# Patient Record
Sex: Female | Born: 1950 | Race: White | Hispanic: No | Marital: Married
Health system: Midwestern US, Community
[De-identification: ages and names within clinical notes are randomized; demographics above are authoritative.]

## PROBLEM LIST (undated history)

## (undated) DIAGNOSIS — I671 Cerebral aneurysm, nonruptured: Secondary | ICD-10-CM

## (undated) DIAGNOSIS — E119 Type 2 diabetes mellitus without complications: Secondary | ICD-10-CM

## (undated) DIAGNOSIS — M1990 Unspecified osteoarthritis, unspecified site: Secondary | ICD-10-CM

## (undated) DIAGNOSIS — G43909 Migraine, unspecified, not intractable, without status migrainosus: Secondary | ICD-10-CM

## (undated) DIAGNOSIS — J45909 Unspecified asthma, uncomplicated: Secondary | ICD-10-CM

## (undated) DIAGNOSIS — F419 Anxiety disorder, unspecified: Secondary | ICD-10-CM

## (undated) DIAGNOSIS — I10 Essential (primary) hypertension: Secondary | ICD-10-CM

## (undated) DIAGNOSIS — R011 Cardiac murmur, unspecified: Secondary | ICD-10-CM

## (undated) DIAGNOSIS — M810 Age-related osteoporosis without current pathological fracture: Secondary | ICD-10-CM

## (undated) DIAGNOSIS — R42 Dizziness and giddiness: Secondary | ICD-10-CM

## (undated) DIAGNOSIS — K449 Diaphragmatic hernia without obstruction or gangrene: Secondary | ICD-10-CM

## (undated) DIAGNOSIS — T7840XA Allergy, unspecified, initial encounter: Secondary | ICD-10-CM

## (undated) DIAGNOSIS — K219 Gastro-esophageal reflux disease without esophagitis: Secondary | ICD-10-CM

## (undated) DIAGNOSIS — H269 Unspecified cataract: Secondary | ICD-10-CM

## (undated) DIAGNOSIS — F32A Depression, unspecified: Secondary | ICD-10-CM

## (undated) DIAGNOSIS — I5189 Other ill-defined heart diseases: Secondary | ICD-10-CM

## (undated) DIAGNOSIS — I499 Cardiac arrhythmia, unspecified: Secondary | ICD-10-CM

## (undated) DIAGNOSIS — E785 Hyperlipidemia, unspecified: Secondary | ICD-10-CM

## (undated) DIAGNOSIS — G4733 Obstructive sleep apnea (adult) (pediatric): Secondary | ICD-10-CM

## (undated) DIAGNOSIS — G4730 Sleep apnea, unspecified: Secondary | ICD-10-CM

## (undated) DIAGNOSIS — E1140 Type 2 diabetes mellitus with diabetic neuropathy, unspecified: Secondary | ICD-10-CM

## (undated) DIAGNOSIS — K900 Celiac disease: Secondary | ICD-10-CM

## (undated) DIAGNOSIS — E1142 Type 2 diabetes mellitus with diabetic polyneuropathy: Secondary | ICD-10-CM

## (undated) DIAGNOSIS — Z1211 Encounter for screening for malignant neoplasm of colon: Secondary | ICD-10-CM

## (undated) DIAGNOSIS — G43719 Chronic migraine without aura, intractable, without status migrainosus: Secondary | ICD-10-CM

## (undated) DIAGNOSIS — Z1231 Encounter for screening mammogram for malignant neoplasm of breast: Secondary | ICD-10-CM

## (undated) DIAGNOSIS — E1165 Type 2 diabetes mellitus with hyperglycemia: Secondary | ICD-10-CM

## (undated) HISTORY — PX: CARPAL TUNNEL RELEASE: SHX101

## (undated) HISTORY — DX: Other ill-defined heart diseases: I51.89

## (undated) HISTORY — DX: Allergy, unspecified, initial encounter: T78.40XA

## (undated) HISTORY — PX: LAPAROSCOPY: SHX197

## (undated) HISTORY — DX: Unspecified asthma, uncomplicated: J45.909

## (undated) HISTORY — DX: Age-related osteoporosis without current pathological fracture: M81.0

## (undated) HISTORY — DX: Essential (primary) hypertension: I10

## (undated) HISTORY — PX: EYE SURGERY: SHX253

## (undated) HISTORY — DX: Unspecified cataract: H26.9

## (undated) HISTORY — PX: BRAIN SURGERY: SHX531

## (undated) HISTORY — DX: Diaphragmatic hernia without obstruction or gangrene: K44.9

## (undated) HISTORY — PX: BREAST BIOPSY: SHX20

## (undated) HISTORY — DX: Sleep apnea, unspecified: G47.30

## (undated) HISTORY — PX: WRIST SURGERY: SHX841

## (undated) HISTORY — PX: UTERINE FIBROID SURGERY: SHX826

## (undated) HISTORY — DX: Hyperlipidemia, unspecified: E78.5

## (undated) HISTORY — DX: Obstructive sleep apnea (adult) (pediatric): G47.33

## (undated) HISTORY — DX: Migraine, unspecified, not intractable, without status migrainosus: G43.909

## (undated) HISTORY — DX: Cardiac murmur, unspecified: R01.1

## (undated) HISTORY — PX: TUBAL LIGATION: SHX77

## (undated) HISTORY — PX: JOINT REPLACEMENT: SHX530

## (undated) HISTORY — PX: CATARACT EXTRACTION: SUR2

## (undated) HISTORY — DX: Type 2 diabetes mellitus with diabetic neuropathy, unspecified: E11.40

## (undated) HISTORY — DX: Celiac disease: K90.0

## (undated) HISTORY — PX: APPENDECTOMY: SHX54

## (undated) HISTORY — DX: Unspecified osteoarthritis, unspecified site: M19.90

## (undated) HISTORY — DX: Dizziness and giddiness: R42

## (undated) HISTORY — DX: Type 2 diabetes mellitus without complications: E11.9

---
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
## 2003-09-29 HISTORY — PX: ABDOMINAL HYSTERECTOMY: SHX81

## 2008-09-16 DIAGNOSIS — E1140 Type 2 diabetes mellitus with diabetic neuropathy, unspecified: Secondary | ICD-10-CM | POA: Insufficient documentation

## 2008-09-16 DIAGNOSIS — M810 Age-related osteoporosis without current pathological fracture: Secondary | ICD-10-CM | POA: Insufficient documentation

## 2012-11-06 DIAGNOSIS — S62109A Fracture of unspecified carpal bone, unspecified wrist, initial encounter for closed fracture: Secondary | ICD-10-CM | POA: Insufficient documentation

## 2013-12-31 DIAGNOSIS — M542 Cervicalgia: Secondary | ICD-10-CM | POA: Insufficient documentation

## 2014-01-26 DIAGNOSIS — R262 Difficulty in walking, not elsewhere classified: Secondary | ICD-10-CM | POA: Insufficient documentation

## 2014-01-26 DIAGNOSIS — M6281 Muscle weakness (generalized): Secondary | ICD-10-CM | POA: Insufficient documentation

## 2014-01-26 DIAGNOSIS — M5137 Other intervertebral disc degeneration, lumbosacral region: Secondary | ICD-10-CM | POA: Insufficient documentation

## 2015-09-27 DIAGNOSIS — J45909 Unspecified asthma, uncomplicated: Secondary | ICD-10-CM | POA: Insufficient documentation

## 2015-09-27 DIAGNOSIS — G43009 Migraine without aura, not intractable, without status migrainosus: Secondary | ICD-10-CM | POA: Insufficient documentation

## 2015-09-27 DIAGNOSIS — M1990 Unspecified osteoarthritis, unspecified site: Secondary | ICD-10-CM | POA: Insufficient documentation

## 2015-10-16 DIAGNOSIS — E785 Hyperlipidemia, unspecified: Secondary | ICD-10-CM | POA: Insufficient documentation

## 2015-10-24 DIAGNOSIS — I671 Cerebral aneurysm, nonruptured: Secondary | ICD-10-CM | POA: Insufficient documentation

## 2015-10-24 HISTORY — PX: CRANIOTOMY FOR ANEURYSM / VERTEBROBASILAR / CAROTID CIRCULATION: SUR331

## 2016-05-24 ENCOUNTER — Encounter

## 2016-05-28 ENCOUNTER — Inpatient Hospital Stay: Admit: 2016-05-28 | Payer: PRIVATE HEALTH INSURANCE | Attending: Family Medicine | Primary: Family Medicine

## 2016-05-28 DIAGNOSIS — Z1231 Encounter for screening mammogram for malignant neoplasm of breast: Secondary | ICD-10-CM

## 2016-10-07 ENCOUNTER — Encounter

## 2016-10-14 ENCOUNTER — Ambulatory Visit

## 2016-10-14 ENCOUNTER — Inpatient Hospital Stay: Admit: 2016-10-14 | Payer: PRIVATE HEALTH INSURANCE | Primary: Family Medicine

## 2016-10-14 ENCOUNTER — Encounter

## 2016-10-14 DIAGNOSIS — I671 Cerebral aneurysm, nonruptured: Secondary | ICD-10-CM

## 2016-10-14 MED ORDER — GADOBENATE DIMEGLUMINE 529 MG/ML (0.1 MMOL/0.2 ML) IV
529 mg/mL (0.1mmol/0.2mL) | Freq: Once | INTRAVENOUS | Status: AC
Start: 2016-10-14 — End: 2016-10-14
  Administered 2016-10-14: 13:00:00 via INTRAVENOUS

## 2017-09-02 NOTE — Other (Signed)
[facility]  Amb[facility] Surgery Unit  Pre-operative Instructions for Endo Procedures    Procedure Date  Thursday, September 18, 2017            Tentative Arrival Time 0700      1. On the day of your procedure, please report to the Ambulatory Surgery Unit Registration Desk and sign in at your designated time. The Ambulatory Surgery Unit is located in [facility] on the [place] side of the hospital across from the [facility] building. Please have all of your health insurance cards and a photo ID.    2. You must have someone with you to drive you home, as you should not drive a car for 24 hours following anesthesia. Please make arrangements for a responsible adult friend or family member to stay with you for at least the first 24 hours after your procedure.    3. Do not have anything to eat or drink (including water, gum, mints, coffee, juice) after 11:59 PM, Wednesday. This may not apply to medications prescribed by your physician.  (Please note below the special instructions with medications to take the morning of your procedure.)    4. If applicable, follow the clear liquid diet and bowel prep instructions provided by your physician's office. If you do not have this information, or have any questions, please contact your physician's office.     5. We recommend you do not drink any alcoholic beverages for 24 hours before and after your procedure.    6. Contact your surgeon???s office for instructions on the following medications: non-steroidal anti-inflammatory drugs (i.e. Advil, Aleve), vitamins, and supplements. (Some surgeon???s will want you to stop these medications prior to surgery and others may allow you to take them)   **If you are currently taking Plavix, Coumadin, Aspirin and/or other blood-thinning agents, contact your surgeon for instructions.** Your surgeon will partner with the physician prescribing these medications to  determine if it is safe to stop or if you need to continue taking. Please do not stop taking these medications without instructions from your surgeon.     7. In an effort to help prevent surgical site infection, we ask that you shower with an anti-bacterial soap (i.e. Dial or Safeguard) on the morning of your procedure. Do not apply any lotions, powders, or deodorants after showering.    8. Wear comfortable clothes. Wear glasses instead of contacts. Do not bring any jewelry or money (other than copays or fees as instructed). Do not wear make-up, particularly mascara, the morning of your procedure. Wear your hair loose or down, no ponytails, buns, bobby pins or clips. All body piercings must be removed.      9. You should understand that if you do not follow these instructions your procedure may be cancelled. If your physical condition changes (i.e. fever, cold or flu) please contact your surgeon as soon as possible.    10. It is important that you be on time. If a situation occurs where you may be late, or if you have any questions or problems, please call [phone].    11. Your procedure time may be subject to change. You will receive a phone call the day prior to confirm your arrival time.      Special Instructions:    Take all medications and inhalers, as prescribed, on the morning of surgery with a sip of water.    I understand a pre-operative phone call will be made to verify my procedure time. In  the event that I am not available, I give permission for a message to be left on my answering service and/or with another person?      yes    Preop instructions reviewed  Pt verbalized understanding.      ___________________      ___________________      ___________________  (Signature of Patient)          (Witness)                   (Date and Time)

## 2017-09-16 HISTORY — PX: ESOPHAGOGASTRODUODENOSCOPY: SHX1529

## 2017-09-16 HISTORY — PX: COLONOSCOPY: SHX174

## 2017-09-18 ENCOUNTER — Inpatient Hospital Stay: Payer: PRIVATE HEALTH INSURANCE

## 2017-09-18 LAB — HM COLONOSCOPY

## 2017-09-18 MED ORDER — LACTATED RINGERS IV
INTRAVENOUS | Status: DC
Start: 2017-09-18 — End: 2017-09-18

## 2017-09-18 MED ORDER — MEPERIDINE (PF) 50 MG/ML INJ SOLN
50 mg/ml | INTRAMUSCULAR | Status: DC | PRN
Start: 2017-09-18 — End: 2017-09-18

## 2017-09-18 MED ORDER — LIDOCAINE (PF) 20 MG/ML (2 %) IJ SOLN
20 mg/mL (2 %) | INTRAMUSCULAR | Status: DC | PRN
Start: 2017-09-18 — End: 2017-09-18
  Administered 2017-09-18: 13:00:00 via INTRAVENOUS

## 2017-09-18 MED ORDER — PROPOFOL 10 MG/ML IV EMUL
10 mg/mL | INTRAVENOUS | Status: AC
Start: 2017-09-18 — End: ?

## 2017-09-18 MED ORDER — PROPOFOL 10 MG/ML IV EMUL
10 mg/mL | INTRAVENOUS | Status: DC | PRN
Start: 2017-09-18 — End: 2017-09-18
  Administered 2017-09-18 (×8): via INTRAVENOUS

## 2017-09-18 MED ORDER — LIDOCAINE (PF) 10 MG/ML (1 %) IJ SOLN
10 mg/mL (1 %) | INTRAMUSCULAR | Status: DC | PRN
Start: 2017-09-18 — End: 2017-09-18

## 2017-09-18 MED ORDER — DIPHENHYDRAMINE HCL 50 MG/ML IJ SOLN
50 mg/mL | INTRAMUSCULAR | Status: DC | PRN
Start: 2017-09-18 — End: 2017-09-18

## 2017-09-18 MED ORDER — FENTANYL CITRATE (PF) 50 MCG/ML IJ SOLN
50 mcg/mL | INTRAMUSCULAR | Status: DC | PRN
Start: 2017-09-18 — End: 2017-09-18

## 2017-09-18 MED ORDER — LACTATED RINGERS IV
INTRAVENOUS | Status: DC
Start: 2017-09-18 — End: 2017-09-18
  Administered 2017-09-18: 12:00:00 via INTRAVENOUS

## 2017-09-18 MED ORDER — ONDANSETRON (PF) 4 MG/2 ML INJECTION
4 mg/2 mL | INTRAMUSCULAR | Status: DC | PRN
Start: 2017-09-18 — End: 2017-09-18

## 2017-09-18 MED ORDER — LIDOCAINE (PF) 20 MG/ML (2 %) IJ SOLN
20 mg/mL (2 %) | INTRAMUSCULAR | Status: AC
Start: 2017-09-18 — End: ?

## 2017-09-18 MED FILL — DIPRIVAN 10 MG/ML INTRAVENOUS EMULSION: 10 mg/mL | INTRAVENOUS | Qty: 20

## 2017-09-18 MED FILL — DIPRIVAN 10 MG/ML INTRAVENOUS EMULSION: 10 mg/mL | INTRAVENOUS | Qty: 100

## 2017-09-18 MED FILL — LACTATED RINGERS IV: INTRAVENOUS | Qty: 1000

## 2017-09-18 MED FILL — LIDOCAINE (PF) 20 MG/ML (2 %) IJ SOLN: 20 mg/mL (2 %) | INTRAMUSCULAR | Qty: 10

## 2017-09-18 NOTE — Procedures (Signed)
Procedures  by [name], MD at 09/18/17 [phone]                Author: [name], MD  Service: Gastroenterology  Author Type: Physician       Filed: 09/18/17 0847  Date of Service: 09/18/17 0846  Status: Signed          Editor: [name], MD (Physician)            Pre-procedure Diagnoses        1. Screening for colon cancer [Z12.11]                           Post-procedure Diagnoses        1. Polyp of descending colon, unspecified type [D12.4]        2. Diverticulosis of colon without hemorrhage [K57.30]        3. Internal hemorrhoids [K64.8]                           Procedures        1. Herbert Seta [[id number]]                                         Colonoscopy Procedure Note      Indications:   Screening colonoscopy      Referring Physician: Loraine Maple, DO   Anesthesia/Sedation: MAC anesthesia Propofol   Endoscopist:  Dr. Zane Herald      Procedure in Detail:   Informed consent was obtained for the procedure, including sedation.  Risks of perforation, hemorrhage, adverse drug reaction, and aspiration were discussed. The patient was placed in the left lateral decubitus position.  Based on the pre-procedure assessment,  including review of the patient's medical history, medications, allergies, and review of systems, she had been deemed to be an appropriate candidate  for moderate sedation; she was therefore sedated with the medications listed above.   The patient was monitored continuously with ECG tracing,  pulse oximetry, blood pressure monitoring, and direct observations.        A rectal examination was performed. The CFQ180AL was inserted into the rectum and advanced under direct vision to the cecum, which was identified by the ileocecal valve and appendiceal orifice.  The quality of the colonic preparation was adequate.  A  careful inspection was made as the colonoscope was withdrawn, including a retroflexed view of the rectum; findings and interventions  are described below.  Appropriate photodocumentation was obtained.      Findings:       1.  Scope advanced to the cecum.   2.  Preparation was adequate.   3.  Sessile 5 mm polyp in the descending colon s/p cold snare removal.   4.  Mild sigmoid diverticulosis.   5.  Grade 2 internal hemorrhoids.      Therapies:   As above      Specimen: Specimens were collected as described above and sent to pathology.       Complications: None were encountered during the procedure .       EBL: < 10 ml.      Recommendations:    -f/u path   -repeat Colonoscopy in 5 years   Signed By: [name], MD  September 18, 2017

## 2017-09-18 NOTE — Procedures (Signed)
Procedures  by [name], MD at 09/18/17 [phone]                Author: [name], MD  Service: Gastroenterology  Author Type: Physician       Filed: 09/18/17 0846  Date of Service: 09/18/17 0843  Status: Signed          Editor: [name], MD (Physician)            Pre-procedure Diagnoses        1. Barrett's esophagus without dysplasia [K22.70]                           Post-procedure Diagnoses        1. Barrett's esophagus determined by biopsy [K22.70]        2. Hiatal hernia [K44.9]        3. Gastritis without bleeding, unspecified chronicity, unspecified gastritis type [K29.70]                           Procedures        1. UPPER GI ENDOSCOPY,BIOPSY [[id number]]                                        Esophagogastroduodenoscopy Procedure Note         Kayla Shaffer   14-Aug-1951   [id number]      Indication:  Barrett's/esophageal ulcer       Endoscopist: [name], MD      Referring Provider:  Loraine Maple, DO      Sedation:  MAC anesthesia Propofol      Procedure Details:   After infomed consent was obtained for the procedure, with all risks and benefits of procedure explained the patient was taken to the endoscopy suite and placed in the left lateral decubitus position.  Following sequential administration of sedation as  per above, the endoscope was inserted into the mouth and advanced under direct vision to second portion of the duodenum.  A careful inspection was made as the gastroscope was withdrawn, including a retroflexed view of the proximal stomach; findings and  interventions are described below.        Findings:       Esophagus:    + Irregular Z line located at 35 cm from the incisors s/p Bx c/w previously diagnosed Barrett's Esophagus.  No nodules seen.   + Decreased LES tone with 4 cm hiatal hernia.   + S/P mid esophageal bx.      Stomach:    + There was erythema in the antrum s/p Bx.      Duodenum:    - The bulb and post bulbar mucosa is normal in appearance  to the second portion. The duodenal folds appeared normal.  Cold forceps biopsies to r/o celiac.       Therapies:  As above      Specimen: Specimens were collected as described and send to the laboratory.             Complications:   None were encountered during the procedure.      EBL: < 10 ml.            Recommendations:    -f/u path to confirm Barrett's   -repeat EGD in 3 years  [name], MD   09/18/2017  8:44 AM

## 2017-09-18 NOTE — Anesthesia Pre-Procedure Evaluation (Signed)
Anesthetic History   No history of anesthetic complications            Review of Systems / Medical History  Patient summary reviewed, nursing notes reviewed and pertinent labs reviewed    Pulmonary            Asthma : well controlled       Neuro/Psych         Headaches (migraines)    Comments: H/o intracranial aneurysm clipping 2017 Cardiovascular                  Exercise tolerance: >4 METS     GI/Hepatic/Renal     GERD      Hiatal hernia    Comments: Barrett's Endo/Other    Diabetes ( not on med)    Arthritis     Other Findings              Physical Exam    Airway  Mallampati: II  TM Distance: 4 - 6 cm  Neck ROM: normal range of motion   Mouth opening: Normal     Cardiovascular    Rhythm: regular  Rate: normal      Pertinent negatives: No murmur   Dental    Dentition: Bridges and Caps/crowns     Pulmonary  Breath sounds clear to auscultation               Abdominal  GI exam deferred       Other Findings            Anesthetic Plan    ASA: 2  Anesthesia type: total IV anesthesia and MAC          Induction: Intravenous  Anesthetic plan and risks discussed with: Patient

## 2017-09-18 NOTE — Other (Signed)
Kayla SheriffLucinda M Shaffer  06/14/1951  [id number]    Situation:  Verbal report given from: Z. Talmadge Coventryhornton, RN & K. Satterwhite,CRNA  Procedure: Procedure(s):  COLONOSCOPY/EGD  ESOPHAGOGASTRODUODENOSCOPY (EGD)  ESOPHAGOGASTRODUODENAL (EGD) BIOPSY  COLONOSCOPY WITH POLYPECTOMY    Background:    Preoperative diagnosis: BARRETTS/GERD/HIATAL HERNIA/LEFT UPPER QUADRANT PAIN    Postoperative diagnosis: Diverticulosis, internal hemorrhoids, descending colon polyp    Operator:  Dr. Teressa LowerManetas    Assistant(s): Circ-1: Aleatha Borerhornton, Zimeka L, RN  Circ-2: Evelena AsaMechuta, Laura J, RN  Scrub Tech-1: Birdena JubileeMuniz, Zulma L.    Specimens:   ID Type Source Tests Collected by Time Destination   1 : Stomach biopsy Preservative Stomach  Blake DivineManetas, Michael S, MD 09/18/2017 [phone] Pathology   2 : Duodenum biopsy Preservative Duodenum  Blake DivineManetas, Michael S, MD 09/18/2017 0820 Pathology   3 : GE junction biopsy Preservative GE Junction  Blake DivineManetas, Michael S, MD 09/18/2017 [phone] Pathology   4 : Mid esophagus biopsy Preservative Esophagus, Mid  Blake DivineManetas, Michael S, MD 09/18/2017 0825 Pathology   5 : Descending colon polyp Preservative Colon, Descending  Blake DivineManetas, Michael S, MD 09/18/2017 [phone] Pathology       Assessment:  Intra-procedure medications         Anesthesia gave intra-procedure sedation and medications, see anesthesia flow sheet     Intravenous fluids: LR@ KVO     Vital signs stable       Recommendation:    Permission to share finding with husband, Kayla SailsHarry.

## 2017-09-18 NOTE — Procedures (Signed)
Esophagogastroduodenoscopy Procedure Note      CASIDEE JANN  1951-07-14  [id number]    Indication:  Barrett's/esophageal ulcer     Endoscopist: [name], MD    Referring Provider:  Loraine Maple, DO    Sedation:  MAC anesthesia Propofol    Procedure Details:  After infomed consent was obtained for the procedure, with all risks and benefits of procedure explained the patient was taken to the endoscopy suite and placed in the left lateral decubitus position.  Following sequential administration of sedation as per above, the endoscope was inserted into the mouth and advanced under direct vision to second portion of the duodenum.  A careful inspection was made as the gastroscope was withdrawn, including a retroflexed view of the proximal stomach; findings and interventions are described below.      Findings:     Esophagus:   + Irregular Z line located at 35 cm from the incisors s/p Bx c/w previously diagnosed Barrett's Esophagus.  No nodules seen.  + Decreased LES tone with 4 cm hiatal hernia.  + S/P mid esophageal bx.    Stomach:   + There was erythema in the antrum s/p Bx.    Duodenum:   - The bulb and post bulbar mucosa is normal in appearance to the second portion. The duodenal folds appeared normal.  Cold forceps biopsies to r/o celiac.     Therapies:  As above    Specimen: Specimens were collected as described and send to the laboratory.           Complications:   None were encountered during the procedure.    EBL: < 10 ml.          Recommendations:   -f/u path to confirm Barrett's  -repeat EGD in 3 years      [name], MD  09/18/2017  8:44 AM

## 2017-09-18 NOTE — Other (Signed)
Permission received to review discharge instructions and discuss private health information with Kayla Shaffer, husband.

## 2017-09-18 NOTE — Other (Addendum)
0845: Patient is resting comfortably on left side. Respirations are even, nonlabored. Patients abdomen is soft/nontender. States that husband is here with her and it is okay to review all information with him. Patient states that she does not want anything to drink at this moment.     [id number]: Husband now at bedside. Patient remains sleeping. Is easy to arouse.     [id number]: Dc instructions are being reviewed at this time with patient and husband.     0905: Dc instructions have been reviewed with patient and husband. Both voice understanding. Neither have questions. Patient remains drowsy. Continues to state that she doesn't want anything to drink. Abdomen remains soft/nontender. Denies nausea.     0912: Dr. Teressa LowerManetas now at bedside. Patient states that she is ready to go home.     [id number]: Patient is stable for discharge. Respirations are even, nonlabored. Skin warm, pink, dry. Patient has all of their belongings they came with. Placed in wheelchair and brought to vehicle by staff.

## 2017-09-18 NOTE — H&P (Signed)
Gastroenterology Outpatient History and Physical    Patient: Kayla Shaffer    Physician: Blake DivineMichael S Dahlia Nifong, MD    Vital Signs: Blood pressure 165/82, pulse (!) 102, temperature 98.2 ??F (36.8 ??C), resp. rate 17, height 5\' 2"  (1.575 m), weight 91.2 kg (201 lb), SpO2 98 %.    Allergies:   Allergies   Allergen Reactions   ??? Ceftin [Cefuroxime Axetil] Anaphylaxis     Throat almost closed off   ??? Bactrim [Sulfamethoprim] Swelling     Throat swelling   ??? Codeine Swelling     throat   ??? Sulfa (Sulfonamide Antibiotics) Swelling     Swollen throat       Chief Complaint: Barrett's Esophagus, CRC screening    History of Present Illness: 67 yo F for EGD for chronic GERD and previously diagnosed Barrett's Esophagus.  Also due for CRC screening.    Justification for Procedure: above    History:  Past Medical History:   Diagnosis Date   ??? Asthma     last flare up over a year ago as stated 09/02/2017   ??? Barrett esophagus    ??? Diabetes (HCC)     no medications, just watching   ??? Hiatal hernia     sliding   ??? Migraine    ??? Osteoarthritis    ??? Osteoporosis       Past Surgical History:   Procedure Laterality Date   ??? HX APPENDECTOMY     ??? HX BREAST BIOPSY Left     Stereotactic biopsy. Benign. Long time ago. Clip.   ??? HX CARPAL TUNNEL RELEASE Bilateral    ??? HX CATARACT REMOVAL Bilateral 2016   ??? HX COLONOSCOPY     ??? HX ENDOSCOPY     ??? HX HYSTERECTOMY      a long time ago   ??? HX INTRACRANIAL ANEURYSM REPAIR Right 10/2015    behind right eye, [facility]   ??? HX OPEN REDUCTION INTERNAL FIXATION Right 1993    no hardware, wrist   ??? HX OPEN REDUCTION INTERNAL FIXATION Left 2014    wrist, no hardware      Social History     Socioeconomic History   ??? Marital status: MARRIED     Spouse name: Not on file   ??? Number of children: Not on file   ??? Years of education: Not on file   ??? Highest education level: Not on file   Tobacco Use   ??? Smoking status: Former Smoker   ??? Smokeless tobacco: Never Used    Substance and Sexual Activity   ??? Alcohol use: Yes     Comment: socially, mixed drink   ??? Drug use: No      Family History   Problem Relation Age of Onset   ??? Breast Cancer Paternal Grandmother         onset: 5870s       Medications:   Prior to Admission medications    Medication Sig Start Date End Date Taking? Authorizing Provider   atorvastatin (LIPITOR) 40 mg tablet Take 40 mg by mouth nightly.   Yes Provider, Historical   buPROPion XL (WELLBUTRIN XL) 300 mg XL tablet Take 300 mg by mouth every morning.   Yes Provider, Historical   DULoxetine (CYMBALTA) 20 mg capsule Take 20 mg by mouth nightly.   Yes Provider, Historical   nizatidine (AXID PO) Take 300 mg by mouth nightly.   Yes Provider, Historical  dexlansoprazole (DEXILANT) 60 mg CpDB capsule (delayed release) Take 1 Each by mouth daily.   Yes Provider, Historical   estradiol (VAGIFEM) 10 mcg tab vaginal tablet Insert 10 mcg into vagina every seven (7) days.   Yes Provider, Historical   topiramate (TOPAMAX) 25 mg tablet Take 25 mg by mouth two (2) times a day. One qam two qhs   Yes Provider, Historical   denosumab (PROLIA) 60 mg/mL injection 60 mg by SubCUTAneous route every 6 months.   Yes Provider, Historical   ZOLMitriptan (ZOMIG ZMT) 5 mg disintegrating tablet Take 5 mg by mouth as needed for Migraine.   Yes Provider, Historical   fluticasone (FLOVENT HFA) 110 mcg/actuation inhaler Take 2 Puffs by inhalation every twelve (12) hours.   Yes Provider, Historical   albuterol (PROVENTIL HFA) 90 mcg/actuation inhaler Take 2 Puffs by inhalation as needed for Wheezing.   Yes Provider, Historical       Physical Exam:   General: alert, no distress   HEENT: Head: Normocephalic, no lesions, without obvious abnormality.   Heart: regular rate and rhythm, S1, S2 normal, no murmur, click, rub or gallop   Lungs: chest clear, no wheezing, rales, normal symmetric air entry   Abdominal: soft, nontender, nondistended, + BS   Neurological: Grossly normal    Extremities: extremities normal, atraumatic, no cyanosis or edema     Findings/Diagnosis: Barrett's, Screening colonoscopy    Plan of Care/Planned Procedure: EGD & Colonoscopy    Signed By: Blake Divine, MD     September 18, 2017

## 2017-09-18 NOTE — Procedures (Signed)
Colonoscopy Procedure Note    Indications:   Screening colonoscopy    Referring Physician: Loraine Maple, DO  Anesthesia/Sedation: MAC anesthesia Propofol  Endoscopist:  Dr. Zane Herald    Procedure in Detail:  Informed consent was obtained for the procedure, including sedation.  Risks of perforation, hemorrhage, adverse drug reaction, and aspiration were discussed. The patient was placed in the left lateral decubitus position.  Based on the pre-procedure assessment, including review of the patient's medical history, medications, allergies, and review of systems, she had been deemed to be an appropriate candidate for moderate sedation; she was therefore sedated with the medications listed above.   The patient was monitored continuously with ECG tracing, pulse oximetry, blood pressure monitoring, and direct observations.      A rectal examination was performed. The CFQ180AL was inserted into the rectum and advanced under direct vision to the cecum, which was identified by the ileocecal valve and appendiceal orifice.  The quality of the colonic preparation was adequate.  A careful inspection was made as the colonoscope was withdrawn, including a retroflexed view of the rectum; findings and interventions are described below.  Appropriate photodocumentation was obtained.    Findings:     1.  Scope advanced to the cecum.  2.  Preparation was adequate.  3.  Sessile 5 mm polyp in the descending colon s/p cold snare removal.  4.  Mild sigmoid diverticulosis.  5.  Grade 2 internal hemorrhoids.    Therapies:  As above    Specimen: Specimens were collected as described above and sent to pathology.     Complications: None were encountered during the procedure.     EBL: < 10 ml.    Recommendations:   -f/u path  -repeat Colonoscopy in 5 years  Signed By: [name], MD                        September 18, 2017

## 2017-09-18 NOTE — Anesthesia Post-Procedure Evaluation (Signed)
Procedure(s):  COLONOSCOPY/EGD  ESOPHAGOGASTRODUODENOSCOPY (EGD)  ESOPHAGOGASTRODUODENAL (EGD) BIOPSY  COLONOSCOPY WITH POLYPECTOMY.    Anesthesia Post Evaluation      Multimodal analgesia: multimodal analgesia not used between 6 hours prior to anesthesia start to PACU discharge  Patient location during evaluation: bedside  Patient participation: complete - patient participated  Level of consciousness: awake and alert  Pain score: 0  Airway patency: patent  Anesthetic complications: no  Cardiovascular status: acceptable  Respiratory status: acceptable  Hydration status: acceptable  Post anesthesia nausea and vomiting:  none      Visit Vitals  BP 159/85 (BP 1 Location: Left arm, BP Patient Position: At rest)   Pulse 87   Temp 36.6 ??C (97.9 ??F)   Resp 18   Ht 5\' 2"  (1.575 m)   Wt 91.2 kg (201 lb)   SpO2 97%   BMI 36.76 kg/m??

## 2017-09-18 NOTE — Other (Signed)
Gave pt warm blanket

## 2017-09-18 NOTE — Other (Signed)
Endoscope was pre-cleaned at bedside immediately by Z.Muniz, ST.

## 2017-09-19 MED FILL — DIPRIVAN 10 MG/ML INTRAVENOUS EMULSION: 10 mg/mL | INTRAVENOUS | Qty: 20

## 2017-09-19 MED FILL — LIDOCAINE (PF) 20 MG/ML (2 %) IJ SOLN: 20 mg/mL (2 %) | INTRAMUSCULAR | Qty: 5

## 2017-12-08 ENCOUNTER — Encounter

## 2017-12-11 ENCOUNTER — Inpatient Hospital Stay: Admit: 2017-12-11 | Payer: PRIVATE HEALTH INSURANCE | Attending: Specialist | Primary: Family Medicine

## 2017-12-11 DIAGNOSIS — K219 Gastro-esophageal reflux disease without esophagitis: Secondary | ICD-10-CM

## 2017-12-11 MED ORDER — TECHNETIUM TC 99M SULFUR COLLOID
Freq: Once | Status: AC
Start: 2017-12-11 — End: 2017-12-11
  Administered 2017-12-11: 15:00:00 via ORAL

## 2018-04-26 ENCOUNTER — Emergency Department: Admit: 2018-04-26 | Payer: PRIVATE HEALTH INSURANCE | Primary: Family Medicine

## 2018-04-26 ENCOUNTER — Inpatient Hospital Stay
Admit: 2018-04-26 | Discharge: 2018-04-26 | Disposition: A | Discharge status: Home / Self Care | Payer: PRIVATE HEALTH INSURANCE | Attending: Emergency Medicine

## 2018-04-26 DIAGNOSIS — S20211A Contusion of right front wall of thorax, initial encounter: Secondary | ICD-10-CM

## 2018-04-26 LAB — CBC WITH AUTO DIFFERENTIAL
Basophils %: 1 % (ref 0–1)
Basophils Absolute: 0 10*3/uL (ref 0.0–0.1)
Eosinophils %: 1 % (ref 0–7)
Eosinophils Absolute: 0.1 10*3/uL (ref 0.0–0.4)
Granulocyte Absolute Count: 0 10*3/uL (ref 0.00–0.04)
Hematocrit: 47.2 % — ABNORMAL HIGH (ref 35.0–47.0)
Hemoglobin: 15.6 g/dL (ref 11.5–16.0)
Immature Granulocytes: 0 % (ref 0.0–0.5)
Lymphocytes %: 29 % (ref 12–49)
Lymphocytes Absolute: 1.9 10*3/uL (ref 0.8–3.5)
MCH: 31.6 PG (ref 26.0–34.0)
MCHC: 33.1 g/dL (ref 30.0–36.5)
MCV: 95.7 FL (ref 80.0–99.0)
MPV: 10.1 FL (ref 8.9–12.9)
Monocytes %: 7 % (ref 5–13)
Monocytes Absolute: 0.5 10*3/uL (ref 0.0–1.0)
NRBC Absolute: 0 10*3/uL (ref 0.00–0.01)
Neutrophils %: 62 % (ref 32–75)
Neutrophils Absolute: 4.1 10*3/uL (ref 1.8–8.0)
Nucleated RBCs: 0 PER 100 WBC
Platelets: 287 10*3/uL (ref 150–400)
RBC: 4.93 M/uL (ref 3.80–5.20)
RDW: 12.9 % (ref 11.5–14.5)
WBC: 6.5 10*3/uL (ref 3.6–11.0)

## 2018-04-26 LAB — COMPREHENSIVE METABOLIC PANEL
ALT: 54 U/L (ref 12–78)
AST: 21 U/L (ref 15–37)
Albumin/Globulin Ratio: 1.3 (ref 1.1–2.2)
Albumin: 3.9 g/dL (ref 3.5–5.0)
Alkaline Phosphatase: 98 U/L (ref 45–117)
Anion Gap: 7 mmol/L (ref 5–15)
BUN: 11 MG/DL (ref 6–20)
Bun/Cre Ratio: 12 (ref 12–20)
CO2: 25 mmol/L (ref 21–32)
Calcium: 8.8 MG/DL (ref 8.5–10.1)
Chloride: 108 mmol/L (ref 97–108)
Creatinine: 0.95 MG/DL (ref 0.55–1.02)
EGFR IF NonAfrican American: 59 mL/min/{1.73_m2} — ABNORMAL LOW (ref >60)
GFR African American: >60 mL/min/{1.73_m2} (ref >60)
Globulin: 3.1 g/dL (ref 2.0–4.0)
Glucose: 190 mg/dL — ABNORMAL HIGH (ref 65–100)
Potassium: 3.7 mmol/L (ref 3.5–5.1)
Sodium: 140 mmol/L (ref 136–145)
Total Bilirubin: 0.7 MG/DL (ref 0.2–1.0)
Total Protein: 7 g/dL (ref 6.4–8.2)

## 2018-04-26 LAB — URINALYSIS W/ REFLEX CULTURE
Bilirubin, Urine: NEGATIVE
Bilirubin: NEGATIVE
Blood, Urine: NEGATIVE
Blood: NEGATIVE
Glucose, Ur: NEGATIVE mg/dL
Glucose: NEGATIVE mg/dL
Ketone: NEGATIVE mg/dL
Ketones, Urine: NEGATIVE mg/dL
Nitrite, Urine: NEGATIVE
Nitrites: NEGATIVE
Protein, UA: NEGATIVE mg/dL
Protein: NEGATIVE mg/dL
Specific Gravity, UA: 1.021 (ref 1.003–1.030)
Specific gravity: 1.021 (ref 1.003–1.030)
Urobilinogen, UA, POCT: 1 EU/dL (ref 0.2–1.0)
Urobilinogen: 1 EU/dL (ref 0.2–1.0)
pH (UA): 6.5 (ref 5.0–8.0)
pH, UA: 6.5 (ref 5.0–8.0)

## 2018-04-26 LAB — TROPONIN: Troponin I: <0.05 ng/mL (ref <0.05)

## 2018-04-26 LAB — CBC WITH AUTOMATED DIFF
ABS. BASOPHILS: 0 10*3/uL (ref 0.0–0.1)
ABS. EOSINOPHILS: 0.1 10*3/uL (ref 0.0–0.4)
ABS. IMM. GRANS.: 0 10*3/uL (ref 0.00–0.04)
ABS. LYMPHOCYTES: 1.9 10*3/uL (ref 0.8–3.5)
ABS. MONOCYTES: 0.5 10*3/uL (ref 0.0–1.0)
ABS. NEUTROPHILS: 4.1 10*3/uL (ref 1.8–8.0)
ABSOLUTE NRBC: 0 10*3/uL (ref 0.00–0.01)
BASOPHILS: 1 % (ref 0–1)
EOSINOPHILS: 1 % (ref 0–7)
HCT: 47.2 % — ABNORMAL HIGH (ref 35.0–47.0)
HGB: 15.6 g/dL (ref 11.5–16.0)
IMMATURE GRANULOCYTES: 0 % (ref 0.0–0.5)
LYMPHOCYTES: 29 % (ref 12–49)
MCH: 31.6 PG (ref 26.0–34.0)
MCHC: 33.1 g/dL (ref 30.0–36.5)
MCV: 95.7 FL (ref 80.0–99.0)
MONOCYTES: 7 % (ref 5–13)
MPV: 10.1 FL (ref 8.9–12.9)
NEUTROPHILS: 62 % (ref 32–75)
NRBC: 0 PER 100 WBC
PLATELET: 287 10*3/uL (ref 150–400)
RBC: 4.93 M/uL (ref 3.80–5.20)
RDW: 12.9 % (ref 11.5–14.5)
WBC: 6.5 10*3/uL (ref 3.6–11.0)

## 2018-04-26 LAB — TROPONIN I: Troponin-I, Qt.: <0.05 ng/mL (ref <0.05)

## 2018-04-26 LAB — METABOLIC PANEL, COMPREHENSIVE
A-G Ratio: 1.3 (ref 1.1–2.2)
ALT (SGPT): 54 U/L (ref 12–78)
AST (SGOT): 21 U/L (ref 15–37)
Albumin: 3.9 g/dL (ref 3.5–5.0)
Alk. phosphatase: 98 U/L (ref 45–117)
Anion gap: 7 mmol/L (ref 5–15)
BUN/Creatinine ratio: 12 (ref 12–20)
BUN: 11 MG/DL (ref 6–20)
Bilirubin, total: 0.7 MG/DL (ref 0.2–1.0)
CO2: 25 mmol/L (ref 21–32)
Calcium: 8.8 MG/DL (ref 8.5–10.1)
Chloride: 108 mmol/L (ref 97–108)
Creatinine: 0.95 MG/DL (ref 0.55–1.02)
GFR est AA: >60 mL/min/{1.73_m2} (ref >60)
GFR est non-AA: 59 mL/min/{1.73_m2} — ABNORMAL LOW (ref >60)
Globulin: 3.1 g/dL (ref 2.0–4.0)
Glucose: 190 mg/dL — ABNORMAL HIGH (ref 65–100)
Potassium: 3.7 mmol/L (ref 3.5–5.1)
Protein, total: 7 g/dL (ref 6.4–8.2)
Sodium: 140 mmol/L (ref 136–145)

## 2018-04-26 MED ORDER — IOPAMIDOL 76 % IV SOLN
370 mg iodine /mL (76 %) | INTRAVENOUS | Status: AC
Start: 2018-04-26 — End: 2018-04-26
  Administered 2018-04-26: 18:00:00

## 2018-04-26 MED ORDER — SODIUM CHLORIDE 0.9% BOLUS IV
0.9 % | Freq: Once | INTRAVENOUS | Status: AC
Start: 2018-04-26 — End: 2018-04-26
  Administered 2018-04-26: 17:00:00 via INTRAVENOUS

## 2018-04-26 MED ORDER — NITROFURANTOIN (25% MACROCRYSTAL FORM) 100 MG CAP
100 mg | ORAL_CAPSULE | Freq: Two times a day (BID) | ORAL | 0 refills | Status: AC
Start: 2018-04-26 — End: 2018-05-03

## 2018-04-26 MED ORDER — KETOROLAC TROMETHAMINE 30 MG/ML INJECTION
30 mg/mL (1 mL) | INTRAMUSCULAR | Status: AC
Start: 2018-04-26 — End: 2018-04-26
  Administered 2018-04-26: 17:00:00 via INTRAVENOUS

## 2018-04-26 MED ORDER — NITROFURANTOIN (25% MACROCRYSTAL FORM) 100 MG CAP
100 mg | ORAL | Status: AC
Start: 2018-04-26 — End: 2018-04-26
  Administered 2018-04-26: 19:00:00 via ORAL

## 2018-04-26 MED FILL — NITROFURANTOIN (25% MACROCRYSTAL FORM) 100 MG CAP: 100 mg | ORAL | Qty: 1

## 2018-04-26 MED FILL — ISOVUE-370  76 % INTRAVENOUS SOLUTION: 370 mg iodine /mL (76 %) | INTRAVENOUS | Qty: 100

## 2018-04-26 MED FILL — KETOROLAC TROMETHAMINE 30 MG/ML INJECTION: 30 mg/mL (1 mL) | INTRAMUSCULAR | Qty: 1

## 2018-04-26 MED FILL — SODIUM CHLORIDE 0.9 % IV: INTRAVENOUS | Qty: 1000

## 2018-04-26 NOTE — ED Notes (Signed)
Dhruvi Dave PA reviewed discharge instructions with the patient.  The patient verbalized understanding.  All questions and concerns were addressed.  The patient declined a wheelchair and is discharged ambulatory in the care of family members with instructions and prescriptions in hand.  Pt is alert and oriented x 4.  Respirations are clear and unlabored.

## 2018-04-26 NOTE — ED Provider Notes (Signed)
ED Provider Notes by Cherre Robins, PA at 04/26/18 1435                Author: Cherre Robins, PA  Service: Emergency Medicine  Author Type: Physician Assistant       Filed: 04/26/18 1447  Date of Service: 04/26/18 1435  Status: Attested           Editor: Cherre Robins, PA (Physician Assistant)  Cosigner: Katha Shaffer I, MD at 04/27/18 2052          Attestation signed by Alex Gardener, MD at 04/27/18 2052          8:52 PM   I was personally available for consultation in the emergency department.  I have reviewed the chart and agree with the documentation recorded by the [facility], including the assessment, treatment plan, and disposition.   Alex Gardener, MD                                    EMERGENCY DEPARTMENT HISTORY AND PHYSICAL EXAM           Date: 04/26/2018   Patient Name: Kayla Shaffer        History of Presenting Illness        Please note that this dictation was completed with Dragon, the computer voice recognition software.  Quite often unanticipated grammatical, syntax, homophones, and other interpretive errors are inadvertently transcribed by the computer software.  Please  disregard these errors.  Please excuse any errors that have escaped final proofreading.          Chief Complaint       Patient presents with        ?  Rib Injury             pt reports she fell on Friday, has pain to right side of body, ribs and shoulder        ?  Shoulder Injury           History Provided By: Patient      HPI: Kayla Shaffer,  67 y.o. female with PMHx  For morbid obesity, diabetes, celiac disease,, presents to the  ED with cc of right rib pain, nausea, after patient had a fall 2 days ago.  Patient states that she was getting something out of her car when she fell face forward onto the pavement.  Patient states that she has history of wrist fracture so she did not  try to stop her fall with her hands but she is tucked her hands across her chest.  Patient states she also did not hit her head kept her  neck up while falling.      Patient denies any chest pain, dizziness, shortness of breath before during and after the fall.        She denies any abdominal pain but states that she has been able to eat and drink normally but does admit to some mild nausea  That started today but does not know if that is from the pain or not       Patient also admits to urinary frequency.      Patient denies any dysuria, urinary urgency, fevers, chills, , vomiting, chest pain, shortness of breath, headache, rash, diarrhea, sweating or weight loss, hematochezia, saddle anesthesia, loss of  bowel/bladder function, hematuria  There are no other complaints, changes, or physical findings at this time.        Social History          Tobacco Use         ?  Smoking status:  Former Smoker     ?  Smokeless tobacco:  Never Used       Substance Use Topics         ?  Alcohol use:  Yes             Comment: socially, mixed drink         ?  Drug use:  No             Allergies        Allergen  Reactions         ?  Ceftin [Cefuroxime Axetil]  Anaphylaxis             Throat almost closed off         ?  Bactrim [Sulfamethoprim]  Swelling             Throat swelling         ?  Codeine  Swelling             throat         ?  Sulfa (Sulfonamide Antibiotics)  Swelling             Swollen throat              PCP: Loraine Maple, DO        No current facility-administered medications on file prior to encounter.           Current Outpatient Medications on File Prior to Encounter          Medication  Sig  Dispense  Refill           ?  pantoprazole (PROTONIX) 40 mg tablet  Take 40 mg by mouth daily.         ?  atorvastatin (LIPITOR) 40 mg tablet  Take 40 mg by mouth nightly.         ?  buPROPion XL (WELLBUTRIN XL) 300 mg XL tablet  Take 300 mg by mouth every morning.         ?  DULoxetine (CYMBALTA) 20 mg capsule  Take 20 mg by mouth nightly.         ?  nizatidine (AXID PO)  Take 300 mg by mouth nightly.         ?  dexlansoprazole (DEXILANT) 60 mg  CpDB capsule (delayed release)  Take 1 Each by mouth daily.         ?  estradiol (VAGIFEM) 10 mcg tab vaginal tablet  Insert 10 mcg into vagina every seven (7) days.         ?  topiramate (TOPAMAX) 25 mg tablet  Take 25 mg by mouth two (2) times a day. One qam two qhs         ?  denosumab (PROLIA) 60 mg/mL injection  60 mg by SubCUTAneous route every 6 months.         ?  ZOLMitriptan (ZOMIG ZMT) 5 mg disintegrating tablet  Take 5 mg by mouth as needed for Migraine.         ?  fluticasone (FLOVENT HFA) 110 mcg/actuation inhaler  Take 2 Puffs by inhalation every twelve (12) hours.               ?  albuterol (PROVENTIL HFA) 90 mcg/actuation inhaler  Take 2 Puffs by inhalation as needed for Wheezing.                 Past History        Past Medical History:     Past Medical History:        Diagnosis  Date         ?  Asthma            last flare up over a year ago as stated 09/02/2017         ?  Barrett esophagus       ?  Diabetes ([place])            no medications, just watching         ?  Hiatal hernia            sliding         ?  Migraine       ?  Osteoarthritis           ?  Osteoporosis             Past Surgical History:     Past Surgical History:         Procedure  Laterality  Date          ?  COLONOSCOPY  N/A  09/18/2017          COLONOSCOPY/EGD performed by [name], MD at [facility]          ?  HX APPENDECTOMY         ?  HX BREAST BIOPSY  Left            Stereotactic biopsy. Benign. Long time ago. Clip.          ?  HX CARPAL TUNNEL RELEASE  Bilateral       ?  HX CATARACT REMOVAL  Bilateral  2016     ?  HX COLONOSCOPY         ?  HX ENDOSCOPY         ?  HX HYSTERECTOMY              a long time ago          ?  HX INTRACRANIAL ANEURYSM REPAIR  Right  10/2015          behind right eye, clip, done at [facility], safe for MRI          ?  HX OPEN REDUCTION INTERNAL FIXATION  Right  1993          no hardware, wrist          ?  HX OPEN REDUCTION INTERNAL FIXATION  Left  2014          wrist, no hardware            Family History:     Family History         Problem  Relation  Age of Onset          ?  Breast Cancer  Paternal Grandmother                onset: 82s           Social History:     Social History          Tobacco Use         ?  Smoking status:  Former Smoker     ?  Smokeless tobacco:  Never Used       Substance Use Topics         ?  Alcohol use:  Yes             Comment: socially, mixed drink         ?  Drug use:  No           Allergies:     Allergies        Allergen  Reactions         ?  Ceftin [Cefuroxime Axetil]  Anaphylaxis             Throat almost closed off         ?  Bactrim [Sulfamethoprim]  Swelling             Throat swelling         ?  Codeine  Swelling             throat         ?  Sulfa (Sulfonamide Antibiotics)  Swelling             Swollen throat                Review of Systems     Review of Systems    Constitutional: Negative.  Negative for chills and fever.    HENT: Negative.     Eyes: Negative.     Respiratory: Negative.  Negative for shortness of breath.     Cardiovascular: Negative.  Negative for chest pain.    Gastrointestinal: Negative.  Negative for abdominal pain, diarrhea, nausea and vomiting.    Endocrine: Negative.     Genitourinary: Negative.     Musculoskeletal: Positive for arthralgias.    Skin: Negative.     Allergic/Immunologic: Negative.     Neurological: Negative.  Negative for headaches.    Hematological: Negative.     Psychiatric/Behavioral: Negative.     All other systems reviewed and are negative.           Physical Exam     Physical Exam    Constitutional: She is oriented to person, place, and time. She appears well-developed and well-nourished. No distress.   Pleasant, obese     HENT:    Head: Normocephalic and atraumatic.   Right Ear: External ear normal. No hemotympanum.   Left Ear: External ear normal. No hemotympanum.    Nose: Nose normal. No epistaxis.    Mouth/Throat: Uvula is midline, oropharynx is clear and moist and mucous membranes are normal. No oropharyngeal  exudate.   No bilateral hemotympanum.     Eyes: Pupils are equal, round, and reactive to light. Conjunctivae and EOM are normal.    Neck: Trachea normal, normal range of motion and full passive range of motion without pain. Neck supple. No spinous process tenderness and no muscular tenderness present. No neck rigidity. No tracheal deviation present.    Cardiovascular: Normal rate, regular rhythm, normal heart sounds and intact distal pulses.    Pulmonary/Chest: Effort normal and breath sounds normal. No respiratory distress. She has no wheezes. She exhibits tenderness  (right lower ribs).    Abdominal: Soft. Bowel sounds are normal. She exhibits no distension. There is tenderness  (right upper quadrant/right upper ribs). There is no rebound, no CVA tenderness, no tenderness at McBurney's point and negative Murphy's sign.   Musculoskeletal: Normal range of motion. She exhibits no edema, tenderness  or deformity.  Right shoulder: Normal.        Left shoulder: Normal.        Right elbow: Normal.       Left elbow: Normal.        Right wrist: Normal.        Left wrist: Normal.        Right hip: Normal.        Left  hip: Normal.        Right knee: Normal.        Left knee: Normal.        Right ankle: Normal.        Left ankle: Normal.        Cervical back: She exhibits normal range of motion, no tenderness, no bony tenderness, no swelling, no  edema, no deformity, no laceration, no pain, no spasm and normal pulse.        Thoracic back: Normal. She exhibits normal range of motion, no tenderness, no bony tenderness, no swelling, no edema, no deformity, no laceration, no pain, no spasm and  normal pulse.        Lumbar back: Normal. She exhibits normal range of motion, no tenderness, no bony tenderness, no swelling, no edema, no deformity, no laceration, no pain, no spasm and normal pulse.        Right lower leg: Normal.         Left lower leg: Normal.        Right foot: Normal.        Left foot: Normal.    Lymphadenopathy:     She has no cervical adenopathy.    Neurological: She is alert and oriented to person, place, and time. She has normal strength and normal reflexes. She is not disoriented. She displays no atrophy and no tremor. No cranial nerve deficit or sensory deficit. She exhibits normal muscle tone.  She displays a negative Romberg sign. She displays no seizure activity. Coordination and gait normal.   Intact finger to nose, no pronator drift      Skin: Skin is warm and dry. She is not diaphoretic. No pallor.   Psychiatric: She has a normal mood and affect. Her behavior  is normal. Judgment and thought content normal.    Nursing note and vitals reviewed.           Diagnostic Study Results        Labs -         Recent Results (from the past 12 hour(s))     CBC WITH AUTOMATED DIFF          Collection Time: 04/26/18 12:13 PM         Result  Value  Ref Range            WBC  6.5  3.6 - 11.0 K/uL       RBC  4.93  3.80 - 5.20 M/uL       HGB  15.6  11.5 - 16.0 g/dL       HCT  47.2 (H)  35.0 - 47.0 %       MCV  95.7  80.0 - 99.0 FL       MCH  31.6  26.0 - 34.0 PG       MCHC  33.1  30.0 - 36.5 g/dL       RDW  12.9  11.5 - 14.5 %       PLATELET  287  150 - 400 K/uL  MPV  10.1  8.9 - 12.9 FL       NRBC  0.0  0 PER 100 WBC       ABSOLUTE NRBC  0.00  0.00 - 0.01 K/uL       NEUTROPHILS  62  32 - 75 %       LYMPHOCYTES  29  12 - 49 %       MONOCYTES  7  5 - 13 %       EOSINOPHILS  1  0 - 7 %       BASOPHILS  1  0 - 1 %       IMMATURE GRANULOCYTES  0  0.0 - 0.5 %       ABS. NEUTROPHILS  4.1  1.8 - 8.0 K/UL       ABS. LYMPHOCYTES  1.9  0.8 - 3.5 K/UL       ABS. MONOCYTES  0.5  0.0 - 1.0 K/UL       ABS. EOSINOPHILS  0.1  0.0 - 0.4 K/UL       ABS. BASOPHILS  0.0  0.0 - 0.1 K/UL       ABS. IMM. GRANS.  0.0  0.00 - 0.04 K/UL       DF  AUTOMATED          METABOLIC PANEL, COMPREHENSIVE          Collection Time: 04/26/18 12:13 PM         Result  Value  Ref Range            Sodium  140  136 - 145 mmol/L       Potassium  3.7   3.5 - 5.1 mmol/L       Chloride  108  97 - 108 mmol/L       CO2  25  21 - 32 mmol/L       Anion gap  7  5 - 15 mmol/L       Glucose  190 (H)  65 - 100 mg/dL       BUN  11  6 - 20 MG/DL       Creatinine  0.95  0.55 - 1.02 MG/DL       BUN/Creatinine ratio  12  12 - 20         GFR est AA  >60  >60 ml/min/1.31m       GFR est non-AA  59 (L)  >60 ml/min/1.741m      Calcium  8.8  8.5 - 10.1 MG/DL       Bilirubin, total  0.7  0.2 - 1.0 MG/DL       ALT (SGPT)  54  12 - 78 U/L       AST (SGOT)  21  15 - 37 U/L       Alk. phosphatase  98  45 - 117 U/L       Protein, total  7.0  6.4 - 8.2 g/dL       Albumin  3.9  3.5 - 5.0 g/dL       Globulin  3.1  2.0 - 4.0 g/dL       A-G Ratio  1.3  1.1 - 2.2         TROPONIN I          Collection Time: 04/26/18 12:13 PM         Result  Value  Ref Range  Troponin-I, Qt.  <0.05  <0.05 ng/mL       EKG, 12 LEAD, INITIAL          Collection Time: 04/26/18 12:13 PM         Result  Value  Ref Range            Ventricular Rate  82  BPM       Atrial Rate  82  BPM       P-R Interval  154  ms       QRS Duration  68  ms       Q-T Interval  384  ms       QTC Calculation (Bezet)  448  ms       Calculated P Axis  46  degrees       Calculated R Axis  -10  degrees       Calculated T Axis  89  degrees       Diagnosis                 Normal sinus rhythm   Minimal voltage criteria for LVH, may be normal variant   T wave abnormality, consider lateral ischemia   No previous ECGs available          URINALYSIS W/ REFLEX CULTURE          Collection Time: 04/26/18 12:21 PM         Result  Value  Ref Range            Color  YELLOW/STRAW          Appearance  CLOUDY (A)  CLEAR         Specific gravity  1.021  1.003 - 1.030         pH (UA)  6.5  5.0 - 8.0         Protein  NEGATIVE   NEG mg/dL       Glucose  NEGATIVE   NEG mg/dL       Ketone  NEGATIVE   NEG mg/dL       Bilirubin  NEGATIVE   NEG         Blood  NEGATIVE   NEG         Urobilinogen  1.0  0.2 - 1.0 EU/dL       Nitrites  NEGATIVE   NEG          Leukocyte Esterase  SMALL (A)  NEG         WBC  5-10  0 - 4 /hpf       RBC  0-5  0 - 5 /hpf       Epithelial cells  MANY (A)  FEW /lpf       Bacteria  4+ (A)  NEG /hpf       UA:UC IF INDICATED  URINE CULTURE ORDERED (A)  CNI              Mucus  TRACE (A)  NEG /lpf           Radiologic Studies -      CT HEAD WO CONT       Final Result     IMPRESSION:        No acute intracranial abnormality. 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                 CT SPINE  CERV WO CONT       Final Result     IMPRESSION:      No acute abnormality. Left greater than right facet arthrosis with left     foraminal stenosis at C4-5 and C5-6. Grade 1 anterolisthesis at C4-5.                 CT ABD PELV W CONT       Final Result     IMPRESSION:      No acute abnormality in the abdomen or pelvis. No lower rib fracture. Hepatic     steatosis. Small hiatal hernia.            XR RIBS RT W PA CXR MIN 3 V       Final Result     IMPRESSION:     1. Osteopenia with chronic appearing right-sided rib fractures. An acute     fracture is not demonstrated.                 CT Results   (Last 48 hours)                                    04/26/18 1355    CT HEAD WO CONT  Final result            Impression:    IMPRESSION:       No acute intracranial abnormality. 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                              Narrative:    EXAM:  CT head without contrast             INDICATION: Fall on Friday. Right-sided pain.             COMPARISON: MRI brain 10/14/2016.             TECHNIQUE: Axial noncontrast head CT from foramen magnum to vertex. Coronal and      sagittal reformatted images were obtained. CT dose reduction was achieved      through use of a standardized protocol tailored for this examination and      automatic exposure control for dose modulation.             FINDINGS:  The ventricles and sulci are age-appropriate without hydrocephalus.       There is no mass effect or midline shift. There is no intracranial hemorrhage or      extra-axial fluid collection. Right MCA aneurysm clips are noted with stable      right frontal encephalomalacia. The basal cisterns are patent.             A right frontal craniotomy is again noted. The visualized paranasal sinuses and      mastoid air cells are clear.                               04/26/18 1355    CT SPINE CERV WO CONT  Final result            Impression:    IMPRESSION:       No acute abnormality. Left greater than right facet arthrosis with left      foraminal  stenosis at C4-5 and C5-6. Grade 1 anterolisthesis at C4-5.                         Narrative:    EXAM:  CT C-spine without contrast             INDICATION: Fall on Friday. Right-sided pain.             COMPARISON: None.             TECHNIQUE: Thin section axial noncontrast CT of the cervical spine with coronal      and sagittal reformats. CT dose reduction was achieved through use of a      standardized protocol tailored for this examination and automatic exposure      control for dose modulation.             FINDINGS: There is no acute fracture or subluxation. Vertebral body heights and      intervertebral disc spaces are maintained. There are anterior osteophytes at      C5-6. There is 2 mm of anterolisthesis at C4-5. There is left greater than right      facet arthrosis with left foraminal stenosis at C4-5 and C5-6. There is no      spinal canal stenosis. The paraspinal soft tissues are unremarkable. The      visualized lung apices are clear.                          04/26/18 1355    CT ABD PELV W CONT  Final result            Impression:    IMPRESSION:       No acute abnormality in the abdomen or pelvis. No lower rib fracture. Hepatic      steatosis. Small hiatal hernia.                       Narrative:    EXAM:  CT ABD PELV W CONT             INDICATION: Abdominal pain. Fall on Friday. Right-sided pain.             COMPARISON: None.              TECHNIQUE: Helical CT of the abdomen  and pelvis  following the uneventful      intravenous administration of nonionic contrast.  Coronal and sagittal reformats      are performed. CT dose reduction was achieved through use of a standardized      protocol tailored for this examination and automatic exposure control for dose      modulation.             FINDINGS:       The visualized lung bases demonstrate no mass or consolidation. The heart size      is normal. There is no pericardial or pleural effusion. There is a small hiatal      hernia. There is no lower rib fracture.             The liver is diffusely low in attenuation. The spleen, pancreas, and adrenal      glands are normal. The gall bladder is present  without intra- or extra-hepatic      biliary dilatation.               The kidneys are symmetric  without hydronephrosis.              There are no dilated bowel loops.  The appendix is surgically absent. There is      mild distal colonic diverticulosis without focal adjacent inflammation.             There are no enlarged lymph nodes.  There is no free fluid or free air. The      aorta tapers without aneurysm.              The urinary bladder is normal.  There is no pelvic mass. The uterus is      surgically absent.             There is no aggressive bony lesion. There is no lower rib fracture.                                 CXR Results   (Last 48 hours)          None                       Medical Decision Making     I am the first provider for this patient.      I reviewed the vital signs, available nursing notes, past medical history, past surgical history, family history and social history.      Vital Signs-Reviewed the patient's vital signs.   Patient Vitals for the past 12 hrs:            Temp  Pulse  Resp  BP  SpO2            04/26/18 1142  98.6 ??F (37 ??C)  95  16  (!) 148/93  99 %             Records Reviewed: Nursing Notes, Old Medical Records, Previous Radiology Studies and Previous Laboratory  Studies      Provider Notes (Medical Decision Making):     Will get imaging to further evaluate for fracture vs. Dislocation vs. Contusion.  Will treat with analgesics at this time and continue to monitor for changes in mentation.          Worsening si/sxs discussed extensively    Follow up with PCP or RTC if symptoms/signs worsen   Side effects of medication discussed   Education materials provided at discharge    Pt verbalizes agreement with plan         ED Course:    Initial assessment performed. The patients presenting problems have been discussed, and they are in agreement with the care plan formulated and outlined with them.  I have encouraged them to ask questions as they arise throughout their visit.                Disposition:   Discharge       Care plan outlined and precautions discussed.  Patient has no new complaints, changes, or physical findings.  Results of visit were reviewed with the patient. All medications were reviewed with the patient; will d/c home. All of pt's questions and concerns  were addressed. Patient was instructed and agrees to follow up with pcp, as well as to return to the ED upon further deterioration. Patient is ready to go home.        Diagnosis        Clinical Impression:  1.  Fall, initial encounter      2.  Contusion of rib on right side, initial encounter         3.  Acute cystitis without hematuria

## 2018-04-26 NOTE — ED Notes (Signed)
Patient is at x ray.

## 2018-04-26 NOTE — ED Provider Notes (Signed)
EMERGENCY DEPARTMENT HISTORY AND PHYSICAL EXAM      Date: 04/26/2018  Patient Name: Kayla Shaffer    History of Presenting Illness     Please note that this dictation was completed with Viviann Spare, the computer voice recognition software.  Quite often unanticipated grammatical, syntax, homophones, and other interpretive errors are inadvertently transcribed by the computer software.  Please disregard these errors.  Please excuse any errors that have escaped final proofreading.      Chief Complaint   Patient presents with   ??? Rib Injury     pt reports she fell on Friday, has pain to right side of body, ribs and shoulder   ??? Shoulder Injury       History Provided By: Patient    HPI: Kayla Shaffer, 67 y.o. female with PMHx  For morbid obesity, diabetes, celiac disease,, presents to the ED with cc of right rib pain, nausea, after patient had a fall 2 days ago.  Patient states that she was getting something out of her car when she fell face forward onto the pavement.  Patient states that she has history of wrist fracture so she did not try to stop her fall with her hands but she is tucked her hands across her chest.  Patient states she also did not hit her head kept her neck up while falling.    Patient denies any chest pain, dizziness, shortness of breath before during and after the fall.      She denies any abdominal pain but states that she has been able to eat and drink normally but does admit to some mild nausea  That started today but does not know if that is from the pain or not     Patient also admits to urinary frequency.    Patient denies any dysuria, urinary urgency, fevers, chills, , vomiting, chest pain, shortness of breath, headache, rash, diarrhea, sweating or weight loss, hematochezia, saddle anesthesia, loss of bowel/bladder function, hematuria        There are no other complaints, changes, or physical findings at this time.    Social History     Tobacco Use   ??? Smoking status: Former Smoker    ??? Smokeless tobacco: Never Used   Substance Use Topics   ??? Alcohol use: Yes     Comment: socially, mixed drink   ??? Drug use: No       Allergies   Allergen Reactions   ??? Ceftin [Cefuroxime Axetil] Anaphylaxis     Throat almost closed off   ??? Bactrim [Sulfamethoprim] Swelling     Throat swelling   ??? Codeine Swelling     throat   ??? Sulfa (Sulfonamide Antibiotics) Swelling     Swollen throat         PCP: Loraine Maple, DO    No current facility-administered medications on file prior to encounter.      Current Outpatient Medications on File Prior to Encounter   Medication Sig Dispense Refill   ??? pantoprazole (PROTONIX) 40 mg tablet Take 40 mg by mouth daily.     ??? atorvastatin (LIPITOR) 40 mg tablet Take 40 mg by mouth nightly.     ??? buPROPion XL (WELLBUTRIN XL) 300 mg XL tablet Take 300 mg by mouth every morning.     ??? DULoxetine (CYMBALTA) 20 mg capsule Take 20 mg by mouth nightly.     ??? nizatidine (AXID PO) Take 300 mg by mouth nightly.     ???  dexlansoprazole (DEXILANT) 60 mg CpDB capsule (delayed release) Take 1 Each by mouth daily.     ??? estradiol (VAGIFEM) 10 mcg tab vaginal tablet Insert 10 mcg into vagina every seven (7) days.     ??? topiramate (TOPAMAX) 25 mg tablet Take 25 mg by mouth two (2) times a day. One qam two qhs     ??? denosumab (PROLIA) 60 mg/mL injection 60 mg by SubCUTAneous route every 6 months.     ??? ZOLMitriptan (ZOMIG ZMT) 5 mg disintegrating tablet Take 5 mg by mouth as needed for Migraine.     ??? fluticasone (FLOVENT HFA) 110 mcg/actuation inhaler Take 2 Puffs by inhalation every twelve (12) hours.     ??? albuterol (PROVENTIL HFA) 90 mcg/actuation inhaler Take 2 Puffs by inhalation as needed for Wheezing.         Past History     Past Medical History:  Past Medical History:   Diagnosis Date   ??? Asthma     last flare up over a year ago as stated 09/02/2017   ??? Barrett esophagus    ??? Diabetes ([place])     no medications, just watching   ??? Hiatal hernia     sliding   ??? Migraine    ??? Osteoarthritis     ??? Osteoporosis        Past Surgical History:  Past Surgical History:   Procedure Laterality Date   ??? COLONOSCOPY N/A 09/18/2017    COLONOSCOPY/EGD performed by [name], MD at [facility]   ??? HX APPENDECTOMY     ??? HX BREAST BIOPSY Left     Stereotactic biopsy. Benign. Long time ago. Clip.   ??? HX CARPAL TUNNEL RELEASE Bilateral    ??? HX CATARACT REMOVAL Bilateral 2016   ??? HX COLONOSCOPY     ??? HX ENDOSCOPY     ??? HX HYSTERECTOMY      a long time ago   ??? 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 HX OPEN REDUCTION INTERNAL FIXATION Right 1993    no hardware, wrist   ??? HX OPEN REDUCTION INTERNAL FIXATION Left 2014    wrist, no hardware       Family History:  Family History   Problem Relation Age of Onset   ??? Breast Cancer Paternal Grandmother         onset: 16s       Social History:  Social History     Tobacco Use   ??? Smoking status: Former Smoker   ??? Smokeless tobacco: Never Used   Substance Use Topics   ??? Alcohol use: Yes     Comment: socially, mixed drink   ??? Drug use: No       Allergies:  Allergies   Allergen Reactions   ??? Ceftin [Cefuroxime Axetil] Anaphylaxis     Throat almost closed off   ??? Bactrim [Sulfamethoprim] Swelling     Throat swelling   ??? Codeine Swelling     throat   ??? Sulfa (Sulfonamide Antibiotics) Swelling     Swollen throat         Review of Systems   Review of Systems   Constitutional: Negative.  Negative for chills and fever.   HENT: Negative.    Eyes: Negative.    Respiratory: Negative.  Negative for shortness of breath.    Cardiovascular: Negative.  Negative for chest pain.  Gastrointestinal: Negative.  Negative for abdominal pain, diarrhea, nausea and vomiting.   Endocrine: Negative.    Genitourinary: Negative.    Musculoskeletal: Positive for arthralgias.   Skin: Negative.    Allergic/Immunologic: Negative.    Neurological: Negative.  Negative for headaches.   Hematological: Negative.     Psychiatric/Behavioral: Negative.    All other systems reviewed and are negative.      Physical Exam   Physical Exam   Constitutional: She is oriented to person, place, and time. She appears well-developed and well-nourished. No distress.   Pleasant, obese   HENT:   Head: Normocephalic and atraumatic.   Right Ear: External ear normal. No hemotympanum.   Left Ear: External ear normal. No hemotympanum.   Nose: Nose normal. No epistaxis.   Mouth/Throat: Uvula is midline, oropharynx is clear and moist and mucous membranes are normal. No oropharyngeal exudate.   No bilateral hemotympanum.   Eyes: Pupils are equal, round, and reactive to light. Conjunctivae and EOM are normal.   Neck: Trachea normal, normal range of motion and full passive range of motion without pain. Neck supple. No spinous process tenderness and no muscular tenderness present. No neck rigidity. No tracheal deviation present.   Cardiovascular: Normal rate, regular rhythm, normal heart sounds and intact distal pulses.   Pulmonary/Chest: Effort normal and breath sounds normal. No respiratory distress. She has no wheezes. She exhibits tenderness (right lower ribs).   Abdominal: Soft. Bowel sounds are normal. She exhibits no distension. There is tenderness (right upper quadrant/right upper ribs). There is no rebound, no CVA tenderness, no tenderness at McBurney's point and negative Murphy's sign.   Musculoskeletal: Normal range of motion. She exhibits no edema, tenderness or deformity.        Right shoulder: Normal.        Left shoulder: Normal.        Right elbow: Normal.       Left elbow: Normal.        Right wrist: Normal.        Left wrist: Normal.        Right hip: Normal.        Left hip: Normal.        Right knee: Normal.        Left knee: Normal.        Right ankle: Normal.        Left ankle: Normal.        Cervical back: She exhibits normal range of motion, no tenderness, no bony tenderness, no swelling, no edema, no deformity, no laceration, no  pain, no spasm and normal pulse.        Thoracic back: Normal. She exhibits normal range of motion, no tenderness, no bony tenderness, no swelling, no edema, no deformity, no laceration, no pain, no spasm and normal pulse.        Lumbar back: Normal. She exhibits normal range of motion, no tenderness, no bony tenderness, no swelling, no edema, no deformity, no laceration, no pain, no spasm and normal pulse.        Right lower leg: Normal.        Left lower leg: Normal.        Right foot: Normal.        Left foot: Normal.   Lymphadenopathy:     She has no cervical adenopathy.   Neurological: She is alert and oriented to person, place, and time. She has normal strength and normal reflexes. She is not disoriented. She displays  no atrophy and no tremor. No cranial nerve deficit or sensory deficit. She exhibits normal muscle tone. She displays a negative Romberg sign. She displays no seizure activity. Coordination and gait normal.   Intact finger to nose, no pronator drift    Skin: Skin is warm and dry. She is not diaphoretic. No pallor.   Psychiatric: She has a normal mood and affect. Her behavior is normal. Judgment and thought content normal.   Nursing note and vitals reviewed.      Diagnostic Study Results     Labs -     Recent Results (from the past 12 hour(s))   CBC WITH AUTOMATED DIFF    Collection Time: 04/26/18 12:13 PM   Result Value Ref Range    WBC 6.5 3.6 - 11.0 K/uL    RBC 4.93 3.80 - 5.20 M/uL    HGB 15.6 11.5 - 16.0 g/dL    HCT 47.2 (H) 35.0 - 47.0 %    MCV 95.7 80.0 - 99.0 FL    MCH 31.6 26.0 - 34.0 PG    MCHC 33.1 30.0 - 36.5 g/dL    RDW 12.9 11.5 - 14.5 %    PLATELET 287 150 - 400 K/uL    MPV 10.1 8.9 - 12.9 FL    NRBC 0.0 0 PER 100 WBC    ABSOLUTE NRBC 0.00 0.00 - 0.01 K/uL    NEUTROPHILS 62 32 - 75 %    LYMPHOCYTES 29 12 - 49 %    MONOCYTES 7 5 - 13 %    EOSINOPHILS 1 0 - 7 %    BASOPHILS 1 0 - 1 %    IMMATURE GRANULOCYTES 0 0.0 - 0.5 %    ABS. NEUTROPHILS 4.1 1.8 - 8.0 K/UL     ABS. LYMPHOCYTES 1.9 0.8 - 3.5 K/UL    ABS. MONOCYTES 0.5 0.0 - 1.0 K/UL    ABS. EOSINOPHILS 0.1 0.0 - 0.4 K/UL    ABS. BASOPHILS 0.0 0.0 - 0.1 K/UL    ABS. IMM. GRANS. 0.0 0.00 - 0.04 K/UL    DF AUTOMATED     METABOLIC PANEL, COMPREHENSIVE    Collection Time: 04/26/18 12:13 PM   Result Value Ref Range    Sodium 140 136 - 145 mmol/L    Potassium 3.7 3.5 - 5.1 mmol/L    Chloride 108 97 - 108 mmol/L    CO2 25 21 - 32 mmol/L    Anion gap 7 5 - 15 mmol/L    Glucose 190 (H) 65 - 100 mg/dL    BUN 11 6 - 20 MG/DL    Creatinine 0.95 0.55 - 1.02 MG/DL    BUN/Creatinine ratio 12 12 - 20      GFR est AA >60 >60 ml/min/1.27m    GFR est non-AA 59 (L) >60 ml/min/1.734m   Calcium 8.8 8.5 - 10.1 MG/DL    Bilirubin, total 0.7 0.2 - 1.0 MG/DL    ALT (SGPT) 54 12 - 78 U/L    AST (SGOT) 21 15 - 37 U/L    Alk. phosphatase 98 45 - 117 U/L    Protein, total 7.0 6.4 - 8.2 g/dL    Albumin 3.9 3.5 - 5.0 g/dL    Globulin 3.1 2.0 - 4.0 g/dL    A-G Ratio 1.3 1.1 - 2.2     TROPONIN I    Collection Time: 04/26/18 12:13 PM   Result Value Ref Range    Troponin-I, Qt. <0.05 <0.05 ng/mL  EKG, 12 LEAD, INITIAL    Collection Time: 04/26/18 12:13 PM   Result Value Ref Range    Ventricular Rate 82 BPM    Atrial Rate 82 BPM    P-R Interval 154 ms    QRS Duration 68 ms    Q-T Interval 384 ms    QTC Calculation (Bezet) 448 ms    Calculated P Axis 46 degrees    Calculated R Axis -10 degrees    Calculated T Axis 89 degrees    Diagnosis       Normal sinus rhythm  Minimal voltage criteria for LVH, may be normal variant  T wave abnormality, consider lateral ischemia  No previous ECGs available     URINALYSIS W/ REFLEX CULTURE    Collection Time: 04/26/18 12:21 PM   Result Value Ref Range    Color YELLOW/STRAW      Appearance CLOUDY (A) CLEAR      Specific gravity 1.021 1.003 - 1.030      pH (UA) 6.5 5.0 - 8.0      Protein NEGATIVE  NEG mg/dL    Glucose NEGATIVE  NEG mg/dL    Ketone NEGATIVE  NEG mg/dL    Bilirubin NEGATIVE  NEG      Blood NEGATIVE  NEG       Urobilinogen 1.0 0.2 - 1.0 EU/dL    Nitrites NEGATIVE  NEG      Leukocyte Esterase SMALL (A) NEG      WBC 5-10 0 - 4 /hpf    RBC 0-5 0 - 5 /hpf    Epithelial cells MANY (A) FEW /lpf    Bacteria 4+ (A) NEG /hpf    UA:UC IF INDICATED URINE CULTURE ORDERED (A) CNI      Mucus TRACE (A) NEG /lpf       Radiologic Studies -   CT HEAD WO CONT   Final Result   IMPRESSION:    No acute intracranial abnormality. 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         CT SPINE CERV WO CONT   Final Result   IMPRESSION:    No acute abnormality. Left greater than right facet arthrosis with left   foraminal stenosis at C4-5 and C5-6. Grade 1 anterolisthesis at C4-5.         CT ABD PELV W CONT   Final Result   IMPRESSION:    No acute abnormality in the abdomen or pelvis. No lower rib fracture. Hepatic   steatosis. Small hiatal hernia.      XR RIBS RT W PA CXR MIN 3 V   Final Result   IMPRESSION:   1. Osteopenia with chronic appearing right-sided rib fractures. An acute   fracture is not demonstrated.        CT Results  (Last 48 hours)               04/26/18 1355  CT HEAD WO CONT Final result    Impression:  IMPRESSION:    No acute intracranial abnormality. 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           Narrative:  EXAM:  CT head without contrast       INDICATION: Fall on Friday. Right-sided pain.       COMPARISON: MRI brain 10/14/2016.       TECHNIQUE: Axial noncontrast head CT from foramen magnum to vertex. Coronal and   sagittal  reformatted images were obtained. CT dose reduction was achieved   through use of a standardized protocol tailored for this examination and   automatic exposure control for dose modulation.       FINDINGS:  The ventricles and sulci are age-appropriate without hydrocephalus.   There is no mass effect or midline shift. There is no intracranial hemorrhage or   extra-axial fluid collection. Right MCA aneurysm clips are noted with  stable   right frontal encephalomalacia. The basal cisterns are patent.       A right frontal craniotomy is again noted. The visualized paranasal sinuses and   mastoid air cells are clear.           04/26/18 1355  CT SPINE CERV WO CONT Final result    Impression:  IMPRESSION:    No acute abnormality. Left greater than right facet arthrosis with left   foraminal stenosis at C4-5 and C5-6. Grade 1 anterolisthesis at C4-5.           Narrative:  EXAM:  CT C-spine without contrast       INDICATION: Fall on Friday. Right-sided pain.       COMPARISON: None.       TECHNIQUE: Thin section axial noncontrast CT of the cervical spine with coronal   and sagittal reformats. CT dose reduction was achieved through use of a   standardized protocol tailored for this examination and automatic exposure   control for dose modulation.       FINDINGS: There is no acute fracture or subluxation. Vertebral body heights and   intervertebral disc spaces are maintained. There are anterior osteophytes at   C5-6. There is 2 mm of anterolisthesis at C4-5. There is left greater than right   facet arthrosis with left foraminal stenosis at C4-5 and C5-6. There is no   spinal canal stenosis. The paraspinal soft tissues are unremarkable. The   visualized lung apices are clear.           04/26/18 1355  CT ABD PELV W CONT Final result    Impression:  IMPRESSION:    No acute abnormality in the abdomen or pelvis. No lower rib fracture. Hepatic   steatosis. Small hiatal hernia.       Narrative:  EXAM:  CT ABD PELV W CONT       INDICATION: Abdominal pain. Fall on Friday. Right-sided pain.       COMPARISON: None.       TECHNIQUE: Helical CT of the abdomen  and pelvis  following the uneventful   intravenous administration of nonionic contrast.  Coronal and sagittal reformats   are performed. CT dose reduction was achieved through use of a standardized   protocol tailored for this examination and automatic exposure control for dose   modulation.        FINDINGS:    The visualized lung bases demonstrate no mass or consolidation. The heart size   is normal. There is no pericardial or pleural effusion. There is a small hiatal   hernia. There is no lower rib fracture.       The liver is diffusely low in attenuation. The spleen, pancreas, and adrenal   glands are normal. The gall bladder is present  without intra- or extra-hepatic   biliary dilatation.         The kidneys are symmetric without hydronephrosis.        There are no dilated bowel loops.  The appendix is surgically absent. There is  mild distal colonic diverticulosis without focal adjacent inflammation.       There are no enlarged lymph nodes.  There is no free fluid or free air. The   aorta tapers without aneurysm.        The urinary bladder is normal.  There is no pelvic mass. The uterus is   surgically absent.       There is no aggressive bony lesion. There is no lower rib fracture.               CXR Results  (Last 48 hours)    None            Medical Decision Making   I am the first provider for this patient.    I reviewed the vital signs, available nursing notes, past medical history, past surgical history, family history and social history.    Vital Signs-Reviewed the patient's vital signs.  Patient Vitals for the past 12 hrs:   Temp Pulse Resp BP SpO2   04/26/18 1142 98.6 ??F (37 ??C) 95 16 (!) 148/93 99 %         Records Reviewed: Nursing Notes, Old Medical Records, Previous Radiology Studies and Previous Laboratory Studies    Provider Notes (Medical Decision Making):    Will get imaging to further evaluate for fracture vs. Dislocation vs. Contusion.  Will treat with analgesics at this time and continue to monitor for changes in mentation.       Worsening si/sxs discussed extensively   Follow up with PCP or RTC if symptoms/signs worsen  Side effects of medication discussed  Education materials provided at discharge   Pt verbalizes agreement with plan      ED Course:    Initial assessment performed. The patients presenting problems have been discussed, and they are in agreement with the care plan formulated and outlined with them.  I have encouraged them to ask questions as they arise throughout their visit.           Disposition:  Discharge     Care plan outlined and precautions discussed.  Patient has no new complaints, changes, or physical findings.  Results of visit were reviewed with the patient. All medications were reviewed with the patient; will d/c home. All of pt's questions and concerns were addressed. Patient was instructed and agrees to follow up with pcp, as well as to return to the ED upon further deterioration. Patient is ready to go home.    Diagnosis     Clinical Impression:   1. Fall, initial encounter    2. Contusion of rib on right side, initial encounter    3. Acute cystitis without hematuria

## 2018-04-26 NOTE — ED Notes (Signed)
Patient is at x-ray

## 2018-04-27 LAB — EKG 12-LEAD
Atrial Rate: 82 {beats}/min
Diagnosis: NORMAL
P Axis: 46 degrees
P-R Interval: 154 ms
Q-T Interval: 384 ms
QRS Duration: 68 ms
QTc Calculation (Bazett): 448 ms
R Axis: -10 degrees
T Axis: 89 degrees
Ventricular Rate: 82 {beats}/min

## 2018-04-27 LAB — EKG, 12 LEAD, INITIAL
Atrial Rate: 82 {beats}/min
Calculated P Axis: 46 degrees
Calculated R Axis: -10 degrees
Calculated T Axis: 89 degrees
Diagnosis: NORMAL
P-R Interval: 154 ms
Q-T Interval: 384 ms
QRS Duration: 68 ms
QTC Calculation (Bezet): 448 ms
Ventricular Rate: 82 {beats}/min

## 2018-04-28 LAB — CULTURE, URINE
Colonies Counted: >100000
Colony Count: >100000

## 2018-10-12 ENCOUNTER — Ambulatory Visit: Attending: Neurology | Primary: Family Medicine

## 2018-10-12 ENCOUNTER — Ambulatory Visit: Admit: 2018-10-12 | Payer: PRIVATE HEALTH INSURANCE | Attending: Neurology | Primary: Family Medicine

## 2018-10-12 DIAGNOSIS — G609 Hereditary and idiopathic neuropathy, unspecified: Secondary | ICD-10-CM

## 2018-10-12 MED ORDER — ERENUMAB-AOOE 140 MG/ML SUBCUTANEOUS AUTO-INJECTOR
140 mg/mL | INJECTION | SUBCUTANEOUS | 11 refills | Status: DC
Start: 2018-10-12 — End: 2018-10-14

## 2018-10-12 MED ORDER — GALCANEZUMAB-GNLM 120 MG/ML SUBCUTANEOUS PEN INJECTOR
120 mg/mL | SUBCUTANEOUS | 11 refills | Status: DC
Start: 2018-10-12 — End: 2018-10-12

## 2018-10-12 MED ORDER — GALCANEZUMAB-GNLM 120 MG/ML SUBCUTANEOUS PEN INJECTOR
120 mg/mL | Freq: Once | SUBCUTANEOUS | 0 refills | Status: DC
Start: 2018-10-12 — End: 2018-10-12

## 2018-10-12 MED ORDER — ERENUMAB-AOOE 140 MG/ML SUBCUTANEOUS AUTO-INJECTOR
140 mg/mL | INJECTION | SUBCUTANEOUS | 0 refills | Status: DC
Start: 2018-10-12 — End: 2019-09-28

## 2018-10-12 NOTE — Progress Notes (Signed)
Neurology Note    Chief Complaint   Patient presents with   ??? New Patient   ??? Headache       HPI/Subjective  Kayla Shaffer is a 68 y.o. female who presented to the neurology office for management of migraines.     She has had migraines for a long time. It started around the age of 29. She does have headaches on the left side of the head. It is sharp, throbbing and aching in character. It is around 10/10 in severity. There is associated photophobia, phonophobia, nausea.  If she takes zomig, it lasts for around 12 hrs. she does have significant drowsiness on Topamax.  She is presently taking 50 mg in the morning and 100 mg at night.    She moved to [facility] from [place] around 2 yrs ago. 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     The patient is also complaining of poor balance and numbness in the legs.    Baseline headache frequency: 12/month    Risk Factors for headaches  Smoking: denies  Coffee: 1 cups/day  Tea: 3-4 cups/day  Soda: 2/week  Water: 8-10 glasses/day  Sleeps at 9 pm in her couch and wakes up at 11 pm and then she goes to her bed and she does wake up at 9 am. She does go to bed again around 1 pm and sleeps for 2-3 hrs.. She does snore.    Medications tried  Preventative therapy:  Topamax  Cymbalta  Depakote  Botox    Abortive therapy:  Zomig     Current Outpatient Medications   Medication Sig   ??? erenumab-aooe (AIMOVIG AUTOINJECTOR) 140 mg/mL injection 1 mL by SubCUTAneous route every thirty (30) days.   ??? erenumab-aooe (AIMOVIG AUTOINJECTOR) 140 mg/mL injection 1 mL by SubCUTAneous route every thirty (30) days.   ??? pantoprazole (PROTONIX) 40 mg tablet Take 40 mg by mouth daily.   ??? atorvastatin (LIPITOR) 40 mg tablet Take 40 mg by mouth nightly.   ??? buPROPion XL (WELLBUTRIN XL) 300 mg XL tablet Take 300 mg by mouth every morning.   ??? DULoxetine (CYMBALTA) 20 mg capsule Take 20 mg by mouth nightly.   ??? nizatidine (AXID PO) Take 300 mg by mouth nightly.   ??? estradiol  (VAGIFEM) 10 mcg tab vaginal tablet Insert 10 mcg into vagina every seven (7) days.   ??? topiramate (TOPAMAX) 25 mg tablet Take 50 mg by mouth two (2) times a day. Taking 1 tab in morning and 2 tabs at night   ??? denosumab (PROLIA) 60 mg/mL injection 60 mg by SubCUTAneous route every 6 months.   ??? ZOLMitriptan (ZOMIG ZMT) 5 mg disintegrating tablet Take 5 mg by mouth as needed for Migraine.   ??? fluticasone (FLOVENT HFA) 110 mcg/actuation inhaler Take 2 Puffs by inhalation every twelve (12) hours.   ??? albuterol (PROVENTIL HFA) 90 mcg/actuation inhaler Take 2 Puffs by inhalation as needed for Wheezing.   ??? dexlansoprazole (DEXILANT) 60 mg CpDB capsule (delayed release) Take 1 Each by mouth daily.     No current facility-administered medications for this visit.      Allergies   Allergen Reactions   ??? Ceftin [Cefuroxime Axetil] Anaphylaxis     Throat almost closed off   ??? Bactrim [Sulfamethoprim] Swelling     Throat swelling   ??? Codeine Swelling     throat   ??? Sulfa (Sulfonamide Antibiotics) Swelling     Swollen  throat     Past Medical History:   Diagnosis Date   ??? Asthma     last flare up over a year ago as stated 09/02/2017   ??? Barrett esophagus    ??? Diabetes ([place])     no medications, just watching   ??? Hiatal hernia     sliding   ??? Migraine    ??? Osteoarthritis    ??? Osteoporosis      Past Surgical History:   Procedure Laterality Date   ??? COLONOSCOPY N/A 09/18/2017    COLONOSCOPY/EGD performed by [name], [place] at [facility]   ??? HX APPENDECTOMY     ??? HX BREAST BIOPSY Left     Stereotactic biopsy. Benign. Long time ago. Clip.   ??? HX CARPAL TUNNEL RELEASE Bilateral    ??? HX CATARACT REMOVAL Bilateral 2016   ??? HX COLONOSCOPY     ??? HX ENDOSCOPY     ??? HX HYSTERECTOMY      a long time ago   ??? HX INTRACRANIAL ANEURYSM REPAIR Right 10/2015    behind right eye, clip, done at [facility], safe for MRI   ??? HX OPEN REDUCTION INTERNAL FIXATION Right 1993    no hardware, wrist   ??? HX OPEN REDUCTION INTERNAL FIXATION Left  2014    wrist, no hardware     Family History   Problem Relation Age of Onset   ??? Breast Cancer Paternal Grandmother         onset: 74s   ??? Dementia Mother    ??? Headache Mother    ??? Heart Disease Father    ??? Parkinsonism Father    ??? Stroke Father      Social History     Tobacco Use   ??? Smoking status: Former Smoker   ??? Smokeless tobacco: Never Used   Substance Use Topics   ??? Alcohol use: Yes     Comment: socially, mixed drink   ??? Drug use: No       REVIEW OF SYSTEMS:   A ten system review of constitutional, cardiovascular, respiratory, musculoskeletal, endocrine, skin, SHEENT, genitourinary, psychiatric and neurologic systems was obtained and is unremarkable with the exception of anxiety, chest pain, constipation, cough, depression, diarrhea, falls, fatigue, frequent headache, joint pain, memory loss, muscle pain, muscle weakness, nausea, rash, ringing in the ear, snoring, stomach pain, vertigo and visual disturbance    EXAMINATION:   Visit Vitals  BP 158/82   Pulse 90   Ht '5\' 2"'  (1.575 m)   Wt 197 lb (89.4 kg)   BMI 36.03 kg/m??        General:   General appearance: Pt is in no acute distress   Distal pulses are preserved  Fundoscopic Exam: Normal    Neurological Examination:   Mental Status: AAO x3. Speech is fluent. Follows commands, has normal fund of knowledge, attention, short term recall, comprehension and insight.     Cranial Nerves: Visual fields are full. PERRL, Extraocular movements are full. Facial sensation intact. Facial movement intact. Hearing intact to conversation. Palate elevates symmetrically. Shoulder shrug symmetric. Tongue midline.     Motor: Strength is 5/5 in all 4 ext. No atrophy.     Tone: Normal    Sensation: Light touch - Normal    Reflexes: DTRs 2+ throughout.      Coordination/Cerebellar: Intact to finger-nose-finger     Gait: Casual gait is normal.     Skin: No significant bruising or lacerations.  Laboratory review:   Results for orders placed or performed during the hospital  encounter of 04/26/18   CULTURE, URINE   Result Value Ref Range    Special Requests: NO SPECIAL REQUESTS  Reflexed from [id number]        Colony Count >100,000  COLONIES/mL        Culture result: MIXED UROGENITAL FLORA ISOLATED     CBC WITH AUTOMATED DIFF   Result Value Ref Range    WBC 6.5 3.6 - 11.0 K/uL    RBC 4.93 3.80 - 5.20 M/uL    HGB 15.6 11.5 - 16.0 g/dL    HCT 47.2 (H) 35.0 - 47.0 %    MCV 95.7 80.0 - 99.0 FL    MCH 31.6 26.0 - 34.0 PG    MCHC 33.1 30.0 - 36.5 g/dL    RDW 12.9 11.5 - 14.5 %    PLATELET 287 150 - 400 K/uL    MPV 10.1 8.9 - 12.9 FL    NRBC 0.0 0 PER 100 WBC    ABSOLUTE NRBC 0.00 0.00 - 0.01 K/uL    NEUTROPHILS 62 32 - 75 %    LYMPHOCYTES 29 12 - 49 %    MONOCYTES 7 5 - 13 %    EOSINOPHILS 1 0 - 7 %    BASOPHILS 1 0 - 1 %    IMMATURE GRANULOCYTES 0 0.0 - 0.5 %    ABS. NEUTROPHILS 4.1 1.8 - 8.0 K/UL    ABS. LYMPHOCYTES 1.9 0.8 - 3.5 K/UL    ABS. MONOCYTES 0.5 0.0 - 1.0 K/UL    ABS. EOSINOPHILS 0.1 0.0 - 0.4 K/UL    ABS. BASOPHILS 0.0 0.0 - 0.1 K/UL    ABS. IMM. GRANS. 0.0 0.00 - 0.04 K/UL    DF AUTOMATED     METABOLIC PANEL, COMPREHENSIVE   Result Value Ref Range    Sodium 140 136 - 145 mmol/L    Potassium 3.7 3.5 - 5.1 mmol/L    Chloride 108 97 - 108 mmol/L    CO2 25 21 - 32 mmol/L    Anion gap 7 5 - 15 mmol/L    Glucose 190 (H) 65 - 100 mg/dL    BUN 11 6 - 20 MG/DL    Creatinine 0.95 0.55 - 1.02 MG/DL    BUN/Creatinine ratio 12 12 - 20      GFR est AA >60 >60 ml/min/1.4m    GFR est non-AA 59 (L) >60 ml/min/1.746m   Calcium 8.8 8.5 - 10.1 MG/DL    Bilirubin, total 0.7 0.2 - 1.0 MG/DL    ALT (SGPT) 54 12 - 78 U/L    AST (SGOT) 21 15 - 37 U/L    Alk. phosphatase 98 45 - 117 U/L    Protein, total 7.0 6.4 - 8.2 g/dL    Albumin 3.9 3.5 - 5.0 g/dL    Globulin 3.1 2.0 - 4.0 g/dL    A-G Ratio 1.3 1.1 - 2.2     TROPONIN I   Result Value Ref Range    Troponin-I, Qt. <0.05 <0.05 ng/mL   URINALYSIS W/ REFLEX CULTURE   Result Value Ref Range    Color YELLOW/STRAW      Appearance CLOUDY (A) CLEAR       Specific gravity 1.021 1.003 - 1.030      pH (UA) 6.5 5.0 - 8.0      Protein NEGATIVE  NEG mg/dL    Glucose NEGATIVE  NEG mg/dL  Ketone NEGATIVE  NEG mg/dL    Bilirubin NEGATIVE  NEG      Blood NEGATIVE  NEG      Urobilinogen 1.0 0.2 - 1.0 EU/dL    Nitrites NEGATIVE  NEG      Leukocyte Esterase SMALL (A) NEG      WBC 5-10 0 - 4 /hpf    RBC 0-5 0 - 5 /hpf    Epithelial cells MANY (A) FEW /lpf    Bacteria 4+ (A) NEG /hpf    UA:UC IF INDICATED URINE CULTURE ORDERED (A) CNI      Mucus TRACE (A) NEG /lpf   EKG, 12 LEAD, INITIAL   Result Value Ref Range    Ventricular Rate 82 BPM    Atrial Rate 82 BPM    P-R Interval 154 ms    QRS Duration 68 ms    Q-T Interval 384 ms    QTC Calculation (Bezet) 448 ms    Calculated P Axis 46 degrees    Calculated R Axis -10 degrees    Calculated T Axis 89 degrees    Diagnosis       Normal sinus rhythm  Minimal voltage criteria for LVH, may be normal variant  T wave abnormality, consider lateral ischemia  No previous ECGs available  Confirmed by Valeta Harms [phone]) on 04/27/2018 9:33:59 AM         Imaging review:  None    Documentation review:  None    Assessment/Plan:   1. Idiopathic peripheral neuropathy  The patient does have poor balance and on examination has decreased pinprick sensation distally in the lower extremities.  To suspect peripheral neuropathy.  She states that her blood sugars stay around 200.  We will proceed with EMG/nerve conduction study and blood work up    - EMG LIMITED; Future  - VITAMIN B12  - TSH AND FREE T4  - SPEP AND IFE, SERUM  - METHYLMALONIC ACID  - HEMOGLOBIN A1C W/O EAG  - CRP, HIGH SENSITIVITY  - METABOLIC PANEL, COMPREHENSIVE  - CBC WITH AUTOMATED DIFF  - ANA COMPREHENSIVE PLUS PANEL  - ANGIOTENSIN CONVERTING ENZYME    2. Intractable chronic migraine without aura and without status migrainosus  The patient does have chronic intractable migraines and has tried Botox, Depakote, Cymbalta and Topamax.  She does have drowsiness on Topamax since the  dosage has been increased.  I do plan to start the patient on Aimovig and have asked her to decrease the dosage of Topamax to 25 mg in the morning and 50 mg at night because of side effects.    - erenumab-aooe (AIMOVIG AUTOINJECTOR) 140 mg/mL injection; 1 mL by SubCUTAneous route every thirty (30) days.  Dispense: 1 Syringe; Refill: 11  - erenumab-aooe (AIMOVIG AUTOINJECTOR) 140 mg/mL injection; 1 mL by SubCUTAneous route every thirty (30) days.  Dispense: 1 Syringe; Refill: 0    3. Hx of cerebral aneurysm repair  She did have aneurysm clipping in February 2017.  Doing well.    4. Snoring  The patient does snore at night and is excessively drowsy during the day.  Other people have told her that she snores loudly.  We will send her for a sleep study.    - REFERRAL TO SLEEP STUDIES    Follow-up in 3 to 4 months    No flowsheet data found.  Primary care to address possible depression if PHQ-9 score is more than 9.      ICD-10-CM ICD-9-CM    1.  Idiopathic peripheral neuropathy G60.9 356.9 EMG LIMITED      VITAMIN B12      TSH AND FREE T4      SPEP AND IFE, SERUM      METHYLMALONIC ACID      HEMOGLOBIN A1C W/O EAG      CRP, HIGH SENSITIVITY      METABOLIC PANEL, COMPREHENSIVE      CBC WITH AUTOMATED DIFF      ANA COMPREHENSIVE PLUS PANEL      ANGIOTENSIN CONVERTING ENZYME   2. Intractable chronic migraine without aura and without status migrainosus G43.719 346.71 erenumab-aooe (AIMOVIG AUTOINJECTOR) 140 mg/mL injection      erenumab-aooe (AIMOVIG AUTOINJECTOR) 140 mg/mL injection      DISCONTINUED: galcanezumab-gnlm (EMGALITY PEN) 120 mg/mL injection      DISCONTINUED: galcanezumab-gnlm (EMGALITY PEN) 120 mg/mL injection   3. Hx of cerebral aneurysm repair Z98.890 V45.89     Z86.79     4. Snoring R06.83 786.09 REFERRAL TO SLEEP STUDIES      Thank you for allowing me to participate in the care of Ms. Swinger. Please feel free to contact me if you have any questions.    Electronically signed.   Baldemar Friday,  MD  Neurologist    CC: Loraine Maple, DO  Fax: [phone]    This note was created using voice recognition software. Despite editing, there may be syntax errors.

## 2018-10-12 NOTE — Procedures (Signed)
Aimovig 140 mg injected with no side effects

## 2018-10-12 NOTE — Addendum Note (Signed)
Addendum Note by Doroteo Bradford, MD at 10/12/18 0800                Author: Doroteo Bradford, MD  Service: --  Author Type: Physician       Filed: 10/12/18 1046  Encounter Date: 10/12/2018  Status: Signed          Editor: Doroteo Bradford, MD (Physician)          Addended by: Doroteo Bradford on: 10/12/2018 10:46 AM    Modules accepted: Orders

## 2018-10-12 NOTE — Addendum Note (Signed)
Addended byDoroteo Bradford on: 10/12/2018 10:46 AM     Modules accepted: Orders

## 2018-10-12 NOTE — Patient Instructions (Signed)
Office Policies  ?? Phone calls/patient messages:  Please allow up to 24 hours for someone in the office to contact you about your call or message. Be mindful your provider may be out of the office or your message may require further review. We encourage you to use MyChart for your messages as this is a faster, more efficient way to communicate with our office  ?? Medication Refills:  Prescription medications require up to 48 business hours to process. We encourage you to use MyChart for your refills.   For controlled medications: Please allow up to 72 business hours to process. Certain medications may require you to pick up a written prescription at our office.  NO narcotic/controlled medications will be prescribed after 4pm Monday through Friday or on weekends  ?? Form/Paperwork Completion:  Please note there is a $25 fee for all paperwork completed by our providers. We ask that you allow 7-14 business days. Pre-payment is due prior to picking up/faxing the completed form. You may also download your forms to MyChart to have your doctor print off.

## 2018-10-12 NOTE — Progress Notes (Signed)
Neurology Note    Chief Complaint   Patient presents with   ??? New Patient   ??? Headache       HPI/Subjective  Kayla Shaffer is a 68 y.o. female who presented to the neurology office for management of migraines.     She has had migraines for a long time. It started around the age of 13. She does have headaches on the left side of the head. It is sharp, throbbing and aching in character. It is around 10/10 in severity. There is associated photophobia, phonophobia, nausea.  If she takes zomig, it lasts for around 12 hrs. she does have significant drowsiness on Topamax.  She is presently taking 50 mg in the morning and 100 mg at night.    She moved to [facility] from [place] around 2 yrs ago. 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     The patient is also complaining of poor balance and numbness in the legs.    Baseline headache frequency: 12/month    Risk Factors for headaches  Smoking: denies  Coffee: 1 cups/day  Tea: 3-4 cups/day  Soda: 2/week  Water: 8-10 glasses/day  Sleeps at 9 pm in her couch and wakes up at 11 pm and then she goes to her bed and she does wake up at 9 am. She does go to bed again around 1 pm and sleeps for 2-3 hrs.. She does snore.    Medications tried  Preventative therapy:  Topamax  Cymbalta  Depakote  Botox    Abortive therapy:  Zomig     Current Outpatient Medications   Medication Sig   ??? erenumab-aooe (AIMOVIG AUTOINJECTOR) 140 mg/mL injection 1 mL by SubCUTAneous route every thirty (30) days.   ??? erenumab-aooe (AIMOVIG AUTOINJECTOR) 140 mg/mL injection 1 mL by SubCUTAneous route every thirty (30) days.   ??? pantoprazole (PROTONIX) 40 mg tablet Take 40 mg by mouth daily.   ??? atorvastatin (LIPITOR) 40 mg tablet Take 40 mg by mouth nightly.   ??? buPROPion XL (WELLBUTRIN XL) 300 mg XL tablet Take 300 mg by mouth every morning.   ??? DULoxetine (CYMBALTA) 20 mg capsule Take 20 mg by mouth nightly.   ??? nizatidine (AXID PO) Take 300 mg by mouth nightly.    ??? estradiol (VAGIFEM) 10 mcg tab vaginal tablet Insert 10 mcg into vagina every seven (7) days.   ??? topiramate (TOPAMAX) 25 mg tablet Take 50 mg by mouth two (2) times a day. Taking 1 tab in morning and 2 tabs at night   ??? denosumab (PROLIA) 60 mg/mL injection 60 mg by SubCUTAneous route every 6 months.   ??? ZOLMitriptan (ZOMIG ZMT) 5 mg disintegrating tablet Take 5 mg by mouth as needed for Migraine.   ??? fluticasone (FLOVENT HFA) 110 mcg/actuation inhaler Take 2 Puffs by inhalation every twelve (12) hours.   ??? albuterol (PROVENTIL HFA) 90 mcg/actuation inhaler Take 2 Puffs by inhalation as needed for Wheezing.   ??? dexlansoprazole (DEXILANT) 60 mg CpDB capsule (delayed release) Take 1 Each by mouth daily.     No current facility-administered medications for this visit.      Allergies   Allergen Reactions   ??? Ceftin [Cefuroxime Axetil] Anaphylaxis     Throat almost closed off   ??? Bactrim [Sulfamethoprim] Swelling     Throat swelling   ??? Codeine Swelling     throat   ??? Sulfa (Sulfonamide Antibiotics) Swelling     Swollen  throat     Past Medical History:   Diagnosis Date   ??? Asthma     last flare up over a year ago as stated 09/02/2017   ??? Barrett esophagus    ??? Diabetes ([place])     no medications, just watching   ??? Hiatal hernia     sliding   ??? Migraine    ??? Osteoarthritis    ??? Osteoporosis      Past Surgical History:   Procedure Laterality Date   ??? COLONOSCOPY N/A 09/18/2017    COLONOSCOPY/EGD performed by [name], [place] at [facility]   ??? HX APPENDECTOMY     ??? HX BREAST BIOPSY Left     Stereotactic biopsy. Benign. Long time ago. Clip.   ??? HX CARPAL TUNNEL RELEASE Bilateral    ??? HX CATARACT REMOVAL Bilateral 2016   ??? HX COLONOSCOPY     ??? HX ENDOSCOPY     ??? HX HYSTERECTOMY      a long time ago   ??? HX INTRACRANIAL ANEURYSM REPAIR Right 10/2015    behind right eye, clip, done at [facility], safe for MRI   ??? HX OPEN REDUCTION INTERNAL FIXATION Right 1993    no hardware, wrist    ??? HX OPEN REDUCTION INTERNAL FIXATION Left 2014    wrist, no hardware     Family History   Problem Relation Age of Onset   ??? Breast Cancer Paternal Grandmother         onset: 43s   ??? Dementia Mother    ??? Headache Mother    ??? Heart Disease Father    ??? Parkinsonism Father    ??? Stroke Father      Social History     Tobacco Use   ??? Smoking status: Former Smoker   ??? Smokeless tobacco: Never Used   Substance Use Topics   ??? Alcohol use: Yes     Comment: socially, mixed drink   ??? Drug use: No       REVIEW OF SYSTEMS:   A ten system review of constitutional, cardiovascular, respiratory, musculoskeletal, endocrine, skin, SHEENT, genitourinary, psychiatric and neurologic systems was obtained and is unremarkable with the exception of anxiety, chest pain, constipation, cough, depression, diarrhea, falls, fatigue, frequent headache, joint pain, memory loss, muscle pain, muscle weakness, nausea, rash, ringing in the ear, snoring, stomach pain, vertigo and visual disturbance    EXAMINATION:   Visit Vitals  BP 158/82   Pulse 90   Ht 5' 2"  (1.575 m)   Wt 197 lb (89.4 kg)   BMI 36.03 kg/m??        General:   General appearance: Pt is in no acute distress   Distal pulses are preserved  Fundoscopic Exam: Normal    Neurological Examination:   Mental Status: AAO x3. Speech is fluent. Follows commands, has normal fund of knowledge, attention, short term recall, comprehension and insight.     Cranial Nerves: Visual fields are full. PERRL, Extraocular movements are full. Facial sensation intact. Facial movement intact. Hearing intact to conversation. Palate elevates symmetrically. Shoulder shrug symmetric. Tongue midline.     Motor: Strength is 5/5 in all 4 ext. No atrophy.     Tone: Normal    Sensation: Light touch - Normal    Reflexes: DTRs 2+ throughout.      Coordination/Cerebellar: Intact to finger-nose-finger     Gait: Casual gait is normal.     Skin: No significant bruising or lacerations.  Laboratory review:    Results for orders placed or performed during the hospital encounter of 04/26/18   CULTURE, URINE   Result Value Ref Range    Special Requests: NO SPECIAL REQUESTS  Reflexed from [id number]        Colony Count >100,000  COLONIES/mL        Culture result: MIXED UROGENITAL FLORA ISOLATED     CBC WITH AUTOMATED DIFF   Result Value Ref Range    WBC 6.5 3.6 - 11.0 K/uL    RBC 4.93 3.80 - 5.20 M/uL    HGB 15.6 11.5 - 16.0 g/dL    HCT 47.2 (H) 35.0 - 47.0 %    MCV 95.7 80.0 - 99.0 FL    MCH 31.6 26.0 - 34.0 PG    MCHC 33.1 30.0 - 36.5 g/dL    RDW 12.9 11.5 - 14.5 %    PLATELET 287 150 - 400 K/uL    MPV 10.1 8.9 - 12.9 FL    NRBC 0.0 0 PER 100 WBC    ABSOLUTE NRBC 0.00 0.00 - 0.01 K/uL    NEUTROPHILS 62 32 - 75 %    LYMPHOCYTES 29 12 - 49 %    MONOCYTES 7 5 - 13 %    EOSINOPHILS 1 0 - 7 %    BASOPHILS 1 0 - 1 %    IMMATURE GRANULOCYTES 0 0.0 - 0.5 %    ABS. NEUTROPHILS 4.1 1.8 - 8.0 K/UL    ABS. LYMPHOCYTES 1.9 0.8 - 3.5 K/UL    ABS. MONOCYTES 0.5 0.0 - 1.0 K/UL    ABS. EOSINOPHILS 0.1 0.0 - 0.4 K/UL    ABS. BASOPHILS 0.0 0.0 - 0.1 K/UL    ABS. IMM. GRANS. 0.0 0.00 - 0.04 K/UL    DF AUTOMATED     METABOLIC PANEL, COMPREHENSIVE   Result Value Ref Range    Sodium 140 136 - 145 mmol/L    Potassium 3.7 3.5 - 5.1 mmol/L    Chloride 108 97 - 108 mmol/L    CO2 25 21 - 32 mmol/L    Anion gap 7 5 - 15 mmol/L    Glucose 190 (H) 65 - 100 mg/dL    BUN 11 6 - 20 MG/DL    Creatinine 0.95 0.55 - 1.02 MG/DL    BUN/Creatinine ratio 12 12 - 20      GFR est AA >60 >60 ml/min/1.79m    GFR est non-AA 59 (L) >60 ml/min/1.782m   Calcium 8.8 8.5 - 10.1 MG/DL    Bilirubin, total 0.7 0.2 - 1.0 MG/DL    ALT (SGPT) 54 12 - 78 U/L    AST (SGOT) 21 15 - 37 U/L    Alk. phosphatase 98 45 - 117 U/L    Protein, total 7.0 6.4 - 8.2 g/dL    Albumin 3.9 3.5 - 5.0 g/dL    Globulin 3.1 2.0 - 4.0 g/dL    A-G Ratio 1.3 1.1 - 2.2     TROPONIN I   Result Value Ref Range    Troponin-I, Qt. <0.05 <0.05 ng/mL   URINALYSIS W/ REFLEX CULTURE   Result Value Ref Range     Color YELLOW/STRAW      Appearance CLOUDY (A) CLEAR      Specific gravity 1.021 1.003 - 1.030      pH (UA) 6.5 5.0 - 8.0      Protein NEGATIVE  NEG mg/dL    Glucose NEGATIVE  NEG mg/dL  Ketone NEGATIVE  NEG mg/dL    Bilirubin NEGATIVE  NEG      Blood NEGATIVE  NEG      Urobilinogen 1.0 0.2 - 1.0 EU/dL    Nitrites NEGATIVE  NEG      Leukocyte Esterase SMALL (A) NEG      WBC 5-10 0 - 4 /hpf    RBC 0-5 0 - 5 /hpf    Epithelial cells MANY (A) FEW /lpf    Bacteria 4+ (A) NEG /hpf    UA:UC IF INDICATED URINE CULTURE ORDERED (A) CNI      Mucus TRACE (A) NEG /lpf   EKG, 12 LEAD, INITIAL   Result Value Ref Range    Ventricular Rate 82 BPM    Atrial Rate 82 BPM    P-R Interval 154 ms    QRS Duration 68 ms    Q-T Interval 384 ms    QTC Calculation (Bezet) 448 ms    Calculated P Axis 46 degrees    Calculated R Axis -10 degrees    Calculated T Axis 89 degrees    Diagnosis       Normal sinus rhythm  Minimal voltage criteria for LVH, may be normal variant  T wave abnormality, consider lateral ischemia  No previous ECGs available  Confirmed by Valeta Harms [phone]) on 04/27/2018 9:33:59 AM         Imaging review:  None    Documentation review:  None    Assessment/Plan:   1. Idiopathic peripheral neuropathy  The patient does have poor balance and on examination has decreased pinprick sensation distally in the lower extremities.  To suspect peripheral neuropathy.  She states that her blood sugars stay around 200.  We will proceed with EMG/nerve conduction study and blood work up    - EMG LIMITED; Future  - VITAMIN B12  - TSH AND FREE T4  - SPEP AND IFE, SERUM  - METHYLMALONIC ACID  - HEMOGLOBIN A1C W/O EAG  - CRP, HIGH SENSITIVITY  - METABOLIC PANEL, COMPREHENSIVE  - CBC WITH AUTOMATED DIFF  - ANA COMPREHENSIVE PLUS PANEL  - ANGIOTENSIN CONVERTING ENZYME    2. Intractable chronic migraine without aura and without status migrainosus  The patient does have chronic intractable migraines and has tried Botox,  Depakote, Cymbalta and Topamax.  She does have drowsiness on Topamax since the dosage has been increased.  I do plan to start the patient on Aimovig and have asked her to decrease the dosage of Topamax to 25 mg in the morning and 50 mg at night because of side effects.    - erenumab-aooe (AIMOVIG AUTOINJECTOR) 140 mg/mL injection; 1 mL by SubCUTAneous route every thirty (30) days.  Dispense: 1 Syringe; Refill: 11  - erenumab-aooe (AIMOVIG AUTOINJECTOR) 140 mg/mL injection; 1 mL by SubCUTAneous route every thirty (30) days.  Dispense: 1 Syringe; Refill: 0    3. Hx of cerebral aneurysm repair  She did have aneurysm clipping in February 2017.  Doing well.    4. Snoring  The patient does snore at night and is excessively drowsy during the day.  Other people have told her that she snores loudly.  We will send her for a sleep study.    - REFERRAL TO SLEEP STUDIES    Follow-up in 3 to 4 months    No flowsheet data found.  Primary care to address possible depression if PHQ-9 score is more than 9.      ICD-10-CM ICD-9-CM    1.  Idiopathic peripheral neuropathy G60.9 356.9 EMG LIMITED      VITAMIN B12      TSH AND FREE T4      SPEP AND IFE, SERUM      METHYLMALONIC ACID      HEMOGLOBIN A1C W/O EAG      CRP, HIGH SENSITIVITY      METABOLIC PANEL, COMPREHENSIVE      CBC WITH AUTOMATED DIFF      ANA COMPREHENSIVE PLUS PANEL      ANGIOTENSIN CONVERTING ENZYME   2. Intractable chronic migraine without aura and without status migrainosus G43.719 346.71 erenumab-aooe (AIMOVIG AUTOINJECTOR) 140 mg/mL injection      erenumab-aooe (AIMOVIG AUTOINJECTOR) 140 mg/mL injection      DISCONTINUED: galcanezumab-gnlm (EMGALITY PEN) 120 mg/mL injection      DISCONTINUED: galcanezumab-gnlm (EMGALITY PEN) 120 mg/mL injection   3. Hx of cerebral aneurysm repair Z98.890 V45.89     Z86.79     4. Snoring R06.83 786.09 REFERRAL TO SLEEP STUDIES      Thank you for allowing me to participate in the care of Kayla Shaffer.  Please feel free to contact me if you have any questions.    Electronically signed.   Baldemar Friday, MD  Neurologist    CC: Loraine Maple, DO  Fax: [phone]    This note was created using voice recognition software. Despite editing, there may be syntax errors.

## 2018-10-12 NOTE — Procedures (Signed)
Aimovig 140 mg injected with no side effects

## 2018-10-14 ENCOUNTER — Encounter

## 2018-10-14 MED ORDER — ERENUMAB-AOOE 140 MG/ML SUBCUTANEOUS AUTO-INJECTOR
140 mg/mL | INJECTION | SUBCUTANEOUS | 3 refills | Status: DC
Start: 2018-10-14 — End: 2018-10-26

## 2018-10-14 NOTE — Telephone Encounter (Signed)
Patient called - needs PA on Aimovig.      Will submit this week.

## 2018-10-14 NOTE — Telephone Encounter (Signed)
Dr. Sallee Lange,     Please revise for a 3 month supply of Aimovig.     Patient called back and stated [facility] requires 3 mo supply ( and 4 refills) for mail order.     Please advise once completed so I can obtain the P.A.     Thank you!

## 2018-10-19 LAB — ANA COMPREHENSIVE PLUS PANEL
Anti Ribosomal P Ab: <0.2 AI (ref 0.0–0.9)
Anti-DNA (DS) Ab, QT: <1 IU/mL (ref 0–9)
Anti-DNA (DS) Ab, QT: <1 IU/mL (ref 0–9)
Anti-Jo-1: <0.2 AI (ref 0.0–0.9)
Anti-Jo-1: <0.2 AI (ref 0.0–0.9)
Antichromatin Ab: <0.2 AI (ref 0.0–0.9)
Antichromatin Antibodies: <0.2 AI (ref 0.0–0.9)
Antiribosomal P Antibodies: <0.2 AI (ref 0.0–0.9)
Antiscleroderma-70 Antibodies: <0.2 AI (ref 0.0–0.9)
Centromere B Ab: <0.2 AI (ref 0.0–0.9)
Centromere B Antibody: <0.2 AI (ref 0.0–0.9)
RNP ABS: 0.5 AI (ref 0.0–0.9)
RNP Abs: 0.5 AI (ref 0.0–0.9)
SMITH/RNP ANTIBODIES, 016376: <0.2 AI (ref 0.0–0.9)
Scleroderma-70 Ab: <0.2 AI (ref 0.0–0.9)
Sjogren's Anti-SS-A: <0.2 AI (ref 0.0–0.9)
Sjogren's Anti-SS-A: <0.2 AI (ref 0.0–0.9)
Sjogren's Anti-SS-B: <0.2 AI (ref 0.0–0.9)
Sjogren's Anti-SS-B: <0.2 AI (ref 0.0–0.9)
Smith Abs: <0.2 AI (ref 0.0–0.9)
Smith Abs: <0.2 AI (ref 0.0–0.9)
Smith/RNP Abs: <0.2 AI (ref 0.0–0.9)

## 2018-10-19 LAB — SPEP AND IFE, SERUM
A/G RATIO, 149531: 1.7 NA (ref 0.7–1.7)
A/G ratio: 1.7 (ref 0.7–1.7)
ALBUMIN, 149520: 4 g/dL (ref 2.9–4.4)
ALPHA-2-GLOBULIN: 0.7 g/dL (ref 0.4–1.0)
Albumin: 4 g/dL (ref 2.9–4.4)
Alpha-1-Globulin: 0.2 g/dL (ref 0.0–0.4)
Alpha-1-globulin: 0.2 g/dL (ref 0.0–0.4)
Alpha-2-Globulin: 0.7 g/dL (ref 0.4–1.0)
BETA GLOBULIN, 149523: 1 g/dL (ref 0.7–1.3)
Beta Globulin: 1 g/dL (ref 0.7–1.3)
GAMMA GLOBULIN, 149524: 0.5 g/dL (ref 0.4–1.8)
GLOBULIN, TOTAL: 2.4 g/dL (ref 2.2–3.9)
Gamma Globulin: 0.5 g/dL (ref 0.4–1.8)
Globulin, total: 2.4 g/dL (ref 2.2–3.9)
Immunoglobulin A, Qt.: 142 mg/dL (ref 87–352)
Immunoglobulin A, Qt.: 142 mg/dL (ref 87–352)
Immunoglobulin G, Qt.: 616 mg/dL — ABNORMAL LOW (ref 700–1600)
Immunoglobulin G, Quantitative: 616 mg/dL — ABNORMAL LOW (ref 700–1600)
Immunoglobulin M, Qt.: 29 mg/dL (ref 26–217)
Immunoglobulin M, Quantitative: 29 mg/dL (ref 26–217)

## 2018-10-19 LAB — CBC WITH AUTO DIFFERENTIAL
Basophils %: 1 %
Basophils Absolute: 0 10*3/uL (ref 0.0–0.2)
Eosinophils %: 3 %
Eosinophils Absolute: 0.2 10*3/uL (ref 0.0–0.4)
Granulocyte Absolute Count: 0 10*3/uL (ref 0.0–0.1)
Hematocrit: 44.1 % (ref 34.0–46.6)
Hemoglobin: 15.2 g/dL (ref 11.1–15.9)
Immature Granulocytes: 0 %
Lymphocytes %: 31 %
Lymphocytes Absolute: 1.9 10*3/uL (ref 0.7–3.1)
MCH: 32.6 pg (ref 26.6–33.0)
MCHC: 34.5 g/dL (ref 31.5–35.7)
MCV: 95 fL (ref 79–97)
Monocytes %: 7 %
Monocytes Absolute: 0.4 10*3/uL (ref 0.1–0.9)
Neutrophils %: 58 %
Neutrophils Absolute: 3.6 10*3/uL (ref 1.4–7.0)
Platelets: 300 10*3/uL (ref 150–450)
RBC: 4.66 x10E6/uL (ref 3.77–5.28)
RDW: 12.1 % (ref 11.7–15.4)
WBC: 6.1 10*3/uL (ref 3.4–10.8)

## 2018-10-19 LAB — COMPREHENSIVE METABOLIC PANEL
ALT: 38 IU/L — ABNORMAL HIGH (ref 0–32)
AST: 25 IU/L (ref 0–40)
Albumin/Globulin Ratio: 2.6 NA — ABNORMAL HIGH (ref 1.2–2.2)
Albumin: 4.6 g/dL (ref 3.8–4.8)
Alkaline Phosphatase: 98 IU/L (ref 39–117)
BUN: 15 mg/dL (ref 8–27)
Bun/Cre Ratio: 16 NA (ref 12–28)
CO2: 21 mmol/L (ref 20–29)
Calcium: 9.3 mg/dL (ref 8.7–10.3)
Chloride: 109 mmol/L — ABNORMAL HIGH (ref 96–106)
Creatinine: 0.93 mg/dL (ref 0.57–1.00)
EGFR IF NonAfrican American: 64 mL/min/{1.73_m2} (ref >59)
GFR African American: 74 mL/min/{1.73_m2} (ref >59)
Globulin, Total: 1.8 g/dL (ref 1.5–4.5)
Glucose: 175 mg/dL — ABNORMAL HIGH (ref 65–99)
Potassium: 5 mmol/L (ref 3.5–5.2)
Sodium: 144 mmol/L (ref 134–144)
Total Bilirubin: 0.4 mg/dL (ref 0.0–1.2)
Total Protein: 6.4 g/dL (ref 6.0–8.5)

## 2018-10-19 LAB — TSH + FREE T4 PANEL
T4 Free: 1.05 ng/dL (ref 0.82–1.77)
TSH: 4.16 u[IU]/mL (ref 0.450–4.500)

## 2018-10-19 LAB — ANGIOTENSIN CONVERTING ENZYME
ACE,ACE: 36 U/L (ref 14–82)
Angiotensin Converting Enzyme (ACE): 36 U/L (ref 14–82)

## 2018-10-19 LAB — ONCOTYPE DX DCIS: METHYLMALONIC ACID, SERUM: 152 nmol/L (ref 0–378)

## 2018-10-19 LAB — HEMOGLOBIN A1C W/O EAG
Hemoglobin A1C: 7 % — ABNORMAL HIGH (ref 4.8–5.6)
Hemoglobin A1c: 7 % — ABNORMAL HIGH (ref 4.8–5.6)

## 2018-10-19 LAB — VITAMIN B12
Vitamin B-12: 714 pg/mL (ref 232–1245)
Vitamin B12: 714 pg/mL (ref 232–1245)

## 2018-10-19 LAB — CRP, HIGH SENSITIVITY
C-Reactive Protein, Cardiac: 2.12 mg/L (ref 0.00–3.00)
C-Reactive Protein, Cardiac: 2.12 mg/L (ref 0.00–3.00)

## 2018-10-19 LAB — CBC WITH AUTOMATED DIFF
ABS. BASOPHILS: 0 10*3/uL (ref 0.0–0.2)
ABS. EOSINOPHILS: 0.2 10*3/uL (ref 0.0–0.4)
ABS. IMM. GRANS.: 0 10*3/uL (ref 0.0–0.1)
ABS. MONOCYTES: 0.4 10*3/uL (ref 0.1–0.9)
ABS. NEUTROPHILS: 3.6 10*3/uL (ref 1.4–7.0)
Abs Lymphocytes: 1.9 10*3/uL (ref 0.7–3.1)
BASOPHILS: 1 %
EOSINOPHILS: 3 %
HCT: 44.1 % (ref 34.0–46.6)
HGB: 15.2 g/dL (ref 11.1–15.9)
IMMATURE GRANULOCYTES: 0 %
Lymphocytes: 31 %
MCH: 32.6 pg (ref 26.6–33.0)
MCHC: 34.5 g/dL (ref 31.5–35.7)
MCV: 95 fL (ref 79–97)
MONOCYTES: 7 %
NEUTROPHILS: 58 %
PLATELET: 300 10*3/uL (ref 150–450)
RBC: 4.66 x10E6/uL (ref 3.77–5.28)
RDW: 12.1 % (ref 11.7–15.4)
WBC: 6.1 10*3/uL (ref 3.4–10.8)

## 2018-10-19 LAB — METABOLIC PANEL, COMPREHENSIVE
A-G Ratio: 2.6 — ABNORMAL HIGH (ref 1.2–2.2)
ALT (SGPT): 38 IU/L — ABNORMAL HIGH (ref 0–32)
AST (SGOT): 25 IU/L (ref 0–40)
Albumin: 4.6 g/dL (ref 3.8–4.8)
Alk. phosphatase: 98 IU/L (ref 39–117)
BUN/Creatinine ratio: 16 (ref 12–28)
BUN: 15 mg/dL (ref 8–27)
Bilirubin, total: 0.4 mg/dL (ref 0.0–1.2)
CO2: 21 mmol/L (ref 20–29)
Calcium: 9.3 mg/dL (ref 8.7–10.3)
Chloride: 109 mmol/L — ABNORMAL HIGH (ref 96–106)
Creatinine: 0.93 mg/dL (ref 0.57–1.00)
GFR est AA: 74 mL/min/{1.73_m2} (ref >59)
GFR est non-AA: 64 mL/min/{1.73_m2} (ref >59)
GLOBULIN, TOTAL: 1.8 g/dL (ref 1.5–4.5)
Glucose: 175 mg/dL — ABNORMAL HIGH (ref 65–99)
Potassium: 5 mmol/L (ref 3.5–5.2)
Protein, total: 6.4 g/dL (ref 6.0–8.5)
Sodium: 144 mmol/L (ref 134–144)

## 2018-10-19 LAB — METHYLMALONIC ACID: METHYLMALONIC ACID, SERUM: 152 nmol/L (ref 0–378)

## 2018-10-19 LAB — TSH AND FREE T4
T4, Free: 1.05 ng/dL (ref 0.82–1.77)
TSH: 4.16 u[IU]/mL (ref 0.450–4.500)

## 2018-10-21 NOTE — Telephone Encounter (Signed)
-----   Message from Edmund Hilda sent at 10/21/2018  1:57 PM EST -----  Regarding: Dr Luna Kitchens  General Message/Vendor Calls    Caller's first and last name:      Reason for call: Pt is reporting that the Rx called in to [facility] for the "Aimovig Auto Injector" is requiring a coverage review, which pt originally requested on 10/14/18 and was told that it will be taken care of. Pt has received notice from [facility] indicating that this was never received, it has to be received within one week or the order will be cancelled.      Callback required yes/no and why: yes      Best contact number(s):[phone]      Details to clarify the request:      Edmund Hilda

## 2018-10-22 NOTE — Telephone Encounter (Signed)
Aimovig approval from [facility] 09/22/18 - 10/22/2019.     Faxed to [facility] and scanned and emailed auth to [employer] to process.

## 2018-10-23 ENCOUNTER — Telehealth

## 2018-10-23 NOTE — Telephone Encounter (Signed)
Email message from [place] at [facility]:       I received the Rx, but if I were the patient, I'd get this through a retail pharmacy and use manufacturer's copay assistance.  The patient can fill a three month supply through mail order and pay $50 or the copay thorugh retail will be $25, but with copay assistance applied, she'll only pay $5.  Sooo, $50 for three months or a total of $15      Let me know how she'd like to proceed.       Forwarding to nurse to discuss w/ patient and identify a local pharmacy for Rx to be faxed or escribed.

## 2018-10-26 MED ORDER — ERENUMAB-AOOE 140 MG/ML SUBCUTANEOUS AUTO-INJECTOR
140 mg/mL | INJECTION | SUBCUTANEOUS | 3 refills | Status: DC
Start: 2018-10-26 — End: 2019-01-19

## 2018-10-26 NOTE — Addendum Note (Signed)
Addendum  Note by Jerrell Mylar at 10/26/18 0930                Author: Jerrell Mylar  Service: --  Author Type: Licensed Nurse       Filed: 10/26/18 0930  Encounter Date: 10/23/2018  Status: Signed          Editor: Jerrell Mylar (Licensed Nurse)          Addended by: Jerrell Mylar on: 10/26/2018 09:30 AM    Modules accepted: Orders

## 2018-10-26 NOTE — Telephone Encounter (Addendum)
Spoke with patient discussed copayment options patient will use [facility] Sallyanne Kuster escribed Aimovig Rx.  Faxed copay card to [facility] to be applied

## 2018-10-26 NOTE — Telephone Encounter (Addendum)
Please read my note from 10/26/2018 9:30 am

## 2018-10-26 NOTE — Telephone Encounter (Signed)
Aimovig Rx sent to [facility] along with copay card

## 2018-10-26 NOTE — Addendum Note (Signed)
Addended by: Jerrell Mylar on: 10/26/2018 09:30 AM     Modules accepted: Orders

## 2018-11-05 ENCOUNTER — Ambulatory Visit: Attending: Neurology | Primary: Family Medicine

## 2018-11-05 ENCOUNTER — Ambulatory Visit: Admit: 2018-11-05 | Payer: PRIVATE HEALTH INSURANCE | Attending: Neurology | Primary: Family Medicine

## 2018-11-05 DIAGNOSIS — G609 Hereditary and idiopathic neuropathy, unspecified: Secondary | ICD-10-CM

## 2018-11-05 NOTE — Procedures (Signed)
Procedures  by Doroteo Bradford, MD at 11/05/18 0900                Author: Doroteo Bradford, MD  Service: --  Author Type: Physician       Filed: 11/05/18 1011  Encounter Date: 11/05/2018  Status: Addendum          Editor: Doroteo Bradford, MD (Physician)          Related Notes: Original Note by Doroteo Bradford, MD (Physician) filed at 11/05/18 1004                       ELECTRODIAGNOSTIC REPORT         Test Date:  11/05/2018             Patient:  Kayla Shaffer, Kayla Shaffer  DOB:  Jul 29, 1951  Physician:  Baldwin Jamaica, M.D.            ID#:  [id number]  SEX:  Female  Ref. Phys:  Baldwin Jamaica, M.D.        Patient History / Exam:   Kayla Shaffer 68 y.o.  female presents with bilateral leg numbness.  Query neuropathy      EMG & NCV Findings:   Evaluation of the right Fibular motor nerve showed prolonged distal onset latency (7.7 ms), normal amplitude (1.6 mV), normal conduction velocity (B Fib-Ankle, 43 m/s), and normal conduction velocity (Poplt-B Fib, 48 m/s).  The right tibial motor nerve  showed normal distal onset latency (4.2 ms), normal amplitude (9.4 mV), and normal conduction velocity (Knee-Ankle, 54 m/s).  The right ulnar motor nerve showed normal distal onset latency (2.7 ms), normal amplitude (8.0 mV), decreased conduction velocity  (Wrist-Abd Dig Minimi, 30 m/s), normal conduction velocity (B Elbow-Wrist, 64 m/s), and normal conduction velocity (A Elbow-B Elbow, 67 m/s).  The right radial sensory and the right sural sensory nerves showed normal distal peak latency (R2.3, R3.6 ms)  and normal amplitude (R10.0, R4.0 ??V).  The right Sup Fibular sensory nerve showed normal distal peak latency (3.1 ms), normal amplitude (5.4 ??V), and normal conduction velocity (Lower leg-Lat ankle, 37 m/s).        All F Wave latencies were within normal limits.        All examined muscles (as indicated in the following table) showed no evidence of electrical instability.        Impression:   This is a normal study.  There is no  electrophysiological evidence of a length dependent peripheral neuropathy.            ___________________________   S. Sallee Lange, M.D.      Nerve Conduction Studies   Anti Sensory Summary Table                   Stim Site  NR  Onset (ms)  Peak (ms)  O-P Amp (??V)  Norm Peak (ms)  Norm O-P Amp  Site1  Site2  Dist (cm)  Norm Vel (m/s)       Right Radial Anti Sensory (Base 1st Digit)                 Wrist      1.8  2.3  10.0  <2.8  7  Wrist  Base 1st Digit  10.0         Right Sup Fibular Anti Sensory (Lat ankle)                 Lower leg  2.7  3.1  5.4  <4.4  >5.0  Lower leg  Lat ankle  10.0  >32       Right Sural Anti Sensory (Lat Mall)                 Calf      3.1  3.6  4.0  <4.5  >4.0  Calf  Lat Mall  14.0          Motor Summary Table                   Stim Site  NR  Onset (ms)  Norm Onset (ms)  O-P Amp (mV)  Norm O-P Amp  P-T Amp (mV)  Site1  Site2  Dist (cm)  Vel (m/s)       Right Fibular Motor (Ext Dig Brev)                 Ankle      7.7  <6.5  1.6  >1.1    Ankle  Ext Dig Brev  8.0  10                 B Fib      14.6    1.1      B Fib  Ankle  30.0  43                 Poplt      16.7    1.1      Poplt  B Fib  10.0  48       Right Tibial Motor (Abd Hall Brev)                 Ankle      4.2  <6.1  9.4  >1.1    Ankle  Abd Hall Brev  8.0  19                 Knee      10.7    7.6      Knee  Ankle  35.0  54       Right Ulnar Motor (Abd Dig Minimi)                 Wrist      2.7  <3.1  8.0  >7.0    Wrist  Abd Dig Minimi  8.0  30                 B Elbow      5.5    7.4      B Elbow  Wrist  18.0  64                 A Elbow      7.0    7.0      A Elbow  B Elbow  10.0  67        F Wave Studies             NR  F-Lat (ms)  Lat Norm (ms)  L-R F-Lat (ms)  L-R Lat Norm       Right Tibial (Mrkrs) (Abd Hallucis)               41.53  <56    <5.7           EMG                    Side  Muscle  Nerve  Root  Ins Act  Fibs  Psw  Recrt  Duration  Amp  Poly  Comment                  Right  AntTibialis  Dp Br Peron  L4-5  Nml  Nml  Nml   Nml  Nml  Nml  Nml       Right  GluteusMed  SupGluteal  L4-S1  Nml  Nml  Nml  Nml  Nml  Nml  Nml       Right  VastusLat  Femoral  L2-4  Nml  Nml  Nml  Nml  Nml  Nml  Nml       Right  1stDorInt  Ulnar  C8-T1  Nml  Nml  Nml  Nml  Nml  Nml  Nml                    Right  Biceps  Musculocut  C5-6  Nml  Nml  Nml  Nml  Nml  Nml  Nml          Waveforms:

## 2018-11-05 NOTE — Procedures (Addendum)
ELECTRODIAGNOSTIC REPORT      Test Date:  11/05/2018    Patient: Kayla Shaffer, Kayla Shaffer DOB: 1951/07/21 Physician: Baldwin Jamaica, M.D.   ID#: [id number] SEX: Female Ref. Phys: Baldwin Jamaica, M.D.     Patient History / Exam:  Kayla Shaffer 68 y.o. female presents with bilateral leg numbness.  Query neuropathy    EMG & NCV Findings:  Evaluation of the right Fibular motor nerve showed prolonged distal onset latency (7.7 ms), normal amplitude (1.6 mV), normal conduction velocity (B Fib-Ankle, 43 m/s), and normal conduction velocity (Poplt-B Fib, 48 m/s).  The right tibial motor nerve showed normal distal onset latency (4.2 ms), normal amplitude (9.4 mV), and normal conduction velocity (Knee-Ankle, 54 m/s).  The right ulnar motor nerve showed normal distal onset latency (2.7 ms), normal amplitude (8.0 mV), decreased conduction velocity (Wrist-Abd Dig Minimi, 30 m/s), normal conduction velocity (B Elbow-Wrist, 64 m/s), and normal conduction velocity (A Elbow-B Elbow, 67 m/s).  The right radial sensory and the right sural sensory nerves showed normal distal peak latency (R2.3, R3.6 ms) and normal amplitude (R10.0, R4.0 ??V).  The right Sup Fibular sensory nerve showed normal distal peak latency (3.1 ms), normal amplitude (5.4 ??V), and normal conduction velocity (Lower leg-Lat ankle, 37 m/s).      All F Wave latencies were within normal limits.      All examined muscles (as indicated in the following table) showed no evidence of electrical instability.      Impression:  This is a normal study.  There is no electrophysiological evidence of a length dependent peripheral neuropathy.        ___________________________  S. Sallee Lange, M.D.    Nerve Conduction Studies  Anti Sensory Summary Table     Stim Site NR Onset (ms) Peak (ms) O-P Amp (??V) Norm Peak (ms) Norm O-P Amp Site1 Site2 Dist (cm) Norm Vel (m/s)   Right Radial Anti Sensory (Base 1st Digit)   Wrist    1.8 2.3 10.0 <2.8 7 Wrist Base 1st Digit 10.0     Right Sup Fibular Anti Sensory (Lat ankle)   Lower leg    2.7 3.1 5.4 <4.4 >5.0 Lower leg Lat ankle 10.0 >32   Right Sural Anti Sensory (Lat Mall)   Calf    3.1 3.6 4.0 <4.5 >4.0 Calf Lat Mall 14.0      Motor Summary Table     Stim Site NR Onset (ms) Norm Onset (ms) O-P Amp (mV) Norm O-P Amp P-T Amp (mV) Site1 Site2 Dist (cm) Vel (m/s)   Right Fibular Motor (Ext Dig Brev)   Ankle    7.7 <6.5 1.6 >1.1  Ankle Ext Dig Brev 8.0 10   B Fib    14.6  1.1   B Fib Ankle 30.0 43   Poplt    16.7  1.1   Poplt B Fib 10.0 48   Right Tibial Motor (Abd Hall Brev)   Ankle    4.2 <6.1 9.4 >1.1  Ankle Abd Hall Brev 8.0 19   Knee    10.7  7.6   Knee Ankle 35.0 54   Right Ulnar Motor (Abd Dig Minimi)   Wrist    2.7 <3.1 8.0 >7.0  Wrist Abd Dig Minimi 8.0 30   B Elbow    5.5  7.4   B Elbow Wrist 18.0 64   A Elbow    7.0  7.0   A Elbow B Elbow 10.0 3     F Wave Studies  NR F-Lat (ms) Lat Norm (ms) L-R F-Lat (ms) L-R Lat Norm   Right Tibial (Mrkrs) (Abd Hallucis)      41.53 <56  <5.7       EMG     Side Muscle Nerve Root Ins Act Fibs Psw Recrt Duration Amp Poly Comment   Right AntTibialis Dp Br Peron L4-5 Nml Nml Nml Nml Nml Nml Nml    Right GluteusMed SupGluteal L4-S1 Nml Nml Nml Nml Nml Nml Nml    Right VastusLat Femoral L2-4 Nml Nml Nml Nml Nml Nml Nml    Right 1stDorInt Ulnar C8-T1 Nml Nml Nml Nml Nml Nml Nml    Right Biceps Musculocut C5-6 Nml Nml Nml Nml Nml Nml Nml      Waveforms:

## 2018-12-21 ENCOUNTER — Telehealth: Attending: Internal Medicine | Primary: Family Medicine

## 2018-12-21 ENCOUNTER — Telehealth
Admit: 2018-12-21 | Discharge: 2018-12-21 | Payer: PRIVATE HEALTH INSURANCE | Attending: Internal Medicine | Primary: Family Medicine

## 2018-12-21 DIAGNOSIS — G4733 Obstructive sleep apnea (adult) (pediatric): Secondary | ICD-10-CM

## 2018-12-21 NOTE — Progress Notes (Signed)
Patient can be reached at [phone]

## 2018-12-21 NOTE — Progress Notes (Signed)
Progress Notes by Lincoln Brigham, MD at 12/21/18 1100                Author: Lincoln Brigham, MD  Service: --  Author Type: Physician       Filed: 12/21/18 1131  Encounter Date: 12/21/2018  Status: Signed          Editor: Lincoln Brigham, MD (Physician)                                                [address] [place], [place] [address]   Tel.  [phone]   Fax. [phone]  [address]   [place], [place] [address]   Tel.  [phone]   Fax. [phone]  [address]   [place], [place] [address]   Tel.  [phone]   Fax. [phone]                Subjective:            Telemedicine visit performed with verbal consent of the patient.   ID confirmed with date of birth and driver's license provided by patient.    Patient was seen at home      Kayla Shaffer is a 68 y.o.  female who was seen by synchronous (real-time) audio-video technology on 12/21/2018.        Consent:   She and/or her healthcare decision  maker is aware that this patient-initiated Telehealth encounter is the equivalent to a face to face encounter in the sleep disorder center and has provided verbal consent to proceed: Yes      I was at home while conducting this encounter.         Kayla Shaffer is an 68 y.o.  female referred for evaluation for a sleep disorder.          She complains of snoring, snorting associated with excessive daytime sleepiness.  Symptoms began several years ago, gradually worsening since  that time. She usually can fall asleep in a few minutes.  Family or house members note snoring.  She denies falling asleep while driving.   Kayla Shaffer does not wake up  frequently at night. She is bothered  by waking up too early and left unable to get back to sleep. She actually sleeps about    hours at night and wakes up about   times during the night.  She does not work shifts:   .    Kayla Shaffer indicates she  does get too little sleep at night. Her bedtime is  0200. She awakens at 0900 . She  does take naps.  She takes 7 naps a week lasting  3, Hour(s). She has the following observed behaviors:  Loud snoring, Light snoring, Twitching of legs or feet, Pauses in breathing, Grinding teeth, Sleep talking, Head rocking or banging, Kicking with legs, Biting tongue, Becoming very rigid and/or shaking(Vivid dreams, waking with gasp) ;  .   Other remarks:        Epworth Sleepiness Score: 19 which reflect severe  daytime drowsiness.        Allergies        Allergen  Reactions         ?  Ceftin [Cefuroxime Axetil]  Anaphylaxis             Throat almost  closed off         ?  Bactrim [Sulfamethoprim]  Swelling             Throat swelling         ?  Codeine  Swelling             throat         ?  Sulfa (Sulfonamide Antibiotics)  Swelling             Swollen throat              Current Outpatient Medications:    ?  erenumab-aooe (AIMOVIG AUTOINJECTOR) 140 mg/mL injection, 1 mL by SubCUTAneous route every thirty (30) days., Disp: 3 Syringe, Rfl: 3   ?  erenumab-aooe (AIMOVIG AUTOINJECTOR) 140 mg/mL injection, 1 mL by SubCUTAneous route every thirty (30) days., Disp: 1 Syringe, Rfl: 0   ?  pantoprazole (PROTONIX) 40 mg tablet, Take 40 mg by mouth daily., Disp: , Rfl:    ?  atorvastatin (LIPITOR) 40 mg tablet, Take 40 mg by mouth nightly., Disp: , Rfl:    ?  buPROPion XL (WELLBUTRIN XL) 300 mg XL tablet, Take 300 mg by mouth every morning., Disp: , Rfl:    ?  DULoxetine (CYMBALTA) 20 mg capsule, Take 20 mg by mouth nightly., Disp: , Rfl:    ?  nizatidine (AXID PO), Take 300 mg by mouth nightly., Disp: , Rfl:    ?  estradiol (VAGIFEM) 10 mcg tab vaginal tablet, Insert 10 mcg into vagina every seven (7) days., Disp: , Rfl:    ?  topiramate (TOPAMAX) 25 mg tablet, Take 50 mg by mouth two (2) times a day. Taking 1 tab in morning and 2 tabs at night, Disp: , Rfl:    ?  denosumab (PROLIA) 60 mg/mL injection, 60 mg by SubCUTAneous route every 6 months., Disp: , Rfl:    ?  ZOLMitriptan (ZOMIG ZMT) 5 mg disintegrating tablet, Take  5 mg by mouth as needed for Migraine., Disp: , Rfl:    ?  fluticasone (FLOVENT HFA) 110 mcg/actuation inhaler, Take 2 Puffs by inhalation every twelve (12) hours., Disp: , Rfl:    ?  albuterol (PROVENTIL HFA) 90 mcg/actuation inhaler, Take 2 Puffs by inhalation as needed for Wheezing., Disp: , Rfl:         She  has a past medical history of Asthma, Barrett esophagus, Diabetes (HCC), Hiatal  hernia, Migraine, Osteoarthritis, and Osteoporosis.      She  has a past surgical history that includes hx hysterectomy; hx breast biopsy (Left);  hx cataract removal (Bilateral, 2016); hx appendectomy; hx carpal tunnel release (Bilateral); hx open reduction internal fixation (Right, 1993); hx open reduction internal fixation (Left, 2014); hx intracranial aneurysm repair (Right, 10/2015); hx colonoscopy;  hx endoscopy; and colonoscopy (N/A, 09/18/2017).      She family history includes Breast Cancer in her paternal grandmother; Dementia in her  mother; Headache in her mother; Heart Disease in her father; Parkinsonism in her father; Stroke in her father.      She  reports that she has quit smoking. She has never used smokeless tobacco. She reports  current alcohol use. She reports that she does not use drugs.       Review of Systems:   Constitutional:  +weight gain   Eyes:  No blurred vision   CVS:  No significant chest pain   Pulm:  No significant shortness of breath   GI:  No significant  nausea or vomiting   GU:  No significant nocturia   Musculoskeletal: ++significant joint pain at night   Skin:  No significant rashes   Neuro:  No significant dizziness    Psych:  No active mood issues      Sleep Review of Systems: notable for no difficulty falling asleep; +frequent awakenings at night;  rare dreaming noted; no nightmares ; + early  morning headaches; + memory problems; + concentration issues;         Objective:        Visit Vitals      BP  124/86 (BP 1 Location: Left arm, BP Patient Position: Sitting)     Ht  5\' 2"  (1.575 m)      Wt  192 lb (87.1 kg)     SpO2  100%        BMI  35.12 kg/m??      Neck circ. in "inches": 15         Vital Signs: (As obtained by patient/caregiver at home)            Constitutional: [x]   Appears well-developed and well-nourished [x]  No apparent  distress       []   Abnormal -       Mental status: [x]  Alert and awake  [x]  Oriented to person/place/time [x]   Able to follow commands     []  Abnormal -       Eyes:   EOM    [x]   Normal    []  Abnormal -    Sclera  [x]    Normal    []  Abnormal -           Discharge [x]    None visible   []  Abnormal -       HENT: [x]  Normocephalic, atraumatic  []  Abnormal -    [x]  Mouth/Throat: Mucous membranes  are moist                Class 3 oropharynx, thick tongue base, narrow tonsillar pillars, tonsils absent                   Low-lying soft palate,no retrognathia      External Ears [x]  Normal  []  Abnormal -      Neck: [x]  No visualized mass []  Abnormal -       Pulmonary/Chest: [x]  Respiratory effort normal   [x]  No visualized signs of difficulty breathing or respiratory distress         []   Abnormal -              Neurological:        [x]   No Facial Asymmetry (Cranial nerve 7 motor function) (limited exam due to video visit)           [x]   No gaze palsy         []  Abnormal -            Skin:        [x]   No significant exanthematous lesions or discoloration noted on facial skin          []   Abnormal -              Psychiatric:       [x]  Normal Affect []  Abnormal -          Other pertinent observable physical exam findings:-                 Assessment:  ICD-10-CM  ICD-9-CM             1.  Obstructive sleep apnea (adult) (pediatric)  G47.33  327.23  SLEEP STUDY UNATTENDED, 4 CHANNEL           2.  Obesity, Class II, BMI 35-39.9  E66.9  278.00                  Plan:           * Sleep testing was ordered for initial evaluation.     * She was provided information on sleep apnea including coresponding risk factors and the importance of proper treatment.    * Treatment options for  sleep apnea were reviewed today. Patient agrees to a trial of PAP therapy if indicated.   * Counseling was provided regarding proper sleep hygiene, appropriate sleep schedule, need for sleep environment safety and safe driving.   * Effect of sleep disturbance on weight was reviewed. We have recommended a dedicated weight loss through appropriate diet and an exercise regimen as significant weight reduction has been shown to reduce severity of obstructive sleep apnea.       * Patient agrees to telephone follow-up by sleep technologist shortly after sleep study to review results and plan final management.       The treatment plan was reviewed with the patient in detail and reviewed with the patient.. she understands that the lead technologist will be  calling her  with the results and assisting with the next step in the treatment plan as outlined today during the consultation with me. All of  her questions were addressed.       2. Obesity - I have counseled the patient regarding the benefits of weight loss.         Thank you for allowing [place] to participate in your patient's medical care.  We'll keep you updated on these investigations.      Pursuant to the emergency declaration under the [facility] Act and the [employer], 1135 waiver authority and the [employer] Act,  this Virtual  Visit was conducted, with patient's consent, to reduce the patient's risk of exposure to COVID-19 and provide continuity of care for an established patient.       Services were provided through a video synchronous discussion virtually to substitute for in-person clinic visit.      Lincoln Brigham, MD         Electronically signed by      Miguel Aschoff, MD   Diplomate in Sleep Medicine   [facility]

## 2018-12-21 NOTE — Progress Notes (Signed)
Added to schedule on 01/22/19 to ship device

## 2018-12-21 NOTE — Progress Notes (Signed)
Patient can be reached at [phone]

## 2018-12-21 NOTE — Progress Notes (Signed)
[address] [place], [place] [address]  Tel.  [phone]  Fax. [phone] [address]  [place], [place] [address]  Tel.  [phone]  Fax. [phone] [address]  [place], [place] [address]  Tel.  [phone]  Fax. [phone]         Subjective:        Telemedicine visit performed with verbal consent of the patient.  ID confirmed with date of birth and driver's license provided by patient.   Patient was seen at home    Kayla Shaffer is a 68 y.o. female who was seen by synchronous (real-time) audio-video technology on 12/21/2018.      Consent:  She and/or her healthcare decision maker is aware that this patient-initiated Telehealth encounter is the equivalent to a face to face encounter in the sleep disorder center and has provided verbal consent to proceed: Yes    I was at home while conducting this encounter.      Kayla Shaffer is an 68 y.o. female referred for evaluation for a sleep disorder.       She complains of snoring, snorting associated with excessive daytime sleepiness.  Symptoms began several years ago, gradually worsening since that time. She usually can fall asleep in a few minutes.  Family or house members note snoring. She denies falling asleep while driving.  Kayla Shaffer does not wake up frequently at night. She is bothered by waking up too early and left unable to get back to sleep. She actually sleeps about   hours at night and wakes up about   times during the night. She does not work shifts:  .   Kayla Shaffer indicates she does get too little sleep at night. Her bedtime is 0200. She awakens at 0900. She does take naps. She takes 7 naps a week lasting 3, Hour(s). She has the following observed behaviors: Loud snoring, Light snoring, Twitching of legs or feet, Pauses in breathing, Grinding teeth, Sleep talking, Head rocking or banging, Kicking with legs, Biting tongue, Becoming very rigid and/or shaking(Vivid dreams, waking with gasp);  Marland Kitchen   Other remarks:      Epworth Sleepiness Score: 19 which reflect severe daytime drowsiness.    Allergies   Allergen Reactions   ??? Ceftin [Cefuroxime Axetil] Anaphylaxis     Throat almost closed off   ??? Bactrim [Sulfamethoprim] Swelling     Throat swelling   ??? Codeine Swelling     throat   ??? Sulfa (Sulfonamide Antibiotics) Swelling     Swollen throat         Current Outpatient Medications:   ???  erenumab-aooe (AIMOVIG AUTOINJECTOR) 140 mg/mL injection, 1 mL by SubCUTAneous route every thirty (30) days., Disp: 3 Syringe, Rfl: 3  ???  erenumab-aooe (AIMOVIG AUTOINJECTOR) 140 mg/mL injection, 1 mL by SubCUTAneous route every thirty (30) days., Disp: 1 Syringe, Rfl: 0  ???  pantoprazole (PROTONIX) 40 mg tablet, Take 40 mg by mouth daily., Disp: , Rfl:   ???  atorvastatin (LIPITOR) 40 mg tablet, Take 40 mg by mouth nightly., Disp: , Rfl:   ???  buPROPion XL (WELLBUTRIN XL) 300 mg XL tablet, Take 300 mg by mouth every morning., Disp: , Rfl:   ???  DULoxetine (CYMBALTA) 20 mg capsule, Take 20 mg by mouth nightly., Disp: , Rfl:   ???  nizatidine (AXID PO), Take 300 mg by mouth nightly., Disp: , Rfl:   ???  estradiol (VAGIFEM) 10 mcg tab vaginal tablet, Insert 10 mcg into  vagina every seven (7) days., Disp: , Rfl:   ???  topiramate (TOPAMAX) 25 mg tablet, Take 50 mg by mouth two (2) times a day. Taking 1 tab in morning and 2 tabs at night, Disp: , Rfl:   ???  denosumab (PROLIA) 60 mg/mL injection, 60 mg by SubCUTAneous route every 6 months., Disp: , Rfl:   ???  ZOLMitriptan (ZOMIG ZMT) 5 mg disintegrating tablet, Take 5 mg by mouth as needed for Migraine., Disp: , Rfl:   ???  fluticasone (FLOVENT HFA) 110 mcg/actuation inhaler, Take 2 Puffs by inhalation every twelve (12) hours., Disp: , Rfl:   ???  albuterol (PROVENTIL HFA) 90 mcg/actuation inhaler, Take 2 Puffs by inhalation as needed for Wheezing., Disp: , Rfl:      She  has a past medical history of Asthma, Barrett esophagus, Diabetes  (HCC), Hiatal hernia, Migraine, Osteoarthritis, and Osteoporosis.    She  has a past surgical history that includes hx hysterectomy; hx breast biopsy (Left); hx cataract removal (Bilateral, 2016); hx appendectomy; hx carpal tunnel release (Bilateral); hx open reduction internal fixation (Right, 1993); hx open reduction internal fixation (Left, 2014); hx intracranial aneurysm repair (Right, 10/2015); hx colonoscopy; hx endoscopy; and colonoscopy (N/A, 09/18/2017).    She family history includes Breast Cancer in her paternal grandmother; Dementia in her mother; Headache in her mother; Heart Disease in her father; Parkinsonism in her father; Stroke in her father.    She  reports that she has quit smoking. She has never used smokeless tobacco. She reports current alcohol use. She reports that she does not use drugs.     Review of Systems:  Constitutional:  +weight gain  Eyes:  No blurred vision  CVS:  No significant chest pain  Pulm:  No significant shortness of breath  GI:  No significant nausea or vomiting  GU:  No significant nocturia  Musculoskeletal: ++significant joint pain at night  Skin:  No significant rashes  Neuro:  No significant dizziness   Psych:  No active mood issues    Sleep Review of Systems: notable for no difficulty falling asleep; +frequent awakenings at night;  rare dreaming noted; no nightmares ; + early morning headaches; + memory problems; + concentration issues;     Objective:     Visit Vitals  BP 124/86 (BP 1 Location: Left arm, BP Patient Position: Sitting)   Ht 5\' 2"  (1.575 m)   Wt 192 lb (87.1 kg)   SpO2 100%   BMI 35.12 kg/m??    Neck circ. in "inches": 15      Vital Signs: (As obtained by patient/caregiver at home)        Constitutional: [x]  Appears well-developed and well-nourished [x]  No apparent distress      []  Abnormal -     Mental status: [x]  Alert and awake  [x]  Oriented to person/place/time [x]  Able to follow commands    []  Abnormal -     Eyes:   EOM    [x]   Normal    []  Abnormal -    Sclera  [x]   Normal    []  Abnormal -          Discharge [x]   None visible   []  Abnormal -     HENT: [x]  Normocephalic, atraumatic  []  Abnormal -   [x]  Mouth/Throat: Mucous membranes are moist               Class 3 oropharynx, thick tongue base, narrow tonsillar pillars, tonsils absent  Low-lying soft palate,no retrognathia    External Ears [x]  Normal  []  Abnormal -    Neck: [x]  No visualized mass []  Abnormal -     Pulmonary/Chest: [x]  Respiratory effort normal   [x]  No visualized signs of difficulty breathing or respiratory distress        []  Abnormal -          Neurological:        [x]  No Facial Asymmetry (Cranial nerve 7 motor function) (limited exam due to video visit)          [x]  No gaze palsy        []  Abnormal -          Skin:        [x]  No significant exanthematous lesions or discoloration noted on facial skin         []  Abnormal -            Psychiatric:       [x]  Normal Affect []  Abnormal -       Other pertinent observable physical exam findings:-          Assessment:       ICD-10-CM ICD-9-CM    1. Obstructive sleep apnea (adult) (pediatric) G47.33 327.23 SLEEP STUDY UNATTENDED, 4 CHANNEL   2. Obesity, Class II, BMI 35-39.9 E66.9 278.00          Plan:       * Sleep testing was ordered for initial evaluation.    * She was provided information on sleep apnea including coresponding risk factors and the importance of proper treatment.   * Treatment options for sleep apnea were reviewed today. Patient agrees to a trial of PAP therapy if indicated.  * Counseling was provided regarding proper sleep hygiene, appropriate sleep schedule, need for sleep environment safety and safe driving.  * Effect of sleep disturbance on weight was reviewed. We have recommended a dedicated weight loss through appropriate diet and an exercise regimen as significant weight reduction has been shown to reduce severity of obstructive sleep apnea.     * Patient agrees to telephone follow-up by sleep technologist shortly  after sleep study to review results and plan final management.     The treatment plan was reviewed with the patient in detail and reviewed with the patient.. she understands that the lead technologist will be calling her  with the results and assisting with the next step in the treatment plan as outlined today during the consultation with me. All of her questions were addressed.     2. Obesity - I have counseled the patient regarding the benefits of weight loss.      Thank you for allowing [place] to participate in your patient's medical care.  We'll keep you updated on these investigations.    Pursuant to the emergency declaration under the [facility] Act and the [employer], 1135 waiver authority and the [employer] Act, this Virtual  Visit was conducted, with patient's consent, to reduce the patient's risk of exposure to COVID-19 and provide continuity of care for an established patient.     Services were provided through a video synchronous discussion virtually to substitute for in-person clinic visit.    Lincoln Brigham, MD      Electronically signed by    Miguel Aschoff, MD  Diplomate in Sleep Medicine  [facility]

## 2018-12-21 NOTE — Patient Instructions (Signed)
[address]  [place] [address]  Tel.  [phone]  Fax. [phone] [address]  [place] [address]  Tel.  [phone]  Fax. [phone] [address] [place] [address]  Tel.  [phone]  Fax. [phone]     Sleep Apnea: After Your Visit  Your Care Instructions  Sleep apnea occurs when you frequently stop breathing for 10 seconds or longer during sleep. It can be mild to severe, based on the number of times per hour that you stop breathing or have slowed breathing. Blocked or narrowed airways in your nose, mouth, or throat can cause sleep apnea. Your airway can become blocked when your throat muscles and tongue relax during sleep.  Sleep apnea is common, occurring in 1 out of 20 individuals.  Individuals having any of the following characteristics should be evaluated and treated right away due to high risk and detrimental consequences from untreated sleep apnea:  1. Obesity  2. Congestive Heart failure  3. Atrial Fibrillation  4. Uncontrolled Hypertension  5. Type II Diabetes  6. Night-time Arrhythmias  7. Stroke  8. Pulmonary Hypertension  9. High-risk Driving Populations (pilots, truck drivers, etc.)  10. Patients Considering Weight-loss Surgery    How do you know you have sleep apnea?  You probably have sleep apnea if you answer 'yes' to 3 or more of the following questions:  S - Have you been told that you Snore?   T - Are you often Tired during the day?  O - Has anyone Observed you stop breathing while sleeping?  P- Do you have (or are being treated for) high blood Pressure?    B - Are you obese (Body Mass Index > 35)?  A - Is your Age 50 years old or older?  N - Is your Neck size greater than 16 inches?  G - Are you female Gender?  A sleep physician can prescribe a breathing device that prevents tissues in the throat from blocking your airway. Or your doctor may recommend using a dental device (oral breathing device) to help keep your airway  open. In some cases, surgery may be needed to remove enlarged tissues in the throat.  Follow-up care is a key part of your treatment and safety. Be sure to make and go to all appointments, and call your doctor if you are having problems. It's also a good idea to know your test results and keep a list of the medicines you take.  How can you care for yourself at home?  ?? Lose weight, if needed. It may reduce the number of times you stop breathing or have slowed breathing.  ?? Go to bed at the same time every night.  ?? Sleep on your side. It may stop mild apnea. If you tend to roll onto your back, sew a pocket in the back of your pajama top. Put a tennis ball into the pocket, and stitch the pocket shut. This will help keep you from sleeping on your back.  ?? Avoid alcohol and medicines such as sleeping pills and sedatives before bed.  ?? Do not smoke. Smoking can make sleep apnea worse. If you need help quitting, talk to your doctor about stop-smoking programs and medicines. These can increase your chances of quitting for good.  ?? Prop up the head of your bed 4 to 6 inches by putting bricks under the legs of the bed.  ?? Treat breathing problems, such as a stuffy nose, caused   by a cold or allergies.  ?? Use a continuous positive airway pressure (CPAP) breathing machine if lifestyle changes do not help your apnea and your doctor recommends it. The machine keeps your airway from closing when you sleep.  ?? If CPAP does not help you, ask your doctor whether you should try other breathing machines. A bilevel positive airway pressure machine has two types of air pressure????????one for breathing in and one for breathing out. Another device raises or lowers air pressure as needed while you breathe.  ?? If your nose feels dry or bleeds when using one of these machines, talk with your doctor about increasing moisture in the air. A humidifier may help.  ?? If your nose is runny or stuffy from using a breathing machine, talk  with your doctor about using decongestants or a corticosteroid nasal spray.  When should you call for help?  Watch closely for changes in your health, and be sure to contact your doctor if:  ?? You still have sleep apnea even though you have made lifestyle changes.  ?? You are thinking of trying a device such as CPAP.  ?? You are having problems using a CPAP or similar machine.                Where can you learn more?   Go to http://www.healthwise.net/[facility].  Enter J936 in the search box to learn more about "Sleep Apnea: After Your Visit."   ?? 2006-2010 [employer]. Care instructions adapted under license by [facility] (which disclaims liability or warranty for this information). This care instruction is for use with your licensed healthcare professional. If you have questions about a medical condition or this instruction, always ask your healthcare professional. Healthwise disclaims any warranty or liability for your use of this information.      PROPER SLEEP HYGIENE    What to avoid  ?? Do not have drinks with caffeine, such as coffee or black tea, for 8 hours before bed.  ?? Do not smoke or use other types of tobacco near bedtime. Nicotine is a stimulant and can keep you awake.  ?? Avoid drinking alcohol late in the evening, because it can cause you to wake in the middle of the night.  ?? Do not eat a big meal close to bedtime. If you are hungry, eat a light snack.  ?? Do not drink a lot of water close to bedtime, because the need to urinate may wake you up during the night.  ?? Do not read or watch TV in bed. Use the bed only for sleeping and sexual activity.  What to try  ?? Go to bed at the same time every night, and wake up at the same time every morning. Do not take naps during the day.  ?? Keep your bedroom quiet, dark, and cool.  ?? Get regular exercise, but not within 3 to 4 hours of your bedtime..  ?? Sleep on a comfortable pillow and mattress.   ?? If watching the clock makes you anxious, turn it facing away from you so you cannot see the time.  ?? If you worry when you lie down, start a worry book. Well before bedtime, write down your worries, and then set the book and your concerns aside.  ?? Try meditation or other relaxation techniques before you go to bed.  ?? If you cannot fall asleep, get up and go to another room until you feel sleepy. Do something relaxing. Repeat your bedtime routine   before you go to bed again.  ?? Make your house quiet and calm about an hour before bedtime. Turn down the lights, turn off the TV, log off the computer, and turn down the volume on music. This can help you relax after a busy day.    Drowsy Driving  The [place] National Highway Traffic Safety Administration cites drowsiness as a causing factor in more than 100,000 police reported crashes annually, resulting in 76,000 injuries and 1,500 deaths. Other surveys suggest 55% of people polled have driven while drowsy in the past year, 23% had fallen asleep but not crashed, 3% crashed, and 2% had and accident due to drowsy driving.  Who is at risk?   Young Drivers: One study of drowsy driving accidents states that 55% of the drivers were under 25 years. Of those, 75% were female.   Shift Workers and Travelers: People who work overnight or travel across time zones frequently are at higher risk of experiencing Circadian Rhythm Disorders. They are trying to work and function when their body is programed to sleep.   Sleep Deprived: Lack of sleep has a serious impact on your ability to pay attention or focus on a task. Consistently getting less than the average of 8 hours your body needs creates partial or cumulative sleep deprivation.   Untreated Sleep Disorders: Sleep Apnea, Narcolepsy, R.L.S., and other sleep disorders (untreated) prevent a person from getting enough restful sleep. This leads to excessive daytime sleepiness and increases the risk  for drowsy driving accidents by up to 7 times.  Medications / Alcohol: Even over the counter medications can cause drowsiness. Medications that impair a drivers attention should have a warning label. Alcohol naturally makes you sleepy and on its own can cause accidents. Combined with excessive drowsiness its effects are amplified.   Signs of Drowsy Driving:   * You don't remember driving the last few miles   * You may drift out of your lane   * You are unable to focus and your thoughts wander   * You may yawn more often than normal   * You have difficulty keeping your eyes open / nodding off   * Missing traffic signs, speeding, or tailgating  Prevention-   Good sleep hygiene, lifestyle and behavioral choices have the most impact on drowsy driving. There is no substitute for sleep and the average person requires 8 hours nightly. If you find yourself driving drowsy, stop and sleep. Consider the sleep hygiene tips provided during your visit as well.     Medication Refill Policy: Refills for all medications require 1 week advance notice. Please have your pharmacy fax a refill request. We are unable to fax, or call in "controled substance" medications and you will need to pick these prescriptions up from our office.     MyChart Activation    Thank you for requesting access to MyChart. Please follow the instructions below to securely access and download your online medical record. MyChart allows you to send messages to your doctor, view your test results, renew your prescriptions, schedule appointments, and more.    How Do I Sign Up?    1. In your internet browser, go to [email or web address].  2. Click on the First Time User? Click Here link in the Sign In box. You will see the New Member Sign Up page.  3. Enter your MyChart Access Code exactly as it appears below. You will not need to use this code after you???ve completed the sign-up process. If    you do not sign up before the expiration date, you must request a new code.    MyChart Access Code: Activation code not generated  Current MyChart Status: Active (This is the date your MyChart access code will expire)    4. Enter the last four digits of your Social Security Number (xxxx) and Date of Birth (mm/dd/yyyy) as indicated and click Submit. You will be taken to the next sign-up page.  5. Create a MyChart ID. This will be your MyChart login ID and cannot be changed, so think of one that is secure and easy to remember.  6. Create a MyChart password. You can change your password at any time.  7. Enter your Password Reset Question and Answer. This can be used at a later time if you forget your password.   8. Enter your e-mail address. You will receive e-mail notification when new information is available in MyChart.  9. Click Sign Up. You can now view and download portions of your medical record.  10. Click the Download Summary menu link to download a portable copy of your medical information.    Additional Information    If you have questions, please call [phone]. Remember, MyChart is NOT to be used for urgent needs. For medical emergencies, dial 911.

## 2018-12-21 NOTE — Progress Notes (Signed)
Spoke to patient on 12/21/18 and she discussed with Dr.Santos that she would like device shipped to her home due to COVID 19 she doesn't feel comfortable coming to our office. She agrees to use the device the same day she receives it and return after 1 night usage.

## 2018-12-21 NOTE — Progress Notes (Signed)
Spoke to patient on 12/21/18 and she discussed with Dr.Santos that she would like device shipped to her home due to COVID 19 she doesn't feel comfortable coming to our office. She agrees to use the device the same day she receives it and return after 1 night usage.

## 2018-12-22 ENCOUNTER — Encounter: Attending: Internal Medicine | Primary: Family Medicine

## 2018-12-29 ENCOUNTER — Encounter: Attending: Internal Medicine | Primary: Family Medicine

## 2019-01-06 NOTE — Telephone Encounter (Signed)
-----   Message from Holdingford Houchens sent at 01/06/2019 11:45 AM EDT -----  Regarding: dr varma/ telephone  General Message/Vendor Calls    Caller's first and last name: pt      Reason for call: she has had a migraine for the past week and cant seem to do simple tasks with help       Callback required yes/no and why: yes      Best contact number(s): [phone]      Details to clarify the request:      Shaquan Houchens

## 2019-01-07 ENCOUNTER — Inpatient Hospital Stay
Admit: 2019-01-07 | Discharge: 2019-01-08 | Disposition: A | Discharge status: Home / Self Care | Payer: PRIVATE HEALTH INSURANCE | Attending: Emergency Medicine

## 2019-01-07 ENCOUNTER — Encounter

## 2019-01-07 ENCOUNTER — Emergency Department: Admit: 2019-01-07 | Payer: PRIVATE HEALTH INSURANCE | Primary: Family Medicine

## 2019-01-07 DIAGNOSIS — R2689 Other abnormalities of gait and mobility: Secondary | ICD-10-CM

## 2019-01-07 MED ORDER — LORAZEPAM 1 MG TAB
1 mg | ORAL | Status: AC
Start: 2019-01-07 — End: 2019-01-07
  Administered 2019-01-07: 23:00:00 via ORAL

## 2019-01-07 MED ORDER — METHYLPREDNISOLONE 4 MG TABS IN A DOSE PACK
4 mg | ORAL | 0 refills | Status: DC
Start: 2019-01-07 — End: 2019-01-19

## 2019-01-07 MED FILL — LORAZEPAM 1 MG TAB: 1 mg | ORAL | Qty: 2

## 2019-01-07 NOTE — ED Notes (Signed)
Pt presents w/ c/o dizziness nausea and loss of appetite x1 week.

## 2019-01-07 NOTE — ED Notes (Signed)
I have reviewed discharge instructions with the patient.  The patient verbalized understanding.

## 2019-01-07 NOTE — ED Provider Notes (Signed)
ED Provider Notes by Robert BellowWood, Janki Dike R, MD at 01/07/19 1959                Author: Robert BellowWood, Candy Ziegler R, MD  Service: --  Author Type: Physician       Filed: 01/07/19 2249  Date of Service: 01/07/19 1959  Status: Signed          Editor: Robert BellowWood, Brighid Koch R, MD (Physician)               EMERGENCY DEPARTMENT HISTORY AND PHYSICAL EXAM           Date: 01/07/2019   Patient Name: Kayla Shaffer        History of Presenting Illness          Chief Complaint       Patient presents with        ?  Referral / Consult             dr varmer sent to r\\o stroke        ?  Dizziness             started 4 days ago "feels drunk"        ?  Gait Problem             no balance         ?  Nausea             started monday           History Provided By: Patient      HPI: Kayla Shaffer,  68 y.o. female  With past medical history of migraines presenting today as a referral from  her neurologist for possible stroke.  The patient reports that around 4 days ago she woke up and started having a feeling of dizziness.  She states that it feels like she is drunk.  She is unable to ambulate as she has severe nausea and feels extremely  unsteady on her feet.  She has been using her husband to help walk around.  The patient denies any other associated symptoms.  No previous history of stroke.  Patient denies blood thinners.      There are no other complaints, changes, or physical findings at this time.      PCP: Other, Phys, MD        No current facility-administered medications on file prior to encounter.           Current Outpatient Medications on File Prior to Encounter          Medication  Sig  Dispense  Refill           ?  metFORMIN (GLUCOPHAGE) 500 mg tablet  Take 500 mg by mouth two (2) times daily (with meals). Take one 500mg  tablet in the morning, and one in the evening               ?  erenumab-aooe (AIMOVIG AUTOINJECTOR) 140 mg/mL injection  1 mL by SubCUTAneous route every thirty (30) days.  3 Syringe  3           ?  erenumab-aooe (AIMOVIG  AUTOINJECTOR) 140 mg/mL injection  1 mL by SubCUTAneous route every thirty (30) days.  1 Syringe  0     ?  pantoprazole (PROTONIX) 40 mg tablet  Take 40 mg by mouth daily. Take one 40mg  tablet in the morning, and one at night         ?  atorvastatin (LIPITOR)  40 mg tablet  Take 40 mg by mouth nightly.         ?  buPROPion XL (WELLBUTRIN XL) 300 mg XL tablet  Take 300 mg by mouth every morning.         ?  DULoxetine (CYMBALTA) 20 mg capsule  Take 20 mg by mouth nightly.         ?  nizatidine (AXID PO)  Take 300 mg by mouth nightly.         ?  estradiol (VAGIFEM) 10 mcg tab vaginal tablet  Insert 10 mcg into vagina every seven (7) days.         ?  topiramate (TOPAMAX) 25 mg tablet  Take 50 mg by mouth two (2) times a day. Taking 1 tab in morning and 2 tabs at night         ?  denosumab (PROLIA) 60 mg/mL injection  60 mg by SubCUTAneous route every 6 months.         ?  ZOLMitriptan (ZOMIG ZMT) 5 mg disintegrating tablet  Take 5 mg by mouth as needed for Migraine.         ?  fluticasone (FLOVENT HFA) 110 mcg/actuation inhaler  Take 2 Puffs by inhalation every twelve (12) hours.         ?  albuterol (PROVENTIL HFA) 90 mcg/actuation inhaler  Take 2 Puffs by inhalation as needed for Wheezing.               ?  methylPREDNISolone (MEDROL DOSEPACK) 4 mg tablet  According to package instructions  1 Dose Pack  0             Past History        Past Medical History:     Past Medical History:        Diagnosis  Date         ?  Asthma            last flare up over a year ago as stated 09/02/2017         ?  Barrett esophagus       ?  Diabetes (HCC)            no medications, just watching         ?  Hiatal hernia            sliding         ?  Migraine       ?  Osteoarthritis           ?  Osteoporosis             Past Surgical History:     Past Surgical History:         Procedure  Laterality  Date          ?  COLONOSCOPY  N/A  09/18/2017          COLONOSCOPY/EGD performed by Blake Divine, MD at [facility]          ?  HX  APPENDECTOMY         ?  HX BREAST BIOPSY  Left            Stereotactic biopsy. Benign. Long time ago. Clip.          ?  HX CARPAL TUNNEL RELEASE  Bilateral       ?  HX CATARACT REMOVAL  Bilateral  2016     ?  HX COLONOSCOPY         ?  HX ENDOSCOPY         ?  HX HYSTERECTOMY              a long time ago          ?  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  HX OPEN REDUCTION INTERNAL FIXATION  Right  1993          no hardware, wrist          ?  HX OPEN REDUCTION INTERNAL FIXATION  Left  2014          wrist, no hardware           Family History:     Family History         Problem  Relation  Age of Onset          ?  Breast Cancer  Paternal Grandmother                onset: 41s          ?  Dementia  Mother       ?  Headache  Mother       ?  Heart Disease  Father       ?  Parkinsonism  Father            ?  Stroke  Father             Social History:     Social History          Tobacco Use         ?  Smoking status:  Former Smoker     ?  Smokeless tobacco:  Never Used       Substance Use Topics         ?  Alcohol use:  Yes             Comment: socially, mixed drink         ?  Drug use:  No           Allergies:     Allergies        Allergen  Reactions         ?  Ceftin [Cefuroxime Axetil]  Anaphylaxis             Throat almost closed off         ?  Bactrim [Sulfamethoprim]  Swelling             Throat swelling         ?  Codeine  Swelling             throat         ?  Sulfa (Sulfonamide Antibiotics)  Swelling             Swollen throat                Review of Systems     Constitutional: No  fever   Skin: No  rash   HEENT: No  nasal congestion   Resp: No cough   CV: No chest pain   GI: No vomiting   GU: No dysuria   MSK: No joint pain   Neuro: No numbness, + dizziness  Psych: No suicidal           Physical Exam        Patient Vitals for the past 12 hrs:            Temp  Pulse  Resp  BP  SpO2            01/07/19 1702  98.1 ??F (36.7 ??C)   100  18  (!) 173/92  98 %        General: alert, No acute distress   Eyes: EOMI, normal conjunctiva   ENT: moist mucous membranes.   Neck: Active, full ROM of neck.   Skin: No rashes.no jaundice               Lungs: Equal chest expansion.no respiratory distress.    Heart: regular rate     no peripheral edema      Abd:  non distended soft    Back: Full ROM   MSK: Full, active ROM in all 4 extremities.    Neuro:    II: vision grossly intact   III, IV, VI: Extraocular motion intact, pupils reactive to light, bilateral lateral nystagmus   V: facial sensation intact   VII: face symmetric with normal eye closure and smile   VIII: hearing intact to finger rub bilaterally   IX, X: palate elevates symmetrically, phonation normal   XII: tongue midline with normal movements   Motor: normal bulk, tone, and strength bilaterally, no pronator drift   Sensory: normal sensation to light touch in bilateral upper and lower extremities   Coordination: intact to rapid alternating movements   Gait: steady gait with normal steps, and arm swing. Normal romberg.   Psych: Cooperative with exam; Appropriate mood and affect                  Diagnostic Study Results        Labs -         Recent Results (from the past 12 hour(s))     EKG, 12 LEAD, INITIAL          Collection Time: 01/07/19  5:12 PM         Result  Value  Ref Range            Ventricular Rate  101  BPM       Atrial Rate  101  BPM       P-R Interval  154  ms       QRS Duration  72  ms       Q-T Interval  344  ms       QTC Calculation (Bezet)  446  ms       Calculated P Axis  70  degrees       Calculated R Axis  11  degrees       Calculated T Axis  91  degrees       Diagnosis                 Sinus tachycardia   Right atrial enlargement   Abnormal QRS-T angle, consider primary T wave abnormality   When compared with ECG of 26-Apr-2018 12:13,   No significant change was found              Radiologic Studies -      MRI BRAIN WO CONT       Final Result     IMPRESSION: No significant  change in right frontal  lobe encephalomalacia with no     acute infarct, hemorrhage, or mass.            CT HEAD WO CONT       Final Result     IMPRESSION:           No acute intracranial abnormality on this noncontrast head CT.     Unchanged right frontal lobe chronic encephalomalacia.                      CT Results   (Last 48 hours)                                    01/07/19 1748    CT HEAD WO CONT  Final result            Impression:    IMPRESSION:              No acute intracranial abnormality on this noncontrast head CT.      Unchanged right frontal lobe chronic encephalomalacia.                              Narrative:    EXAM: CT HEAD WO CONT             INDICATION: Dizziness, nausea, imbalance, and anorexia for one week.              COMPARISON: CT head on 08/05/2018 and MRI brain on 10/14/2016.             TECHNIQUE: Noncontrast head CT. Coronal and sagittal reformats.  CT dose      reduction was achieved through the use of a standardized protocol tailored for      this examination and automatic exposure control for dose modulation.             FINDINGS: The ventricles and sulci are age-appropriate without hydrocephalus.      There is no mass effect or midline shift. There is no intracranial hemorrhage or      extra-axial fluid collection. Chronic right frontal lobe encephalomalacia is      unchanged. No CT evidence of acute infarct. No artifact arises from aneurysm      clip in the right MCA territory.             The postsurgical calvarium is intact. The visualized paranasal sinuses and      mastoid air cells are clear.                                 CXR Results   (Last 48 hours)          None                    Medical Decision Making     I am the first provider for this patient.      I reviewed the vital signs, available nursing notes, past medical history, past surgical history, family history and social history.      Provider Notes (Medical Decision Making):       Differential Diagnosis: Cerebral  stroke, BPPV, labyrinthitis, SAH, migraine      Initial Plan: CT head negative, reassess.      ED Course:    Initial assessment performed.  The patients presenting problems have been discussed, and they are in agreement with the care plan formulated and outlined with them.  I have encouraged them to ask questions as they arise throughout their visit.        ED Course as of Jan 07 2248       Thu Jan 07, 2019        1831  On my interpretation of the CT scan of the head no evidence of acute ischemic strokes.     [NW]     2031  On my interpretation the patient's MRI no evidence of stroke.     [NW]        2031  The patient presents today with difficulty with ambulation, dizziness, as a referral from her primary neurologist.  The patient has a negative CT scan,  also has negative MRI.     [NW]              ED Course User Index   [NW] Robert Bellow, MD           I, Robert Bellow, MD, am the attending of record for this patient encounter.      Dispo: Discharged. The patient has been re-evaluated and is ready for discharge. Reviewed available results with patient. Counseled patient on diagnosis and care plan. Patient has expressed understanding,  and all questions have been answered. Patient agrees with plan and agrees to follow up as recommended, or to return to the ED if their symptoms worsen. Discharge instructions have been provided and explained to the patient, along with reasons to return  to the ED.          PLAN:     Discharge Medication List as of 01/07/2019  8:30 PM              START taking these medications          Details        meclizine (ANTIVERT) 25 mg tablet  Take 1 Tab by mouth three (3) times daily as needed for Dizziness for up to 10 days., Normal, Disp-30 Tab, R-0                     CONTINUE these medications which have NOT CHANGED          Details        metFORMIN (GLUCOPHAGE) 500 mg tablet  Take 500 mg by mouth two (2) times daily (with meals). Take one  tablet in the morning, and one in the  evening, Historical Med               !! erenumab-aooe (AIMOVIG AUTOINJECTOR) 140 mg/mL injection  1 mL by SubCUTAneous route every thirty (30) days., Normal, Disp-3 Syringe, R-3               !! erenumab-aooe (AIMOVIG AUTOINJECTOR) 140 mg/mL injection  1 mL by SubCUTAneous route every thirty (30) days., Sample, Disp-1 Syringe, R-0               pantoprazole (PROTONIX) 40 mg tablet  Take 40 mg by mouth daily. Take one  tablet in the morning, and one at night, Historical Med               atorvastatin (LIPITOR) 40 mg tablet  Take 40 mg by mouth nightly., Historical Med               buPROPion XL (WELLBUTRIN XL) 300 mg XL  tablet  Take 300 mg by mouth every morning., Historical Med               DULoxetine (CYMBALTA) 20 mg capsule  Take 20 mg by mouth nightly., Historical Med               nizatidine (AXID PO)  Take 300 mg by mouth nightly., Historical Med               estradiol (VAGIFEM) 10 mcg tab vaginal tablet  Insert 10 mcg into vagina every seven (7) days., Historical Med               topiramate (TOPAMAX) 25 mg tablet  Take 50 mg by mouth two (2) times a day. Taking 1 tab in morning and 2 tabs at night, Historical Med               denosumab (PROLIA) 60 mg/mL injection  60 mg by SubCUTAneous route every 6 months., Historical Med               ZOLMitriptan (ZOMIG ZMT) 5 mg disintegrating tablet  Take 5 mg by mouth as needed for Migraine., Historical Med               fluticasone (FLOVENT HFA) 110 mcg/actuation inhaler  Take 2 Puffs by inhalation every twelve (12) hours., Historical Med               albuterol (PROVENTIL HFA) 90 mcg/actuation inhaler  Take 2 Puffs by inhalation as needed for Wheezing., Historical Med               methylPREDNISolone (MEDROL DOSEPACK) 4 mg tablet  According to package instructions, Normal, Disp-1 Dose Pack, R-0               !! - Potential duplicate medications found. Please discuss with provider.            1.     2.        Follow-up Information               Follow up With   Specialties  Details  Why  Contact Info              Brager, Marveen Reeks, MD  Otolaryngology  Schedule an appointment as soon as possible for a visit in 2 days    [address]   [address]   [place] [address]   [phone]                3.   Return to ED if worse            Diagnosis        Clinical Impression:       1.  Balance problem      2.  Benign paroxysmal positional vertigo, unspecified laterality         3.  Dizziness            Attestations:      Robert Bellow, MD      Please note that this dictation was completed with Dragon, the computer voice recognition software.  Quite often unanticipated grammatical, syntax, homophones, and other interpretive errors are  inadvertently transcribed by the computer software.  Please disregard these errors.  Please excuse any errors that have escaped final proofreading.  Thank you.

## 2019-01-07 NOTE — Progress Notes (Signed)
Nurse Clarification of the Prior to Admission Medication Regimen     The patient was interviewed regarding clarification of the prior to admission medication regimen.     The individual(s) listed above were questioned regarding the patient's use of any other inhalers, topical products, over the counter medications, herbal medications, vitamin products or ophthalmic/nasal/otic medication use.     Information Obtained From: Patient    Recommendations/Findings:   The following amendments were made to the patient's active medication list on file at [facility]:     1) Additions:   Marland Kitchen Metformin 500mg     2) Removals:   . None    3) Changes:  Marland Kitchen (Old regimen: Protonix 40mg  tablet take one tablet once a day /New regimen: Protonix 40mg  tablet, take one tablet in the morning and one at night)         PTA medication list was corrected to the following:     Prior to Admission Medications   Prescriptions Last Dose Informant Taking?   DULoxetine (CYMBALTA) 20 mg capsule   Yes   Sig: Take 20 mg by mouth nightly.   ZOLMitriptan (ZOMIG ZMT) 5 mg disintegrating tablet   Yes   Sig: Take 5 mg by mouth as needed for Migraine.   albuterol (PROVENTIL HFA) 90 mcg/actuation inhaler   Yes   Sig: Take 2 Puffs by inhalation as needed for Wheezing.   atorvastatin (LIPITOR) 40 mg tablet   Yes   Sig: Take 40 mg by mouth nightly.   buPROPion XL (WELLBUTRIN XL) 300 mg XL tablet   Yes   Sig: Take 300 mg by mouth every morning.   denosumab (PROLIA) 60 mg/mL injection   Yes   Sig: 60 mg by SubCUTAneous route every 6 months.   erenumab-aooe (AIMOVIG AUTOINJECTOR) 140 mg/mL injection   Yes   Sig: 1 mL by SubCUTAneous route every thirty (30) days.   erenumab-aooe (AIMOVIG AUTOINJECTOR) 140 mg/mL injection   Yes   Sig: 1 mL by SubCUTAneous route every thirty (30) days.   estradiol (VAGIFEM) 10 mcg tab vaginal tablet   Yes   Sig: Insert 10 mcg into vagina every seven (7) days.   fluticasone (FLOVENT HFA) 110 mcg/actuation inhaler   Yes   Sig: Take 2 Puffs by  inhalation every twelve (12) hours.   metFORMIN (GLUCOPHAGE) 500 mg tablet   Yes   Sig: Take 500 mg by mouth two (2) times daily (with meals). Take one 500mg  tablet in the morning, and one in the evening   methylPREDNISolone (MEDROL DOSEPACK) 4 mg tablet Not Taking at Unknown time  No   Sig: According to package instructions   nizatidine (AXID PO)   Yes   Sig: Take 300 mg by mouth nightly.   pantoprazole (PROTONIX) 40 mg tablet   Yes   Sig: Take 40 mg by mouth daily. Take one 40mg  tablet in the morning, and one at night   topiramate (TOPAMAX) 25 mg tablet   Yes   Sig: Take 50 mg by mouth two (2) times a day. Taking 1 tab in morning and 2 tabs at night      Facility-Administered Medications: None        Thank you,  Evern Core, RN

## 2019-01-07 NOTE — ED Notes (Signed)
Pt presents w/ c/o dizziness nausea and loss of appetite x1 week.

## 2019-01-07 NOTE — Telephone Encounter (Signed)
-----   Message from Harold Barban sent at 01/07/2019 12:10 PM EDT -----  Regarding: Dr. Luna Kitchens  General Message/Vendor Calls    Caller's first and last name: Yvonne Kendall      Reason for call:dizziness, unable to walk and no balance      Callback required yes/no and why:yes      Best contact number(s):[phone]      Details to clarify the request:Pt stated she is very dizzy,unable to walk, and no balance at all for a wk. Attempted to transfer, but no answer. Advised pt to "seek appropriate medical care".      Harold Barban

## 2019-01-07 NOTE — ED Provider Notes (Signed)
EMERGENCY DEPARTMENT HISTORY AND PHYSICAL EXAM      Date: 01/07/2019  Patient Name: Kayla Shaffer    History of Presenting Illness     Chief Complaint   Patient presents with   ??? Referral / Consult     dr varmer sent to r\\o stroke   ??? Dizziness     started 4 days ago "feels drunk"   ??? Gait Problem     no balance    ??? Nausea     started monday       History Provided By: Patient    HPI: Kayla Shaffer, 68 y.o. female  With past medical history of migraines presenting today as a referral from her neurologist for possible stroke.  The patient reports that around 4 days ago she woke up and started having a feeling of dizziness.  She states that it feels like she is drunk.  She is unable to ambulate as she has severe nausea and feels extremely unsteady on her feet.  She has been using her husband to help walk around.  The patient denies any other associated symptoms.  No previous history of stroke.  Patient denies blood thinners.    There are no other complaints, changes, or physical findings at this time.    PCP: Other, Phys, MD    No current facility-administered medications on file prior to encounter.      Current Outpatient Medications on File Prior to Encounter   Medication Sig Dispense Refill   ??? metFORMIN (GLUCOPHAGE) 500 mg tablet Take 500 mg by mouth two (2) times daily (with meals). Take one 500mg  tablet in the morning, and one in the evening     ??? erenumab-aooe (AIMOVIG AUTOINJECTOR) 140 mg/mL injection 1 mL by SubCUTAneous route every thirty (30) days. 3 Syringe 3   ??? erenumab-aooe (AIMOVIG AUTOINJECTOR) 140 mg/mL injection 1 mL by SubCUTAneous route every thirty (30) days. 1 Syringe 0   ??? pantoprazole (PROTONIX) 40 mg tablet Take 40 mg by mouth daily. Take one 40mg  tablet in the morning, and one at night     ??? atorvastatin (LIPITOR) 40 mg tablet Take 40 mg by mouth nightly.     ??? buPROPion XL (WELLBUTRIN XL) 300 mg XL tablet Take 300 mg by mouth every morning.      ??? DULoxetine (CYMBALTA) 20 mg capsule Take 20 mg by mouth nightly.     ??? nizatidine (AXID PO) Take 300 mg by mouth nightly.     ??? estradiol (VAGIFEM) 10 mcg tab vaginal tablet Insert 10 mcg into vagina every seven (7) days.     ??? topiramate (TOPAMAX) 25 mg tablet Take 50 mg by mouth two (2) times a day. Taking 1 tab in morning and 2 tabs at night     ??? denosumab (PROLIA) 60 mg/mL injection 60 mg by SubCUTAneous route every 6 months.     ??? ZOLMitriptan (ZOMIG ZMT) 5 mg disintegrating tablet Take 5 mg by mouth as needed for Migraine.     ??? fluticasone (FLOVENT HFA) 110 mcg/actuation inhaler Take 2 Puffs by inhalation every twelve (12) hours.     ??? albuterol (PROVENTIL HFA) 90 mcg/actuation inhaler Take 2 Puffs by inhalation as needed for Wheezing.     ??? methylPREDNISolone (MEDROL DOSEPACK) 4 mg tablet According to package instructions 1 Dose Pack 0       Past History     Past Medical History:  Past Medical History:   Diagnosis Date   ??? Asthma  last flare up over a year ago as stated 09/02/2017   ??? Barrett esophagus    ??? Diabetes (HCC)     no medications, just watching   ??? Hiatal hernia     sliding   ??? Migraine    ??? Osteoarthritis    ??? Osteoporosis        Past Surgical History:  Past Surgical History:   Procedure Laterality Date   ??? COLONOSCOPY N/A 09/18/2017    COLONOSCOPY/EGD performed by Blake Divine, MD at [facility]   ??? HX APPENDECTOMY     ??? HX BREAST BIOPSY Left     Stereotactic biopsy. Benign. Long time ago. Clip.   ??? HX CARPAL TUNNEL RELEASE Bilateral    ??? HX CATARACT REMOVAL Bilateral 2016   ??? HX COLONOSCOPY     ??? HX ENDOSCOPY     ??? HX HYSTERECTOMY      a long time ago   ??? 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 HX OPEN REDUCTION INTERNAL FIXATION Right 1993    no hardware, wrist   ??? HX OPEN REDUCTION INTERNAL FIXATION Left 2014    wrist, no hardware       Family History:  Family History   Problem Relation Age of Onset    ??? Breast Cancer Paternal Grandmother         onset: 28s   ??? Dementia Mother    ??? Headache Mother    ??? Heart Disease Father    ??? Parkinsonism Father    ??? Stroke Father        Social History:  Social History     Tobacco Use   ??? Smoking status: Former Smoker   ??? Smokeless tobacco: Never Used   Substance Use Topics   ??? Alcohol use: Yes     Comment: socially, mixed drink   ??? Drug use: No       Allergies:  Allergies   Allergen Reactions   ??? Ceftin [Cefuroxime Axetil] Anaphylaxis     Throat almost closed off   ??? Bactrim [Sulfamethoprim] Swelling     Throat swelling   ??? Codeine Swelling     throat   ??? Sulfa (Sulfonamide Antibiotics) Swelling     Swollen throat         Review of Systems   Constitutional: No  fever  Skin: No  rash  HEENT: No  nasal congestion  Resp: No cough  CV: No chest pain  GI: No vomiting  GU: No dysuria  MSK: No joint pain  Neuro: No numbness, + dizziness  Psych: No suicidal      Physical Exam     Patient Vitals for the past 12 hrs:   Temp Pulse Resp BP SpO2   01/07/19 1702 98.1 ??F (36.7 ??C) 100 18 (!) 173/92 98 %     General: alert, No acute distress  Eyes: EOMI, normal conjunctiva  ENT: moist mucous membranes.  Neck: Active, full ROM of neck.  Skin: No rashes.no jaundice              Lungs: Equal chest expansion.no respiratory distress.   Heart: regular rate     no peripheral edema     Abd:  non distended soft   Back: Full ROM  MSK: Full, active ROM in all 4 extremities.   Neuro:   II: vision grossly intact  III, IV, VI:  Extraocular motion intact, pupils reactive to light, bilateral lateral nystagmus  V: facial sensation intact  VII: face symmetric with normal eye closure and smile  VIII: hearing intact to finger rub bilaterally  IX, X: palate elevates symmetrically, phonation normal  XII: tongue midline with normal movements  Motor: normal bulk, tone, and strength bilaterally, no pronator drift  Sensory: normal sensation to light touch in bilateral upper and lower extremities   Coordination: intact to rapid alternating movements  Gait: steady gait with normal steps, and arm swing. Normal romberg.  Psych: Cooperative with exam; Appropriate mood and affect             Diagnostic Study Results     Labs -     Recent Results (from the past 12 hour(s))   EKG, 12 LEAD, INITIAL    Collection Time: 01/07/19  5:12 PM   Result Value Ref Range    Ventricular Rate 101 BPM    Atrial Rate 101 BPM    P-R Interval 154 ms    QRS Duration 72 ms    Q-T Interval 344 ms    QTC Calculation (Bezet) 446 ms    Calculated P Axis 70 degrees    Calculated R Axis 11 degrees    Calculated T Axis 91 degrees    Diagnosis       Sinus tachycardia  Right atrial enlargement  Abnormal QRS-T angle, consider primary T wave abnormality  When compared with ECG of 26-Apr-2018 12:13,  No significant change was found         Radiologic Studies -   MRI BRAIN WO CONT   Final Result   IMPRESSION: No significant change in right frontal lobe encephalomalacia with no   acute infarct, hemorrhage, or mass.      CT HEAD WO CONT   Final Result   IMPRESSION:       No acute intracranial abnormality on this noncontrast head CT.   Unchanged right frontal lobe chronic encephalomalacia.           CT Results  (Last 48 hours)               01/07/19 1748  CT HEAD WO CONT Final result    Impression:  IMPRESSION:        No acute intracranial abnormality on this noncontrast head CT.   Unchanged right frontal lobe chronic encephalomalacia.           Narrative:  EXAM: CT HEAD WO CONT       INDICATION: Dizziness, nausea, imbalance, and anorexia for one week.        COMPARISON: CT head on 08/05/2018 and MRI brain on 10/14/2016.       TECHNIQUE: Noncontrast head CT. Coronal and sagittal reformats.  CT dose   reduction was achieved through the use of a standardized protocol tailored for   this examination and automatic exposure control for dose modulation.       FINDINGS: The ventricles and sulci are age-appropriate without hydrocephalus.    There is no mass effect or midline shift. There is no intracranial hemorrhage or   extra-axial fluid collection. Chronic right frontal lobe encephalomalacia is   unchanged. No CT evidence of acute infarct. No artifact arises from aneurysm   clip in the right MCA territory.       The postsurgical calvarium is intact. The visualized paranasal sinuses and   mastoid air cells are clear.  CXR Results  (Last 48 hours)    None          Medical Decision Making   I am the first provider for this patient.    I reviewed the vital signs, available nursing notes, past medical history, past surgical history, family history and social history.    Provider Notes (Medical Decision Making):     Differential Diagnosis: Cerebral stroke, BPPV, labyrinthitis, SAH, migraine    Initial Plan: CT head negative, reassess.    ED Course:   Initial assessment performed. The patients presenting problems have been discussed, and they are in agreement with the care plan formulated and outlined with them.  I have encouraged them to ask questions as they arise throughout their visit.    ED Course as of Jan 07 2248   Thu Jan 07, 2019   1831 On my interpretation of the CT scan of the head no evidence of acute ischemic strokes.    [NW]   2031 On my interpretation the patient's MRI no evidence of stroke.    [NW]   2031 The patient presents today with difficulty with ambulation, dizziness, as a referral from her primary neurologist.  The patient has a negative CT scan, also has negative MRI.    [NW]      ED Course User Index  [NW] Robert Bellow, MD       I, Robert Bellow, MD, am the attending of record for this patient encounter.    Dispo: Discharged. The patient has been re-evaluated and is ready for discharge. Reviewed available results with patient. Counseled patient on diagnosis and care plan. Patient has expressed understanding, and all questions have been answered. Patient agrees with plan and agrees to  follow up as recommended, or to return to the ED if their symptoms worsen. Discharge instructions have been provided and explained to the patient, along with reasons to return to the ED.       PLAN:  Discharge Medication List as of 01/07/2019  8:30 PM      START taking these medications    Details   meclizine (ANTIVERT) 25 mg tablet Take 1 Tab by mouth three (3) times daily as needed for Dizziness for up to 10 days., Normal, Disp-30 Tab, R-0         CONTINUE these medications which have NOT CHANGED    Details   metFORMIN (GLUCOPHAGE) 500 mg tablet Take 500 mg by mouth two (2) times daily (with meals). Take one  tablet in the morning, and one in the evening, Historical Med      !! erenumab-aooe (AIMOVIG AUTOINJECTOR) 140 mg/mL injection 1 mL by SubCUTAneous route every thirty (30) days., Normal, Disp-3 Syringe, R-3      !! erenumab-aooe (AIMOVIG AUTOINJECTOR) 140 mg/mL injection 1 mL by SubCUTAneous route every thirty (30) days., Sample, Disp-1 Syringe, R-0      pantoprazole (PROTONIX) 40 mg tablet Take 40 mg by mouth daily. Take one  tablet in the morning, and one at night, Historical Med      atorvastatin (LIPITOR) 40 mg tablet Take 40 mg by mouth nightly., Historical Med      buPROPion XL (WELLBUTRIN XL) 300 mg XL tablet Take 300 mg by mouth every morning., Historical Med      DULoxetine (CYMBALTA) 20 mg capsule Take 20 mg by mouth nightly., Historical Med      nizatidine (AXID PO) Take 300 mg by mouth nightly., Historical Med      estradiol (  VAGIFEM) 10 mcg tab vaginal tablet Insert 10 mcg into vagina every seven (7) days., Historical Med      topiramate (TOPAMAX) 25 mg tablet Take 50 mg by mouth two (2) times a day. Taking 1 tab in morning and 2 tabs at night, Historical Med      denosumab (PROLIA) 60 mg/mL injection 60 mg by SubCUTAneous route every 6 months., Historical Med      ZOLMitriptan (ZOMIG ZMT) 5 mg disintegrating tablet Take 5 mg by mouth as needed for Migraine., Historical Med       fluticasone (FLOVENT HFA) 110 mcg/actuation inhaler Take 2 Puffs by inhalation every twelve (12) hours., Historical Med      albuterol (PROVENTIL HFA) 90 mcg/actuation inhaler Take 2 Puffs by inhalation as needed for Wheezing., Historical Med      methylPREDNISolone (MEDROL DOSEPACK) 4 mg tablet According to package instructions, Normal, Disp-1 Dose Pack, R-0       !! - Potential duplicate medications found. Please discuss with provider.      1.   2.     Follow-up Information     Follow up With Specialties Details Why Contact Info    Brager, Marveen Reeks, MD Otolaryngology Schedule an appointment as soon as possible for a visit in 2 days  [address]  [address]  [place] [address]  [phone]          3.   Return to ED if worse       Diagnosis     Clinical Impression:   1. Balance problem    2. Benign paroxysmal positional vertigo, unspecified laterality    3. Dizziness        Attestations:    Robert Bellow, MD    Please note that this dictation was completed with Dragon, the computer voice recognition software.  Quite often unanticipated grammatical, syntax, homophones, and other interpretive errors are inadvertently transcribed by the computer software.  Please disregard these errors.  Please excuse any errors that have escaped final proofreading.  Thank you.

## 2019-01-07 NOTE — Telephone Encounter (Signed)
I spoke to the patient over the phone.  Patient has been complaining of dizziness and imbalance.  It is to an extent where she cannot go to the bathroom without her husband's assistance.  Even if she is laying down there is a baseline dizziness and room spinning sensation which gets worse with movement.  This has been going on for the last 5 days and it is not getting better.    I have asked the patient to head over to the ER for an evaluation so that they can rule out any posterior circulation infarct.

## 2019-01-07 NOTE — Telephone Encounter (Signed)
DR varma please contact patient has called office twice

## 2019-01-07 NOTE — Telephone Encounter (Signed)
Pt states last FRi was dizzy patient balance is off and has been in bed symptoms came on quickly has been feeling dizzy for past week  DR Sallee Lange can you call patient

## 2019-01-07 NOTE — Progress Notes (Signed)
Nurse Clarification of the Prior to Admission Medication Regimen     The patient was interviewed regarding clarification of the prior to admission medication regimen.     The individual(s) listed above were questioned regarding the patient's use of any other inhalers, topical products, over the counter medications, herbal medications, vitamin products or ophthalmic/nasal/otic medication use.     Information Obtained From: Patient    Recommendations/Findings:   The following amendments were made to the patient's active medication list on file at [facility]:     1) Additions:   ? Metformin 500mg     2) Removals:   ? None    3) Changes:  ? (Old regimen: Protonix 40mg  tablet take one tablet once a day /New regimen: Protonix 40mg  tablet, take one tablet in the morning and one at night)         PTA medication list was corrected to the following:     Prior to Admission Medications   Prescriptions Last Dose Informant Taking?   DULoxetine (CYMBALTA) 20 mg capsule   Yes   Sig: Take 20 mg by mouth nightly.   ZOLMitriptan (ZOMIG ZMT) 5 mg disintegrating tablet   Yes   Sig: Take 5 mg by mouth as needed for Migraine.   albuterol (PROVENTIL HFA) 90 mcg/actuation inhaler   Yes   Sig: Take 2 Puffs by inhalation as needed for Wheezing.   atorvastatin (LIPITOR) 40 mg tablet   Yes   Sig: Take 40 mg by mouth nightly.   buPROPion XL (WELLBUTRIN XL) 300 mg XL tablet   Yes   Sig: Take 300 mg by mouth every morning.   denosumab (PROLIA) 60 mg/mL injection   Yes   Sig: 60 mg by SubCUTAneous route every 6 months.   erenumab-aooe (AIMOVIG AUTOINJECTOR) 140 mg/mL injection   Yes   Sig: 1 mL by SubCUTAneous route every thirty (30) days.   erenumab-aooe (AIMOVIG AUTOINJECTOR) 140 mg/mL injection   Yes   Sig: 1 mL by SubCUTAneous route every thirty (30) days.   estradiol (VAGIFEM) 10 mcg tab vaginal tablet   Yes   Sig: Insert 10 mcg into vagina every seven (7) days.   fluticasone (FLOVENT HFA) 110 mcg/actuation inhaler   Yes    Sig: Take 2 Puffs by inhalation every twelve (12) hours.   metFORMIN (GLUCOPHAGE) 500 mg tablet   Yes   Sig: Take 500 mg by mouth two (2) times daily (with meals). Take one 500mg  tablet in the morning, and one in the evening   methylPREDNISolone (MEDROL DOSEPACK) 4 mg tablet Not Taking at Unknown time  No   Sig: According to package instructions   nizatidine (AXID PO)   Yes   Sig: Take 300 mg by mouth nightly.   pantoprazole (PROTONIX) 40 mg tablet   Yes   Sig: Take 40 mg by mouth daily. Take one 40mg  tablet in the morning, and one at night   topiramate (TOPAMAX) 25 mg tablet   Yes   Sig: Take 50 mg by mouth two (2) times a day. Taking 1 tab in morning and 2 tabs at night      Facility-Administered Medications: None        Thank you,  Evern Core, RN

## 2019-01-07 NOTE — Telephone Encounter (Signed)
I called the patient but it was not answered.  Left voicemail that I am sending Medrol Dosepak.

## 2019-01-08 LAB — EKG 12-LEAD
Atrial Rate: 101 {beats}/min
P Axis: 70 degrees
P-R Interval: 154 ms
Q-T Interval: 344 ms
QRS Duration: 72 ms
QTc Calculation (Bazett): 446 ms
R Axis: 11 degrees
T Axis: 91 degrees
Ventricular Rate: 101 {beats}/min

## 2019-01-08 LAB — EKG, 12 LEAD, INITIAL
Atrial Rate: 101 {beats}/min
Calculated P Axis: 70 degrees
Calculated R Axis: 11 degrees
Calculated T Axis: 91 degrees
P-R Interval: 154 ms
Q-T Interval: 344 ms
QRS Duration: 72 ms
QTC Calculation (Bezet): 446 ms
Ventricular Rate: 101 {beats}/min

## 2019-01-08 MED ORDER — MECLIZINE 12.5 MG TAB
12.5 mg | ORAL | Status: AC
Start: 2019-01-08 — End: 2019-01-07
  Administered 2019-01-08: 01:00:00 via ORAL

## 2019-01-08 MED ORDER — MECLIZINE 25 MG TAB
25 mg | ORAL_TABLET | Freq: Three times a day (TID) | ORAL | 0 refills | Status: AC | PRN
Start: 2019-01-08 — End: 2019-01-17

## 2019-01-08 MED FILL — MECLIZINE 12.5 MG TAB: 12.5 mg | ORAL | Qty: 2

## 2019-01-08 NOTE — Progress Notes (Signed)
 Patient contacted regarding recent discharge and COVID-19 risk   Care Transition Nurse/ Ambulatory Care Manager contacted the patient by telephone to perform post discharge assessment. Verified name and DOB with patient as identifiers.     Patient has following risk factors of: asthma. CTN/ACM reviewed discharge instructions, medical action plan and red flags related to discharge diagnosis. Reviewed and educated them on any new and changed medications related to discharge diagnosis.  Advised obtaining a 90-day supply of all daily and as-needed medications.     Education provided regarding infection prevention, and signs and symptoms of COVID-19 and when to seek medical attention with patient who verbalized understanding. Discussed exposure protocols and quarantine from [facility] Guidelines "Are you at higher risk for severe illness 2019" and given an opportunity for questions and concerns. The patient agrees to contact the COVID-19 hotline [phone] or PCP office for questions related to their healthcare. CTN/ACM provided contact information for future reference.  Assisted patient to make PCP follow up appointment through [facility].    From CDC: Are you at higher risk for severe illness?    SABRA Hauser your hands often.  SABRA Avoid close contact (6 feet, which is about two arm lengths) with people who are sick.  . Put distance between yourself and other people if COVID-19 is spreading in your community.  . Clean and disinfect frequently touched surfaces.  . Avoid all cruise travel and non-essential air travel.  . Call your healthcare professional if you have concerns about COVID-19 and your underlying condition or if you are sick.    For more information on steps you can take to protect yourself, see CDC's How to Protect Yourself      Patient/family/caregiver given information for GetWell Loop and agrees to enroll yes  Patient's preferred e-mail:  [email or web address]  Patient's preferred phone number: [phone]  Based on  Loop alert triggers, patient will be contacted by nurse care manager for worsening symptoms.    Pt will be further monitored by COVID Loop Team based on severity of symptoms and risk factors. DMB

## 2019-01-08 NOTE — Progress Notes (Signed)
Patient contacted regarding recent discharge and COVID-19 risk   Care Transition Nurse/ Ambulatory Care Manager contacted the patient by telephone to perform post discharge assessment. Verified name and DOB with patient as identifiers.     Patient has following risk factors of: asthma. CTN/ACM reviewed discharge instructions, medical action plan and red flags related to discharge diagnosis. Reviewed and educated them on any new and changed medications related to discharge diagnosis.  Advised obtaining a 90-day supply of all daily and as-needed medications.     Education provided regarding infection prevention, and signs and symptoms of COVID-19 and when to seek medical attention with patient who verbalized understanding. Discussed exposure protocols and quarantine from [facility] Guidelines ???Are you at higher risk for severe illness 2019??? and given an opportunity for questions and concerns. The patient agrees to contact the COVID-19 hotline [phone] or PCP office for questions related to their healthcare. CTN/ACM provided contact information for future reference.  Assisted patient to make PCP follow up appointment through [facility].    From CDC: Are you at higher risk for severe illness?    ??? Wash your hands often.  ??? Avoid close contact (6 feet, which is about two arm lengths) with people who are sick.  ??? Put distance between yourself and other people if COVID-19 is spreading in your community.  ??? Clean and disinfect frequently touched surfaces.  ??? Avoid all cruise travel and non-essential air travel.  ??? Call your healthcare professional if you have concerns about COVID-19 and your underlying condition or if you are sick.    For more information on steps you can take to protect yourself, see CDC's How to Protect Yourself      Patient/family/caregiver given information for GetWell Loop and agrees to enroll yes  Patient's preferred e-mail:  [email or web address]  Patient's preferred phone number: [phone]   Based on Loop alert triggers, patient will be contacted by nurse care manager for worsening symptoms.    Pt will be further monitored by COVID Loop Team?? based on severity of symptoms and risk factors. DMB

## 2019-01-14 NOTE — Telephone Encounter (Addendum)
LVM to inform patient of cancellation of home study being shipped on 01/22/2019 from our office due to consolidation of locations and reduced staffing. Informed patient she will receive a call from one of our technologists or a company we are partnering with ([employer]) that will ship the device within the next two weeks.

## 2019-01-19 ENCOUNTER — Telehealth: Attending: Internal Medicine | Primary: Family Medicine

## 2019-01-19 ENCOUNTER — Telehealth: Admit: 2019-01-19 | Payer: PRIVATE HEALTH INSURANCE | Attending: Internal Medicine | Primary: Family Medicine

## 2019-01-19 DIAGNOSIS — Z7689 Persons encountering health services in other specified circumstances: Secondary | ICD-10-CM

## 2019-01-19 DIAGNOSIS — E119 Type 2 diabetes mellitus without complications: Secondary | ICD-10-CM | POA: Insufficient documentation

## 2019-01-19 NOTE — Progress Notes (Signed)
Chief Complaint   Patient presents with   ??? New Patient     HPI:  Kayla Shaffer is a 68 y.o. female who was seen by synchronous (real-time) audio-video technology on 01/19/2019.      Consent: Kayla KendallLucinda Kulak, who was seen by synchronous (real-time) audio-video technology, and/or her healthcare decision maker, is aware that this patient-initiated, Telehealth encounter on 01/19/2019 is a billable service, with coverage as determined by her insurance carrier. She is aware that she may receive a bill and has provided verbal consent to proceed: Yes.     Assessment & Plan:   Diagnoses and all orders for this visit:    1. Establishing care with new doctor, encounter for    2. Controlled type 2 diabetes mellitus with diabetic nephropathy, without long-term current use of insulin (HCC)  -     MICROALBUMIN, UR, RAND W/ MICROALB/CREAT RATIO    3. Anxiety and depression    4. Chronic migraine without aura without status migrainosus, not intractable    5. Osteoporosis, post-menopausal    6. Gastroesophageal reflux disease without esophagitis    7. Chronic bilateral back pain, unspecified back location    8. Vitamin D deficiency  -     VITAMIN D, 25 HYDROXY    9. Frequency of urination  -     URINALYSIS W/ RFLX MICROSCOPIC    10. Dyslipidemia  -     LIPID PANEL          I spent at least 45 minutes on this visit with this new patient. [phone]  [phone]  Subjective:   Kayla KendallLucinda Bonaventure is a 68 y.o. caucasian female who was seen as a New Patient  Migraine Headaches, follows Dr. Verlin GrillsVirma as neurologist. She takes Topamax,  And Zolmitriptan  Depression and anxiety: She take Wellbutrin, symptoms are stable.  Diabetes type 2, last A1C=7% on metformin.  Osteoporosis: treated with Prolia.  Chronic back pain due to arthritis, she takes Cymbalta  GERD: She protonix, stble    Prior to Admission medications    Medication Sig Start Date End Date Taking? Authorizing Provider   metFORMIN (GLUCOPHAGE) 500 mg tablet Take 500 mg by mouth two (2) times daily  (with meals). Take one 500mg  tablet in the morning, and one in the evening   Yes Other, Phys, MD   erenumab-aooe (AIMOVIG AUTOINJECTOR) 140 mg/mL injection 1 mL by SubCUTAneous route every thirty (30) days. 10/12/18  Yes Doroteo BradfordVarma, Siddhartha, MD   pantoprazole (PROTONIX) 40 mg tablet Take 40 mg by mouth daily. Take one 40mg  tablet in the morning, and one at night   Yes Other, Phys, MD   buPROPion XL (WELLBUTRIN XL) 300 mg XL tablet Take 300 mg by mouth every morning.   Yes Provider, Historical   DULoxetine (CYMBALTA) 20 mg capsule Take 20 mg by mouth nightly.   Yes Provider, Historical   nizatidine (AXID PO) Take 300 mg by mouth nightly.   Yes Provider, Historical   estradiol (VAGIFEM) 10 mcg tab vaginal tablet Insert 10 mcg into vagina every seven (7) days.   Yes Provider, Historical   topiramate (TOPAMAX) 25 mg tablet Take 50 mg by mouth two (2) times a day. Taking 1 tab in morning and 2 tabs at night   Yes Provider, Historical   denosumab (PROLIA) 60 mg/mL injection 60 mg by SubCUTAneous route every 6 months.   Yes Provider, Historical   ZOLMitriptan (ZOMIG ZMT) 5 mg disintegrating tablet Take 5 mg by mouth as needed for Migraine.  Yes Provider, Historical   fluticasone (FLOVENT HFA) 110 mcg/actuation inhaler Take 2 Puffs by inhalation every twelve (12) hours.   Yes Provider, Historical   albuterol (PROVENTIL HFA) 90 mcg/actuation inhaler Take 2 Puffs by inhalation as needed for Wheezing.   Yes Provider, Historical   methylPREDNISolone (MEDROL DOSEPACK) 4 mg tablet According to package instructions 01/07/19 01/19/19  Doroteo Bradford, MD   erenumab-aooe (AIMOVIG AUTOINJECTOR) 140 mg/mL injection 1 mL by SubCUTAneous route every thirty (30) days. 10/26/18 01/19/19  Doroteo Bradford, MD   atorvastatin (LIPITOR) 40 mg tablet Take 40 mg by mouth nightly.    Provider, Historical     Allergies   Allergen Reactions   ??? Ceftin [Cefuroxime Axetil] Anaphylaxis     Throat almost closed off   ??? Bactrim [Sulfamethoprim] Swelling      Throat swelling   ??? Codeine Swelling     throat   ??? Sulfa (Sulfonamide Antibiotics) Swelling     Swollen throat     Patient Active Problem List   Diagnosis Code   ??? 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 Arthritis M19.90   ??? Asthma J45.909   ??? Barretts syndrome K22.70   ??? Cervicalgia M54.2   ??? 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 Difficulty walking R26.2   ??? Hyperlipidemia E78.5   ??? Migraine without aura or status migrainosus G43.009   ??? Muscle weakness M62.81   ??? Osteoporosis M81.0   ??? Type II diabetes mellitus (HCC) E11.9   ??? Wrist fracture S62.109A     Current Outpatient Medications   Medication Sig Dispense Refill   ??? metFORMIN (GLUCOPHAGE) 500 mg tablet Take 500 mg by mouth two (2) times daily (with meals). Take one  tablet in the morning, and one in the evening     ??? erenumab-aooe (AIMOVIG AUTOINJECTOR) 140 mg/mL injection 1 mL by SubCUTAneous route every thirty (30) days. 1 Syringe 0   ??? pantoprazole (PROTONIX) 40 mg tablet Take 40 mg by mouth daily. Take one  tablet in the morning, and one at night     ??? buPROPion XL (WELLBUTRIN XL) 300 mg XL tablet Take 300 mg by mouth every morning.     ??? DULoxetine (CYMBALTA) 20 mg capsule Take 20 mg by mouth nightly.     ??? nizatidine (AXID PO) Take 300 mg by mouth nightly.     ??? estradiol (VAGIFEM) 10 mcg tab vaginal tablet Insert 10 mcg into vagina every seven (7) days.     ??? topiramate (TOPAMAX) 25 mg tablet Take 50 mg by mouth two (2) times a day. Taking 1 tab in morning and 2 tabs at night     ??? denosumab (PROLIA) 60 mg/mL injection 60 mg by SubCUTAneous route every 6 months.     ??? ZOLMitriptan (ZOMIG ZMT) 5 mg disintegrating tablet Take 5 mg by mouth as needed for Migraine.     ??? fluticasone (FLOVENT HFA) 110 mcg/actuation inhaler Take 2 Puffs by inhalation every twelve (12) hours.     ??? albuterol (PROVENTIL HFA) 90 mcg/actuation inhaler Take 2 Puffs by inhalation as needed for Wheezing.     ??? atorvastatin (LIPITOR) 40  mg tablet Take 40 mg by mouth nightly.       Allergies   Allergen Reactions   ??? Ceftin [Cefuroxime Axetil] Anaphylaxis     Throat almost closed off   ??? Bactrim [Sulfamethoprim] Swelling     Throat swelling   ??? Codeine Swelling  throat   ??? Sulfa (Sulfonamide Antibiotics) Swelling     Swollen throat     Past Medical History:   Diagnosis Date   ??? Asthma     last flare up over a year ago as stated 09/02/2017   ??? Barrett esophagus    ??? Diabetes (HCC)     no medications, just watching   ??? Hiatal hernia     sliding   ??? Migraine    ??? Osteoarthritis    ??? Osteoporosis      Past Surgical History:   Procedure Laterality Date   ??? COLONOSCOPY N/A 09/18/2017    COLONOSCOPY/EGD performed by Blake Divine, MD at [facility]   ??? HX APPENDECTOMY     ??? HX BREAST BIOPSY Left     Stereotactic biopsy. Benign. Long time ago. Clip.   ??? HX CARPAL TUNNEL RELEASE Bilateral    ??? HX CATARACT REMOVAL Bilateral 2016   ??? HX COLONOSCOPY     ??? HX ENDOSCOPY     ??? HX HYSTERECTOMY      a long time ago   ??? HX INTRACRANIAL ANEURYSM REPAIR Right 10/2015    behind right eye, clip, done at [facility], safe for MRI   ??? HX OPEN REDUCTION INTERNAL FIXATION Right 1993    no hardware, wrist   ??? HX OPEN REDUCTION INTERNAL FIXATION Left 2014    wrist, no hardware     Family History   Problem Relation Age of Onset   ??? Breast Cancer Paternal Grandmother         onset: 2s   ??? Dementia Mother    ??? Headache Mother    ??? Heart Disease Father    ??? Parkinsonism Father    ??? Stroke Father      Social History     Tobacco Use   ??? Smoking status: Former Smoker   ??? Smokeless tobacco: Never Used   Substance Use Topics   ??? Alcohol use: Not Currently     Comment: socially, mixed drink       ROS      Objective:     Visit Vitals  BP 126/86   Pulse 88   Temp 97.3 ??F (36.3 ??C)   Ht 5\' 2"  (1.575 m)   BMI 32.92 kg/m??      General: alert, cooperative, no distress   Mental  status: normal mood, behavior, speech, dress, motor activity, and thought processes, able to follow  commands   HENT: NCAT   Neck: no visualized mass   Resp: no respiratory distress   Neuro: no gross deficits   Skin: no discoloration or lesions of concern on visible areas   Psychiatric: normal affect, consistent with stated mood, no evidence of hallucinations     Additional exam findings:       We discussed the expected course, resolution and complications of the diagnosis(es) in detail.  Medication risks, benefits, costs, interactions, and alternatives were discussed as indicated.  I advised her to contact the office if her condition worsens, changes or fails to improve as anticipated. She expressed understanding with the diagnosis(es) and plan.     Mariali Guile is a 68 y.o. female who was evaluated by a video visit encounter for concerns as above. Patient identification was verified prior to start of the visit. A caregiver was present when appropriate. Due to this being a Scientist, research (medical) (During COVID-19 public health emergency), evaluation of the following organ systems was limited: Vitals/Constitutional/EENT/Resp/CV/GI/GU/MS/Neuro/Skin/Heme-Lymph-Imm.  Pursuant  to the emergency declaration under the [facility] Act and the [employer], 1135 waiver authority and the [employer] and [employer] Act, this Virtual  Visit was conducted, with patient's (and/or legal guardian's) consent, to reduce the patient's risk of exposure to COVID-19 and provide necessary medical care.     Services were provided through a video synchronous discussion virtually to substitute for in-person clinic visit.   Patient and provider were located at their individual homes.      Erenest Rasher, MD

## 2019-01-19 NOTE — Progress Notes (Signed)
Chief Complaint   Patient presents with   . New Patient     Patient was last seen at the [facility]. Her Blood Sugar Reading this AM  172. Had a colonoscopy in 09/2017. Recent lab results in MyChart

## 2019-01-19 NOTE — Progress Notes (Signed)
Chief Complaint   Patient presents with   ??? New Patient     Patient was last seen at the [facility]. Her Blood Sugar Reading this AM  172. Had a colonoscopy in 09/2017. Recent lab results in MyChart

## 2019-01-19 NOTE — Progress Notes (Signed)
Chief Complaint   Patient presents with   ??? New Patient     HPI:  Kayla Shaffer is a 68 y.o. female who was seen by synchronous (real-time) audio-video technology on 01/19/2019.      Consent: Kayla Shaffer, who was seen by synchronous (real-time) audio-video technology, and/or her healthcare decision maker, is aware that this patient-initiated, Telehealth encounter on 01/19/2019 is a billable service, with coverage as determined by her insurance carrier. She is aware that she may receive a bill and has provided verbal consent to proceed: Yes.     Assessment & Plan:   Diagnoses and all orders for this visit:    1. Establishing care with new doctor, encounter for    2. Controlled type 2 diabetes mellitus with diabetic nephropathy, without long-term current use of insulin (HCC)  -     MICROALBUMIN, UR, RAND W/ MICROALB/CREAT RATIO    3. Anxiety and depression    4. Chronic migraine without aura without status migrainosus, not intractable    5. Osteoporosis, post-menopausal    6. Gastroesophageal reflux disease without esophagitis    7. Chronic bilateral back pain, unspecified back location    8. Vitamin D deficiency  -     VITAMIN D, 25 HYDROXY    9. Frequency of urination  -     URINALYSIS W/ RFLX MICROSCOPIC    10. Dyslipidemia  -     LIPID PANEL          I spent at least 45 minutes on this visit with this new patient. [phone])  712  Subjective:   Kayla Shaffer is a 68 y.o. caucasian female who was seen as a New Patient  Migraine Headaches, follows Dr. Verlin Grills as neurologist. She takes Topamax,  And Zolmitriptan  Depression and anxiety: She take Wellbutrin, symptoms are stable.  Diabetes type 2, last A1C=7% on metformin.  Osteoporosis: treated with Prolia.  Chronic back pain due to arthritis, she takes Cymbalta  GERD: She protonix, stble    Prior to Admission medications    Medication Sig Start Date End Date Taking? Authorizing Provider   metFORMIN (GLUCOPHAGE) 500 mg tablet Take 500 mg by mouth two (2) times  daily (with meals). Take one 500mg  tablet in the morning, and one in the evening   Yes Other, Phys, MD   erenumab-aooe (AIMOVIG AUTOINJECTOR) 140 mg/mL injection 1 mL by SubCUTAneous route every thirty (30) days. 10/12/18  Yes Doroteo Bradford, MD   pantoprazole (PROTONIX) 40 mg tablet Take 40 mg by mouth daily. Take one 40mg  tablet in the morning, and one at night   Yes Other, Phys, MD   buPROPion XL (WELLBUTRIN XL) 300 mg XL tablet Take 300 mg by mouth every morning.   Yes Provider, Historical   DULoxetine (CYMBALTA) 20 mg capsule Take 20 mg by mouth nightly.   Yes Provider, Historical   nizatidine (AXID PO) Take 300 mg by mouth nightly.   Yes Provider, Historical   estradiol (VAGIFEM) 10 mcg tab vaginal tablet Insert 10 mcg into vagina every seven (7) days.   Yes Provider, Historical   topiramate (TOPAMAX) 25 mg tablet Take 50 mg by mouth two (2) times a day. Taking 1 tab in morning and 2 tabs at night   Yes Provider, Historical   denosumab (PROLIA) 60 mg/mL injection 60 mg by SubCUTAneous route every 6 months.   Yes Provider, Historical   ZOLMitriptan (ZOMIG ZMT) 5 mg disintegrating tablet Take 5 mg by mouth as needed for Migraine.  Yes Provider, Historical   fluticasone (FLOVENT HFA) 110 mcg/actuation inhaler Take 2 Puffs by inhalation every twelve (12) hours.   Yes Provider, Historical   albuterol (PROVENTIL HFA) 90 mcg/actuation inhaler Take 2 Puffs by inhalation as needed for Wheezing.   Yes Provider, Historical   methylPREDNISolone (MEDROL DOSEPACK) 4 mg tablet According to package instructions 01/07/19 01/19/19  Doroteo Bradford, MD   erenumab-aooe (AIMOVIG AUTOINJECTOR) 140 mg/mL injection 1 mL by SubCUTAneous route every thirty (30) days. 10/26/18 01/19/19  Doroteo Bradford, MD   atorvastatin (LIPITOR) 40 mg tablet Take 40 mg by mouth nightly.    Provider, Historical     Allergies   Allergen Reactions   ??? Ceftin [Cefuroxime Axetil] Anaphylaxis     Throat almost closed off    ??? Bactrim [Sulfamethoprim] Swelling     Throat swelling   ??? Codeine Swelling     throat   ??? Sulfa (Sulfonamide Antibiotics) Swelling     Swollen throat     Patient Active Problem List   Diagnosis Code   ??? 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 Arthritis M19.90   ??? Asthma J45.909   ??? Barretts syndrome K22.70   ??? Cervicalgia M54.2   ??? 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 Difficulty walking R26.2   ??? Hyperlipidemia E78.5   ??? Migraine without aura or status migrainosus G43.009   ??? Muscle weakness M62.81   ??? Osteoporosis M81.0   ??? Type II diabetes mellitus (HCC) E11.9   ??? Wrist fracture S62.109A     Current Outpatient Medications   Medication Sig Dispense Refill   ??? metFORMIN (GLUCOPHAGE) 500 mg tablet Take 500 mg by mouth two (2) times daily (with meals). Take one  tablet in the morning, and one in the evening     ??? erenumab-aooe (AIMOVIG AUTOINJECTOR) 140 mg/mL injection 1 mL by SubCUTAneous route every thirty (30) days. 1 Syringe 0   ??? pantoprazole (PROTONIX) 40 mg tablet Take 40 mg by mouth daily. Take one  tablet in the morning, and one at night     ??? buPROPion XL (WELLBUTRIN XL) 300 mg XL tablet Take 300 mg by mouth every morning.     ??? DULoxetine (CYMBALTA) 20 mg capsule Take 20 mg by mouth nightly.     ??? nizatidine (AXID PO) Take 300 mg by mouth nightly.     ??? estradiol (VAGIFEM) 10 mcg tab vaginal tablet Insert 10 mcg into vagina every seven (7) days.     ??? topiramate (TOPAMAX) 25 mg tablet Take 50 mg by mouth two (2) times a day. Taking 1 tab in morning and 2 tabs at night     ??? denosumab (PROLIA) 60 mg/mL injection 60 mg by SubCUTAneous route every 6 months.     ??? ZOLMitriptan (ZOMIG ZMT) 5 mg disintegrating tablet Take 5 mg by mouth as needed for Migraine.     ??? fluticasone (FLOVENT HFA) 110 mcg/actuation inhaler Take 2 Puffs by inhalation every twelve (12) hours.     ??? albuterol (PROVENTIL HFA) 90 mcg/actuation inhaler Take 2 Puffs by  inhalation as needed for Wheezing.     ??? atorvastatin (LIPITOR) 40 mg tablet Take 40 mg by mouth nightly.       Allergies   Allergen Reactions   ??? Ceftin [Cefuroxime Axetil] Anaphylaxis     Throat almost closed off   ??? Bactrim [Sulfamethoprim] Swelling     Throat swelling   ??? Codeine Swelling  throat   ??? Sulfa (Sulfonamide Antibiotics) Swelling     Swollen throat     Past Medical History:   Diagnosis Date   ??? Asthma     last flare up over a year ago as stated 09/02/2017   ??? Barrett esophagus    ??? Diabetes (HCC)     no medications, just watching   ??? Hiatal hernia     sliding   ??? Migraine    ??? Osteoarthritis    ??? Osteoporosis      Past Surgical History:   Procedure Laterality Date   ??? COLONOSCOPY N/A 09/18/2017    COLONOSCOPY/EGD performed by Blake DivineManetas, Michael S, MD at [facility]   ??? HX APPENDECTOMY     ??? HX BREAST BIOPSY Left     Stereotactic biopsy. Benign. Long time ago. Clip.   ??? HX CARPAL TUNNEL RELEASE Bilateral    ??? HX CATARACT REMOVAL Bilateral 2016   ??? HX COLONOSCOPY     ??? HX ENDOSCOPY     ??? HX HYSTERECTOMY      a long time ago   ??? HX INTRACRANIAL ANEURYSM REPAIR Right 10/2015    [facility]   ??? HX OPEN REDUCTION INTERNAL FIXATION Right 1993    no hardware, wrist   ??? HX OPEN REDUCTION INTERNAL FIXATION Left 2014    wrist, no hardware     Family History   Problem Relation Age of Onset   ??? Breast Cancer Paternal Grandmother         onset: 4570s   ??? Dementia Mother    ??? Headache Mother    ??? Heart Disease Father    ??? Parkinsonism Father    ??? Stroke Father      Social History     Tobacco Use   ??? Smoking status: Former Smoker   ??? Smokeless tobacco: Never Used   Substance Use Topics   ??? Alcohol use: Not Currently     Comment: socially, mixed drink       ROS      Objective:     Visit Vitals  BP 126/86   Pulse 88   Temp 97.3 ??F (36.3 ??C)   Ht 5\' 2"  (1.575 m)   BMI 32.92 kg/m??      General: alert, cooperative, no distress    Mental  status: normal mood, behavior, speech, dress, motor activity, and thought processes, able to follow commands   HENT: NCAT   Neck: no visualized mass   Resp: no respiratory distress   Neuro: no gross deficits   Skin: no discoloration or lesions of concern on visible areas   Psychiatric: normal affect, consistent with stated mood, no evidence of hallucinations     Additional exam findings:       We discussed the expected course, resolution and complications of the diagnosis(es) in detail.  Medication risks, benefits, costs, interactions, and alternatives were discussed as indicated.  I advised her to contact the office if her condition worsens, changes or fails to improve as anticipated. She expressed understanding with the diagnosis(es) and plan.     Kayla Shaffer is a 68 y.o. female who was evaluated by a video visit encounter for concerns as above. Patient identification was verified prior to start of the visit. A caregiver was present when appropriate. Due to this being a Scientist, research (medical)TeleHealth encounter (During COVID-19 public health emergency), evaluation of the following organ systems was limited: Vitals/Constitutional/EENT/Resp/CV/GI/GU/MS/Neuro/Skin/Heme-Lymph-Imm.  Pursuant  to the emergency declaration under the [facility] Act and the [employer], 1135 waiver authority and the [employer] Act, this Virtual  Visit was conducted, with patient's (and/or legal guardian's) consent, to reduce the patient's risk of exposure to COVID-19 and provide necessary medical care.     Services were provided through a video synchronous discussion virtually to substitute for in-person clinic visit.   Patient and provider were located at their individual homes.      Erenest Rasher, MD

## 2019-01-22 ENCOUNTER — Encounter: Primary: Family Medicine

## 2019-01-27 NOTE — Telephone Encounter (Signed)
HSAT shipped to patient 01-27-2019

## 2019-01-29 ENCOUNTER — Inpatient Hospital Stay: Admit: 2019-02-03 | Payer: PRIVATE HEALTH INSURANCE | Primary: Family Medicine

## 2019-01-29 DIAGNOSIS — G4733 Obstructive sleep apnea (adult) (pediatric): Secondary | ICD-10-CM

## 2019-02-12 ENCOUNTER — Telehealth

## 2019-02-12 NOTE — Telephone Encounter (Signed)
Results in r-drive  Results conveyed to patient via Mychart  Order PAP and call patient and let them know which DME company they should be hearing from.  Schedule for first adherence visit in 6 weeks.

## 2019-02-15 NOTE — Progress Notes (Signed)
Faxed order for PAP setup.

## 2019-02-17 ENCOUNTER — Ambulatory Visit: Attending: Neurology | Primary: Family Medicine

## 2019-02-17 ENCOUNTER — Ambulatory Visit: Admit: 2019-02-17 | Payer: PRIVATE HEALTH INSURANCE | Attending: Neurology | Primary: Family Medicine

## 2019-02-17 DIAGNOSIS — G609 Hereditary and idiopathic neuropathy, unspecified: Secondary | ICD-10-CM

## 2019-02-17 NOTE — Progress Notes (Signed)
Neurology Note    Chief Complaint   Patient presents with   ??? Follow-up     patient had very bad vertigo, PT at ENT 4 times   ??? Headache   ??? Dizziness       HPI/Subjective  Kayla Shaffer is a 68 y.o. female who presented to the neurology office for management of migraines.     She has had migraines for a long time. It started around the age of 68. She does have headaches on the left side of the head. It is sharp, throbbing and aching in character. It is around 10/10 in severity. There is associated photophobia, phonophobia, nausea.  If she takes zomig, it lasts for around 12 hrs. she does have significant drowsiness on Topamax.  She is presently taking 50 mg in the morning and 100 mg at night.    [place] [facility] [place] [place]. 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     The patient is also complaining of poor balance and numbness in the legs.    Interval history:  Headache is better  Dizziness is better as well    Baseline headache frequency: 12/month  Headache frequency now:  6-9/MONTH    Risk Factors for headaches  Smoking: denies  Coffee: 1 cups/day  Tea: 3-4 cups/day  Soda: 2/week  Water: 8-10 glasses/day  Sleeps at 9 pm in her couch and wakes up at 11 pm and then she goes to her bed and she does wake up at 9 am. She does go to bed again around 1 pm and sleeps for 2-3 hrs.. She does snore.    Medications tried  Preventative therapy:  Topamax  Cymbalta  Depakote  Botox    Abortive therapy:  Zomig     Current Outpatient Medications   Medication Sig   ??? metFORMIN (GLUCOPHAGE) 500 mg tablet Take 500 mg by mouth two (2) times daily (with meals). Take one 500mg  tablet in the morning, and one in the evening   ??? erenumab-aooe (AIMOVIG AUTOINJECTOR) 140 mg/mL injection 1 mL by SubCUTAneous route every thirty (30) days.   ??? pantoprazole (PROTONIX) 40 mg tablet Take 40 mg by mouth daily. Take one 40mg  tablet in the morning, and one at night   ??? buPROPion XL (WELLBUTRIN  XL) 300 mg XL tablet Take 300 mg by mouth every morning.   ??? DULoxetine (CYMBALTA) 20 mg capsule Take 20 mg by mouth nightly.   ??? nizatidine (AXID PO) Take 300 mg by mouth nightly.   ??? estradiol (VAGIFEM) 10 mcg tab vaginal tablet Insert 10 mcg into vagina every seven (7) days.   ??? topiramate (TOPAMAX) 25 mg tablet Take 25 mg by mouth two (2) times a day. Taking 1 tab in morning and 2 tabs at night   ??? denosumab (PROLIA) 60 mg/mL injection 60 mg by SubCUTAneous route every 6 months.   ??? ZOLMitriptan (ZOMIG ZMT) 5 mg disintegrating tablet Take 5 mg by mouth as needed for Migraine.   ??? fluticasone (FLOVENT HFA) 110 mcg/actuation inhaler Take 2 Puffs by inhalation every twelve (12) hours.   ??? albuterol (PROVENTIL HFA) 90 mcg/actuation inhaler Take 2 Puffs by inhalation as needed for Wheezing.   ??? atorvastatin (LIPITOR) 40 mg tablet Take 40 mg by mouth nightly.     No current facility-administered medications for this visit.      Allergies   Allergen Reactions   ??? Ceftin [  Cefuroxime Axetil] Anaphylaxis     Throat almost closed off   ??? Bactrim [Sulfamethoprim] Swelling     Throat swelling   ??? Codeine Swelling     throat   ??? Sulfa (Sulfonamide Antibiotics) Swelling     Swollen throat     Past Medical History:   Diagnosis Date   ??? Asthma     last flare up over a year ago as stated 09/02/2017   ??? Barrett esophagus    ??? Diabetes (HCC)     no medications, just watching   ??? Hiatal hernia     sliding   ??? Migraine    ??? Osteoarthritis    ??? Osteoporosis      Past Surgical History:   Procedure Laterality Date   ??? COLONOSCOPY N/A 09/18/2017    COLONOSCOPY/EGD performed by Blake Divine, [place] at [facility]   ??? HX APPENDECTOMY     ??? HX BREAST BIOPSY Left     Stereotactic biopsy. Benign. Long time ago. Clip.   ??? HX CARPAL TUNNEL RELEASE Bilateral    ??? HX CATARACT REMOVAL Bilateral 2016   ??? HX COLONOSCOPY     ??? HX ENDOSCOPY     ??? HX HYSTERECTOMY      a long time ago   ??? HX INTRACRANIAL ANEURYSM REPAIR Right 10/2015    behind  right eye, clip, done at [facility], safe for MRI   ??? HX OPEN REDUCTION INTERNAL FIXATION Right 1993    no hardware, wrist   ??? HX OPEN REDUCTION INTERNAL FIXATION Left 2014    wrist, no hardware     Family History   Problem Relation Age of Onset   ??? Breast Cancer Paternal Grandmother         onset: 12s   ??? Dementia Mother    ??? Headache Mother    ??? Heart Disease Father    ??? Parkinsonism Father    ??? Stroke Father      Social History     Tobacco Use   ??? Smoking status: Former Smoker   ??? Smokeless tobacco: Never Used   Substance Use Topics   ??? Alcohol use: Not Currently     Comment: socially, mixed drink   ??? Drug use: No       REVIEW OF SYSTEMS:   A ten system review of constitutional, cardiovascular, respiratory, musculoskeletal, endocrine, skin, SHEENT, genitourinary, psychiatric and neurologic systems was obtained and is unremarkable with the exception of anxiety, chest pain, constipation, cough, depression, diarrhea, falls, fatigue, frequent headache, joint pain, memory loss, muscle pain, muscle weakness, nausea, rash, ringing in the ear, snoring, stomach pain, vertigo and visual disturbance    EXAMINATION:   Visit Vitals  BP (!) 142/95   Pulse 97   Ht 5\' 2"  (1.575 m)   Wt 191 lb (86.6 kg)   SpO2 100%   BMI 34.93 kg/m??        General:   General appearance: Pt is in no acute distress   Distal pulses are preserved  Fundoscopic Exam: Normal    Neurological Examination:   Mental Status: AAO x3. Speech is fluent. Follows commands, has normal fund of knowledge, attention, short term recall, comprehension and insight.     Cranial Nerves: Visual fields are full. PERRL, Extraocular movements are full. Facial sensation intact. Facial movement intact. Hearing intact to conversation. Palate elevates symmetrically. Shoulder shrug symmetric. Tongue midline.     Motor: Strength is 5/5 in all 4 ext. No  atrophy.     Tone: Normal    Sensation: Light touch - Normal    Reflexes: DTRs 2+ throughout.      Coordination/Cerebellar:  Intact to finger-nose-finger     Gait: Casual gait is normal.     Skin: No significant bruising or lacerations.    Laboratory review:   Results for orders placed or performed during the hospital encounter of 01/07/19   EKG, 12 LEAD, INITIAL   Result Value Ref Range    Ventricular Rate 101 BPM    Atrial Rate 101 BPM    P-R Interval 154 ms    QRS Duration 72 ms    Q-T Interval 344 ms    QTC Calculation (Bezet) 446 ms    Calculated P Axis 70 degrees    Calculated R Axis 11 degrees    Calculated T Axis 91 degrees    Diagnosis       Sinus tachycardia  Right atrial enlargement  Abnormal QRS-T angle, consider primary T wave abnormality  When compared with ECG of 26-Apr-2018 12:13,  No significant change was found  Confirmed by Ravindra, P.V. ([id number]) on 01/08/2019 4:42:44 PM         Imaging review:  01/29/2019  Sleep study  Positive home sleep test for sleep apnea.  Recommend positive airway pressure trial    10/12/2018  EMG/nerve conduction study  Normal    Documentation review:  None    Assessment/Plan:   1. Idiopathic peripheral neuropathy  The patient does have poor balance and on examination has decreased pinprick sensation distally in the lower extremities.  To suspect peripheral neuropathy.  She states that her blood sugars stay around 200.  EMG/NCS was normal. Blood work showed DM. Mild improvement with vestibular rehab.     2. Intractable chronic migraine without aura and without status migrainosus  The patient does have chronic intractable migraines and has tried Botox, Depakote, Cymbalta and Topamax.      - She is on topamax 25 mg in am and 50 mg po qhs. She is having memory problems. Will discontinue it over 1 wk.   - She is on aimovig and will continue the same.    3. Hx of cerebral aneurysm repair  She did have aneurysm clipping in February 2017.  Doing well.    4. OSA  She was recently diagnosed with OSA and is going for CPAP titration    Follow-up in 4 months.    No flowsheet data found.  Primary care to  address possible depression if PHQ-9 score is more than 9.      ICD-10-CM ICD-9-CM    1. Idiopathic peripheral neuropathy G60.9 356.9    2. Intractable chronic migraine without aura and without status migrainosus G43.719 346.71    3. Hx of cerebral aneurysm repair Z98.890 V45.89     Z86.79     4. Snoring R06.83 786.09       Thank you for allowing me to participate in the care of Kayla Shaffer. Please feel free to contact me if you have any questions.    Electronically signed.   Doroteo Bradford, MD  Neurologist    CC: Erenest Rasher, MD  Fax: [phone]    This note was created using voice recognition software. Despite editing, there may be syntax errors.

## 2019-02-17 NOTE — Progress Notes (Signed)
Neurology Note    Chief Complaint   Patient presents with   ??? Follow-up     patient had very bad vertigo, PT at ENT 4 times   ??? Headache   ??? Dizziness       HPI/Subjective  Kayla Shaffer is a 68 y.o. female who presented to the neurology office for management of migraines.     She has had migraines for a long time. It started around the age of 68. She does have headaches on the left side of the head. It is sharp, throbbing and aching in character. It is around 10/10 in severity. There is associated photophobia, phonophobia, nausea.  If she takes zomig, it lasts for around 12 hrs. she does have significant drowsiness on Topamax.  She is presently taking 50 mg in the morning and 100 mg at night.    [place] [facility] [place] [facility] [place]. 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     The patient is also complaining of poor balance and numbness in the legs.    Interval history:  Headache is better  Dizziness is better as well    Baseline headache frequency: 12/month  Headache frequency now:  6-9/MONTH    Risk Factors for headaches  Smoking: denies  Coffee: 1 cups/day  Tea: 3-4 cups/day  Soda: 2/week  Water: 8-10 glasses/day  Sleeps at 9 pm in her couch and wakes up at 11 pm and then she goes to her bed and she does wake up at 9 am. She does go to bed again around 1 pm and sleeps for 2-3 hrs.. She does snore.    Medications tried  Preventative therapy:  Topamax  Cymbalta  Depakote  Botox    Abortive therapy:  Zomig     Current Outpatient Medications   Medication Sig   ??? metFORMIN (GLUCOPHAGE) 500 mg tablet Take 500 mg by mouth two (2) times daily (with meals). Take one 500mg  tablet in the morning, and one in the evening   ??? erenumab-aooe (AIMOVIG AUTOINJECTOR) 140 mg/mL injection 1 mL by SubCUTAneous route every thirty (30) days.   ??? pantoprazole (PROTONIX) 40 mg tablet Take 40 mg by mouth daily. Take one 40mg  tablet in the morning, and one at night   ??? buPROPion XL (WELLBUTRIN  XL) 300 mg XL tablet Take 300 mg by mouth every morning.   ??? DULoxetine (CYMBALTA) 20 mg capsule Take 20 mg by mouth nightly.   ??? nizatidine (AXID PO) Take 300 mg by mouth nightly.   ??? estradiol (VAGIFEM) 10 mcg tab vaginal tablet Insert 10 mcg into vagina every seven (7) days.   ??? topiramate (TOPAMAX) 25 mg tablet Take 25 mg by mouth two (2) times a day. Taking 1 tab in morning and 2 tabs at night   ??? denosumab (PROLIA) 60 mg/mL injection 60 mg by SubCUTAneous route every 6 months.   ??? ZOLMitriptan (ZOMIG ZMT) 5 mg disintegrating tablet Take 5 mg by mouth as needed for Migraine.   ??? fluticasone (FLOVENT HFA) 110 mcg/actuation inhaler Take 2 Puffs by inhalation every twelve (12) hours.   ??? albuterol (PROVENTIL HFA) 90 mcg/actuation inhaler Take 2 Puffs by inhalation as needed for Wheezing.   ??? atorvastatin (LIPITOR) 40 mg tablet Take 40 mg by mouth nightly.     No current facility-administered medications for this visit.      Allergies   Allergen Reactions   ??? Ceftin [  Cefuroxime Axetil] Anaphylaxis     Throat almost closed off   ??? Bactrim [Sulfamethoprim] Swelling     Throat swelling   ??? Codeine Swelling     throat   ??? Sulfa (Sulfonamide Antibiotics) Swelling     Swollen throat     Past Medical History:   Diagnosis Date   ??? Asthma     last flare up over a year ago as stated 09/02/2017   ??? Barrett esophagus    ??? Diabetes (HCC)     no medications, just watching   ??? Hiatal hernia     sliding   ??? Migraine    ??? Osteoarthritis    ??? Osteoporosis      Past Surgical History:   Procedure Laterality Date   ??? COLONOSCOPY N/A 09/18/2017    COLONOSCOPY/EGD performed by Blake Divine, [place] at [facility]   ??? HX APPENDECTOMY     ??? HX BREAST BIOPSY Left     Stereotactic biopsy. Benign. Long time ago. Clip.   ??? HX CARPAL TUNNEL RELEASE Bilateral    ??? HX CATARACT REMOVAL Bilateral 2016   ??? HX COLONOSCOPY     ??? HX ENDOSCOPY     ??? HX HYSTERECTOMY      a long time ago   ??? HX INTRACRANIAL ANEURYSM REPAIR Right 10/2015    behind  right eye, clip, done at [facility], safe for MRI   ??? HX OPEN REDUCTION INTERNAL FIXATION Right 1993    no hardware, wrist   ??? HX OPEN REDUCTION INTERNAL FIXATION Left 2014    wrist, no hardware     Family History   Problem Relation Age of Onset   ??? Breast Cancer Paternal Grandmother         onset: 12s   ??? Dementia Mother    ??? Headache Mother    ??? Heart Disease Father    ??? Parkinsonism Father    ??? Stroke Father      Social History     Tobacco Use   ??? Smoking status: Former Smoker   ??? Smokeless tobacco: Never Used   Substance Use Topics   ??? Alcohol use: Not Currently     Comment: socially, mixed drink   ??? Drug use: No       REVIEW OF SYSTEMS:   A ten system review of constitutional, cardiovascular, respiratory, musculoskeletal, endocrine, skin, SHEENT, genitourinary, psychiatric and neurologic systems was obtained and is unremarkable with the exception of anxiety, chest pain, constipation, cough, depression, diarrhea, falls, fatigue, frequent headache, joint pain, memory loss, muscle pain, muscle weakness, nausea, rash, ringing in the ear, snoring, stomach pain, vertigo and visual disturbance    EXAMINATION:   Visit Vitals  BP (!) 142/95   Pulse 97   Ht 5\' 2"  (1.575 m)   Wt 191 lb (86.6 kg)   SpO2 100%   BMI 34.93 kg/m??        General:   General appearance: Pt is in no acute distress   Distal pulses are preserved  Fundoscopic Exam: Normal    Neurological Examination:   Mental Status: AAO x3. Speech is fluent. Follows commands, has normal fund of knowledge, attention, short term recall, comprehension and insight.     Cranial Nerves: Visual fields are full. PERRL, Extraocular movements are full. Facial sensation intact. Facial movement intact. Hearing intact to conversation. Palate elevates symmetrically. Shoulder shrug symmetric. Tongue midline.     Motor: Strength is 5/5 in all 4 ext. No  atrophy.     Tone: Normal    Sensation: Light touch - Normal    Reflexes: DTRs 2+ throughout.      Coordination/Cerebellar:  Intact to finger-nose-finger     Gait: Casual gait is normal.     Skin: No significant bruising or lacerations.    Laboratory review:   Results for orders placed or performed during the hospital encounter of 01/07/19   EKG, 12 LEAD, INITIAL   Result Value Ref Range    Ventricular Rate 101 BPM    Atrial Rate 101 BPM    P-R Interval 154 ms    QRS Duration 72 ms    Q-T Interval 344 ms    QTC Calculation (Bezet) 446 ms    Calculated P Axis 70 degrees    Calculated R Axis 11 degrees    Calculated T Axis 91 degrees    Diagnosis       Sinus tachycardia  Right atrial enlargement  Abnormal QRS-T angle, consider primary T wave abnormality  When compared with ECG of 26-Apr-2018 12:13,  No significant change was found  Confirmed by Ravindra, P.V. ([id number]) on 01/08/2019 4:42:44 PM         Imaging review:  01/29/2019  Sleep study  Positive home sleep test for sleep apnea.  Recommend positive airway pressure trial    10/12/2018  EMG/nerve conduction study  Normal    Documentation review:  None    Assessment/Plan:   1. Idiopathic peripheral neuropathy  The patient does have poor balance and on examination has decreased pinprick sensation distally in the lower extremities.  To suspect peripheral neuropathy.  She states that her blood sugars stay around 200.  EMG/NCS was normal. Blood work showed DM. Mild improvement with vestibular rehab.     2. Intractable chronic migraine without aura and without status migrainosus  The patient does have chronic intractable migraines and has tried Botox, Depakote, Cymbalta and Topamax.      - She is on topamax 25 mg in am and 50 mg po qhs. She is having memory problems. Will discontinue it over 1 wk.   - She is on aimovig and will continue the same.    3. Hx of cerebral aneurysm repair  She did have aneurysm clipping in February 2017.  Doing well.    4. OSA  She was recently diagnosed with OSA and is going for CPAP titration    Follow-up in 4 months.    No flowsheet data found.  Primary care to  address possible depression if PHQ-9 score is more than 9.      ICD-10-CM ICD-9-CM    1. Idiopathic peripheral neuropathy G60.9 356.9    2. Intractable chronic migraine without aura and without status migrainosus G43.719 346.71    3. Hx of cerebral aneurysm repair Z98.890 V45.89     Z86.79     4. Snoring R06.83 786.09       Thank you for allowing me to participate in the care of Ms. Dobie. Please feel free to contact me if you have any questions.    Electronically signed.   Alnita Aybar, MD  Neurologist    CC: Domah, Nau D, MD  Fax: [phone]    This note was created using voice recognition software. Despite editing, there may be syntax errors.

## 2019-02-17 NOTE — Patient Instructions (Signed)
1.  Decrease Topamax to 25 mg p.o. twice daily for 3 days, 25 mg at night for 3 days and then discontinue

## 2019-06-01 ENCOUNTER — Encounter: Attending: Neurology | Primary: Family Medicine

## 2019-09-25 ENCOUNTER — Encounter

## 2019-09-25 DIAGNOSIS — K900 Celiac disease: Secondary | ICD-10-CM | POA: Insufficient documentation

## 2019-09-25 DIAGNOSIS — G4739 Other sleep apnea: Secondary | ICD-10-CM | POA: Insufficient documentation

## 2019-09-25 DIAGNOSIS — H8111 Benign paroxysmal vertigo, right ear: Secondary | ICD-10-CM | POA: Insufficient documentation

## 2019-09-25 DIAGNOSIS — G4733 Obstructive sleep apnea (adult) (pediatric): Secondary | ICD-10-CM | POA: Insufficient documentation

## 2019-09-25 DIAGNOSIS — H8110 Benign paroxysmal vertigo, unspecified ear: Secondary | ICD-10-CM | POA: Insufficient documentation

## 2019-09-28 MED ORDER — AIMOVIG AUTOINJECTOR 140 MG/ML SUBCUTANEOUS AUTO-INJECTOR
140 mg/mL | SUBCUTANEOUS | 3 refills | Status: DC
Start: 2019-09-28 — End: 2020-01-10

## 2019-10-20 ENCOUNTER — Encounter: Payer: PRIVATE HEALTH INSURANCE | Attending: Neurology | Primary: Family Medicine

## 2019-11-29 ENCOUNTER — Telehealth: Attending: Adult Health | Primary: Family Medicine

## 2019-11-29 ENCOUNTER — Telehealth: Admit: 2019-11-29 | Payer: PRIVATE HEALTH INSURANCE | Attending: Registered" | Primary: Family Medicine

## 2019-11-29 DIAGNOSIS — E119 Type 2 diabetes mellitus without complications: Secondary | ICD-10-CM

## 2019-11-29 NOTE — Progress Notes (Signed)
 [facility]  Diabetes Self-Management Education & Support Program  Pre-program Assessment    Reason for Referral: requests diabetes education- no education has been received in the past  Referral Source: Lorren Hacker, MD  Services requested: DSMES    ASSESSMENT    From my perspective, the participant would benefit from [facility] specifically related to Reducing risks, Healthy eating, Monitoring, Physical activity, Taking medications, Healthy coping and Problem solving. Will adapt DSMES program to build on participant's skills score, confidence score and preparedness score as noted in the Diabetes Skills, Confidence, and Preparedness Index.    During the program, we will focus on providing DSMES that specifically addresses participant's interest in Healthy eating, as shown by their reported readiness to change.    The participant would be best served by attending weekly individual sessions.    Diabetes Self-Management Education Follow-up Visit: 12/13/2019       Clinical Presentation  Kayla Shaffer is a 69 y.o. White female referred for diabetes self-management education. Participant has Type 2 DM not on insulin for 1-10 years. Family history positive for diabetes. Patient reports not receiving DSMES services in the past. Most recent A1c value:   Lab Results   Component Value Date/Time    Hemoglobin A1c 7.0 (H) 10/15/2018 10:56 AM       Diabetes-related medical history:  Celiac disease- for 1 years  Neuropathy  Sleep apnea      Diabetes-related medications:  Current dosing:     Bydureon- takes on Thursday      Blood Pressure Management  None      Lipid Management  Lipitor - 40 mg - pm    Clot Prevention  none    Learning Assessment  Learning objectives Educator assessment (11/29/2019)   Diabetes Disease Process  The participant can   A) describe diabetes in basic terms;   B) state the type of diabetes they have; &   C) state accepted blood glucose targets.     Healthy Eating  The  participant can   A) identify carbohydrate foods; &   B) accurately read food labels.     Being Active  The participant can  A) state the benefits of physical activity;  B) report their current PA practices;  C) identify PA they would consider incorporating in their lives; &  D) develop an implementation plan.     Monitoring  The participant can  A) operate their blood glucose meter; &  B) describe how they log their blood glucoses to share with their provider.     Taking Medications  The participant can  A) name their diabetes medications;  B) state the purpose and dose;  C) note side effects; &  D) describe proper storage, disposal & transport (if appropriate).     Healthy Coping  The participant can    A) describe their response to diabetes diagnosis;  B) describe their specific coping mechanisms;  C) identify supportive people and/or other resources that positively support their diabetes self-care and health.    Reducing Risks  The participant can describe the preventive measures used by providers to promote health and prevent diabetes complications.     Problem Solving  The participant can   A) identify signs, symptoms & treatment of hypoglycemia;   B) identify signs, symptoms & treatment of hyperglycemia;  C) describe their sick day plan; &  D) identify BG patterns to discuss with their provider.       No  Yes  No        Yes  Yes        Yes  Yes  No  Yes        Yes  Yes         No  No  Yes  Yes        Yes  Yes- stress eater  Yes        Yes          No  Yes  No  Yes     Characteristics to Learning   Barriers to Learning   []  Cognitive loss  []  Mental retardation       []  Psychiatric disorder  []  Visually impaired  []  Hearing loss                 []  Low literacy  []  Low health literacy  []  Language  []  Functional limitation  []  Pain   []  Financial  []  Transportation  []  None   Favorite Ways to Learn   []  Lecture  []  Slides  []  Reading []  Video-Internet  []  Cassettes/CDs/MP3's  []  Interactive Small Groups []   Other       Behavioral Assessment  Current self-care practices  Educator assessment (11/29/2019)   Healthy Eating  Current practices  24-hour Dietary Recall:  Breakfast: gluten free bread, egg, coffee- 1/2 and 1/2  Lunch: skips  Dinner: spaghetti with gluten free pasta, salad  Snacks: mixed nuts  Beverages: water, coffee, hot chocolate (SF)  Alcohol: none     Would benefit from DSMES related to Healthy Eating: Yes      Eats a carbohydrate controlled diet: No      Stage of change: Action   Being Active  Current practices  How many days during the past week have you performed physical activity where your heart beats faster and your breathing is harder than normal for 30 minutes or more?  0 days    How many days in a typical week do you perform activity such as this?  0 days     Would benefit from [facility] related to Being Active: Yes      Exercises 150 minutes/week: No      Stage of change: Action     Monitoring  Current practices  Do you monitor your blood sugar? Yes    How often do you monitor? 1x/day    What are the range of readings? 150-210 mg/dL    Before Breakfast: 869-839 mg/dL      Do you know your last A1c measurement? Yes    Do you know the meaning of the A1c? Yes     Would benefit from [facility] related to Monitoring: Yes      Uses BG readings to establish trends and understand BG patterns: No      Stage of change: Action   Taking Medication  Current practices  Do you understand what your diabetes medications do? No    How often do you miss doses of your diabetes medications? Never    Can you afford your diabetes medications? Yes   Would benefit from [facility] related to Taking Medication: Yes      Takes medications consistently to receive full benefit: Yes      Stage of change: Action       Healthy Coping   Current state  Diabetes Skills, Confidence and Preparedness Index:  Total score: 3.5  Skills: 3.1  Confidence: 2.4  Preparedness: 5.2   Would benefit  from [facility] related to Healthy Coping: Yes      Identifies  specific people, organizations,etc, that actively support their diabetes self-care efforts: Yes      Stage of change: Action     Reducing Risks  Current state  Vaccines:  Influenza:   There is no immunization history on file for this patient.    Pneumococcal:   There is no immunization history on file for this patient.     Hepatitis:   There is no immunization history on file for this patient.    Examinations:  Diabetic Foot and Eye Exam HM Status   Topic Date Due   . Diabetic Foot Care  Never done   . Eye Exam  Never done        Dental exam: Last appointment was: 3/21    Foot exam: states she sees a podiatrist    Heart Protection:  BP Readings from Last 2 Encounters:   02/17/19 (!) 142/95   01/19/19 126/86        No results found for: LDL, LDLC, DLDLP     Kidney Protection:  No results found for: MCACR, MCA1, MCA2, MCA3, MCAU, MCAU2, MCALPOCT     Would benefit from [facility] related to Reducing Risks: Yes      Actively participates in decision-making with provider regarding secondary prevention:  No      Stage of change: Action   Problem Solving  Current state  Hypoglycemia Management:  What are signs and symptoms of hypoglycemia that you experience? has not experienced    How do you prevent hypoglycemia? Pt reported being unaware of how to prevent hypoglycemia    How do you treat hypoglycemia? Pt reported being unaware of how to treat hypoglycemia    Hyperglycemia Management:  What are signs and symptoms of hyperglycemia that you experience? Fatigue    How can you prevent hyperglycemia? Pt reported being unaware of how to prevent hyperglycemia    Sick Day Management:  What do you do differently on sick days? Pt reported being unaware of self-management on sick days    Pattern Management:  Do you notice blood glucose patterns when you look at the readings in your meter or logbook? No    How do you use the blood glucose readings from your meter or logbook? Understand how body responds to meals     Would benefit from  [facility] related to Problem Solving: Yes      Articulates appropriate strategies to address hypoglycemia, hyperglycemia, sick day care and BG pattern: No      Stage of change: Action     Note: Content derived from the American Association of Diabetes Educators' Diabetes Education Curriculum: A Guide to Successful Self-Management (2nd edition)      Arvin LELON Gins, RD on 11/29/2019 at 1:16 PM    COVID-19 Public Health Emergency Adaptations for Telehealth:  Kayla Shaffer is a 69 y.o. female being evaluated through a synchronous (real-time) audio-video technology platform, as a substitution for an in-person encounter, to address the healthcare issues mentioned above. I was in the office. The patient was at home. A caregiver was present when appropriate. The patient and/or her healthcare decision maker, is aware that this patient-initiated, Telehealth encounter on 11/29/2019 is a billable service, with coverage as determined by her insurance carrier. She is aware that she may receive a bill and has provided verbal consent to proceed: Yes.     This telehealth encounter occurred during the COVID-19 pandemic and public health emergency.  Evaluation of the following organ systems was limited: Vitals/Constitutional/EENT/Resp/CV/GI/GU/MS/Neuro/Skin/Heme-Lymph-Imm.  Pursuant to the emergency declaration under the [facility] Act and the [employer], 1135 waiver authority and the [employer] and [employer] Act, this Virtual Visit was conducted with patient's (and/or legal guardian's) consent, to reduce the risk of exposure to COVID-19 and provide necessary medical care.     Time in appointment: 37 minutes          [facility] Logo  Diabetes Skills, Confidence & Preparedness Index (SCPI)   Thank you for completing the Skills, Confidence & Preparedness Index!  Below are your scores.  All scales and questions are out of 7.  If you would like these results emailed, please enter your email  address along with some identifying patient information.  Email:    Patient Identifier:        Overall SCPI score: 3.5 Skills Score: 3.1  Low: Taking Medication(Q2),Reducing Risks(Q5),Problem Solving(Q6),Healthy Coping(Q7),Blood Sugar Monitoring(Q8),Reducing Risks(Q9) Confidence Score: 2.4  Low: Healthy Coping(Q2),Reducing Risks(Q3),Healthy Eating(Q4),Blood Sugar Monitoring(Q6),Problem Dnocpwh(V2) Preparedness Score: 5.2  Low: Being Active(Q2),Healthy Coping(Q3),Reducing Risks(Q4),Blood Sugar Monitoring(Q6),Problem Dnocpwh(V2)  Healthy Eating Score: 3.8  Low: Confidence(Q4) Taking Medication Score: 2.0  Low: Skills(Q2) Blood Sugar Monitoring Score: 4.2  Low: Skills(Q8),Confidence(Q6) Reducing Risks Score: 2.8  Low: Skills(Q5),Skills(Q9),Confidence(Q3)  Problem Solving Score: 3.0  Low: Skills(Q6),Confidence(Q7) Healthy Coping Score: 3.0  Low: Skills(Q7),Confidence(Q2) Being Active Score: 4.5  Low: Confidence(Q5)    Skills/Knowledge Questions  1. I know how to plan meals that have the best balance between carbohydrates, proteins and vegetables. 4  2. I know how my diabetes medications (pills, injectables and/or insulin) work in my body. 2  3. I know when to check my blood sugar if I want to see how my body responded to a meal. 6  4. I know when to check my blood sugars to determine if my medication or insulin doses are correct. 6  5. I know what to do to prevent a low blood sugar when I exercise (either before, during, or after). 2  6. When I am sick, I know what to do differently with my diabetes management. 2  7. I know how stress can affect my diabetes management. 2  8. When I look at my blood sugars over a given week, I can explain what my blood sugar pattern is. 2  9. I know what my target levels are for A1c, blood pressure and cholesterol. 2  Confidence Questions  1. I am confident that I can plan balanced meals and snacks. 3  2. I am confident that I can manage my stress. 2  3. I am confident that I can prevent  a low blood sugar during or after exercise. 2  4. I am confident that the next time I eat out, I will be able to choose foods that best keep my blood sugars in target. 2  5. I am confident I can include exercise into my schedule. 4  6. I am confident that I can use my daily blood sugars to adjust my diet, my activity, and/or my insulin. 2  7. When something out of my normal routine happens, I am confident that I can problem-solve and keep my diabetes on track. 2  Preparedness Questions  1. Within the next month, I will begin to eat more balanced meals and snacks. 6  2. Within the next month, I will choose an exercise activity and I will start fitting it into my schedule. 5  3. Within the next month,  I will make a list of stress management options that work for me. 5  4. Within the next month, I will consistently plan ahead to prevent low blood sugars. 5  5. Within the next month, I will start adjusting my insulin doses on my own. 0  6. Within the next month, I will begin making changes to my diabetes management based on my daily blood sugars (eg - eating, activity and/or insulin). 5  7. Within the next month, I will begin making changes to my diabetes management to meet my overall goals (eg - eating, activity and/or insulin). 5    [facility] Logo  Diabetes Skills, Confidence & Preparedness Index (SCPI)   Thank you for completing the Skills, Confidence & Preparedness Index!  Below are your scores.  All scales and questions are out of 7.  If you would like these results emailed, please enter your email address along with some identifying patient information.  Email:    Patient Identifier:        Overall SCPI score: 3.5 Skills Score: 3.1  Low: Taking Medication(Q2),Reducing Risks(Q5),Problem Solving(Q6),Healthy Coping(Q7),Blood Sugar Monitoring(Q8),Reducing Risks(Q9) Confidence Score: 2.4  Low: Healthy Coping(Q2),Reducing Risks(Q3),Healthy Eating(Q4),Blood Sugar Monitoring(Q6),Problem Dnocpwh(V2) Preparedness Score:  5.2  Low: Being Active(Q2),Healthy Coping(Q3),Reducing Risks(Q4),Blood Sugar Monitoring(Q6),Problem Dnocpwh(V2)  Healthy Eating Score: 3.8  Low: Confidence(Q4) Taking Medication Score: 2.0  Low: Skills(Q2) Blood Sugar Monitoring Score: 4.2  Low: Skills(Q8),Confidence(Q6) Reducing Risks Score: 2.8  Low: Skills(Q5),Skills(Q9),Confidence(Q3)  Problem Solving Score: 3.0  Low: Skills(Q6),Confidence(Q7) Healthy Coping Score: 3.0  Low: Skills(Q7),Confidence(Q2) Being Active Score: 4.5  Low: Confidence(Q5)    Skills/Knowledge Questions  1. I know how to plan meals that have the best balance between carbohydrates, proteins and vegetables. 4  2. I know how my diabetes medications (pills, injectables and/or insulin) work in my body. 2  3. I know when to check my blood sugar if I want to see how my body responded to a meal. 6  4. I know when to check my blood sugars to determine if my medication or insulin doses are correct. 6  5. I know what to do to prevent a low blood sugar when I exercise (either before, during, or after). 2  6. When I am sick, I know what to do differently with my diabetes management. 2  7. I know how stress can affect my diabetes management. 2  8. When I look at my blood sugars over a given week, I can explain what my blood sugar pattern is. 2  9. I know what my target levels are for A1c, blood pressure and cholesterol. 2  Confidence Questions  1. I am confident that I can plan balanced meals and snacks. 3  2. I am confident that I can manage my stress. 2  3. I am confident that I can prevent a low blood sugar during or after exercise. 2  4. I am confident that the next time I eat out, I will be able to choose foods that best keep my blood sugars in target. 2  5. I am confident I can include exercise into my schedule. 4  6. I am confident that I can use my daily blood sugars to adjust my diet, my activity, and/or my insulin. 2  7. When something out of my normal routine happens, I am confident that I can  problem-solve and keep my diabetes on track. 2  Preparedness Questions  1. Within the next month, I will begin to eat more balanced meals and snacks. 6  2. Within the next month, I will choose an exercise activity and I will start fitting it into my schedule. 5  3. Within the next month, I will make a list of stress management options that work for me. 5  4. Within the next month, I will consistently plan ahead to prevent low blood sugars. 5  5. Within the next month, I will start adjusting my insulin doses on my own. 0  6. Within the next month, I will begin making changes to my diabetes management based on my daily blood sugars (eg - eating, activity and/or insulin). 5  7. Within the next month, I will begin making changes to my diabetes management to meet my overall goals (eg - eating, activity and/or insulin). 5

## 2019-11-29 NOTE — Progress Notes (Signed)
[facility]  Diabetes Self-Management Education & Support Program  Pre-program Assessment    Reason for Referral: requests diabetes education- no education has been received in the past  Referral Source: Joycie Peek, MD  Services requested: DSMES    ASSESSMENT    From my perspective, the participant would benefit from [facility] specifically related to Reducing risks, Healthy eating, Monitoring, Physical activity, Taking medications, Healthy coping and Problem solving. Will adapt DSMES program to build on participant's skills score, confidence score and preparedness score as noted in the Diabetes Skills, Confidence, and Preparedness Index.    During the program, we will focus on providing DSMES that specifically addresses participant's interest in Healthy eating, as shown by their reported readiness to change.    The participant would be best served by attending weekly individual sessions.    Diabetes Self-Management Education Follow-up Visit: 12/13/2019       Clinical Presentation  Kayla Shaffer is a 69 y.o. White female referred for diabetes self-management education. Participant has Type 2 DM not on insulin for 1-10 years. Family history positive for diabetes. Patient reports not receiving DSMES services in the past. Most recent A1c value:   Lab Results   Component Value Date/Time    Hemoglobin A1c 7.0 (H) 10/15/2018 10:56 AM       Diabetes-related medical history:  Celiac disease- for 1 years  Neuropathy  Sleep apnea      Diabetes-related medications:  Current dosing:     Bydureon- takes on Thursday      Blood Pressure Management  None      Lipid Management  Lipitor - 40 mg - pm    Clot Prevention  none    Learning Assessment  Learning objectives Educator assessment (11/29/2019)   Diabetes Disease Process  The participant can   A) describe diabetes in basic terms;   B) state the type of diabetes they have; &   C) state accepted blood glucose targets.     Healthy Eating  The  participant can   A) identify carbohydrate foods; &   B) accurately read food labels.     Being Active  The participant can  A) state the benefits of physical activity;  B) report their current PA practices;  C) identify PA they would consider incorporating in their lives; &  D) develop an implementation plan.     Monitoring  The participant can  A) operate their blood glucose meter; &  B) describe how they log their blood glucoses to share with their provider.     Taking Medications  The participant can  A) name their diabetes medications;  B) state the purpose and dose;  C) note side effects; &  D) describe proper storage, disposal & transport (if appropriate).     Healthy Coping  The participant can    A) describe their response to diabetes diagnosis;  B) describe their specific coping mechanisms;  C) identify supportive people and/or other resources that positively support their diabetes self-care and health.    Reducing Risks  The participant can describe the preventive measures used by providers to promote health and prevent diabetes complications.     Problem Solving  The participant can   A) identify signs, symptoms & treatment of hypoglycemia;   B) identify signs, symptoms & treatment of hyperglycemia;  C) describe their sick day plan; &  D) identify BG patterns to discuss with their provider.       No  Yes  No        Yes  Yes        Yes  Yes  No  Yes        Yes  Yes         No  No  Yes  Yes        Yes  Yes- stress eater  Yes        Yes          No  Yes  No  Yes     Characteristics to Learning   Barriers to Learning   []  Cognitive loss  []  Mental retardation       []  Psychiatric disorder  []  Visually impaired  []  Hearing loss                 []  Low literacy  []  Low health literacy  []  Language  []  Functional limitation  []  Pain   []  Financial  []  Transportation  []  None   Favorite Ways to Learn   []  Lecture  []  Slides  []  Reading []  Video-Internet  []  Cassettes/CDs/MP3's  []  Interactive Small Groups []   Other       Behavioral Assessment  Current self-care practices  Educator assessment (11/29/2019)   Healthy Eating  Current practices  24-hour Dietary Recall:  Breakfast: gluten free bread, egg, coffee- 1/2 and 1/2  Lunch: skips  Dinner: spaghetti with gluten free pasta, salad  Snacks: mixed nuts  Beverages: water, coffee, hot chocolate (SF)  Alcohol: none     Would benefit from DSMES related to Healthy Eating: Yes      Eats a carbohydrate controlled diet: No      Stage of change: Action   Being Active  Current practices  How many days during the past week have you performed physical activity where your heart beats faster and your breathing is harder than normal for 30 minutes or more?  0 days    How many days in a typical week do you perform activity such as this?  0 days     Would benefit from [facility] related to Being Active: Yes      Exercises 150 minutes/week: No      Stage of change: Action     Monitoring  Current practices  Do you monitor your blood sugar? Yes    How often do you monitor? 1x/day    What are the range of readings? 150-210 mg/dL    Before Breakfast: 130-160 mg/dL      Do you know your last A1c measurement? Yes    Do you know the meaning of the A1c? Yes     Would benefit from [facility] related to Monitoring: Yes      Uses BG readings to establish trends and understand BG patterns: No      Stage of change: Action   Taking Medication  Current practices  Do you understand what your diabetes medications do? No    How often do you miss doses of your diabetes medications? Never    Can you afford your diabetes medications? Yes   Would benefit from [facility] related to Taking Medication: Yes      Takes medications consistently to receive full benefit: Yes      Stage of change: Action       Healthy Coping   Current state  Diabetes Skills, Confidence and Preparedness Index:  Total score: 3.5  Skills: 3.1  Confidence: 2.4  Preparedness: 5.2   Would benefit  from [facility] related to Healthy Coping: Yes      Identifies  specific people, organizations,etc, that actively support their diabetes self-care efforts: Yes      Stage of change: Action     Reducing Risks  Current state  Vaccines:  Influenza:   There is no immunization history on file for this patient.    Pneumococcal:   There is no immunization history on file for this patient.     Hepatitis:   There is no immunization history on file for this patient.    Examinations:  Diabetic Foot and Eye Exam HM Status   Topic Date Due   ??? Diabetic Foot Care  Never done   ??? Eye Exam  Never done        Dental exam: Last appointment was: 3/21    Foot exam: states she sees a podiatrist    Heart Protection:  BP Readings from Last 2 Encounters:   02/17/19 (!) 142/95   01/19/19 126/86        No results found for: LDL, LDLC, DLDLP     Kidney Protection:  No results found for: MCACR, MCA1, MCA2, MCA3, MCAU, MCAU2, MCALPOCT     Would benefit from [facility] related to Reducing Risks: Yes      Actively participates in decision-making with provider regarding secondary prevention:  No      Stage of change: Action   Problem Solving  Current state  Hypoglycemia Management:  What are signs and symptoms of hypoglycemia that you experience? has not experienced    How do you prevent hypoglycemia? Pt reported being unaware of how to prevent hypoglycemia    How do you treat hypoglycemia? Pt reported being unaware of how to treat hypoglycemia    Hyperglycemia Management:  What are signs and symptoms of hyperglycemia that you experience? Fatigue    How can you prevent hyperglycemia? Pt reported being unaware of how to prevent hyperglycemia    Sick Day Management:  What do you do differently on sick days? Pt reported being unaware of self-management on sick days    Pattern Management:  Do you notice blood glucose patterns when you look at the readings in your meter or logbook? No    How do you use the blood glucose readings from your meter or logbook? Understand how body responds to meals     Would benefit from  [facility] related to Problem Solving: Yes      Articulates appropriate strategies to address hypoglycemia, hyperglycemia, sick day care and BG pattern: No      Stage of change: Action     Note: Content derived from the American Association of Diabetes Educators' Diabetes Education Curriculum: A Guide to Successful Self-Management (2nd edition)      Flonnie Hailstone, RD on 11/29/2019 at 1:16 PM    COVID-19 Public Health Emergency Adaptations for Telehealth:  Kayla Shaffer is a 69 y.o. female being evaluated through a synchronous (real-time) audio-video technology platform, as a substitution for an in-person encounter, to address the healthcare issues mentioned above. I was in the office. The patient was at home. A caregiver was present when appropriate. The patient and/or her healthcare decision maker, is aware that this patient-initiated, Telehealth encounter on 11/29/2019 is a billable service, with coverage as determined by her insurance carrier. She is aware that she may receive a bill and has provided verbal consent to proceed: Yes.     This telehealth encounter occurred during the COVID-19 pandemic and public health emergency.  Evaluation of the following organ systems was limited: Vitals/Constitutional/EENT/Resp/CV/GI/GU/MS/Neuro/Skin/Heme-Lymph-Imm.  Pursuant to the emergency declaration under the [facility] Act and the [employer], 1135 waiver authority and the [employer] and [employer] Act, this Virtual Visit was conducted with patient's (and/or legal guardian's) consent, to reduce the risk of exposure to COVID-19 and provide necessary medical care.     Time in appointment: 37 minutes          [facility] Logo  Diabetes Skills, Confidence & Preparedness Index (SCPI) ??  Thank you for completing the Skills, Confidence & Preparedness Index!  Below are your scores.  All scales and questions are out of 7.  If you would like these results emailed, please enter your email  address along with some identifying patient information.  Email:    Patient Identifier:        Overall SCPI score: 3.5 Skills Score: 3.1  Low: Taking Medication(Q2),Reducing Risks(Q5),Problem Solving(Q6),Healthy Coping(Q7),Blood Sugar Monitoring(Q8),Reducing Risks(Q9) Confidence Score: 2.4  Low: Healthy Coping(Q2),Reducing Risks(Q3),Healthy Eating(Q4),Blood Sugar Monitoring(Q6),Problem EEFEOFH(Q1) Preparedness Score: 5.2  Low: Being Active(Q2),Healthy Coping(Q3),Reducing Risks(Q4),Blood Sugar Monitoring(Q6),Problem FXJOITG(P4)  Healthy Eating Score: 3.8  Low: Confidence(Q4) Taking Medication Score: 2.0  Low: Skills(Q2) Blood Sugar Monitoring Score: 4.2  Low: Skills(Q8),Confidence(Q6) Reducing Risks Score: 2.8  Low: Skills(Q5),Skills(Q9),Confidence(Q3)  Problem Solving Score: 3.0  Low: Skills(Q6),Confidence(Q7) Healthy Coping Score: 3.0  Low: Skills(Q7),Confidence(Q2) Being Active Score: 4.5  Low: Confidence(Q5)    Skills/Knowledge Questions  1. I know how to plan meals that have the best balance between carbohydrates, proteins and vegetables. 4  2. I know how my diabetes medications (pills, injectables and/or insulin) work in my body. 2  3. I know when to check my blood sugar if I want to see how my body responded to a meal. 6  4. I know when to check my blood sugars to determine if my medication or insulin doses are correct. 6  5. I know what to do to prevent a low blood sugar when I exercise (either before, during, or after). 2  6. When I am sick, I know what to do differently with my diabetes management. 2  7. I know how stress can affect my diabetes management. 2  8. When I look at my blood sugars over a given week, I can explain what my blood sugar pattern is. 2  9. I know what my target levels are for A1c, blood pressure and cholesterol. 2  Confidence Questions  1. I am confident that I can plan balanced meals and snacks. 3  2. I am confident that I can manage my stress. 2  3. I am confident that I can prevent  a low blood sugar during or after exercise. 2  4. I am confident that the next time I eat out, I will be able to choose foods that best keep my blood sugars in target. 2  5. I am confident I can include exercise into my schedule. 4  6. I am confident that I can use my daily blood sugars to adjust my diet, my activity, and/or my insulin. 2  7. When something out of my normal routine happens, I am confident that I can problem-solve and keep my diabetes on track. 2  Preparedness Questions  1. Within the next month, I will begin to eat more balanced meals and snacks. 6  2. Within the next month, I will choose an exercise activity and I will start fitting it into my schedule. 5  3. Within the next month,  I will make a list of stress management options that work for me. 5  4. Within the next month, I will consistently plan ahead to prevent low blood sugars. 5  5. Within the next month, I will start adjusting my insulin doses on my own. 0  6. Within the next month, I will begin making changes to my diabetes management based on my daily blood sugars (eg - eating, activity and/or insulin). 5  7. Within the next month, I will begin making changes to my diabetes management to meet my overall goals (eg - eating, activity and/or insulin). 5    [facility] Logo  Diabetes Skills, Confidence & Preparedness Index (SCPI) ??  Thank you for completing the Skills, Confidence & Preparedness Index!  Below are your scores.  All scales and questions are out of 7.  If you would like these results emailed, please enter your email address along with some identifying patient information.  Email:    Patient Identifier:        Overall SCPI score: 3.5 Skills Score: 3.1  Low: Taking Medication(Q2),Reducing Risks(Q5),Problem Solving(Q6),Healthy Coping(Q7),Blood Sugar Monitoring(Q8),Reducing Risks(Q9) Confidence Score: 2.4  Low: Healthy Coping(Q2),Reducing Risks(Q3),Healthy Eating(Q4),Blood Sugar Monitoring(Q6),Problem GGYIRSW(N4) Preparedness Score: 5.2   Low: Being Active(Q2),Healthy Coping(Q3),Reducing Risks(Q4),Blood Sugar Monitoring(Q6),Problem OEVOJJK(K9)  Healthy Eating Score: 3.8  Low: Confidence(Q4) Taking Medication Score: 2.0  Low: Skills(Q2) Blood Sugar Monitoring Score: 4.2  Low: Skills(Q8),Confidence(Q6) Reducing Risks Score: 2.8  Low: Skills(Q5),Skills(Q9),Confidence(Q3)  Problem Solving Score: 3.0  Low: Skills(Q6),Confidence(Q7) Healthy Coping Score: 3.0  Low: Skills(Q7),Confidence(Q2) Being Active Score: 4.5  Low: Confidence(Q5)    Skills/Knowledge Questions  1. I know how to plan meals that have the best balance between carbohydrates, proteins and vegetables. 4  2. I know how my diabetes medications (pills, injectables and/or insulin) work in my body. 2  3. I know when to check my blood sugar if I want to see how my body responded to a meal. 6  4. I know when to check my blood sugars to determine if my medication or insulin doses are correct. 6  5. I know what to do to prevent a low blood sugar when I exercise (either before, during, or after). 2  6. When I am sick, I know what to do differently with my diabetes management. 2  7. I know how stress can affect my diabetes management. 2  8. When I look at my blood sugars over a given week, I can explain what my blood sugar pattern is. 2  9. I know what my target levels are for A1c, blood pressure and cholesterol. 2  Confidence Questions  1. I am confident that I can plan balanced meals and snacks. 3  2. I am confident that I can manage my stress. 2  3. I am confident that I can prevent a low blood sugar during or after exercise. 2  4. I am confident that the next time I eat out, I will be able to choose foods that best keep my blood sugars in target. 2  5. I am confident I can include exercise into my schedule. 4  6. I am confident that I can use my daily blood sugars to adjust my diet, my activity, and/or my insulin. 2  7. When something out of my normal routine happens, I am confident that I can  problem-solve and keep my diabetes on track. 2  Preparedness Questions  1. Within the next month, I will begin to eat more balanced meals and snacks. 6  2. Within the next month, I will choose an exercise activity and I will start fitting it into my schedule. 5  3. Within the next month, I will make a list of stress management options that work for me. 5  4. Within the next month, I will consistently plan ahead to prevent low blood sugars. 5  5. Within the next month, I will start adjusting my insulin doses on my own. 0  6. Within the next month, I will begin making changes to my diabetes management based on my daily blood sugars (eg - eating, activity and/or insulin). 5  7. Within the next month, I will begin making changes to my diabetes management to meet my overall goals (eg - eating, activity and/or insulin). 5

## 2019-12-10 ENCOUNTER — Encounter: Attending: Registered" | Primary: Family Medicine

## 2019-12-13 ENCOUNTER — Ambulatory Visit: Payer: PRIVATE HEALTH INSURANCE | Attending: Registered" | Primary: Family Medicine

## 2019-12-13 ENCOUNTER — Encounter: Attending: Neurology | Primary: Family Medicine

## 2019-12-27 ENCOUNTER — Telehealth: Attending: Adult Health | Primary: Family Medicine

## 2019-12-27 ENCOUNTER — Telehealth: Admit: 2019-12-27 | Payer: PRIVATE HEALTH INSURANCE | Attending: Registered" | Primary: Family Medicine

## 2019-12-27 DIAGNOSIS — E119 Type 2 diabetes mellitus without complications: Secondary | ICD-10-CM

## 2019-12-27 NOTE — Progress Notes (Signed)
 [facility]  Diabetes Self-Management Education & Support Program  Encounter note    SUMMARY  Diabetes self-care management training was completed related to Reducing risks. The participant will return on April 19 to continue DSMES related to Reducing risks. The participant did not identify SMART Goal(s).    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  She states that she feels that her husband and daughter constantly police her food choices.  Discussed the importance of balanced eating with diabetes and why/how trend diets do not work with her conditions (diabetes and gluten-free). Encouraged patient to explore the things that make her happy like her business as well as not feel guilty being diagnosed with diabetes since she can not change her genetic makeup. Also discussed ways to discuss concerns with her family.     RECOMMENDATIONS:  Determine SMART goal for next week.     Next provider visit is scheduled for 1 week       DATE DSMES TOPIC EVALUATION   12/27/2019 WHAT IS DIABETES?   a. Role of the normal pancreas in energy balance and blood glucose control   b. The defect seen in diabetes   c. Signs & symptoms of diabetes   d. Diagnosis of diabetes   e. Types of diabetes   f. Blood glucose targets in non-pregnant & non-pregnant adults       The participant knows  . Their type of diabetes Yes  . The basic physiologic defect Yes  . Blood glucose targets Yes   Arvin LELON Gins, RD on 12/28/2019 at 1:58 PM    COVID-19 Public Health Emergency Adaptations for Telehealth:    JAZMAN REUTER is a 68 y.o. female being evaluated through a synchronous (real-time) audio-video technology platform, as a substitution for an in-person encounter, to address the healthcare issues mentioned above. I was in the office. The patient was at home. A caregiver was present when appropriate. The patient and/or her healthcare  decision maker, is aware that this patient-initiated, Telehealth encounter on 12/27/2019 is a billable service, with coverage as determined by her insurance carrier. She is aware that she may receive a bill and has provided verbal consent to proceed: Yes.     This telehealth encounter occurred during the COVID-19 pandemic and public health emergency. Evaluation of the following organ systems was limited: Vitals/Constitutional/EENT/Resp/CV/GI/GU/MS/Neuro/Skin/Heme-Lymph-Imm.  Pursuant to the emergency declaration under the [facility] Act and the [employer], 1135 waiver authority and the [employer] Act, this Virtual Visit was conducted with patient's (and/or legal guardian's) consent, to reduce the risk of exposure to COVID-19 and provide necessary medical care.     Time in appointment: 60 minutes

## 2019-12-27 NOTE — Progress Notes (Signed)
[place] [facility]  Diabetes Self-Management Education & Support Program  Encounter note    SUMMARY  Diabetes self-care management training was completed related to Reducing risks. The participant will return on April 19 to continue DSMES related to Reducing risks. The participant did not identify SMART Goal(s).    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  She states that she feels that her husband and daughter constantly police her food choices.  Discussed the importance of balanced eating with diabetes and why/how trend diets do not work with her conditions (diabetes and gluten-free). Encouraged patient to explore the things that make her happy like her business as well as not feel guilty being diagnosed with diabetes since she can not change her genetic makeup. Also discussed ways to discuss concerns with her family.     RECOMMENDATIONS:  Determine SMART goal for next week.     Next provider visit is scheduled for 1 week       DATE DSMES TOPIC EVALUATION   12/27/2019 WHAT IS DIABETES?   a. Role of the normal pancreas in energy balance and blood glucose control   b. The defect seen in diabetes   c. Signs & symptoms of diabetes   d. Diagnosis of diabetes   e. Types of diabetes   f. Blood glucose targets in non-pregnant & non-pregnant adults       The participant knows  ??? Their type of diabetes Yes  ??? The basic physiologic defect Yes  ??? Blood glucose targets Yes   Jazzlene Huot W Cyntha Brickman, RD on 12/28/2019 at 1:58 PM    COVID-19 Public Health Emergency Adaptations for Telehealth:    Leyna M Hermida is a 68 y.o. female being evaluated through a synchronous (real-time) audio-video technology platform, as a substitution for an in-person encounter, to address the healthcare issues mentioned above. I was in the office. The patient was at home. A caregiver was present when appropriate. The patient and/or her healthcare  decision maker, is aware that this patient-initiated, Telehealth encounter on 12/27/2019 is a billable service, with coverage as determined by her insurance carrier. She is aware that she may receive a bill and has provided verbal consent to proceed: Yes.     This telehealth encounter occurred during the COVID-19 pandemic and public health emergency. Evaluation of the following organ systems was limited: Vitals/Constitutional/EENT/Resp/CV/GI/GU/MS/Neuro/Skin/Heme-Lymph-Imm.  Pursuant to the emergency declaration under the [place] Act and the National Emergencies Act, 1135 waiver authority and the Coronavirus Preparedness and Response Supplemental Appropriations Act, this Virtual Visit was conducted with patient's (and/or legal guardian's) consent, to reduce the risk of exposure to COVID-19 and provide necessary medical care.     Time in appointment: 60 minutes

## 2020-01-03 ENCOUNTER — Telehealth: Attending: Internal Medicine | Primary: Family Medicine

## 2020-01-03 ENCOUNTER — Ambulatory Visit: Payer: PRIVATE HEALTH INSURANCE | Attending: Registered" | Primary: Family Medicine

## 2020-01-03 ENCOUNTER — Telehealth: Admit: 2020-01-03 | Payer: PRIVATE HEALTH INSURANCE | Attending: Internal Medicine | Primary: Family Medicine

## 2020-01-03 DIAGNOSIS — E1165 Type 2 diabetes mellitus with hyperglycemia: Secondary | ICD-10-CM

## 2020-01-03 MED ORDER — SEMAGLUTIDE 0.25 MG OR 0.5 MG (2 MG/1.5 ML) SUBCUTANEOUS PEN INJECTOR
0.25 mg or 0.5 mg(2 mg/1.5 mL) | SUBCUTANEOUS | 3 refills | Status: DC
Start: 2020-01-03 — End: 2020-01-10

## 2020-01-03 NOTE — Progress Notes (Signed)
This is a New Pt visit conducted via telemedicine using doxy.me video.  The patient has been instructed that this meets HIPAA criteria ,that they may receive a bill for these services and acknowledges and agrees to this method of visitation.    This is a 69 year old white female with a history of type 2 diabetes mellitus x3 years referred for evaluation and management.  She has a strong family history of diabetes.  Her father and paternal grandfather either have or had diabetes.  She was found to have blood diabetes on blood work.  She says her most recent A1c was 7% but that was in January 2020.  She has been on a number of different medications.  She was placed on Metformin but did not tolerate it.  In part this is because of her celiac disease.  In any case she has been on by durian for the last 2 years but thinks it does not do anything.    Past medical history  1.  Celiac disease currently on a gluten-free diet  2.  History of Barrett's esophagus  3.  History of asthma with occasional steroids but none in the last 5 months  4.  Migraines  5.  vertigo  6.  Osteoporosis on Prolia and infusions  7 obstructive sleep apnea on CPAP    Current Diabetes Medication  Bydureon 2 mg weekly    She wakes up in the morning with blood sugars ranging between 200-220.  Her first meal the day is an egg omelette with spinach and perhaps a slice of gluten-free toast.  Her second meal is a dinner meal.  Her blood sugar before that meal ranges between 240 and 300.  Dinner can be a meat and greens or crab cakes and salad.  Last night she had gluten-free spaghetti.  Physical activity is limited because of her asthma and her degenerative joint disease.    Review of Systems - General ROS: negative  Psychological ROS: negative  Ophthalmic ROS: positive for - decreased vision  ENT ROS: negative  Respiratory ROS: positive for - shortness of breath  Cardiovascular ROS: no chest pain or dyspnea on exertion  Gastrointestinal ROS: no abdominal  pain, change in bowel habits, or black or bloody stools  Genito-Urinary ROS: positive for - urinary frequency/urgency and vulvar/vaginal symptoms  Musculoskeletal ROS: negative  positive for - joint pain  Neurological ROS: no TIA or stroke symptoms  Dermatological ROS: negative    GENERAL: NCAT, Appears well nourished   EYES: EOMI, non-icteric, no proptosis   Ear/Nose/Throat: NCAT, no visible inflammation or masses   CARDIOVASCULAR: no cyanosis, no visible JVD   RESPIRATORY: comfortable respirations observed, no cyanosis   MUSCULOSKELETAL: Normal ROM of upper extremities observed   SKIN: No edema, rash, or other significant changes observed   NEUROLOGIC:  AAOx3   PSYCHIATRIC: Normal affect, Normal insight and judgement       Impression  1.  Type 2 diabetes mellitus with poor blood sugar control with the most recent A1c available of 7% in January 2020  2.  Adverse reaction to Bydureon with questionable efficacy based on blood sugar readings  3.  Obesity    Plan:  1.  I have asked her to have her primary care physician forward me most recent blood work.  The blood sugars and what she claims to be a recent A1c of 7% are not consistent.  2.  I have asked her to record her blood sugars before breakfast before dinner and  at bedtime for 1 week and to email me the results  3.  I have sent a prescription for Ozempic which hopefully will be covered and lieu of the Bydureon which does not appear to be working  4.  I will see her back in 1 month    ADDENDUM:  Labs dated February 2021 include the following:  A1c 6.5%  Random glucose 81  Creatinine 0.88  LDL 97

## 2020-01-03 NOTE — Progress Notes (Addendum)
This is a New Pt visit conducted via telemedicine using doxy.me video.  The patient has been instructed that this meets HIPAA criteria ,that they may receive a bill for these services and acknowledges and agrees to this method of visitation.    This is a 69 year old white female with a history of type 2 diabetes mellitus x3 years referred for evaluation and management.  She has a strong family history of diabetes.  Her father and paternal grandfather either have or had diabetes.  She was found to have blood diabetes on blood work.  She says her most recent A1c was 7% but that was in January 2020.  She has been on a number of different medications.  She was placed on Metformin but did not tolerate it.  In part this is because of her celiac disease.  In any case she has been on by durian for the last 2 years but thinks it does not do anything.    Past medical history  1.  Celiac disease currently on a gluten-free diet  2.  History of Barrett's esophagus  3.  History of asthma with occasional steroids but none in the last 5 months  4.  Migraines  5.  vertigo  6.  Osteoporosis on Prolia and infusions  7 obstructive sleep apnea on CPAP    Current Diabetes Medication  Bydureon 2 mg weekly    She wakes up in the morning with blood sugars ranging between 200-220.  Her first meal the day is an egg omelette with spinach and perhaps a slice of gluten-free toast.  Her second meal is a dinner meal.  Her blood sugar before that meal ranges between 240 and 300.  Dinner can be a meat and greens or crab cakes and salad.  Last night she had gluten-free spaghetti.  Physical activity is limited because of her asthma and her degenerative joint disease.    Review of Systems - General ROS: negative  Psychological ROS: negative  Ophthalmic ROS: positive for - decreased vision  ENT ROS: negative  Respiratory ROS: positive for - shortness of breath  Cardiovascular ROS: no chest pain or dyspnea on exertion  Gastrointestinal ROS: no abdominal  pain, change in bowel habits, or black or bloody stools  Genito-Urinary ROS: positive for - urinary frequency/urgency and vulvar/vaginal symptoms  Musculoskeletal ROS: negative  positive for - joint pain  Neurological ROS: no TIA or stroke symptoms  Dermatological ROS: negative    GENERAL: NCAT, Appears well nourished   EYES: EOMI, non-icteric, no proptosis   Ear/Nose/Throat: NCAT, no visible inflammation or masses   CARDIOVASCULAR: no cyanosis, no visible JVD   RESPIRATORY: comfortable respirations observed, no cyanosis   MUSCULOSKELETAL: Normal ROM of upper extremities observed   SKIN: No edema, rash, or other significant changes observed   NEUROLOGIC:  AAOx3   PSYCHIATRIC: Normal affect, Normal insight and judgement       Impression  1.  Type 2 diabetes mellitus with poor blood sugar control with the most recent A1c available of 7% in January 2020  2.  Adverse reaction to Bydureon with questionable efficacy based on blood sugar readings  3.  Obesity    Plan:  1.  I have asked her to have her primary care physician forward me most recent blood work.  The blood sugars and what she claims to be a recent A1c of 7% are not consistent.  2.  I have asked her to record her blood sugars before breakfast before dinner and  at bedtime for 1 week and to email me the results  3.  I have sent a prescription for Ozempic which hopefully will be covered and lieu of the Bydureon which does not appear to be working  4.  I will see her back in 1 month    ADDENDUM:  Labs dated February 2021 include the following:  A1c 6.5%  Random glucose 81  Creatinine 0.88  LDL 97

## 2020-01-10 ENCOUNTER — Encounter: Attending: Registered" | Primary: Family Medicine

## 2020-01-10 ENCOUNTER — Encounter

## 2020-01-11 NOTE — Telephone Encounter (Signed)
-----   Message from Garlan Fillers Fludovich sent at 01/11/2020  9:00 AM EDT -----  Regarding: Dr. Lottie Dawson telephone  General Message/Vendor Calls    Caller's first and last name: Bonita Quin, [employer]      Reason for call: medication questions      Callback required yes/no and why: yes       Best contact number(s):[phone]      Details to clarify the request:  She stated patient was prescribed Bydureon on 12/30/2019 by Dr. Zonia Kief and Dr. Lottie Dawson prescribed patient Ozempic on 01/03/2020. She would like to be contacted to see if Dr. Lottie Dawson was aware of the Bydureon being prescribed and stated the reference number is [id number].    Garlan Fillers Fludovich

## 2020-01-11 NOTE — Telephone Encounter (Signed)
Spoke to [employer] with [employer] and informed her that the patient should only be taking the Ozempic medication. IJ informed me that she will discontinue the Bydureon prescription and activate the Ozempic prescription.    Spoke with the patient and informed her of this information. Informed her that if a PA is needed for the Ozempic, then I will initiate one.

## 2020-01-11 NOTE — Telephone Encounter (Signed)
Aimovig renewal sent to [employer] via CMM w/ notes.     QGBEEF:[id number];JOITGP:[id number];Review Type:Prior Auth;Coverage Start Date:12/12/2019;Coverage End Date:01/10/2021;    Faxed to [employer] home delivery    Mess sent to pt:     Ms. Croker,     Your Dennie Fetters has been renewed for one year to 01/09/21.  I have faxed the approval to [employer] home delivery.     Thank you,   Massie Bougie

## 2020-01-12 ENCOUNTER — Inpatient Hospital Stay: Payer: PRIVATE HEALTH INSURANCE | Primary: Family Medicine

## 2020-01-12 ENCOUNTER — Encounter

## 2020-01-12 DIAGNOSIS — Z1231 Encounter for screening mammogram for malignant neoplasm of breast: Secondary | ICD-10-CM

## 2020-01-12 MED ORDER — AIMOVIG AUTOINJECTOR 140 MG/ML SUBCUTANEOUS AUTO-INJECTOR
140 mg/mL | SUBCUTANEOUS | 3 refills | Status: DC
Start: 2020-01-12 — End: 2020-08-01

## 2020-01-13 MED ORDER — SEMAGLUTIDE 0.25 MG OR 0.5 MG (2 MG/1.5 ML) SUBCUTANEOUS PEN INJECTOR
0.25 mg or 0.5 mg(2 mg/1.5 mL) | SUBCUTANEOUS | 3 refills | Status: DC
Start: 2020-01-13 — End: 2020-02-11

## 2020-01-17 ENCOUNTER — Ambulatory Visit: Payer: PRIVATE HEALTH INSURANCE | Attending: Registered" | Primary: Family Medicine

## 2020-01-18 ENCOUNTER — Ambulatory Visit: Attending: Neurology | Primary: Family Medicine

## 2020-01-18 ENCOUNTER — Ambulatory Visit: Admit: 2020-01-18 | Payer: PRIVATE HEALTH INSURANCE | Attending: Neurology | Primary: Family Medicine

## 2020-01-18 DIAGNOSIS — E1142 Type 2 diabetes mellitus with diabetic polyneuropathy: Secondary | ICD-10-CM

## 2020-01-18 MED ORDER — DULOXETINE 60 MG CAP, DELAYED RELEASE
60 mg | ORAL_CAPSULE | Freq: Every day | ORAL | 2 refills | Status: DC
Start: 2020-01-18 — End: 2020-03-30

## 2020-01-18 MED ORDER — LIDOCAINE 5 % TOPICAL OINTMENT
5 % | CUTANEOUS | 5 refills | Status: DC
Start: 2020-01-18 — End: 2020-05-15

## 2020-01-18 MED ORDER — ZOLMITRIPTAN 5 MG TAB, RAPID DISSOLVE
5 mg | ORAL_TABLET | ORAL | 5 refills | Status: DC | PRN
Start: 2020-01-18 — End: 2020-07-25

## 2020-01-18 NOTE — Progress Notes (Signed)
Chief Complaint   Patient presents with   . Follow-up   . Peripheral Neuropathy     waking patient up at night, last couple of days her toes have been hurting     Has a bruise on both feet but left foot is worse.     Wants to find PCP within [place].

## 2020-01-18 NOTE — Progress Notes (Signed)
Neurology Note    Chief Complaint   Patient presents with   ??? Follow-up   ??? Peripheral Neuropathy     waking patient up at night, last couple of days her toes have been hurting       HPI/Subjective  Kayla Shaffer is a 69 y.o. female who presented to the neurology office for management of migraines.     She has had migraines for a long time. It started around the age of 75. She does have headaches on the left side of the head. It is sharp, throbbing and aching in character. It is around 10/10 in severity. There is associated photophobia, phonophobia, nausea.  If she takes zomig, it lasts for around 12 hrs. she does have significant drowsiness on Topamax.  She is presently taking 50 mg in the morning and 100 mg at night.    She moved to [facility] from [place] around 2 yrs ago. 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     The patient is also complaining of poor balance and numbness in the legs.    Interval history:  Headache is better  She is complaining of burning sensation in her feet.  It wakes her up at night.  She takes Cymbalta 20 mg daily.    Baseline headache frequency: 12/month  Headache frequency now:  1/week    Risk Factors for headaches  Smoking: denies  Coffee: 1 cups/day  Tea: 3-4 cups/day  Soda: 2/week  Water: 8-10 glasses/day  Sleeps at 9 pm in her couch and wakes up at 11 pm and then she goes to her bed and she does wake up at 9 am. She does go to bed again around 1 pm and sleeps for 2-3 hrs.. She does snore.    Medications tried  Preventative therapy:  Topamax  Cymbalta  Depakote  Botox    Abortive therapy:  Zomig     Current Outpatient Medications   Medication Sig   ??? famotidine (PEPCID) 40 mg tablet Take 40 mg by mouth daily.   ??? RABEprazole (ACIPHEX) 20 mg TbEC Take 20 mg by mouth daily.   ??? semaglutide (OZEMPIC) 0.25 mg/0.2 mL (2 mg/1.5 mL) sub-q pen 0.25 mg weekly for 4 weeks and then increase to 0.5 mg weekly   ??? erenumab-aooe (Aimovig Autoinjector) 140 mg/mL  injection 1 mL by SubCUTAneous route every thirty (30) days.   ??? atorvastatin (LIPITOR) 40 mg tablet    ??? cpap machine kit by Does Not Apply route.   ??? buPROPion XL (WELLBUTRIN XL) 300 mg XL tablet Take 300 mg by mouth every morning.   ??? estradiol (VAGIFEM) 10 mcg tab vaginal tablet Insert 10 mcg into vagina every seven (7) days.   ??? denosumab (PROLIA) 60 mg/mL injection 60 mg by SubCUTAneous route every 6 months.   ??? ZOLMitriptan (ZOMIG ZMT) 5 mg disintegrating tablet Take 5 mg by mouth as needed for Migraine.   ??? fluticasone (FLOVENT HFA) 110 mcg/actuation inhaler Take 2 Puffs by inhalation every twelve (12) hours.     No current facility-administered medications for this visit.      Allergies   Allergen Reactions   ??? Ceftin [Cefuroxime Axetil] Anaphylaxis     Throat almost closed off   ??? Bactrim [Sulfamethoprim] Swelling     Throat swelling   ??? Codeine Swelling     throat   ??? Sulfa (Sulfonamide Antibiotics) Swelling     Swollen throat  Past Medical History:   Diagnosis Date   ??? Asthma     last flare up over a year ago as stated 09/02/2017   ??? Barrett esophagus    ??? BPPV (benign paroxysmal positional vertigo), right    ??? Celiac disease    ??? Diabetes (HCC)     no medications, just watching   ??? Heart murmur    ??? Hiatal hernia     sliding   ??? Migraine    ??? Osteoarthritis    ??? Osteoporosis    ??? Sleep apnea      Past Surgical History:   Procedure Laterality Date   ??? COLONOSCOPY N/A 09/18/2017    COLONOSCOPY/EGD performed by [name], [place] at [facility]   ??? HX APPENDECTOMY     ??? HX BREAST BIOPSY Left     Stereotactic biopsy. Benign. Long time ago. Clip.   ??? HX CARPAL TUNNEL RELEASE Bilateral    ??? HX CATARACT REMOVAL Bilateral 2016   ??? HX COLONOSCOPY     ??? HX ENDOSCOPY     ??? HX HYSTERECTOMY      a long time ago   ??? HX INTRACRANIAL ANEURYSM REPAIR Right 10/2015    behind right eye, clip, done at [facility], safe for MRI   ??? HX OPEN REDUCTION INTERNAL FIXATION Right 1993    no hardware, wrist   ??? HX OPEN  REDUCTION INTERNAL FIXATION Left 2014    wrist, no hardware     Family History   Problem Relation Age of Onset   ??? Breast Cancer Paternal Grandmother         onset: 50s   ??? Dementia Mother    ??? Headache Mother    ??? Heart Disease Father    ??? Parkinsonism Father    ??? Stroke Father      Social History     Tobacco Use   ??? Smoking status: Former Smoker   ??? Smokeless tobacco: Never Used   Substance Use Topics   ??? Alcohol use: Not Currently     Comment: socially, mixed drink   ??? Drug use: No       REVIEW OF SYSTEMS:   A ten system review of constitutional, cardiovascular, respiratory, musculoskeletal, endocrine, skin, SHEENT, genitourinary, psychiatric and neurologic systems was obtained and is unremarkable with the exception of anxiety, chest pain, constipation, cough, depression, diarrhea, falls, fatigue, frequent headache, joint pain, memory loss, muscle pain, muscle weakness, nausea, rash, ringing in the ear, snoring, stomach pain, vertigo and visual disturbance    EXAMINATION:   Visit Vitals  BP (!) 146/88 (BP 1 Location: Left arm, BP Patient Position: Sitting, BP Cuff Size: Adult)   Pulse 93   Resp 16   Wt 205 lb (93 kg)   SpO2 98%   BMI 37.49 kg/m??        General:   General appearance: Pt is in no acute distress   Distal pulses are preserved  Fundoscopic Exam: Normal    Neurological Examination:   Mental Status: AAO x3. Speech is fluent. Follows commands, has normal fund of knowledge, attention, short term recall, comprehension and insight.     Cranial Nerves: Visual fields are full. PERRL, Extraocular movements are full. Facial sensation intact. Facial movement intact. Hearing intact to conversation. Palate elevates symmetrically. Shoulder shrug symmetric. Tongue midline.     Motor: Strength is 5/5 in all 4 ext. No atrophy.     Tone: Normal    Sensation: Light  touch - Normal    Reflexes: DTRs 2+ throughout.      Coordination/Cerebellar: Intact to finger-nose-finger     Gait: Casual gait is normal.     Skin: No  significant bruising or lacerations.    Laboratory review:   Results for orders placed or performed during the hospital encounter of 01/07/19   EKG, 12 LEAD, INITIAL   Result Value Ref Range    Ventricular Rate 101 BPM    Atrial Rate 101 BPM    P-R Interval 154 ms    QRS Duration 72 ms    Q-T Interval 344 ms    QTC Calculation (Bezet) 446 ms    Calculated P Axis 70 degrees    Calculated R Axis 11 degrees    Calculated T Axis 91 degrees    Diagnosis       Sinus tachycardia  Right atrial enlargement  Abnormal QRS-T angle, consider primary T wave abnormality  When compared with ECG of 26-Apr-2018 12:13,  No significant change was found  Confirmed by Ravindra, P.V. ([id number]) on 01/08/2019 4:42:44 PM         Imaging review:  01/29/2019  Sleep study  Positive home sleep test for sleep apnea.  Recommend positive airway pressure trial    10/12/2018  EMG/nerve conduction study  Normal    Documentation review:  None    Assessment/Plan:   1.  Diabetic peripheral neuropathy  The patient does have poor balance and on examination has decreased pinprick sensation distally in the lower extremities.  EMG/NCS was normal.  I do suspect that she does have small fiber neuropathy.  She is taking Cymbalta 20 mg daily.  I am going to increase it to 60 mg daily.  I am also going to prescribe lidocaine 5 mg ointment.  If this did not work, will start gabapentin.    2. Intractable chronic migraine without aura and without status migrainosus  The patient does have chronic intractable migraines and has tried Botox, Depakote, Cymbalta and Topamax.      - She is on aimovig and will continue the same. She is taking zomig as needed.    3. Hx of cerebral aneurysm repair  She did have aneurysm clipping in February 2017.  Doing well.    4. OSA  She was recently diagnosed with OSA and using CPAP    Follow-up in 6 months.    3 most recent PHQ Screens 01/18/2020   Little interest or pleasure in doing things Not at all   Feeling down, depressed, irritable, or  hopeless Not at all   Total Score PHQ 2 0     Primary care to address possible depression if PHQ-9 score is more than 9.      ICD-10-CM ICD-9-CM    1. Diabetic polyneuropathy associated with type 2 diabetes mellitus (HCC)  E11.42 250.60      357.2    2. Hx of cerebral aneurysm repair  Z98.890 V45.89     Z86.79     3. OSA (obstructive sleep apnea)  G47.33 327.23    4. Intractable chronic migraine without aura and without status migrainosus  G43.719 346.71       Thank you for allowing me to participate in the care of Ms. Blasingame. Please feel free to contact me if you have any questions.    Electronically signed.   Baldemar Friday, MD  Neurologist    CC: Joycie Peek, MD  Fax: [phone]    This note was created using  voice recognition software. Despite editing, there may be syntax errors.

## 2020-01-18 NOTE — Progress Notes (Signed)
Neurology Note    Chief Complaint   Patient presents with   ??? Follow-up   ??? Peripheral Neuropathy     waking patient up at night, last couple of days her toes have been hurting       HPI/Subjective  Kayla Shaffer is a 69 y.o. female who presented to the neurology office for management of migraines.     She has had migraines for a long time. It started around the age of 69. She does have headaches on the left side of the head. It is sharp, throbbing and aching in character. It is around 10/10 in severity. There is associated photophobia, phonophobia, nausea.  If she takes zomig, it lasts for around 12 hrs. she does have significant drowsiness on Topamax.  She is presently taking 50 mg in the morning and 100 mg at night.    She moved to [place] from [place] around 2 yrs ago. 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     The patient is also complaining of poor balance and numbness in the legs.    Interval history:  Headache is better  She is complaining of burning sensation in her feet.  It wakes her up at night.  She takes Cymbalta 20 mg daily.    Baseline headache frequency: 12/month  Headache frequency now:  1/week    Risk Factors for headaches  Smoking: denies  Coffee: 1 cups/day  Tea: 3-4 cups/day  Soda: 2/week  Water: 8-10 glasses/day  Sleeps at 9 pm in her couch and wakes up at 11 pm and then she goes to her bed and she does wake up at 9 am. She does go to bed again around 1 pm and sleeps for 2-3 hrs.. She does snore.    Medications tried  Preventative therapy:  Topamax  Cymbalta  Depakote  Botox    Abortive therapy:  Zomig     Current Outpatient Medications   Medication Sig   ??? famotidine (PEPCID) 40 mg tablet Take 40 mg by mouth daily.   ??? RABEprazole (ACIPHEX) 20 mg TbEC Take 20 mg by mouth daily.   ??? semaglutide (OZEMPIC) 0.25 mg/0.2 mL (2 mg/1.5 mL) sub-q pen 0.25 mg weekly for 4 weeks and then increase to 0.5 mg weekly   ??? erenumab-aooe (Aimovig Autoinjector) 140 mg/mL  injection 1 mL by SubCUTAneous route every thirty (30) days.   ??? atorvastatin (LIPITOR) 40 mg tablet    ??? cpap machine kit by Does Not Apply route.   ??? buPROPion XL (WELLBUTRIN XL) 300 mg XL tablet Take 300 mg by mouth every morning.   ??? estradiol (VAGIFEM) 10 mcg tab vaginal tablet Insert 10 mcg into vagina every seven (7) days.   ??? denosumab (PROLIA) 60 mg/mL injection 60 mg by SubCUTAneous route every 6 months.   ??? ZOLMitriptan (ZOMIG ZMT) 5 mg disintegrating tablet Take 5 mg by mouth as needed for Migraine.   ??? fluticasone (FLOVENT HFA) 110 mcg/actuation inhaler Take 2 Puffs by inhalation every twelve (12) hours.     No current facility-administered medications for this visit.      Allergies   Allergen Reactions   ??? Ceftin [Cefuroxime Axetil] Anaphylaxis     Throat almost closed off   ??? Bactrim [Sulfamethoprim] Swelling     Throat swelling   ??? Codeine Swelling     throat   ??? Sulfa (Sulfonamide Antibiotics) Swelling     Swollen throat       Past Medical History:   Diagnosis Date   ??? Asthma     last flare up over a year ago as stated 09/02/2017   ??? Barrett esophagus    ??? BPPV (benign paroxysmal positional vertigo), right    ??? Celiac disease    ??? Diabetes (HCC)     no medications, just watching   ??? Heart murmur    ??? Hiatal hernia     sliding   ??? Migraine    ??? Osteoarthritis    ??? Osteoporosis    ??? Sleep apnea      Past Surgical History:   Procedure Laterality Date   ??? COLONOSCOPY N/A 09/18/2017    COLONOSCOPY/EGD performed by Manetas, Michael S, [place] at [facility]   ??? HX APPENDECTOMY     ??? HX BREAST BIOPSY Left     Stereotactic biopsy. Benign. Long time ago. Clip.   ??? HX CARPAL TUNNEL RELEASE Bilateral    ??? HX CATARACT REMOVAL Bilateral 2016   ??? HX COLONOSCOPY     ??? HX ENDOSCOPY     ??? HX HYSTERECTOMY      a long time ago   ??? HX INTRACRANIAL ANEURYSM REPAIR Right 10/2015    behind right eye, clip, done at [facility], safe for MRI   ??? HX OPEN REDUCTION INTERNAL FIXATION Right 1993    no hardware, wrist   ??? HX OPEN  REDUCTION INTERNAL FIXATION Left 2014    wrist, no hardware     Family History   Problem Relation Age of Onset   ??? Breast Cancer Paternal Grandmother         onset: 70s   ??? Dementia Mother    ??? Headache Mother    ??? Heart Disease Father    ??? Parkinsonism Father    ??? Stroke Father      Social History     Tobacco Use   ??? Smoking status: Former Smoker   ??? Smokeless tobacco: Never Used   Substance Use Topics   ??? Alcohol use: Not Currently     Comment: socially, mixed drink   ??? Drug use: No       REVIEW OF SYSTEMS:   A ten system review of constitutional, cardiovascular, respiratory, musculoskeletal, endocrine, skin, SHEENT, genitourinary, psychiatric and neurologic systems was obtained and is unremarkable with the exception of anxiety, chest pain, constipation, cough, depression, diarrhea, falls, fatigue, frequent headache, joint pain, memory loss, muscle pain, muscle weakness, nausea, rash, ringing in the ear, snoring, stomach pain, vertigo and visual disturbance    EXAMINATION:   Visit Vitals  BP (!) 146/88 (BP 1 Location: Left arm, BP Patient Position: Sitting, BP Cuff Size: Adult)   Pulse 93   Resp 16   Wt 205 lb (93 kg)   SpO2 98%   BMI 37.49 kg/m??        General:   General appearance: Pt is in no acute distress   Distal pulses are preserved  Fundoscopic Exam: Normal    Neurological Examination:   Mental Status: AAO x3. Speech is fluent. Follows commands, has normal fund of knowledge, attention, short term recall, comprehension and insight.     Cranial Nerves: Visual fields are full. PERRL, Extraocular movements are full. Facial sensation intact. Facial movement intact. Hearing intact to conversation. Palate elevates symmetrically. Shoulder shrug symmetric. Tongue midline.     Motor: Strength is 5/5 in all 4 ext. No atrophy.     Tone: Normal    Sensation: Light   touch - Normal    Reflexes: DTRs 2+ throughout.      Coordination/Cerebellar: Intact to finger-nose-finger     Gait: Casual gait is normal.     Skin: No  significant bruising or lacerations.    Laboratory review:   Results for orders placed or performed during the hospital encounter of 01/07/19   EKG, 12 LEAD, INITIAL   Result Value Ref Range    Ventricular Rate 101 BPM    Atrial Rate 101 BPM    P-R Interval 154 ms    QRS Duration 72 ms    Q-T Interval 344 ms    QTC Calculation (Bezet) 446 ms    Calculated P Axis 70 degrees    Calculated R Axis 11 degrees    Calculated T Axis 91 degrees    Diagnosis       Sinus tachycardia  Right atrial enlargement  Abnormal QRS-T angle, consider primary T wave abnormality  When compared with ECG of 26-Apr-2018 12:13,  No significant change was found  Confirmed by Ravindra, P.V. ([id number]) on 01/08/2019 4:42:44 PM         Imaging review:  01/29/2019  Sleep study  Positive home sleep test for sleep apnea.  Recommend positive airway pressure trial    10/12/2018  EMG/nerve conduction study  Normal    Documentation review:  None    Assessment/Plan:   1.  Diabetic peripheral neuropathy  The patient does have poor balance and on examination has decreased pinprick sensation distally in the lower extremities.  EMG/NCS was normal.  I do suspect that she does have small fiber neuropathy.  She is taking Cymbalta 20 mg daily.  I am going to increase it to 60 mg daily.  I am also going to prescribe lidocaine 5 mg ointment.  If this did not work, will start gabapentin.    2. Intractable chronic migraine without aura and without status migrainosus  The patient does have chronic intractable migraines and has tried Botox, Depakote, Cymbalta and Topamax.      - She is on aimovig and will continue the same. She is taking zomig as needed.    3. Hx of cerebral aneurysm repair  She did have aneurysm clipping in February 2017.  Doing well.    4. OSA  She was recently diagnosed with OSA and using CPAP    Follow-up in 6 months.    3 most recent PHQ Screens 01/18/2020   Little interest or pleasure in doing things Not at all   Feeling down, depressed, irritable, or  hopeless Not at all   Total Score PHQ 2 0     Primary care to address possible depression if PHQ-9 score is more than 9.      ICD-10-CM ICD-9-CM    1. Diabetic polyneuropathy associated with type 2 diabetes mellitus (HCC)  E11.42 250.60      357.2    2. Hx of cerebral aneurysm repair  Z98.890 V45.89     Z86.79     3. OSA (obstructive sleep apnea)  G47.33 327.23    4. Intractable chronic migraine without aura and without status migrainosus  G43.719 346.71       Thank you for allowing me to participate in the care of Ms. Derwin. Please feel free to contact me if you have any questions.    Electronically signed.   Larken Urias, MD  Neurologist    CC: Stephens, Gregory, MD  Fax: [phone]    This note was created using   voice recognition software. Despite editing, there may be syntax errors.

## 2020-01-18 NOTE — Progress Notes (Signed)
Chief Complaint   Patient presents with   ??? Follow-up   ??? Peripheral Neuropathy     waking patient up at night, last couple of days her toes have been hurting     Has a bruise on both feet but left foot is worse.     Wants to find PCP within .

## 2020-02-02 ENCOUNTER — Inpatient Hospital Stay: Admit: 2020-02-02 | Payer: PRIVATE HEALTH INSURANCE | Primary: Family Medicine

## 2020-02-02 DIAGNOSIS — Z1231 Encounter for screening mammogram for malignant neoplasm of breast: Secondary | ICD-10-CM

## 2020-02-11 ENCOUNTER — Ambulatory Visit: Attending: Internal Medicine | Primary: Family Medicine

## 2020-02-11 ENCOUNTER — Ambulatory Visit: Admit: 2020-02-11 | Payer: PRIVATE HEALTH INSURANCE | Attending: Internal Medicine | Primary: Family Medicine

## 2020-02-11 DIAGNOSIS — E1165 Type 2 diabetes mellitus with hyperglycemia: Secondary | ICD-10-CM

## 2020-02-11 MED ORDER — SEMAGLUTIDE 0.25 MG OR 0.5 MG (2 MG/1.5 ML) SUBCUTANEOUS PEN INJECTOR
0.25 mg or 0.5 mg(2 mg/1.5 mL) | SUBCUTANEOUS | 3 refills | Status: DC
Start: 2020-02-11 — End: 2021-01-15

## 2020-02-11 NOTE — Progress Notes (Signed)
This is a 69 year old white female with a history of type 2 diabetes mellitus x3 years referred for evaluation and management.  She has a strong family history of diabetes.  Her father and paternal grandfather either have or had diabetes.  She was found to have blood diabetes on blood work.  She says her most recent A1c was 7% but that was in January 2020.  She has been on a number of different medications.  She was placed on Metformin but did not tolerate it.  In part this is because of her celiac disease.  In any case she has been on Bydureon  for the last 2 years but thinks it did  not do anything.  ??  Past medical history  1.  Celiac disease currently on a gluten-free diet  2.  History of Barrett's esophagus  3.  History of asthma with occasional steroids but none in the last 5 months  4.  Migraines  5.  vertigo  6.  Osteoporosis on Prolia and infusions  7 obstructive sleep apnea on CPAP  ??  Current Diabetes Medication  Ozempic 0.5 mg weekly  ??  Saw her for the first time a month ago, she was waking up in the morning with blood sugars ranging between 200-220.  Her first meal the day is an egg omelette with spinach and perhaps a slice of gluten-free toast.  Her second meal is a dinner meal.  Her blood sugar before that meal ranges between 240 and 300.  Dinner can be a meat and greens or crab cakes and salad.  Last night she had gluten-free spaghetti.  Physical activity is limited because of her asthma and her degenerative joint disease.  After that initial visit, I started Ozempic 0.25 mg which she has been taking for a month. She only today took her first dose of 0.5 mg weekly. Even on the very low dose of Ozempic, her fasting blood sugars are now in the 130-160 range. She is very pleased. She has noted a decrease in appetite but has no adverse effects that she is concerned about.    Examination  Blood pressure 172/93  Pulse 92  Weight 207  HEENT unremarkable  Lungs clear  Heart reveals a regular rate and  rhythm  Abdomen obese  Extremities unremarkable  Diabetic foot exam:     Left Foot:   Visual Exam: normal    Pulse DP: 2+ (normal)   Filament test: absent sensation    Vibratory sensation: absent      Right Foot:   Visual Exam: normal    Pulse DP: 2+ (normal)   Filament test: absent sensation    Vibratory sensation: absent    Impression  1. Type 2 diabetes mellitus with a significant improvement in glucose on Ozempic 0.25 which was recently increased to 0.5 mg weekly.  2. Peripheral diabetic neuropathy  3. Celiac disease    Plan:  1. I did not change the regimen. We will continue with 0.5 mg of Ozempic weekly  2. I refill the prescription  3. I will see her back in 3 months and we will repeat her A1c at that time.

## 2020-02-11 NOTE — Progress Notes (Signed)
This is a 69 year old white female with a history of type 2 diabetes mellitus x3 years referred for evaluation and management.  She has a strong family history of diabetes.  Her father and paternal grandfather either have or had diabetes.  She was found to have blood diabetes on blood work.  She says her most recent A1c was 7% but that was in January 2020.  She has been on a number of different medications.  She was placed on Metformin but did not tolerate it.  In part this is because of her celiac disease.  In any case she has been on Bydureon  for the last 2 years but thinks it did  not do anything.  ??  Past medical history  1.  Celiac disease currently on a gluten-free diet  2.  History of Barrett's esophagus  3.  History of asthma with occasional steroids but none in the last 5 months  4.  Migraines  5.  vertigo  6.  Osteoporosis on Prolia and infusions  7 obstructive sleep apnea on CPAP  ??  Current Diabetes Medication  Ozempic 0.5 mg weekly  ??  Saw her for the first time a month ago, she was waking up in the morning with blood sugars ranging between 200-220.  Her first meal the day is an egg omelette with spinach and perhaps a slice of gluten-free toast.  Her second meal is a dinner meal.  Her blood sugar before that meal ranges between 240 and 300.  Dinner can be a meat and greens or crab cakes and salad.  Last night she had gluten-free spaghetti.  Physical activity is limited because of her asthma and her degenerative joint disease.  After that initial visit, I started Ozempic 0.25 mg which she has been taking for a month. She only today took her first dose of 0.5 mg weekly. Even on the very low dose of Ozempic, her fasting blood sugars are now in the 130-160 range. She is very pleased. She has noted a decrease in appetite but has no adverse effects that she is concerned about.    Examination  Blood pressure 172/93  Pulse 92  Weight 207  HEENT unremarkable  Lungs clear  Heart reveals a regular rate and rhythm   Abdomen obese  Extremities unremarkable  Diabetic foot exam:     Left Foot:   Visual Exam: normal    Pulse DP: 2+ (normal)   Filament test: absent sensation    Vibratory sensation: absent      Right Foot:   Visual Exam: normal    Pulse DP: 2+ (normal)   Filament test: absent sensation    Vibratory sensation: absent    Impression  1. Type 2 diabetes mellitus with a significant improvement in glucose on Ozempic 0.25 which was recently increased to 0.5 mg weekly.  2. Peripheral diabetic neuropathy  3. Celiac disease    Plan:  1. I did not change the regimen. We will continue with 0.5 mg of Ozempic weekly  2. I refill the prescription  3. I will see her back in 3 months and we will repeat her A1c at that time.

## 2020-03-27 ENCOUNTER — Encounter

## 2020-03-30 ENCOUNTER — Encounter

## 2020-03-30 MED ORDER — DULOXETINE 60 MG CAP, DELAYED RELEASE
60 mg | ORAL_CAPSULE | Freq: Every day | ORAL | 0 refills | Status: DC
Start: 2020-03-30 — End: 2020-05-15

## 2020-03-30 MED ORDER — DULOXETINE 60 MG CAP, DELAYED RELEASE
60 mg | ORAL_CAPSULE | ORAL | 11 refills | Status: DC
Start: 2020-03-30 — End: 2020-07-25

## 2020-03-30 NOTE — Telephone Encounter (Signed)
Last office visit 01/18/2020  Last med refill 01/18/2020 needs 90 day full supply

## 2020-05-10 ENCOUNTER — Encounter

## 2020-05-11 ENCOUNTER — Inpatient Hospital Stay: Payer: PRIVATE HEALTH INSURANCE | Attending: Specialist | Primary: Family Medicine

## 2020-05-15 ENCOUNTER — Ambulatory Visit: Attending: Internal Medicine | Primary: Family Medicine

## 2020-05-15 ENCOUNTER — Inpatient Hospital Stay: Admit: 2020-05-15 | Payer: PRIVATE HEALTH INSURANCE | Primary: Family Medicine

## 2020-05-15 ENCOUNTER — Ambulatory Visit
Admit: 2020-05-15 | Discharge: 2020-05-15 | Payer: PRIVATE HEALTH INSURANCE | Attending: Internal Medicine | Primary: Family Medicine

## 2020-05-15 DIAGNOSIS — E1165 Type 2 diabetes mellitus with hyperglycemia: Secondary | ICD-10-CM

## 2020-05-15 DIAGNOSIS — K449 Diaphragmatic hernia without obstruction or gangrene: Secondary | ICD-10-CM

## 2020-05-15 LAB — AMB POC HEMOGLOBIN A1C
Hemoglobin A1C, POC: 6.8 %
Hemoglobin A1c (POC): 6.8 %

## 2020-05-15 NOTE — Progress Notes (Signed)
This is a 69 year old white female with a history of type 2 diabetes mellitus x3 years referred for evaluation and management. ??She has a strong family history of diabetes. ??Her father and paternal grandfather either have or had diabetes. ??She was found to have blood diabetes on blood work. ??She says her most recent A1c was 7% but that was in January 2020. ??She has been on a number of different medications. ??She was placed on Metformin but did not tolerate it. ??In part this is because of her celiac disease. ??In any case she has been on Bydureon  for the last 2 years but thinks it did  not do anything.  She is now on Ozempic.  ??  Past medical history  1. ??Celiac disease currently on a gluten-free diet  2. ??History of Barrett's esophagus  3. ??History of asthma with occasional steroids but none in the last 5 months  4. ??Migraines  5.????vertigo  6. ??Osteoporosis on Prolia and infusions  7.  obstructive sleep apnea on CPAP  ??  Current Diabetes Medication  Ozempic 0.5 mg weekly  ??  I saw her for the first time in April 2021. At that time , she was waking up in the morning with blood sugars ranging between 200-220. ??Her first meal the day is an egg omelette with spinach and perhaps a slice of gluten-free toast. ??Her second meal is a dinner meal. ??Her blood sugar before that meal ranges between 240 and 300. ??Dinner can be a meat and greens or crab cakes and salad. ??Last night she had gluten-free spaghetti. ??Physical activity is limited because of her asthma and her degenerative joint disease.    After that initial visit, I started Ozempic 0.25 mg which was advanced to 0.5 mg weekly. Even on the very low dose of Ozempic, her fasting blood sugars are now in the 130-160 range. She is very pleased. She has noted a decrease in appetite but has no adverse effects that she is concerned about.  Her A1c today is 6.8%.    She is frustrated with a gluten-free diet.  She is finding many foods that she thinks a gluten-free turn out not  to be gluten-free.  She is developed some constipation and her GI physician has told her that it is because she does not eat gluten-free exclusively.  It is possible however that her constipation is related to her migraine medication.  In any case, she is being evaluated by GI for this currently.    She also complains of symptoms suggestive of degenerative joint disease/frozen shoulder.    Examination  Blood pressure 132/60  Pulse 99  Weight 203  BMI 37.1  HEENT unremarkable  Abdomen obese  Extremities unremarkable  Diabetic foot exam:     Left Foot:   Visual Exam: normal    Pulse DP: 2+ (normal)   Filament test: absent sensation    Vibratory sensation: absent      Right Foot:   Visual Exam: normal    Pulse DP: 2+ (normal)   Filament test: absent sensation    Vibratory sensation: absent    Impression  1.  Type 2 diabetes mellitus with good glucose control on Ozempic 0.5 mg weekly. A1c 6.8%  2.  Diabetic peripheral neuropathy  3.  Gluten enteropathy with constipation    Plan:  1.  We will continue the current regimen which is very simple and seems to be working well.  2.  I did recommend that she see orthopedic for evaluation  of her shoulder and other joint issues  3.  I will see her back in 4 months.

## 2020-07-20 ENCOUNTER — Encounter: Attending: Neurology | Primary: Family Medicine

## 2020-07-25 ENCOUNTER — Encounter

## 2020-07-26 MED ORDER — DULOXETINE 60 MG CAP, DELAYED RELEASE
60 mg | ORAL_CAPSULE | ORAL | 11 refills | Status: DC
Start: 2020-07-26 — End: 2020-08-01

## 2020-07-26 MED ORDER — ZOLMITRIPTAN 5 MG TAB, RAPID DISSOLVE
5 mg | ORAL_TABLET | ORAL | 5 refills | Status: DC | PRN
Start: 2020-07-26 — End: 2020-08-01

## 2020-08-01 ENCOUNTER — Ambulatory Visit: Attending: Neurology | Primary: Family Medicine

## 2020-08-01 ENCOUNTER — Ambulatory Visit: Admit: 2020-08-01 | Payer: PRIVATE HEALTH INSURANCE | Attending: Neurology | Primary: Family Medicine

## 2020-08-01 DIAGNOSIS — E1142 Type 2 diabetes mellitus with diabetic polyneuropathy: Secondary | ICD-10-CM

## 2020-08-01 MED ORDER — ZOLMITRIPTAN 5 MG TAB, RAPID DISSOLVE
5 mg | ORAL_TABLET | ORAL | 5 refills | Status: AC | PRN
Start: 2020-08-01 — End: ?

## 2020-08-01 MED ORDER — DULOXETINE 30 MG CAP, DELAYED RELEASE
30 mg | ORAL_CAPSULE | ORAL | 0 refills | Status: DC
Start: 2020-08-01 — End: 2020-11-02

## 2020-08-01 MED ORDER — AIMOVIG AUTOINJECTOR 140 MG/ML SUBCUTANEOUS AUTO-INJECTOR
140 mg/mL | SUBCUTANEOUS | 3 refills | Status: AC
Start: 2020-08-01 — End: ?

## 2020-08-01 NOTE — Progress Notes (Signed)
 Identified pt with two pt identifiers(name and DOB). Reviewed record in preparation for visit and have obtained necessary documentation.  Chief Complaint   Patient presents with   . Follow-up   . Peripheral Neuropathy   . Migraine      Vitals:    08/01/20 0954   BP: 128/70   Pulse: 82   Resp: 18   Temp: 98 F (36.7 C)   TempSrc: Oral   SpO2: 98%   Weight: 203 lb (92.1 kg)   Height: 5' 2 (1.575 m)   PainSc:   0 - No pain       Health Maintenance Review: Patient reminded of due or due soon health maintenance. I have asked the patient to contact his/her primary care provider (PCP) for follow-up on his/her health maintenance.    Coordination of Care Questionnaire:  :   1) Have you been to an emergency room, urgent care, or hospitalized since your last visit?  If yes, where when, and reason for visit? NO      2. Have seen or consulted any other health care provider since your last visit?   If yes, where when, and reason for visit?  NO      Patient is accompanied by self I have received verbal consent from Kayla Shaffer to discuss any/all medical information while they are present in the room.

## 2020-08-01 NOTE — Progress Notes (Signed)
Follow-up Visit    Name Kayla Shaffer Age 69 y.o.   MRN [id number] DOB 1950-09-23       Chief Complaint: headaches    Uses zomig once a month. She has celiac disease so uses tylenol when the migraines are not bad and it takes the edge off. 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     Assesment and Plan  1. Diabetic polyneuropathy associated with type 2 diabetes mellitus (HCC)  a1c 6.8    2. Intractable chronic migraine without aura and without status migrainosus  Continue headaches     3.Back pain  Referred to pain management      Allergies  Ceftin [cefuroxime axetil], Bactrim [sulfamethoprim], Codeine, and Sulfa (sulfonamide antibiotics)     Medications  Current Outpatient Medications   Medication Sig   ??? lisinopriL (PRINIVIL, ZESTRIL) 10 mg tablet Take 10 mg by mouth daily.   ??? ZOLMitriptan (Zomig ZMT) 5 mg disintegrating tablet Take 1 Tablet by mouth as needed for PRN Reason (Other) (headache). Take 5 mg by mouth as needed for Migraine.   ??? DULoxetine (CYMBALTA) 60 mg capsule TAKE 1 CAPSULE DAILY   ??? ProAir HFA 90 mcg/actuation inhaler    ??? semaglutide (OZEMPIC) 0.25 mg/0.2 mL (2 mg/1.5 mL) sub-q pen 0.5 mg weekly   ??? famotidine (PEPCID) 40 mg tablet Take 40 mg by mouth daily.   ??? RABEprazole (ACIPHEX) 20 mg TbEC Take 20 mg by mouth daily.   ??? erenumab-aooe (Aimovig Autoinjector) 140 mg/mL injection 1 mL by SubCUTAneous route every thirty (30) days.   ??? atorvastatin (LIPITOR) 40 mg tablet    ??? cpap machine kit by Does Not Apply route.   ??? buPROPion XL (WELLBUTRIN XL) 300 mg XL tablet Take 300 mg by mouth every morning.   ??? estradiol (VAGIFEM) 10 mcg tab vaginal tablet Insert 10 mcg into vagina every seven (7) days.   ??? fluticasone (FLOVENT HFA) 110 mcg/actuation inhaler Take 2 Puffs by inhalation every twelve (12) hours.   ??? OptiChamber [facility] USE AS DIRECTED AS NEEDED (Patient not taking: Reported on 08/01/2020)   ??? lidocaine (XYLOCAINE) 5 % ointment  (Patient not  taking: Reported on 08/01/2020)   ??? denosumab (PROLIA) 60 mg/mL injection 60 mg by SubCUTAneous route every 6 months. (Patient not taking: Reported on 08/01/2020)     No current facility-administered medications for this visit.        Medical History  Past Medical History:   Diagnosis Date   ??? Asthma     last flare up over a year ago as stated 09/02/2017   ??? Barrett esophagus    ??? BPPV (benign paroxysmal positional vertigo), right    ??? Celiac disease    ??? Diabetes (HCC)     no medications, just watching   ??? Heart murmur    ??? Hiatal hernia     sliding   ??? Menopause    ??? Migraine    ??? Osteoarthritis    ??? Osteoporosis    ??? Sleep apnea        Review of Systems   Constitutional: Negative for chills and fever.   HENT: Negative for ear pain.    Eyes: Negative for pain and discharge.   Respiratory: Negative for cough and hemoptysis.    Cardiovascular: Negative for chest pain and claudication.   Gastrointestinal: Negative for constipation and diarrhea.   Genitourinary: Negative for flank pain and hematuria.   Musculoskeletal: Positive  for back pain and neck pain. Negative for myalgias.   Skin: Negative for itching and rash.   Neurological: Positive for sensory change and headaches.   Endo/Heme/Allergies: Negative for environmental allergies. Does not bruise/bleed easily.   Psychiatric/Behavioral: Negative for depression and hallucinations.       Exam:  Visit Vitals  BP 128/70 (BP 1 Location: Left arm, BP Patient Position: Sitting, BP Cuff Size: Adult)   Pulse 82   Temp 98 ??F (36.7 ??C) (Oral)   Resp 18   Ht '5\' 2"'  (1.575 m)   Wt 203 lb (92.1 kg)   SpO2 98%   BMI 37.13 kg/m??        General: Well developed, well nourished. Patient in no apparent distress   Head: Normocephalic, atraumatic, anicteric sclera   Neck Normal ROM, No thyromegally   Lungs:  Clear to auscultation    Cardiac: Regular rate and rhythm with no murmurs.   Abd: Bowel sounds were audible.    Ext: No pedal edema   Skin: Supple no rash     NeurologicExam:  Mental  Status: Alert and oriented to person place and time   Speech: Fluent no aphasia or dysarthria.   Cranial Nerves:  II - XII Intact   Motor:  Full and symmetric strength of upper and lower extremities. Normal bulk and tone.    Reflexes:   Deep tendon reflexes 0+/4 and symmetric.   Sensory:   Symmetric distal reduction in sensory perception affecting all modalities   Gait:  Gait is balanced  with normal arm swing.    Tremor:   No tremor noted.   Cerebellar:  Coordination intact.    Neurovascular: No carotid bruits. No JVD          Lab Review  Lab Results   Component Value Date/Time    WBC 6.1 10/15/2018 10:56 AM    HCT 44.1 10/15/2018 10:56 AM    HGB 15.2 10/15/2018 10:56 AM    PLATELET 300 10/15/2018 10:56 AM       Lab Results   Component Value Date/Time    Sodium 144 10/15/2018 10:56 AM    Potassium 5.0 10/15/2018 10:56 AM    Chloride 109 (H) 10/15/2018 10:56 AM    CO2 21 10/15/2018 10:56 AM    Glucose 175 (H) 10/15/2018 10:56 AM    BUN 15 10/15/2018 10:56 AM    Creatinine 0.93 10/15/2018 10:56 AM    Calcium 9.3 10/15/2018 10:56 AM           Lab Results   Component Value Date/Time    Hemoglobin A1c 7.0 (H) 10/15/2018 10:56 AM    Hemoglobin A1c (POC) 6.8 05/15/2020 02:21 PM        Lab Results   Component Value Date/Time    Vitamin B12 714 10/15/2018 10:56 AM

## 2020-09-04 ENCOUNTER — Ambulatory Visit: Payer: PRIVATE HEALTH INSURANCE | Attending: Internal Medicine | Primary: Family Medicine

## 2020-10-13 ENCOUNTER — Inpatient Hospital Stay
Admit: 2020-10-13 | Discharge: 2020-10-13 | Disposition: A | Discharge status: Home / Self Care | Payer: PRIVATE HEALTH INSURANCE | Attending: Emergency Medicine

## 2020-10-13 DIAGNOSIS — I10 Essential (primary) hypertension: Secondary | ICD-10-CM

## 2020-10-13 LAB — CBC WITH AUTO DIFFERENTIAL
Basophils %: 1 % (ref 0–1)
Basophils Absolute: 0.1 10*3/uL (ref 0.0–0.1)
Eosinophils %: 1 % (ref 0–7)
Eosinophils Absolute: 0.1 10*3/uL (ref 0.0–0.4)
Granulocyte Absolute Count: 0 10*3/uL (ref 0.00–0.04)
Hematocrit: 48.1 % — ABNORMAL HIGH (ref 35.0–47.0)
Hemoglobin: 16.1 g/dL — ABNORMAL HIGH (ref 11.5–16.0)
Immature Granulocytes: 0 % (ref 0.0–0.5)
Lymphocytes %: 32 % (ref 12–49)
Lymphocytes Absolute: 2.9 10*3/uL (ref 0.8–3.5)
MCH: 32.3 PG (ref 26.0–34.0)
MCHC: 33.5 g/dL (ref 30.0–36.5)
MCV: 96.4 FL (ref 80.0–99.0)
MPV: 9.4 FL (ref 8.9–12.9)
Monocytes %: 8 % (ref 5–13)
Monocytes Absolute: 0.7 10*3/uL (ref 0.0–1.0)
NRBC Absolute: 0 10*3/uL (ref 0.00–0.01)
Neutrophils %: 58 % (ref 32–75)
Neutrophils Absolute: 5 10*3/uL (ref 1.8–8.0)
Nucleated RBCs: 0 PER 100 WBC
Platelets: 379 10*3/uL (ref 150–400)
RBC: 4.99 M/uL (ref 3.80–5.20)
RDW: 13.3 % (ref 11.5–14.5)
WBC: 8.8 10*3/uL (ref 3.6–11.0)

## 2020-10-13 LAB — EKG 12-LEAD
Atrial Rate: 108 {beats}/min
P Axis: 58 degrees
P-R Interval: 134 ms
Q-T Interval: 340 ms
QRS Duration: 76 ms
QTc Calculation (Bazett): 455 ms
R Axis: -12 degrees
T Axis: 104 degrees
Ventricular Rate: 108 {beats}/min

## 2020-10-13 LAB — COMPREHENSIVE METABOLIC PANEL
ALT: 66 U/L (ref 12–78)
AST: 30 U/L (ref 15–37)
Albumin/Globulin Ratio: 1 — ABNORMAL LOW (ref 1.1–2.2)
Albumin: 3.9 g/dL (ref 3.5–5.0)
Alkaline Phosphatase: 119 U/L — ABNORMAL HIGH (ref 45–117)
Anion Gap: 6 mmol/L (ref 5–15)
BUN: 14 MG/DL (ref 6–20)
Bun/Cre Ratio: 12 (ref 12–20)
CO2: 25 mmol/L (ref 21–32)
Calcium: 9.7 MG/DL (ref 8.5–10.1)
Chloride: 110 mmol/L — ABNORMAL HIGH (ref 97–108)
Creatinine: 1.13 MG/DL — ABNORMAL HIGH (ref 0.55–1.02)
EGFR IF NonAfrican American: 48 mL/min/{1.73_m2} — ABNORMAL LOW (ref >60)
GFR African American: 58 mL/min/{1.73_m2} — ABNORMAL LOW (ref >60)
Globulin: 3.9 g/dL (ref 2.0–4.0)
Glucose: 99 mg/dL (ref 65–100)
Potassium: 4.4 mmol/L (ref 3.5–5.1)
Sodium: 141 mmol/L (ref 136–145)
Total Bilirubin: 0.6 MG/DL (ref 0.2–1.0)
Total Protein: 7.8 g/dL (ref 6.4–8.2)

## 2020-10-13 LAB — D-DIMER, QUANTITATIVE: D-Dimer, Quant: 0.55 mg/L FEU (ref 0.00–0.65)

## 2020-10-13 LAB — TROPONIN, HIGH SENSITIVITY: Troponin, High Sensitivity: 20 ng/L (ref 0–51)

## 2020-10-13 LAB — CBC WITH AUTOMATED DIFF
ABS. BASOPHILS: 0.1 10*3/uL (ref 0.0–0.1)
ABS. EOSINOPHILS: 0.1 10*3/uL (ref 0.0–0.4)
ABS. IMM. GRANS.: 0 10*3/uL (ref 0.00–0.04)
ABS. LYMPHOCYTES: 2.9 10*3/uL (ref 0.8–3.5)
ABS. MONOCYTES: 0.7 10*3/uL (ref 0.0–1.0)
ABS. NEUTROPHILS: 5 10*3/uL (ref 1.8–8.0)
ABSOLUTE NRBC: 0 10*3/uL (ref 0.00–0.01)
BASOPHILS: 1 % (ref 0–1)
EOSINOPHILS: 1 % (ref 0–7)
HCT: 48.1 % — ABNORMAL HIGH (ref 35.0–47.0)
HGB: 16.1 g/dL — ABNORMAL HIGH (ref 11.5–16.0)
IMMATURE GRANULOCYTES: 0 % (ref 0.0–0.5)
LYMPHOCYTES: 32 % (ref 12–49)
MCH: 32.3 PG (ref 26.0–34.0)
MCHC: 33.5 g/dL (ref 30.0–36.5)
MCV: 96.4 FL (ref 80.0–99.0)
MONOCYTES: 8 % (ref 5–13)
MPV: 9.4 FL (ref 8.9–12.9)
NEUTROPHILS: 58 % (ref 32–75)
NRBC: 0 PER 100 WBC
PLATELET: 379 10*3/uL (ref 150–400)
RBC: 4.99 M/uL (ref 3.80–5.20)
RDW: 13.3 % (ref 11.5–14.5)
WBC: 8.8 10*3/uL (ref 3.6–11.0)

## 2020-10-13 LAB — EKG, 12 LEAD, INITIAL
Atrial Rate: 108 {beats}/min
Calculated P Axis: 58 degrees
Calculated R Axis: -12 degrees
Calculated T Axis: 104 degrees
P-R Interval: 134 ms
Q-T Interval: 340 ms
QRS Duration: 76 ms
QTC Calculation (Bezet): 455 ms
Ventricular Rate: 108 {beats}/min

## 2020-10-13 LAB — METABOLIC PANEL, COMPREHENSIVE
A-G Ratio: 1 — ABNORMAL LOW (ref 1.1–2.2)
ALT (SGPT): 66 U/L (ref 12–78)
AST (SGOT): 30 U/L (ref 15–37)
Albumin: 3.9 g/dL (ref 3.5–5.0)
Alk. phosphatase: 119 U/L — ABNORMAL HIGH (ref 45–117)
Anion gap: 6 mmol/L (ref 5–15)
BUN/Creatinine ratio: 12 (ref 12–20)
BUN: 14 MG/DL (ref 6–20)
Bilirubin, total: 0.6 MG/DL (ref 0.2–1.0)
CO2: 25 mmol/L (ref 21–32)
Calcium: 9.7 MG/DL (ref 8.5–10.1)
Chloride: 110 mmol/L — ABNORMAL HIGH (ref 97–108)
Creatinine: 1.13 MG/DL — ABNORMAL HIGH (ref 0.55–1.02)
GFR est AA: 58 mL/min/{1.73_m2} — ABNORMAL LOW (ref >60)
GFR est non-AA: 48 mL/min/{1.73_m2} — ABNORMAL LOW (ref >60)
Globulin: 3.9 g/dL (ref 2.0–4.0)
Glucose: 99 mg/dL (ref 65–100)
Potassium: 4.4 mmol/L (ref 3.5–5.1)
Protein, total: 7.8 g/dL (ref 6.4–8.2)
Sodium: 141 mmol/L (ref 136–145)

## 2020-10-13 LAB — SAMPLES BEING HELD

## 2020-10-13 LAB — D DIMER: D-dimer: 0.55 mg/L FEU (ref 0.00–0.65)

## 2020-10-13 LAB — TROPONIN-HIGH SENSITIVITY: Troponin-High Sensitivity: 20 ng/L (ref 0–51)

## 2020-10-13 MED ORDER — LIDOCAINE 4 % TOPICAL PATCH (12 HOUR DURATION)
4 % | CUTANEOUS | Status: DC
Start: 2020-10-13 — End: 2020-10-13

## 2020-10-13 MED ORDER — METHOCARBAMOL 750 MG TAB
750 mg | ORAL | Status: AC
Start: 2020-10-13 — End: 2020-10-13
  Administered 2020-10-13: 19:00:00 via ORAL

## 2020-10-13 MED FILL — SALONPAS (LIDOCAINE) 4 % TOPICAL PATCH: 4 % | CUTANEOUS | Qty: 1

## 2020-10-13 MED FILL — METHOCARBAMOL 750 MG TAB: 750 mg | ORAL | Qty: 1

## 2020-10-13 NOTE — ED Provider Notes (Signed)
ED Provider Notes by Vernell Morgans, MD at 10/13/20 1252                Author: Vernell Morgans, MD  Service: EMERGENCY  Author Type: Physician       Filed: 10/13/20 1816  Date of Service: 10/13/20 1252  Status: Signed          Editor: Vernell Morgans, MD (Physician)               EMERGENCY DEPARTMENT HISTORY AND PHYSICAL EXAM           Date: 10/13/2020   Patient Name: Kayla Shaffer   Patient Age and Sex: 70 y.o.  female         History of Presenting Illness          Chief Complaint       Patient presents with        ?  Hypertension             She was sent here by Dr. Minette Brine office. She was at [facility] and her BP was high.            History Provided By: Patient      HPI: Kayla Shaffer 70 year old  female with a history of celiac disease, Barrett's esophagus presenting with chest pain.  Patient states that she has been dealing with some pains and arthritis in her neck and shoulders so went to orthopedics today.  Patient states that while there her  blood pressure was 148/108 so called her primary care doctor who told her to come to the ER.  Has been having them bilateral chest pain.  Denies any shortness of breath, but has been having some dizziness as well.  She has a history of high blood pressure  and is on blood pressure medications.      There are no other complaints, changes, or physical findings at this time.      PCP: Joycie Peek, MD        No current facility-administered medications on file prior to encounter.          Current Outpatient Medications on File Prior to Encounter          Medication  Sig  Dispense  Refill           ?  lisinopriL (PRINIVIL, ZESTRIL) 10 mg tablet  Take 10 mg by mouth daily.         ?  erenumab-aooe (Aimovig Autoinjector) 140 mg/mL injection  1 mL by SubCUTAneous route every thirty (30) days.  3 mL  3     ?  ZOLMitriptan (Zomig ZMT) 5 mg disintegrating tablet  Take 1 Tablet by mouth as needed for PRN Reason (Other) (headache). Take 5 mg by mouth as needed  for Migraine.  9 Tablet  5     ?  DULoxetine (CYMBALTA) 30 mg capsule  TAKE 1 CAPSULE DAILY  90 Capsule  0           ?  ProAir HFA 90 mcg/actuation inhaler                 ?  semaglutide (OZEMPIC) 0.25 mg/0.2 mL (2 mg/1.5 mL) sub-q pen  0.5 mg weekly  3 Box  3     ?  famotidine (PEPCID) 40 mg tablet  Take 40 mg by mouth daily.         ?  RABEprazole (ACIPHEX) 20 mg TbEC  Take 20 mg by  mouth daily.         ?  atorvastatin (LIPITOR) 40 mg tablet           ?  cpap machine kit  by Does Not Apply route.         ?  buPROPion XL (WELLBUTRIN XL) 300 mg XL tablet  Take 300 mg by mouth every morning.         ?  estradiol (VAGIFEM) 10 mcg tab vaginal tablet  Insert 10 mcg into vagina every seven (7) days.               ?  fluticasone (FLOVENT HFA) 110 mcg/actuation inhaler  Take 2 Puffs by inhalation every twelve (12) hours.                 Past History        Past Medical History:     Past Medical History:        Diagnosis  Date         ?  Asthma            last flare up over a year ago as stated 09/02/2017         ?  Barrett esophagus       ?  BPPV (benign paroxysmal positional vertigo), right       ?  Celiac disease       ?  Diabetes ([place])            no medications, just watching         ?  Heart murmur       ?  Hiatal hernia            sliding         ?  Menopause       ?  Migraine       ?  Osteoarthritis       ?  Osteoporosis           ?  Sleep apnea             Past Surgical History:     Past Surgical History:         Procedure  Laterality  Date          ?  COLONOSCOPY  N/A  09/18/2017          COLONOSCOPY/EGD performed by [name], MD at [facility]          ?  HX APPENDECTOMY         ?  HX BREAST BIOPSY  Left            Stereotactic biopsy. Benign. Long time ago. Clip.          ?  HX CARPAL TUNNEL RELEASE  Bilateral       ?  HX CATARACT REMOVAL  Bilateral  2016     ?  HX COLONOSCOPY         ?  HX ENDOSCOPY         ?  HX HYSTERECTOMY              a long time ago          ?  HX INTRACRANIAL ANEURYSM REPAIR   Right  10/2015          behind right eye, clip, done at [facility], safe for MRI          ?  HX OPEN REDUCTION INTERNAL  FIXATION  Right  1993          no hardware, wrist          ?  HX OPEN REDUCTION INTERNAL FIXATION  Left  2014          wrist, no hardware           Family History:     Family History         Problem  Relation  Age of Onset          ?  Breast Cancer  Paternal Grandmother                onset: 17s          ?  Cancer  Paternal Grandmother       ?  Dementia  Mother       ?  Headache  Mother       ?  Migraines  Mother       ?  Heart Disease  Father       ?  Parkinsonism  Father       ?  Stroke  Father       ?  Breast Cancer  Niece            ?  Stroke  Maternal Grandmother             Social History:     Social History          Tobacco Use         ?  Smoking status:  Former Smoker              Packs/day:  0.00         Years:  15.00         Pack years:  0.00         Quit date:  09/17/2011         Years since quitting:  9.0         ?  Smokeless tobacco:  Never Used       Vaping Use         ?  Vaping Use:  Never used       Substance Use Topics         ?  Alcohol use:  Yes              Alcohol/week:  0.0 standard drinks             Comment: Maybe 1-2 Shots a month         ?  Drug use:  No           Allergies:     Allergies        Allergen  Reactions         ?  Ceftin [Cefuroxime Axetil]  Anaphylaxis             Throat almost closed off         ?  Bactrim [Sulfamethoprim]  Swelling             Throat swelling         ?  Codeine  Swelling             throat         ?  Sulfa (Sulfonamide Antibiotics)  Swelling             Swollen throat  Review of Systems     Review of Systems    Constitutional: Negative for chills and fever.    Respiratory: Negative for cough and shortness of breath.     Cardiovascular: Positive for chest pain.    Gastrointestinal: Negative for abdominal pain, constipation, diarrhea, nausea and vomiting.    Genitourinary: Negative for dysuria, frequency and hematuria.     Neurological: Negative for weakness and numbness.    All other systems reviewed and are negative.            Physical Exam     Physical Exam   Vitals and nursing note reviewed.   Constitutional:        Appearance: She is well-developed.   HENT :       Head: Normocephalic and atraumatic.      Nose: Nose normal.      Mouth/Throat:      Mouth: Mucous membranes are moist.    Eyes:       Extraocular Movements: Extraocular movements intact.      Conjunctiva/sclera: Conjunctivae normal.   Cardiovascular:       Rate and Rhythm: Normal rate and regular rhythm.   Pulmonary :       Effort: Pulmonary effort is normal. No respiratory distress.      Breath sounds: Normal breath sounds.      Comments:  Patient ambulated without difficulty.  Tender to palpation over the bilateral chest wall.    Abdominal :      General: There is no distension.      Palpations: Abdomen is soft.      Tenderness: There is no abdominal tenderness.     Musculoskeletal:          General: Normal range of motion.      Cervical back: Normal range of motion and neck supple.    Skin:      General: Skin is warm and dry.   Neurological :       General: No focal deficit present.      Mental Status: She is alert and oriented to person, place, and time. Mental status is at baseline.   Psychiatric:         Mood and Affect: Mood normal.                Diagnostic Study Results        Labs -         Recent Results (from the past 12 hour(s))     EKG, 12 LEAD, INITIAL          Collection Time: 10/13/20 12:38 PM         Result  Value  Ref Range            Ventricular Rate  108  BPM       Atrial Rate  108  BPM       P-R Interval  134  ms       QRS Duration  76  ms       Q-T Interval  340  ms       QTC Calculation (Bezet)  455  ms       Calculated P Axis  58  degrees       Calculated R Axis  -12  degrees       Calculated T Axis  104  degrees       Diagnosis  Sinus tachycardia   Left ventricular hypertrophy with repolarization abnormality   When compared with  ECG of 07-Jan-2019 17:12,   No significant change was found   Confirmed by Rohatgi, Sameer [phone]) on 10/13/2020 5:23:54 PM          METABOLIC PANEL, COMPREHENSIVE          Collection Time: 10/13/20 12:50 PM         Result  Value  Ref Range            Sodium  141  136 - 145 mmol/L       Potassium  4.4  3.5 - 5.1 mmol/L       Chloride  110 (H)  97 - 108 mmol/L       CO2  25  21 - 32 mmol/L       Anion gap  6  5 - 15 mmol/L       Glucose  99  65 - 100 mg/dL       BUN  14  6 - 20 MG/DL       Creatinine  1.13 (H)  0.55 - 1.02 MG/DL       BUN/Creatinine ratio  12  12 - 20         GFR est AA  58 (L)  >60 ml/min/1.45m       GFR est non-AA  48 (L)  >60 ml/min/1.734m      Calcium  9.7  8.5 - 10.1 MG/DL       Bilirubin, total  0.6  0.2 - 1.0 MG/DL       ALT (SGPT)  66  12 - 78 U/L       AST (SGOT)  30  15 - 37 U/L       Alk. phosphatase  119 (H)  45 - 117 U/L       Protein, total  7.8  6.4 - 8.2 g/dL       Albumin  3.9  3.5 - 5.0 g/dL       Globulin  3.9  2.0 - 4.0 g/dL       A-G Ratio  1.0 (L)  1.1 - 2.2         TROPONIN-HIGH SENSITIVITY          Collection Time: 10/13/20 12:50 PM         Result  Value  Ref Range            Troponin-High Sensitivity  20  0 - 51 ng/L       D DIMER          Collection Time: 10/13/20  1:05 PM         Result  Value  Ref Range            D-dimer  0.55  0.00 - 0.65 mg/L FEU       CBC WITH AUTOMATED DIFF          Collection Time: 10/13/20  1:56 PM         Result  Value  Ref Range            WBC  8.8  3.6 - 11.0 K/uL       RBC  4.99  3.80 - 5.20 M/uL       HGB  16.1 (H)  11.5 - 16.0 g/dL       HCT  48.1 (H)  35.0 - 47.0 %  MCV  96.4  80.0 - 99.0 FL       MCH  32.3  26.0 - 34.0 PG       MCHC  33.5  30.0 - 36.5 g/dL       RDW  13.3  11.5 - 14.5 %       PLATELET  379  150 - 400 K/uL       MPV  9.4  8.9 - 12.9 FL       NRBC  0.0  0 PER 100 WBC       ABSOLUTE NRBC  0.00  0.00 - 0.01 K/uL       NEUTROPHILS  58  32 - 75 %       LYMPHOCYTES  32  12 - 49 %       MONOCYTES  8  5 - 13 %       EOSINOPHILS   1  0 - 7 %       BASOPHILS  1  0 - 1 %       IMMATURE GRANULOCYTES  0  0.0 - 0.5 %       ABS. NEUTROPHILS  5.0  1.8 - 8.0 K/UL       ABS. LYMPHOCYTES  2.9  0.8 - 3.5 K/UL       ABS. MONOCYTES  0.7  0.0 - 1.0 K/UL       ABS. EOSINOPHILS  0.1  0.0 - 0.4 K/UL       ABS. BASOPHILS  0.1  0.0 - 0.1 K/UL       ABS. IMM. GRANS.  0.0  0.00 - 0.04 K/UL       DF  AUTOMATED          SAMPLES BEING HELD          Collection Time: 10/13/20  1:56 PM         Result  Value  Ref Range            SAMPLES BEING HELD  SST, PST         COMMENT                  Add-on orders for these samples will be processed based on acceptable specimen integrity and analyte stability, which may vary by analyte.           Radiologic Studies -      No orders to display          CT Results   (Last 48 hours)          None                 CXR Results   (Last 48 hours)          None                       Medical Decision Making     I am the first provider for this patient.      I reviewed the vital signs, available nursing notes, past medical history, past surgical history, family history and social history.      Vital Signs-Reviewed the patient's vital signs.   Patient Vitals for the past 12 hrs:            Temp  Pulse  Resp  BP  SpO2            10/13/20 1434  --  (!) 102  16  129/81  99 %            10/13/20 1244  98.1 ??F (36.7 ??C)  (!) 115  18  (!) 148/96  99 %           Records Reviewed: Nursing Notes and Old Medical Records      Provider Notes (Medical Decision Making):    Patient presents with CP.  DDx:  ACS, Aortic dissection, PNA, PE, PTX, pericarditis, myocarditis, GERD, costochondritis, anxiety.  Concerned for musculoskeletal given the fact that  patient is tender on exam and pain is worse with movement.  Given the HPI and Physical exam. Will obtain labs, CXR, EKG and get Cardiology Consult PRN.  Patient's blood pressure right now doing well at 148/96.  She is slightly tachycardic so we will get  a D-dimer on her.*            ED Course:    Initial  assessment performed. The patients presenting problems have been discussed, and they are in agreement with the care plan formulated and outlined with them.  I have encouraged them to ask questions as they arise throughout their visit.          Critical Care Time:    0      Disposition:   Discharge Note:   The patient has been re-evaluated and is ready for discharge. Reviewed available results with patient. Counseled patient on diagnosis and care plan. Patient has expressed understanding, and all questions  have been answered. Patient agrees with plan and agrees to follow up as recommended, or to return to the ED if their symptoms worsen. Discharge instructions have been provided and explained to the patient, along with reasons to return to the ED.        PLAN:     Discharge Medication List as of 10/13/2020  2:31 PM               2.      Follow-up Information               Follow up With  Specialties  Details  Why  Contact Info              Joycie Peek, MD  Family Medicine    As needed  [address]   [place] [address]   [phone]                3.  Return to ED if worse         Diagnosis        Clinical Impression:       1.  Acute chest pain         2.  Essential hypertension            Attestations:      Vernell Morgans, M.D.              Please note that this dictation was completed with Dragon, the computer voice recognition software.  Quite often unanticipated grammatical, syntax, homophones, and other interpretive errors are inadvertently transcribed by the computer software.  Please  disregard these errors.  Please excuse any errors that have escaped final proofreading.  Thank you.

## 2020-10-30 ENCOUNTER — Ambulatory Visit: Attending: Internal Medicine | Primary: Family Medicine

## 2020-10-30 ENCOUNTER — Ambulatory Visit: Admit: 2020-10-30 | Payer: PRIVATE HEALTH INSURANCE | Attending: Internal Medicine | Primary: Family Medicine

## 2020-10-30 DIAGNOSIS — E1165 Type 2 diabetes mellitus with hyperglycemia: Secondary | ICD-10-CM

## 2020-10-30 LAB — AMB POC HEMOGLOBIN A1C
Hemoglobin A1C, POC: 6.4 %
Hemoglobin A1c (POC): 6.4 %

## 2020-10-30 MED ORDER — BLOOD GLUCOSE METER KIT
PACK | 0 refills | Status: AC
Start: 2020-10-30 — End: ?

## 2020-10-30 MED ORDER — LANCETS 33 GAUGE
33 gauge | 3 refills | Status: DC
Start: 2020-10-30 — End: 2020-11-27

## 2020-10-30 MED ORDER — ONETOUCH ULTRA TEST STRIPS
ORAL_STRIP | 3 refills | Status: DC
Start: 2020-10-30 — End: 2020-11-03

## 2020-10-30 NOTE — Progress Notes (Signed)
This is a 70 year old white female with a history of type 2 diabetes mellitus x3 years referred for evaluation and management. ??She has a strong family history of diabetes. ??Her father and paternal grandfather either have or had diabetes. ??She was found to have blood diabetes on blood work. ??She says her most recent A1c was 7% but that was in January 2020. ??She has been on a number of different medications. ??She was placed on Metformin but did not tolerate it. ??In part this is because of her celiac disease. ??In any case she has been on??Bydureon????for the last 2 years but thinks it did????not do anything.  She is now on Ozempic.  ??  Past medical history  1. ??Celiac disease currently on a gluten-free diet  2. ??History of Barrett's esophagus  3. ??History of asthma with occasional steroids but none in the last 5 months  4. ??Migraines  5.????vertigo  6. ??Osteoporosis on Prolia and infusions  7.  obstructive sleep apnea on CPAP  ??  Current Diabetes Medication  Ozempic 0.5 mg weekly  ??  I saw her for the first time in April 2021. At that time , she??was waking??up in the morning with blood sugars ranging between 200-220. ??Her first meal the day is an egg omelette with spinach and perhaps a slice of gluten-free toast. ??Her second meal is a dinner meal. ??Her blood sugar before that meal ranges between 240 and 300. ??Dinner can be a meat and greens or crab cakes and salad. ??Last night she had gluten-free spaghetti. ??Physical activity is limited because of her asthma and her degenerative joint disease.  ??  After that initial visit, I started Ozempic 0.25 mg which was advanced to 0.5 mg weekly. Even on the very low dose of Ozempic, her fasting blood sugars are now in the 130-160 range. She is very pleased. She has noted a decrease in appetite but has no adverse effects that she is concerned about that.  When I saw her last her A1c was 6.8%.  Her A1c today is 6.4%.  ??  She is frustrated with a gluten-free diet.  She is finding many  foods that she thinks a gluten-free turn out not to be gluten-free.  She is developed some constipation and her GI physician has told her that it is because she does not eat gluten-free exclusively.  It is possible however that her constipation is related to her migraine medication.  In any case, she is being evaluated by GI for this currently.  ??  She also complains of symptoms suggestive of degenerative joint disease/frozen shoulder.    Examination  Blood pressure 133/83  Pulse 76  Weight 201  BMI 36.7    Impression  1.  Type 2 diabetes mellitus with significantly improved glucose control on Ozempic 0.5 mg once weekly  2.  Gluten enteropathy  3.  Barrett's esophagus  4.  Asthma treated occasionally with glucocorticoids  5. Osteoporosis    Plan:  1.  I did not change the regimen  2.  Please note that her insurance changes in several months and we are hoping that Ozempic will continue to be covered  3.  She did asked that I send a prescription for a regular One Touch meter strips and lancets which I did.  4.  I will see her back in 3 months.  ??

## 2020-11-01 ENCOUNTER — Encounter

## 2020-11-02 MED ORDER — DULOXETINE 60 MG CAP, DELAYED RELEASE
60 mg | ORAL_CAPSULE | Freq: Every day | ORAL | 3 refills | Status: AC
Start: 2020-11-02 — End: ?

## 2020-11-02 NOTE — Telephone Encounter (Signed)
Telephone Encounter by Lonn Georgia, ANP-C at 11/02/20 2706                Author: Lonn Georgia, ANP-C  Service: --  Author Type: Nurse Practitioner       Filed: 11/02/20 0731  Encounter Date: 11/01/2020  Status: Signed          Editor: Doran Stabler (Nurse Practitioner)          Dellia Beckwith, LPN 2/37/6283  1:51 PM EST----- Message -----From: Kayla Portela HaywoodSent: 11/01/2020   2:27 PM ESTTo: [facility] NursesSubject:  Duloxetine Hcl Dr Sheila Oats 60 mg                 The last time I was in the office my Duloxetine was reduced to 40 mg. I was to let you know if that amount still worked.  It doesn't, I wake up at night in pain.  [facility]  the above prescription (60 mg).Thank you.

## 2020-11-02 NOTE — Telephone Encounter (Signed)
A new prescription for Cymbalta 60 has been sent to [facility] with a 90-day supply and refills x1 year

## 2020-11-03 ENCOUNTER — Encounter

## 2020-11-03 NOTE — Telephone Encounter (Signed)
 Requested Prescriptions     Pending Prescriptions Disp Refills   . glucose blood VI test strips (OneTouch Ultra Test) strip 100 Strip 3     Sig: Monitor blood sugar once daily

## 2020-11-04 MED ORDER — ONETOUCH ULTRA TEST STRIPS
ORAL_STRIP | 3 refills | Status: DC
Start: 2020-11-04 — End: 2021-12-14

## 2020-11-27 ENCOUNTER — Encounter

## 2020-11-27 MED ORDER — LANCETS 33 GAUGE
33 gauge | 3 refills | Status: DC
Start: 2020-11-27 — End: 2021-12-14

## 2020-11-27 NOTE — Telephone Encounter (Signed)
 Requested Prescriptions     Pending Prescriptions Disp Refills   . lancets (One Touch Delica) 33 gauge misc 100 Lancet 3     Sig: Monitor blood sugar once daily

## 2021-01-15 ENCOUNTER — Encounter

## 2021-01-15 MED ORDER — OZEMPIC 0.25 MG OR 0.5 MG (2 MG/1.5 ML) SUBCUTANEOUS PEN INJECTOR
0.25 mg or 0.5 mg(2 mg/1.5 mL) | SUBCUTANEOUS | 3 refills | Status: AC
Start: 2021-01-15 — End: ?

## 2021-02-06 ENCOUNTER — Encounter: Attending: Neurology | Primary: Family Medicine

## 2021-03-04 ENCOUNTER — Encounter: Payer: Self-pay | Admitting: Internal Medicine

## 2021-03-13 ENCOUNTER — Ambulatory Visit: Payer: Self-pay | Admitting: Internal Medicine

## 2021-03-26 ENCOUNTER — Ambulatory Visit: Payer: Self-pay | Admitting: Internal Medicine

## 2021-04-19 ENCOUNTER — Ambulatory Visit: Payer: 59 | Admitting: Internal Medicine

## 2021-04-19 ENCOUNTER — Other Ambulatory Visit: Payer: Self-pay

## 2021-04-19 ENCOUNTER — Telehealth: Payer: Self-pay

## 2021-04-19 ENCOUNTER — Encounter: Payer: Self-pay | Admitting: Internal Medicine

## 2021-04-19 VITALS — BP 113/67 | HR 114 | Temp 98.3°F | Resp 18 | Ht 62.0 in | Wt 205.8 lb

## 2021-04-19 DIAGNOSIS — K900 Celiac disease: Secondary | ICD-10-CM | POA: Diagnosis not present

## 2021-04-19 DIAGNOSIS — Z1231 Encounter for screening mammogram for malignant neoplasm of breast: Secondary | ICD-10-CM

## 2021-04-19 DIAGNOSIS — Z7689 Persons encountering health services in other specified circumstances: Secondary | ICD-10-CM

## 2021-04-19 DIAGNOSIS — F418 Other specified anxiety disorders: Secondary | ICD-10-CM

## 2021-04-19 DIAGNOSIS — Z1159 Encounter for screening for other viral diseases: Secondary | ICD-10-CM

## 2021-04-19 DIAGNOSIS — G4733 Obstructive sleep apnea (adult) (pediatric): Secondary | ICD-10-CM

## 2021-04-19 DIAGNOSIS — M810 Age-related osteoporosis without current pathological fracture: Secondary | ICD-10-CM

## 2021-04-19 DIAGNOSIS — J4540 Moderate persistent asthma, uncomplicated: Secondary | ICD-10-CM

## 2021-04-19 DIAGNOSIS — Z0100 Encounter for examination of eyes and vision without abnormal findings: Secondary | ICD-10-CM

## 2021-04-19 DIAGNOSIS — E119 Type 2 diabetes mellitus without complications: Secondary | ICD-10-CM

## 2021-04-19 DIAGNOSIS — G43019 Migraine without aura, intractable, without status migrainosus: Secondary | ICD-10-CM | POA: Diagnosis not present

## 2021-04-19 DIAGNOSIS — F339 Major depressive disorder, recurrent, unspecified: Secondary | ICD-10-CM | POA: Insufficient documentation

## 2021-04-19 DIAGNOSIS — Z780 Asymptomatic menopausal state: Secondary | ICD-10-CM

## 2021-04-19 DIAGNOSIS — E1140 Type 2 diabetes mellitus with diabetic neuropathy, unspecified: Secondary | ICD-10-CM

## 2021-04-19 DIAGNOSIS — I10 Essential (primary) hypertension: Secondary | ICD-10-CM

## 2021-04-19 MED ORDER — AIMOVIG 140 MG/ML ~~LOC~~ SOAJ: 140.0000 mg | SUBCUTANEOUS | 0 refills | Status: DC

## 2021-04-19 NOTE — Assessment & Plan Note (Signed)
Uncontrolled On Ozempic, used to follow Endocrinology in [place], local referral provided On statin and ACEi Diabetic eye exam: Referred to Ophthalmology

## 2021-04-19 NOTE — Assessment & Plan Note (Addendum)
On Aimovig, refilled for now - will to do [place] Zolmitriptan as needed for breakthrough headache Used to follow-up with neurology in [place] Referred to local Neurologist

## 2021-04-19 NOTE — Progress Notes (Signed)
New Patient Office Visit  Subjective:  Patient ID: Monique Warren, female    DOB: 10-09-1950  Age: 70 y.o. MRN: [id number]  CC:  Chief Complaint  Patient presents with   New Patient (Initial Visit)    New patient she just moved here from [place] she is is having lower back pain that has been going on for years but is really bad today     HPI Monique Warren is a 70 year old female with past medical history of hypertension, cerebral artery aneurysm s/p clip, migraine, asthma, OSA, celiac disease, GERD, type II DM, osteoporosis, depression with anxiety and obesity who presents for establishing care.  HTN: BP is well-controlled. Takes medications regularly. Patient denies headache, dizziness, chest pain, dyspnea or palpitations.  DM: Uncontrolled.  States that her blood glucose runs around 1 50-200 at home.  She is on Ozempic currently.  She used to see endocrinology in [place].  She asked for a local referral.  Denies any polyuria, polyphagia or fatigue.  Migraine: [facility], has ran out of it recently.  Takes zolmitriptan as needed for breakthrough headache.  Request local neurology referral.  Osteoporosis: Follows up with rheumatology in [place].  Gets Prolia regularly.  Celiac disease: Used to follow-up with GI in [place].  Requests a local referral.  Takes probiotics.  Takes rabeprazole for GERD.  She has had colonoscopy and EGD in 2020.  Last mammography in 01/2020.  She has had 2 doses of COVID-vaccine.  History reviewed. No pertinent past medical history.  History reviewed. No pertinent surgical history.  History reviewed. No pertinent family history.  Social History   Socioeconomic History   Marital status: Married    Spouse name: Not on file   Number of children: Not on file   Years of education: Not on file   Highest education level: Not on file  Occupational History   Not on file  Tobacco Use   Smoking status: Former    Packs/day: 0.50    Types:  Cigarettes   Smokeless tobacco: Never  Substance and Sexual Activity   Alcohol use: Yes    Alcohol/week: 4.0 standard drinks    Types: 2 Glasses of wine, 2 Shots of liquor per week    Comment: on the weekends   Drug use: Never   Sexual activity: Not on file  Other Topics Concern   Not on file  Social History Narrative   Not on file   Social Determinants of Health   Financial Resource Strain: Not on file  Food Insecurity: Not on file  Transportation Needs: Not on file  Physical Activity: Not on file  Stress: Not on file  Social Connections: Not on file  Intimate Partner Violence: Not on file    ROS Review of Systems  Constitutional:  Negative for chills and fever.  HENT:  Negative for congestion, sinus pressure, sinus pain and sore throat.   Eyes:  Negative for pain and discharge.  Respiratory:  Negative for cough and shortness of breath.   Cardiovascular:  Negative for chest pain and palpitations.  Gastrointestinal:  Negative for abdominal pain, constipation, diarrhea, nausea and vomiting.  Endocrine: Negative for polydipsia and polyuria.  Genitourinary:  Negative for dysuria and hematuria.  Musculoskeletal:  Positive for back pain. Negative for neck pain and neck stiffness.  Skin:  Negative for rash.  Neurological:  Positive for headaches. Negative for dizziness and weakness.  Psychiatric/Behavioral:  Negative for agitation and behavioral problems.    Objective:   Today's  Vitals: BP 113/67 (BP Location: Left Arm, Patient Position: Sitting, Cuff Size: Normal)   Pulse (!) 114   Temp 98.3 F (36.8 C) (Oral)   Resp 18   Ht '5\' 2"'  (1.575 m)   Wt 205 lb 12.8 oz (93.4 kg)   SpO2 97%   BMI 37.64 kg/m   Physical Exam Vitals reviewed.  Constitutional:      General: She is not in acute distress.    Appearance: She is obese. She is not diaphoretic.  HENT:     Head: Normocephalic and atraumatic.     Nose: Nose normal.     Mouth/Throat:     Mouth: Mucous membranes are  moist.  Eyes:     General: No scleral icterus.    Extraocular Movements: Extraocular movements intact.  Cardiovascular:     Rate and Rhythm: Normal rate and regular rhythm.     Pulses: Normal pulses.     Heart sounds: Murmur (Systolic over right upper sternal border) heard.  Pulmonary:     Breath sounds: Normal breath sounds. No wheezing or rales.  Abdominal:     Palpations: Abdomen is soft.     Tenderness: There is no abdominal tenderness.  Musculoskeletal:     Cervical back: Neck supple. No tenderness.     Right lower leg: No edema.     Left lower leg: No edema.  Skin:    General: Skin is warm.     Findings: No rash.  Neurological:     General: No focal deficit present.     Mental Status: She is alert and oriented to person, place, and time.  Psychiatric:        Mood and Affect: Mood normal.        Behavior: Behavior normal.    Assessment & Plan:   Problem List Items Addressed This Visit       Encounter to establish care - Primary   Care established History and medications reviewed with the patient      Relevant Orders  CBC with Differential/Platelet  CMP14+EGFR  TSH + free T4  Vitamin D (25 hydroxy)    Cardiovascular and Mediastinum   Migraine without aura or status migrainosus    On Aimovig, refilled for now - will to do PA Zolmitriptan as needed for breakthrough headache Used to follow-up with neurology in [place] Referred to local Neurologist       Relevant Medications   buPROPion (WELLBUTRIN XL) 300 MG 24 hr tablet   DULoxetine (CYMBALTA) 60 MG capsule   lisinopril (ZESTRIL) 10 MG tablet   zolmitriptan (ZOMIG-ZMT) 5 MG disintegrating tablet   atorvastatin (LIPITOR) 40 MG tablet   AIMOVIG 140 MG/ML SOAJ   Other Relevant Orders   Ambulatory referral to Neurology   HTN (hypertension)    BP Readings from Last 1 Encounters:  04/19/21 113/67  Well-controlled with lisinopril Counseled for compliance with the medications Advised DASH diet and  moderate exercise/walking, at least 150 mins/week       Relevant Medications   lisinopril (ZESTRIL) 10 MG tablet   atorvastatin (LIPITOR) 40 MG tablet     Respiratory   Asthma    Uses Flovent and PRN Albuterol Well-controlled currently       Relevant Medications   albuterol (VENTOLIN HFA) 108 (90 Base) MCG/ACT inhaler   fluticasone (FLOVENT HFA) 110 MCG/ACT inhaler   Obstructive sleep apnea    Uses CPAP, has difficulty with mask Referred to Neurology for OSA evaluation and migraine  Digestive   Celiac disease    Referred to GI On Rabeprazole for GERD       Relevant Orders   Ambulatory referral to Gastroenterology     Endocrine   Type 2 diabetes mellitus with diabetic neuropathy, unspecified ([place])    Uncontrolled On Ozempic, used to follow Endocrinology in [place], local referral provided On statin and ACEi Diabetic eye exam: Referred to Ophthalmology       Relevant Medications   lisinopril (ZESTRIL) 10 MG tablet   Semaglutide,0.25 or 0.5MG/DOS, (OZEMPIC, 0.25 OR 0.5 MG/DOSE,) 2 MG/1.5ML SOPN   atorvastatin (LIPITOR) 40 MG tablet     Musculoskeletal and Integument   Osteoporosis    Gets Prolia, followed by Rheumatology in [place]       Relevant Medications   PROLIA 60 MG/ML SOSY injection     Other      Depression with anxiety    Well-controlled On Wellbutrin and Cymbalta (for back pain) Also takes another medication, unknown detail currently - advised to contact with detail       Relevant Medications   buPROPion (WELLBUTRIN XL) 300 MG 24 hr tablet   DULoxetine (CYMBALTA) 60 MG capsule   Other Visit Diagnoses     Encounter for hepatitis C screening test for low risk patient       Relevant Orders   Hepatitis C Antibody   Diabetic eye exam ([place])       Relevant Medications   lisinopril (ZESTRIL) 10 MG tablet   Semaglutide,0.25 or 0.5MG/DOS, (OZEMPIC, 0.25 OR 0.5 MG/DOSE,) 2 MG/1.5ML SOPN   atorvastatin (LIPITOR) 40 MG tablet    Other Relevant Orders   Ambulatory referral to Ophthalmology   Post-menopausal       Relevant Orders   DG Bone Density   Screening mammogram for breast cancer       Relevant Orders   MM 3D SCREEN BREAST BILATERAL       Outpatient Encounter Medications as of 04/19/2021  Medication Sig   albuterol (VENTOLIN HFA) 108 (90 Base) MCG/ACT inhaler Inhale into the lungs.   atorvastatin (LIPITOR) 40 MG tablet Take 40 mg by mouth daily.   buPROPion (WELLBUTRIN XL) 300 MG 24 hr tablet Take 300 mg by mouth daily.   DULoxetine (CYMBALTA) 60 MG capsule Take 60 mg by mouth daily.   Estradiol (VAGIFEM) 10 MCG TABS vaginal tablet Place vaginally.   famotidine (PEPCID) 40 MG tablet Take 40 mg by mouth daily.   fluticasone (FLOVENT HFA) 110 MCG/ACT inhaler Inhale into the lungs 2 (two) times daily.   lisinopril (ZESTRIL) 10 MG tablet Take by mouth.   Probiotic Product (ENVIVE PO) Take by mouth.   PROLIA 60 MG/ML SOSY injection Inject into the skin.   RABEprazole (ACIPHEX) 20 MG tablet Take by mouth.   Semaglutide,0.25 or 0.5MG/DOS, (OZEMPIC, 0.25 OR 0.5 MG/DOSE,) 2 MG/1.5ML SOPN INJECT 0.5 MG UNDER THE SKIN WEEKLY   zolmitriptan (ZOMIG-ZMT) 5 MG disintegrating tablet Take by mouth.   [DISCONTINUED] AIMOVIG 140 MG/ML SOAJ Inject into the skin.   AIMOVIG 140 MG/ML SOAJ Inject 140 mg into the skin every 30 (thirty) days.   No facility-administered encounter medications on file as of 04/19/2021.    Follow-up: Return in about 3 months (around 07/20/2021) for DM and HTN.   Lindell Spar, MD

## 2021-04-19 NOTE — Assessment & Plan Note (Signed)
Gets Prolia, followed by Rheumatology in [place]

## 2021-04-19 NOTE — Assessment & Plan Note (Addendum)
Referred to GI On Rabeprazole for GERD

## 2021-04-19 NOTE — Assessment & Plan Note (Signed)
Uses Flovent and PRN Albuterol Well-controlled currently

## 2021-04-19 NOTE — Assessment & Plan Note (Signed)
Well-controlled On Wellbutrin and Cymbalta (for back pain) Also takes another medication, unknown detail currently - advised to contact with detail

## 2021-04-19 NOTE — Assessment & Plan Note (Signed)
BP Readings from Last 1 Encounters:  04/19/21 113/67   Well-controlled with lisinopril Counseled for compliance with the medications Advised DASH diet and moderate exercise/walking, at least 150 mins/week

## 2021-04-19 NOTE — Assessment & Plan Note (Signed)
Care established History and medications reviewed with the patient 

## 2021-04-19 NOTE — Patient Instructions (Signed)
Please continue taking medications as prescribed.  Please continue to follow low carb diet and perform moderate exercise/walking at least 150 mins/week.  You are being referred to Neurology for migraine and sleep apnea.  You are being referred to GI for Celiac disease.  You are being referred to Endocrinology for diabetes.

## 2021-04-19 NOTE — Telephone Encounter (Signed)
Awaiting medical records from [facility] from previous Endocrinology office.

## 2021-04-19 NOTE — Assessment & Plan Note (Signed)
Uses CPAP, has difficulty with mask Referred to Neurology for OSA evaluation and migraine 

## 2021-04-21 LAB — MICROALBUMIN, URINE: Microalbumin, Urine: 23.3 ug/mL

## 2021-04-23 ENCOUNTER — Other Ambulatory Visit: Payer: Self-pay | Admitting: *Deleted

## 2021-04-23 DIAGNOSIS — G43019 Migraine without aura, intractable, without status migrainosus: Secondary | ICD-10-CM

## 2021-04-23 MED ORDER — LISINOPRIL 10 MG PO TABS
20.0000 mg | ORAL_TABLET | Freq: Every day | ORAL | 0 refills | Status: DC
Start: 2021-04-23 — End: 2021-07-04

## 2021-04-23 MED ORDER — FAMOTIDINE 40 MG PO TABS: 40.0000 mg | ORAL_TABLET | Freq: Every day | ORAL | 1 refills | Status: DC

## 2021-04-23 MED ORDER — AIMOVIG 140 MG/ML ~~LOC~~ SOAJ: 140.0000 mg | SUBCUTANEOUS | 0 refills | Status: DC

## 2021-04-24 ENCOUNTER — Encounter: Payer: Self-pay | Admitting: Internal Medicine

## 2021-05-09 ENCOUNTER — Telehealth: Payer: Self-pay

## 2021-05-09 NOTE — Telephone Encounter (Signed)
Monique Warren, What is the status of this patient's records?

## 2021-05-14 NOTE — Telephone Encounter (Signed)
Pt is scheduled °

## 2021-05-14 NOTE — Telephone Encounter (Signed)
There are records uploaded in the chart for pt from previous provider

## 2021-05-16 DIAGNOSIS — E1142 Type 2 diabetes mellitus with diabetic polyneuropathy: Secondary | ICD-10-CM | POA: Insufficient documentation

## 2021-05-16 DIAGNOSIS — G5601 Carpal tunnel syndrome, right upper limb: Secondary | ICD-10-CM | POA: Insufficient documentation

## 2021-06-11 ENCOUNTER — Other Ambulatory Visit: Payer: Self-pay

## 2021-06-11 ENCOUNTER — Other Ambulatory Visit: Payer: Self-pay | Admitting: *Deleted

## 2021-06-11 ENCOUNTER — Ambulatory Visit (INDEPENDENT_AMBULATORY_CARE_PROVIDER_SITE_OTHER): Payer: 59 | Admitting: Nurse Practitioner

## 2021-06-11 ENCOUNTER — Encounter: Payer: Self-pay | Admitting: Nurse Practitioner

## 2021-06-11 VITALS — BP 149/79 | HR 99 | Ht 62.0 in | Wt 207.8 lb

## 2021-06-11 DIAGNOSIS — E782 Mixed hyperlipidemia: Secondary | ICD-10-CM

## 2021-06-11 DIAGNOSIS — R300 Dysuria: Secondary | ICD-10-CM

## 2021-06-11 DIAGNOSIS — I10 Essential (primary) hypertension: Secondary | ICD-10-CM

## 2021-06-11 DIAGNOSIS — E1140 Type 2 diabetes mellitus with diabetic neuropathy, unspecified: Secondary | ICD-10-CM

## 2021-06-11 LAB — POCT GLYCOSYLATED HEMOGLOBIN (HGB A1C): HbA1c, POC (controlled diabetic range): 7.6 % — AB (ref 0.0–7.0)

## 2021-06-11 NOTE — Patient Instructions (Signed)
Diabetes Mellitus and Nutrition, Adult When you have diabetes, or diabetes mellitus, it is very important to have healthy eating habits because your blood sugar (glucose) levels are greatly affected by what you eat and drink. Eating healthy foods in the right amounts, at about the same times every day, can help you:  Control your blood glucose.  Lower your risk of heart disease.  Improve your blood pressure.  Reach or maintain a healthy weight. What can affect my meal plan? Every person with diabetes is different, and each person has different needs for a meal plan. Your health care provider may recommend that you work with a dietitian to make a meal plan that is best for you. Your meal plan may vary depending on factors such as:  The calories you need.  The medicines you take.  Your weight.  Your blood glucose, blood pressure, and cholesterol levels.  Your activity level.  Other health conditions you have, such as heart or kidney disease. How do carbohydrates affect me? Carbohydrates, also called carbs, affect your blood glucose level more than any other type of food. Eating carbs naturally raises the amount of glucose in your blood. Carb counting is a method for keeping track of how many carbs you eat. Counting carbs is important to keep your blood glucose at a healthy level, especially if you use insulin or take certain oral diabetes medicines. It is important to know how many carbs you can safely have in each meal. This is different for every person. Your dietitian can help you calculate how many carbs you should have at each meal and for each snack. How does alcohol affect me? Alcohol can cause a sudden decrease in blood glucose (hypoglycemia), especially if you use insulin or take certain oral diabetes medicines. Hypoglycemia can be a life-threatening condition. Symptoms of hypoglycemia, such as sleepiness, dizziness, and confusion, are similar to symptoms of having too much  alcohol.  Do not drink alcohol if: ? Your health care provider tells you not to drink. ? You are pregnant, may be pregnant, or are planning to become pregnant.  If you drink alcohol: ? Do not drink on an empty stomach. ? Limit how much you use to:  0-1 drink a day for women.  0-2 drinks a day for men. ? Be aware of how much alcohol is in your drink. In the [place] one drink equals one 12 oz bottle of beer (355 mL), one 5 oz glass of wine (148 mL), or one 1 oz glass of hard liquor (44 mL). ? Keep yourself hydrated with water, diet soda, or unsweetened iced tea.  Keep in mind that regular soda, juice, and other mixers may contain a lot of sugar and must be counted as carbs. What are tips for following this plan? Reading food labels  Start by checking the serving size on the "Nutrition Facts" label of packaged foods and drinks. The amount of calories, carbs, fats, and other nutrients listed on the label is based on one serving of the item. Many items contain more than one serving per package.  Check the total grams (g) of carbs in one serving. You can calculate the number of servings of carbs in one serving by dividing the total carbs by 15. For example, if a food has 30 g of total carbs per serving, it would be equal to 2 servings of carbs.  Check the number of grams (g) of saturated fats and trans fats in one serving. Choose foods that have   a low amount or none of these fats.  Check the number of milligrams (mg) of salt (sodium) in one serving. Most people should limit total sodium intake to less than 2,300 mg per day.  Always check the nutrition information of foods labeled as "low-fat" or "nonfat." These foods may be higher in added sugar or refined carbs and should be avoided.  Talk to your dietitian to identify your daily goals for nutrients listed on the label. Shopping  Avoid buying canned, pre-made, or processed foods. These foods tend to be high in fat, sodium, and added  sugar.  Shop around the outside edge of the grocery store. This is where you will most often find fresh fruits and vegetables, bulk grains, fresh meats, and fresh dairy. Cooking  Use low-heat cooking methods, such as baking, instead of high-heat cooking methods like deep frying.  Cook using healthy oils, such as olive, canola, or sunflower oil.  Avoid cooking with butter, cream, or high-fat meats. Meal planning  Eat meals and snacks regularly, preferably at the same times every day. Avoid going long periods of time without eating.  Eat foods that are high in fiber, such as fresh fruits, vegetables, beans, and whole grains. Talk with your dietitian about how many servings of carbs you can eat at each meal.  Eat 4-6 oz (112-168 g) of lean protein each day, such as lean meat, chicken, fish, eggs, or tofu. One ounce (oz) of lean protein is equal to: ? 1 oz (28 g) of meat, chicken, or fish. ? 1 egg. ?  cup (62 g) of tofu.  Eat some foods each day that contain healthy fats, such as avocado, nuts, seeds, and fish.   What foods should I eat? Fruits Berries. Apples. Oranges. Peaches. Apricots. Plums. Grapes. Mango. Papaya. Pomegranate. Kiwi. Cherries. Vegetables Lettuce. Spinach. Leafy greens, including kale, chard, collard greens, and mustard greens. Beets. Cauliflower. Cabbage. Broccoli. Carrots. Green beans. Tomatoes. Peppers. Onions. Cucumbers. Brussels sprouts. Grains Whole grains, such as whole-wheat or whole-grain bread, crackers, tortillas, cereal, and pasta. Unsweetened oatmeal. Quinoa. Brown or wild rice. Meats and other proteins Seafood. Poultry without skin. Lean cuts of poultry and beef. Tofu. Nuts. Seeds. Dairy Low-fat or fat-free dairy products such as milk, yogurt, and cheese. The items listed above may not be a complete list of foods and beverages you can eat. Contact a dietitian for more information. What foods should I avoid? Fruits Fruits canned with  syrup. Vegetables Canned vegetables. Frozen vegetables with butter or cream sauce. Grains Refined white flour and flour products such as bread, pasta, snack foods, and cereals. Avoid all processed foods. Meats and other proteins Fatty cuts of meat. Poultry with skin. Breaded or fried meats. Processed meat. Avoid saturated fats. Dairy Full-fat yogurt, cheese, or milk. Beverages Sweetened drinks, such as soda or iced tea. The items listed above may not be a complete list of foods and beverages you should avoid. Contact a dietitian for more information. Questions to ask a health care provider  Do I need to meet with a diabetes educator?  Do I need to meet with a dietitian?  What number can I call if I have questions?  When are the best times to check my blood glucose? Where to find more information:  American Diabetes Association: diabetes.org  Academy of Nutrition and Dietetics: www.eatright.org  National Institute of Diabetes and Digestive and Kidney Diseases: www.niddk.nih.gov  Association of Diabetes Care and Education Specialists: www.diabeteseducator.org Summary  It is important to have healthy eating   habits because your blood sugar (glucose) levels are greatly affected by what you eat and drink.  A healthy meal plan will help you control your blood glucose and maintain a healthy lifestyle.  Your health care provider may recommend that you work with a dietitian to make a meal plan that is best for you.  Keep in mind that carbohydrates (carbs) and alcohol have immediate effects on your blood glucose levels. It is important to count carbs and to use alcohol carefully. This information is not intended to replace advice given to you by your health care provider. Make sure you discuss any questions you have with your health care provider. Document Revised: 08/10/2019 Document Reviewed: 08/10/2019 Elsevier Patient Education  2021 [employer]  

## 2021-06-11 NOTE — Progress Notes (Signed)
Endocrinology Consult Note       06/11/2021, 4:11 PM   Subjective:    Patient ID: Monique Warren, female    DOB: 04-04-1951.  Monique Warren is being seen in consultation for management of currently uncontrolled symptomatic diabetes requested by  Anabel Halon, MD.   Past Medical History:  Diagnosis Date   Arthritis    Asthma    Celiac disease    Diabetes mellitus, type II (HCC)    Diabetic neuropathy (HCC)    Heart murmur    Hiatal hernia    Sliding   Hyperlipidemia    Hypertension    Migraine    Obstructive sleep apnea    Osteoporosis    Vertigo     Past Surgical History:  Procedure Laterality Date   ABDOMINAL HYSTERECTOMY  09/29/2003   APPENDECTOMY     CARPAL TUNNEL RELEASE     CATARACT EXTRACTION Bilateral    October and November 2016   CRANIOTOMY FOR ANEURYSM / VERTEBROBASILAR / CAROTID CIRCULATION Right 10/24/2015   LAPAROSCOPY     UTERINE FIBROID SURGERY      Social History   Socioeconomic History   Marital status: Married    Spouse name: Not on file   Number of children: Not on file   Years of education: Not on file   Highest education level: Not on file  Occupational History   Not on file  Tobacco Use   Smoking status: Former    Packs/day: 0.50    Types: Cigarettes   Smokeless tobacco: Never  Vaping Use   Vaping Use: Never used  Substance and Sexual Activity   Alcohol use: Yes    Alcohol/week: 4.0 standard drinks    Types: 2 Glasses of wine, 2 Shots of liquor per week    Comment: on the weekends   Drug use: Never   Sexual activity: Not on file  Other Topics Concern   Not on file  Social History Narrative   Not on file   Social Determinants of Health   Financial Resource Strain: Not on file  Food Insecurity: Not on file  Transportation Needs: Not on file  Physical Activity: Not on file  Stress: Not on file  Social Connections: Not on file    Family  History  Problem Relation Age of Onset   Thyroid disease Mother    Stroke Mother    Hypertension Father    Hyperlipidemia Father    Heart attack Father    Heart failure Father     Outpatient Encounter Medications as of 06/11/2021  Medication Sig   AIMOVIG 140 MG/ML SOAJ Inject 140 mg into the skin every 30 (thirty) days.   albuterol (VENTOLIN HFA) 108 (90 Base) MCG/ACT inhaler Inhale into the lungs.   atorvastatin (LIPITOR) 40 MG tablet Take 40 mg by mouth daily.   buPROPion (WELLBUTRIN XL) 300 MG 24 hr tablet Take 300 mg by mouth daily.   busPIRone (BUSPAR) 10 MG tablet Take 10 mg by mouth 2 (two) times daily.   DULoxetine (CYMBALTA) 60 MG capsule Take 60 mg by mouth daily.   Estradiol 10 MCG TABS vaginal tablet Place vaginally.   famotidine (PEPCID) 40  MG tablet Take 1 tablet (40 mg total) by mouth daily.   fluticasone (FLOVENT HFA) 110 MCG/ACT inhaler Inhale into the lungs 2 (two) times daily.   lidocaine (XYLOCAINE) 5 % ointment lidocaine 5 % topical ointment   lisinopril (ZESTRIL) 20 MG tablet Take 20 mg by mouth daily.   Probiotic Product (ENVIVE PO) Take by mouth.   PROLIA 60 MG/ML SOSY injection Inject into the skin.   RABEprazole (ACIPHEX) 20 MG tablet Take by mouth.   Semaglutide,0.25 or 0.5MG /DOS, (OZEMPIC, 0.25 OR 0.5 MG/DOSE,) 2 MG/1.5ML SOPN INJECT 0.5 MG UNDER THE SKIN WEEKLY   zolmitriptan (ZOMIG-ZMT) 5 MG disintegrating tablet Take by mouth.   lisinopril (ZESTRIL) 10 MG tablet Take 2 tablets (20 mg total) by mouth daily. (Patient not taking: Reported on 06/11/2021)   No facility-administered encounter medications on file as of 06/11/2021.    ALLERGIES: Allergies  Allergen Reactions   Cefuroxime Anaphylaxis, Anxiety, Hives, Shortness Of Breath and Swelling    Throat Throat Throat Throat almost closed off Throat Throat Throat    Codeine Anxiety, Hives, Nausea And Vomiting, Shortness Of Breath and Swelling    Throat Throat throat throat Throat Throat     Sulfamethoxazole-Trimethoprim Anxiety, Shortness Of Breath and Swelling    Throat swelling Throat swelling    Sulfa Antibiotics Swelling    Swollen throat    VACCINATION STATUS: Immunization History  Administered Date(s) Administered   Influenza Split 07/26/2014   Influenza, High Dose Seasonal PF 09/22/2018   PFIZER(Purple Top)SARS-COV-2 Vaccination 12/01/2019, 12/29/2019    Diabetes She presents for her initial diabetic visit. She has type 2 diabetes mellitus. Onset time: Diagnosed at approx age of 71. Her disease course has been stable. There are no hypoglycemic associated symptoms. Associated symptoms include foot paresthesias and polyuria. There are no hypoglycemic complications. Symptoms are stable. Diabetic complications include nephropathy and peripheral neuropathy. Risk factors for coronary artery disease include diabetes mellitus, dyslipidemia, family history, hypertension, obesity, post-menopausal and sedentary lifestyle. Current diabetic treatments: Ozempic 0.5 mg SQ weekly. She is compliant with treatment most of the time. Her weight is stable. She is following a generally healthy diet. When asked about meal planning, she reported none. She has not had a previous visit with a dietitian. She participates in exercise intermittently. Her home blood glucose trend is fluctuating minimally. (She presents today for her consultation with no meter or logs to review.  Her POCT A1c today is 7.6%, increasing from last reading of 6.4% in Feb 2022.  She routinely monitors glucose once daily, when she remembers.  She drinks mostly water and coffee with some creamer and eats typically 2 meals per day with snacks in between.  She does not engage in routine physical activity.  She denies any recent hypoglycemia.) An ACE inhibitor/angiotensin II receptor blocker is being taken. She does not see a podiatrist.Eye exam is current.  Hyperlipidemia This is a chronic problem. The current episode started more  than 1 year ago. The problem is controlled. Recent lipid tests were reviewed and are normal. Exacerbating diseases include chronic renal disease, diabetes and obesity. Current antihyperlipidemic treatment includes statins. The current treatment provides moderate improvement of lipids. There are no compliance problems.  Risk factors for coronary artery disease include diabetes mellitus, dyslipidemia, family history, obesity, hypertension, post-menopausal and a sedentary lifestyle.  Hypertension This is a chronic problem. The current episode started more than 1 year ago. The problem has been waxing and waning since onset. The problem is controlled. There are no associated agents  to hypertension. Risk factors for coronary artery disease include diabetes mellitus, dyslipidemia, family history, obesity, post-menopausal state and sedentary lifestyle. Past treatments include ACE inhibitors. The current treatment provides moderate improvement. There are no compliance problems.  Hypertensive end-organ damage includes kidney disease. Identifiable causes of hypertension include chronic renal disease and sleep apnea.    Review of systems  Constitutional: + Minimally fluctuating body weight, current Body mass index is 38.01 kg/m., no fatigue, no subjective hyperthermia, no subjective hypothermia Eyes: no blurry vision, no xerophthalmia ENT: no sore throat, no nodules palpated in throat, no dysphagia/odynophagia, no hoarseness Cardiovascular: no chest pain, no shortness of breath, no palpitations, no leg swelling Respiratory: no cough, no shortness of breath Gastrointestinal: no nausea/vomiting/diarrhea Musculoskeletal: no muscle/joint aches Skin: no rashes, no hyperemia Neurological: no tremors, + numbness/tingling to BLE, no dizziness Psychiatric: no depression, no anxiety  Objective:     BP (!) 149/79   Pulse 99   Ht 5\' 2"  (1.575 m)   Wt 207 lb 12.8 oz (94.3 kg)   BMI 38.01 kg/m   Wt Readings from  Last 3 Encounters:  06/11/21 207 lb 12.8 oz (94.3 kg)  04/19/21 205 lb 12.8 oz (93.4 kg)     BP Readings from Last 3 Encounters:  06/11/21 (!) 149/79  04/19/21 113/67     Physical Exam- Limited  Constitutional:  Body mass index is 38.01 kg/m. , not in acute distress, normal state of mind Eyes:  EOMI, no exophthalmos Neck: Supple Cardiovascular: RRR, + murmur, rubs, or gallops, no edema Respiratory: Adequate breathing efforts, no crackles, rales, rhonchi, or wheezing Musculoskeletal: no gross deformities, strength intact in all four extremities, no gross restriction of joint movements Skin:  no rashes, no hyperemia Neurological: no tremor with outstretched hands    CMP ( most recent) CMP  No results found for: NA, K, CL, CO2, GLUCOSE, BUN, CREATININE, CALCIUM, PROT, ALBUMIN, AST, ALT, ALKPHOS, BILITOT, GFRNONAA, GFRAA   Diabetic Labs (most recent): Lab Results  Component Value Date   HGBA1C 7.6 (A) 06/11/2021     Lipid Panel ( most recent) Lipid Panel  No results found for: CHOL, TRIG, HDL, CHOLHDL, VLDL, LDLCALC, LDLDIRECT, LABVLDL    No results found for: TSH, FREET4         Assessment & Plan:   1) Type 2 diabetes mellitus with diabetic neuropathy, without long-term current use of insulin (HCC)  She presents today for her consultation with no meter or logs to review.  Her POCT A1c today is 7.6%, increasing from last reading of 6.4% in Feb 2022.  She routinely monitors glucose once daily, when she remembers.  She drinks mostly water and coffee with some creamer and eats typically 2 meals per day with snacks in between.  She does not engage in routine physical activity.  She denies any recent hypoglycemia.  - Monique Warren has currently uncontrolled symptomatic type 2 DM since 70 years of age, with most recent A1c of 7.6 %.   -Recent labs reviewed.  - I had a long discussion with her about the progressive nature of diabetes and the pathology behind its  complications. -her diabetes is complicated by mild CKD, peripheral neuropathy and she remains at a high risk for more acute and chronic complications which include CAD, CVA, CKD, retinopathy, and neuropathy. These are all discussed in detail with her.  - I have counseled her on diet and weight management by adopting a carbohydrate restricted/protein rich diet. Patient is encouraged to switch to unprocessed  or minimally processed complex starch and increased protein intake (animal or plant source), fruits, and vegetables. -  she is advised to stick to a routine mealtimes to eat 3 meals a day and avoid unnecessary snacks (to snack only to correct hypoglycemia).   - she acknowledges that there is a room for improvement in her food and drink choices. - Suggestion is made for her to avoid simple carbohydrates from her diet including Cakes, Sweet Desserts, Ice Cream, Soda (diet and regular), Sweet Tea, Candies, Chips, Cookies, Store Bought Juices, Alcohol in Excess of 1-2 drinks a day, Artificial Sweeteners, Coffee Creamer, and "Sugar-free" Products. This will help patient to have more stable blood glucose profile and potentially avoid unintended weight gain.  -She declines referral to RDE at this time.  Has seen one in the past and did not find it helpful.  - I have approached her with the following individualized plan to manage her diabetes and patient agrees:   -She is advised to continue Ozempic 0.5 mg SQ weekly.  May consider increasing this dose on subsequent visits if triglycerides are not too high.  -she is encouraged to start monitoring glucose 4 times daily, before meals and before bed, to log their readings on the clinic sheets provided, and bring them to review at follow up appointment in 2 weeks.  - Adjustment parameters are given to her for hypo and hyperglycemia in writing. - she is encouraged to call clinic for blood glucose levels less than 70 or above 300 mg /dl.  - She does not  tolerate Metformin due to GI upset (r/t her Celiac disease) and has allergy to sulfa meds.  - Specific targets for  A1c; LDL, HDL, and Triglycerides were discussed with the patient.  2) Blood Pressure /Hypertension:  her blood pressure is controlled to target.   she is advised to continue her current medications including Lisinopril 10 mg p.o. daily with breakfast.  3) Lipids/Hyperlipidemia:    There is no recent lipid panel to review.  She is advised to continue Lipitor 40 mg po daily at bedtime.  Side effects and precautions discussed with her.  Will recheck lipids on subsequent visits.   4)  Weight/Diet:  her Body mass index is 38.01 kg/m.  -  clearly complicating her diabetes care.   she is a candidate for weight loss. I discussed with her the fact that loss of 5 - 10% of her  current body weight will have the most impact on her diabetes management.  Exercise, and detailed carbohydrates information provided  -  detailed on discharge instructions.  5) Chronic Care/Health Maintenance: -she is on ACEI/ARB and Statin medications and is encouraged to initiate and continue to follow up with Ophthalmology, Dentist, Podiatrist at least yearly or according to recommendations, and advised to stay away from smoking. I have recommended yearly flu vaccine and pneumonia vaccine at least every 5 years; moderate intensity exercise for up to 150 minutes weekly; and sleep for at least 7 hours a day.  - she is advised to maintain close follow up with Anabel Halon, MD for primary care needs, as well as her other providers for optimal and coordinated care.   - Time spent in this patient care: 60 min, of which > 50% was spent in counseling her about her diabetes and the rest reviewing her blood glucose logs, discussing her hypoglycemia and hyperglycemia episodes, reviewing her current and previous labs/studies (including abstraction from other facilities) and medications doses and developing a long  term  treatment plan based on the latest standards of care/guidelines; and documenting her care.    Please refer to Patient Instructions for Blood Glucose Monitoring and Insulin/Medications Dosing Guide" in media tab for additional information. Please also refer to "Patient Self Inventory" in the Media tab for reviewed elements of pertinent patient history.  Monique Warren participated in the discussions, expressed understanding, and voiced agreement with the above plans.  All questions were answered to her satisfaction. she is encouraged to contact clinic should she have any questions or concerns prior to her return visit.     Follow up plan: - Return in about 2 weeks (around 06/25/2021) for Diabetes F/U, Bring meter and logs.    Ronny Bacon, [facility] [address] [place], [place] [address] Phone: [phone] Fax: [phone]  06/11/2021, 4:11 PM

## 2021-06-12 ENCOUNTER — Ambulatory Visit (INDEPENDENT_AMBULATORY_CARE_PROVIDER_SITE_OTHER): Payer: 59 | Admitting: Internal Medicine

## 2021-06-12 ENCOUNTER — Encounter: Payer: Self-pay | Admitting: Internal Medicine

## 2021-06-12 DIAGNOSIS — N3000 Acute cystitis without hematuria: Secondary | ICD-10-CM | POA: Diagnosis not present

## 2021-06-12 DIAGNOSIS — R300 Dysuria: Secondary | ICD-10-CM

## 2021-06-12 DIAGNOSIS — R42 Dizziness and giddiness: Secondary | ICD-10-CM | POA: Insufficient documentation

## 2021-06-12 DIAGNOSIS — E119 Type 2 diabetes mellitus without complications: Secondary | ICD-10-CM | POA: Insufficient documentation

## 2021-06-12 DIAGNOSIS — K469 Unspecified abdominal hernia without obstruction or gangrene: Secondary | ICD-10-CM | POA: Insufficient documentation

## 2021-06-12 LAB — POCT URINALYSIS DIP (CLINITEK)
Bilirubin, UA: NEGATIVE
Blood, UA: NEGATIVE
Glucose, UA: NEGATIVE mg/dL
Nitrite, UA: NEGATIVE
POC PROTEIN,UA: NEGATIVE
Spec Grav, UA: 1.025 (ref 1.010–1.025)
Urobilinogen, UA: 0.2 E.U./dL
pH, UA: 5.5 (ref 5.0–8.0)

## 2021-06-12 MED ORDER — NITROFURANTOIN MONOHYD MACRO 100 MG PO CAPS: 100.0000 mg | ORAL_CAPSULE | Freq: Two times a day (BID) | ORAL | 0 refills | Status: DC

## 2021-06-12 NOTE — Progress Notes (Signed)
Virtual Visit via Telephone Note   This visit type was conducted due to national recommendations for restrictions regarding the COVID-19 Pandemic (e.g. social distancing) in an effort to limit this patient's exposure and mitigate transmission in our community.  Due to her co-morbid illnesses, this patient is at least at moderate risk for complications without adequate follow up.  This format is felt to be most appropriate for this patient at this time.  The patient did not have access to video technology/had technical difficulties with video requiring transitioning to audio format only (telephone).  All issues noted in this document were discussed and addressed.  No physical exam could be performed with this format.  Please refer to the patient's chart for her  consent to telehealth for [facility].   Evaluation Performed:  Follow-up visit  Date:  06/12/2021   ID:  Monique Warren, Monique Warren 1951-05-01, MRN [id number]  Patient Location: Home Provider Location: Office/Clinic  Location of Patient: Home Location of Provider: Telehealth Consent was obtain for visit to be over via telehealth. I verified that I am speaking with the correct person using two identifiers.  PCP:  Anabel Halon, MD   Chief Complaint:  Dysuria  History of Present Illness:    Monique Warren is a 70 y.o. female who has got televisit for complaint of dysuria and lower abdominal pulling sensation while urinating for last 2 weeks, for which she tried taking Azo with no relief.  Denies any hematuria or flank pain. She denies any fever, chills, nausea or vomiting.  Denies any vaginal discharge or bleeding.  The patient does not have symptoms concerning for COVID-19 infection (fever, chills, cough, or new shortness of breath).   Past Medical, Surgical, Social History, Allergies, and Medications have been Reviewed.  Past Medical History:  Diagnosis Date   Arthritis    Asthma    Celiac disease    Diabetes  mellitus, type II (HCC)    Diabetic neuropathy (HCC)    Heart murmur    Hiatal hernia    Sliding   Hyperlipidemia    Hypertension    Migraine    Obstructive sleep apnea    Osteoporosis    Vertigo    Past Surgical History:  Procedure Laterality Date   ABDOMINAL HYSTERECTOMY  09/29/2003   APPENDECTOMY     CARPAL TUNNEL RELEASE     CATARACT EXTRACTION Bilateral    October and November 2016   CRANIOTOMY FOR ANEURYSM / VERTEBROBASILAR / CAROTID CIRCULATION Right 10/24/2015   LAPAROSCOPY     UTERINE FIBROID SURGERY       Current Meds  Medication Sig   AIMOVIG 140 MG/ML SOAJ Inject 140 mg into the skin every 30 (thirty) days.   albuterol (VENTOLIN HFA) 108 (90 Base) MCG/ACT inhaler Inhale into the lungs.   atorvastatin (LIPITOR) 40 MG tablet Take 40 mg by mouth daily.   buPROPion (WELLBUTRIN XL) 300 MG 24 hr tablet Take 300 mg by mouth daily.   busPIRone (BUSPAR) 10 MG tablet Take 10 mg by mouth 2 (two) times daily.   DULoxetine (CYMBALTA) 60 MG capsule Take 60 mg by mouth daily.   Estradiol 10 MCG TABS vaginal tablet Place vaginally.   famotidine (PEPCID) 40 MG tablet Take 1 tablet (40 mg total) by mouth daily.   fluticasone (FLOVENT HFA) 110 MCG/ACT inhaler Inhale into the lungs 2 (two) times daily.   lidocaine (XYLOCAINE) 5 % ointment lidocaine 5 % topical ointment   lisinopril (ZESTRIL) 10  MG tablet Take 2 tablets (20 mg total) by mouth daily.   lisinopril (ZESTRIL) 20 MG tablet Take 20 mg by mouth daily.   nitrofurantoin, macrocrystal-monohydrate, (MACROBID) 100 MG capsule Take 1 capsule (100 mg total) by mouth 2 (two) times daily.   Probiotic Product (ENVIVE PO) Take by mouth.   PROLIA 60 MG/ML SOSY injection Inject into the skin.   RABEprazole (ACIPHEX) 20 MG tablet Take by mouth.   Semaglutide,0.25 or 0.5MG /DOS, (OZEMPIC, 0.25 OR 0.5 MG/DOSE,) 2 MG/1.5ML SOPN INJECT 0.5 MG UNDER THE SKIN WEEKLY   zolmitriptan (ZOMIG-ZMT) 5 MG disintegrating tablet Take by mouth.      Allergies:   Cefuroxime, Codeine, Sulfamethoxazole-trimethoprim, and Sulfa antibiotics   ROS:   Please see the history of present illness.     All other systems reviewed and are negative.   Labs/Other Tests and Data Reviewed:    Recent Labs: No results found for requested labs within last 8760 hours.   Recent Lipid Panel No results found for: CHOL, TRIG, HDL, CHOLHDL, LDLCALC, LDLDIRECT  Wt Readings from Last 3 Encounters:  06/11/21 207 lb 12.8 oz (94.3 kg)  04/19/21 205 lb 12.8 oz (93.4 kg)     ASSESSMENT & PLAN:    UTI UA reviewed Check urine culture Started Macrobid empirically Advised to increase fluid intake  Time:   Today, I have spent 9 minutes reviewing the chart, including problem list, medications, and with the patient with telehealth technology discussing the above problems.   Medication Adjustments/Labs and Tests Ordered: Current medicines are reviewed at length with the patient today.  Concerns regarding medicines are outlined above.   Tests Ordered: Orders Placed This Encounter  Procedures   POCT URINALYSIS DIP (CLINITEK)    Medication Changes: Meds ordered this encounter  Medications   nitrofurantoin, macrocrystal-monohydrate, (MACROBID) 100 MG capsule    Sig: Take 1 capsule (100 mg total) by mouth 2 (two) times daily.    Dispense:  10 capsule    Refill:  0     Note: This dictation was prepared with Dragon dictation along with smaller phrase technology. Similar sounding words can be transcribed inadequately or may not be corrected upon review. Any transcriptional errors that result from this process are unintentional.      Disposition:  Follow up  Signed, Anabel Halon, MD  06/12/2021 10:43 AM     [facility]

## 2021-06-17 LAB — URINE CULTURE

## 2021-06-20 ENCOUNTER — Other Ambulatory Visit: Payer: Self-pay | Admitting: Internal Medicine

## 2021-06-20 DIAGNOSIS — G43019 Migraine without aura, intractable, without status migrainosus: Secondary | ICD-10-CM

## 2021-06-26 ENCOUNTER — Ambulatory Visit: Payer: 59 | Admitting: Nurse Practitioner

## 2021-07-02 ENCOUNTER — Ambulatory Visit (INDEPENDENT_AMBULATORY_CARE_PROVIDER_SITE_OTHER): Payer: 59 | Admitting: Internal Medicine

## 2021-07-02 ENCOUNTER — Other Ambulatory Visit: Payer: Self-pay

## 2021-07-02 ENCOUNTER — Encounter: Payer: Self-pay | Admitting: Internal Medicine

## 2021-07-02 VITALS — BP 127/80 | HR 93 | Temp 98.3°F | Ht 62.0 in | Wt 206.1 lb

## 2021-07-02 DIAGNOSIS — I10 Essential (primary) hypertension: Secondary | ICD-10-CM

## 2021-07-02 DIAGNOSIS — R079 Chest pain, unspecified: Secondary | ICD-10-CM | POA: Diagnosis not present

## 2021-07-02 DIAGNOSIS — G4733 Obstructive sleep apnea (adult) (pediatric): Secondary | ICD-10-CM | POA: Diagnosis not present

## 2021-07-02 DIAGNOSIS — Z23 Encounter for immunization: Secondary | ICD-10-CM | POA: Diagnosis not present

## 2021-07-02 DIAGNOSIS — F418 Other specified anxiety disorders: Secondary | ICD-10-CM

## 2021-07-02 MED ORDER — BUSPIRONE HCL 10 MG PO TABS: 10.0000 mg | ORAL_TABLET | Freq: Three times a day (TID) | ORAL | 1 refills | Status: DC

## 2021-07-02 MED ORDER — BUPROPION HCL ER (XL) 300 MG PO TB24: 300.0000 mg | ORAL_TABLET | Freq: Every day | ORAL | 1 refills | Status: DC

## 2021-07-02 NOTE — Patient Instructions (Signed)
Please continue to take medications as prescribed.  Please try to use CPAP regularly.  You are being referred to Cardiology.

## 2021-07-02 NOTE — Progress Notes (Signed)
Established Patient Office Visit  Subjective:  Patient ID: Monique Warren, female    DOB: 1951/04/24  Age: 70 y.o. MRN: [id number]  CC:  Chief Complaint  Patient presents with   Acute Visit    Patient would like to be referred to Cardiologist. She has had right upper chest pain which radiates around to he back. Previous PCP recommended her seeing Cards prior to her moving here. Has hx of Heart murmur. Passed out x 4 weeks ago on the way to the vet.    HPI Monique Warren  is a 70 year old female with past medical history of hypertension, cerebral artery aneurysm s/p clip, migraine, asthma, OSA, celiac disease, GERD, type II DM, osteoporosis, depression with anxiety and obesity who presents with c/o chest pain and an episode of passing out.  She has been having episodes of chest pain, midsternal, unrelated to activity, dull, lasting for few minutes to few hours. She was advised to see Cardiologist by her previous PCP, but she had to move from that area.  She also has mild dyspnea during these episodes. She had an episode of passing out while driving. Of note, she has OSA and has not been using her CPAP regularly and sometimes her dog removes the mask from her face.  Her BP was elevated today. She appears to be anxious today. She denies any dizziness or chest pain currently.  Past Medical History:  Diagnosis Date   Arthritis    Asthma    Celiac disease    Diabetes mellitus, type II ([place])    Diabetic neuropathy ([place])    Heart murmur    Hiatal hernia    Sliding   Hyperlipidemia    Hypertension    Migraine    Obstructive sleep apnea    Osteoporosis    Vertigo     Past Surgical History:  Procedure Laterality Date   ABDOMINAL HYSTERECTOMY  09/29/2003   APPENDECTOMY     CARPAL TUNNEL RELEASE     CATARACT EXTRACTION Bilateral    October and November 2016   CRANIOTOMY FOR ANEURYSM / VERTEBROBASILAR / CAROTID CIRCULATION Right 10/24/2015   LAPAROSCOPY     UTERINE FIBROID  SURGERY      Family History  Problem Relation Age of Onset   Thyroid disease Mother    Stroke Mother    Hypertension Father    Hyperlipidemia Father    Heart attack Father    Heart failure Father     Social History   Socioeconomic History   Marital status: Married    Spouse name: Not on file   Number of children: Not on file   Years of education: Not on file   Highest education level: Not on file  Occupational History   Not on file  Tobacco Use   Smoking status: Former    Packs/day: 0.50    Types: Cigarettes   Smokeless tobacco: Never  Vaping Use   Vaping Use: Never used  Substance and Sexual Activity   Alcohol use: Yes    Alcohol/week: 4.0 standard drinks    Types: 2 Glasses of wine, 2 Shots of liquor per week    Comment: on the weekends   Drug use: Never   Sexual activity: Not on file  Other Topics Concern   Not on file  Social History Narrative   Not on file   Social Determinants of Health   Financial Resource Strain: Not on file  Food Insecurity: Not on file  Transportation Needs: Not  on file  Physical Activity: Not on file  Stress: Not on file  Social Connections: Not on file  Intimate Partner Violence: Not on file    Outpatient Medications Prior to Visit  Medication Sig Dispense Refill   AIMOVIG 140 MG/ML SOAJ INJECT 140 MG UNDER THE SKIN EVERY 30 DAYS 1 mL 11   albuterol (VENTOLIN HFA) 108 (90 Base) MCG/ACT inhaler Inhale into the lungs.     atorvastatin (LIPITOR) 40 MG tablet Take 40 mg by mouth daily.     DULoxetine (CYMBALTA) 60 MG capsule Take 60 mg by mouth daily.     Estradiol 10 MCG TABS vaginal tablet Place vaginally.     famotidine (PEPCID) 40 MG tablet Take 1 tablet (40 mg total) by mouth daily. 90 tablet 1   fluticasone (FLOVENT HFA) 110 MCG/ACT inhaler Inhale into the lungs 2 (two) times daily.     lidocaine (XYLOCAINE) 5 % ointment lidocaine 5 % topical ointment     lisinopril (ZESTRIL) 20 MG tablet Take 20 mg by mouth daily.      Probiotic Product (ENVIVE PO) Take by mouth.     PROLIA 60 MG/ML SOSY injection Inject into the skin.     zolmitriptan (ZOMIG-ZMT) 5 MG disintegrating tablet Take by mouth.     buPROPion (WELLBUTRIN XL) 300 MG 24 hr tablet Take 300 mg by mouth daily.     busPIRone (BUSPAR) 10 MG tablet Take 10 mg by mouth 2 (two) times daily.     pantoprazole (PROTONIX) 40 MG tablet Take 40 mg by mouth daily.     RABEprazole (ACIPHEX) 20 MG tablet Take by mouth.     Semaglutide,0.25 or 0.5MG/DOS, (OZEMPIC, 0.25 OR 0.5 MG/DOSE,) 2 MG/1.5ML SOPN INJECT 0.5 MG UNDER THE SKIN WEEKLY     lisinopril (ZESTRIL) 10 MG tablet Take 2 tablets (20 mg total) by mouth daily. 180 tablet 0   nitrofurantoin, macrocrystal-monohydrate, (MACROBID) 100 MG capsule Take 1 capsule (100 mg total) by mouth 2 (two) times daily. 10 capsule 0   No facility-administered medications prior to visit.    Allergies  Allergen Reactions   Cefuroxime Anaphylaxis, Anxiety, Hives, Shortness Of Breath and Swelling    Throat Throat Throat Throat almost closed off Throat Throat Throat    Codeine Anxiety, Hives, Nausea And Vomiting, Shortness Of Breath and Swelling    Throat Throat throat throat Throat Throat    Sulfamethoxazole-Trimethoprim Anxiety, Shortness Of Breath and Swelling    Throat swelling Throat swelling    Sulfa Antibiotics Swelling    Swollen throat    ROS Review of Systems  Constitutional:  Negative for chills and fever.  HENT:  Negative for congestion, sinus pressure, sinus pain and sore throat.   Eyes:  Negative for pain and discharge.  Respiratory:  Negative for cough and shortness of breath.   Cardiovascular:  Positive for chest pain. Negative for palpitations.  Gastrointestinal:  Negative for abdominal pain, constipation, diarrhea, nausea and vomiting.  Endocrine: Negative for polydipsia and polyuria.  Genitourinary:  Negative for dysuria and hematuria.  Musculoskeletal:  Positive for back pain. Negative  for neck pain and neck stiffness.  Skin:  Negative for rash.  Neurological:  Positive for headaches. Negative for dizziness and weakness.  Psychiatric/Behavioral:  Negative for agitation and behavioral problems.      Objective:    Physical Exam Vitals reviewed.  Constitutional:      General: She is not in acute distress.    Appearance: She is obese. She is not diaphoretic.  HENT:     Head: Normocephalic and atraumatic.     Nose: Nose normal.     Mouth/Throat:     Mouth: Mucous membranes are moist.  Eyes:     General: No scleral icterus.    Extraocular Movements: Extraocular movements intact.  Cardiovascular:     Rate and Rhythm: Normal rate and regular rhythm.     Pulses: Normal pulses.     Heart sounds: Murmur (Systolic over right upper sternal border) heard.  Pulmonary:     Breath sounds: Normal breath sounds. No wheezing or rales.  Abdominal:     Palpations: Abdomen is soft.     Tenderness: There is no abdominal tenderness.  Musculoskeletal:     Cervical back: Neck supple. No tenderness.     Right lower leg: No edema.     Left lower leg: No edema.  Skin:    General: Skin is warm.     Findings: No rash.  Neurological:     General: No focal deficit present.     Mental Status: She is alert and oriented to person, place, and time.  Psychiatric:        Mood and Affect: Mood normal.        Behavior: Behavior normal.    BP 127/80 (BP Location: Left Arm, Patient Position: Sitting, Cuff Size: Large)   Pulse 93   Temp 98.3 F (36.8 C) (Oral)   Ht '5\' 2"'  (1.575 m)   Wt 206 lb 1.3 oz (93.5 kg)   SpO2 97%   BMI 37.69 kg/m  Wt Readings from Last 3 Encounters:  07/04/21 208 lb 3.2 oz (94.4 kg)  07/02/21 206 lb 1.3 oz (93.5 kg)  06/11/21 207 lb 12.8 oz (94.3 kg)     Health Maintenance Due  Topic Date Due   Pneumonia Vaccine 33+ Years old (1 - PCV) Never done   OPHTHALMOLOGY EXAM  Never done   Hepatitis C Screening  Never done   TETANUS/TDAP  Never done    COLONOSCOPY (Pts 45-24yr Insurance coverage will need to be confirmed)  Never done   MAMMOGRAM  Never done   Zoster Vaccines- Shingrix (1 of 2) Never done   COVID-19 Vaccine (3 - Booster for Pfizer series) 02/23/2020    There are no preventive care reminders to display for this patient.  No results found for: TSH No results found for: WBC, HGB, HCT, MCV, PLT No results found for: NA, K, CHLORIDE, CO2, GLUCOSE, BUN, CREATININE, BILITOT, ALKPHOS, AST, ALT, PROT, ALBUMIN, CALCIUM, ANIONGAP, EGFR, GFR No results found for: CHOL No results found for: HDL No results found for: LDLCALC No results found for: TRIG No results found for: CHOLHDL Lab Results  Component Value Date   HGBA1C 7.6 (A) 06/11/2021      Assessment & Plan:   Problem List Items Addressed This Visit       Chest pain   Intermittent, unrelated to activity  EKG: Sinus rhythm. HR 89. LVH. Nonspecific T wave inversions. No signs of active ischemia.  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    Dispense:  90 tablet    Refill:  1   busPIRone (BUSPAR) 10 MG tablet    Sig: Take 1 tablet (10 mg total) by mouth 3 (three) times daily.    Dispense:  90 tablet    Refill:  1    Follow-up: Return in about 3 months (around 10/02/2021).    Lindell Spar, MD

## 2021-07-02 NOTE — Assessment & Plan Note (Signed)
Intermittent, unrelated to activity  EKG: Sinus rhythm. HR 89. LVH. Nonspecific T wave inversions. No signs of active ischemia.  Referred to Cardiology

## 2021-07-04 ENCOUNTER — Encounter: Payer: Self-pay | Admitting: Nurse Practitioner

## 2021-07-04 ENCOUNTER — Other Ambulatory Visit: Payer: Self-pay | Admitting: Internal Medicine

## 2021-07-04 ENCOUNTER — Other Ambulatory Visit: Payer: Self-pay

## 2021-07-04 ENCOUNTER — Ambulatory Visit (INDEPENDENT_AMBULATORY_CARE_PROVIDER_SITE_OTHER): Payer: 59 | Admitting: Nurse Practitioner

## 2021-07-04 VITALS — BP 158/84 | HR 99 | Ht 62.0 in | Wt 208.2 lb

## 2021-07-04 DIAGNOSIS — I10 Essential (primary) hypertension: Secondary | ICD-10-CM | POA: Diagnosis not present

## 2021-07-04 DIAGNOSIS — E1140 Type 2 diabetes mellitus with diabetic neuropathy, unspecified: Secondary | ICD-10-CM | POA: Diagnosis not present

## 2021-07-04 DIAGNOSIS — E782 Mixed hyperlipidemia: Secondary | ICD-10-CM | POA: Diagnosis not present

## 2021-07-04 LAB — ABI
Left ABI: 1.13
Right ABI: 1.16

## 2021-07-04 MED ORDER — SEMAGLUTIDE (1 MG/DOSE) 4 MG/3ML ~~LOC~~ SOPN: 1.0000 mg | PEN_INJECTOR | SUBCUTANEOUS | 3 refills | Status: DC

## 2021-07-04 NOTE — Progress Notes (Signed)
Endocrinology Follow Up Note       07/04/2021, 3:55 PM   Subjective:    Patient ID: Monique Warren, female    DOB: [birth year].  Monique Warren is being seen in follow up after being seen in consultation for management of currently uncontrolled symptomatic diabetes requested by  Anabel Halon, MD.   Past Medical History:  Diagnosis Date   Arthritis    Asthma    Celiac disease    Diabetes mellitus, type II (HCC)    Diabetic neuropathy (HCC)    Heart murmur    Hiatal hernia    Sliding   Hyperlipidemia    Hypertension    Migraine    Obstructive sleep apnea    Osteoporosis    Vertigo     Past Surgical History:  Procedure Laterality Date   ABDOMINAL HYSTERECTOMY  09/29/2003   APPENDECTOMY     CARPAL TUNNEL RELEASE     CATARACT EXTRACTION Bilateral    October and November 2016   CRANIOTOMY FOR ANEURYSM / VERTEBROBASILAR / CAROTID CIRCULATION Right 10/24/2015   LAPAROSCOPY     UTERINE FIBROID SURGERY      Social History   Socioeconomic History   Marital status: Married    Spouse name: Not on file   Number of children: Not on file   Years of education: Not on file   Highest education level: Not on file  Occupational History   Not on file  Tobacco Use   Smoking status: Former    Packs/day: 0.50    Types: Cigarettes   Smokeless tobacco: Never  Vaping Use   Vaping Use: Never used  Substance and Sexual Activity   Alcohol use: Yes    Alcohol/week: 4.0 standard drinks    Types: 2 Glasses of wine, 2 Shots of liquor per week    Comment: on the weekends   Drug use: Never   Sexual activity: Not on file  Other Topics Concern   Not on file  Social History Narrative   Not on file   Social Determinants of Health   Financial Resource Strain: Not on file  Food Insecurity: Not on file  Transportation Needs: Not on file  Physical Activity: Not on file  Stress: Not on file  Social  Connections: Not on file    Family History  Problem Relation Age of Onset   Thyroid disease Mother    Stroke Mother    Hypertension Father    Hyperlipidemia Father    Heart attack Father    Heart failure Father     Outpatient Encounter Medications as of 07/04/2021  Medication Sig   AIMOVIG 140 MG/ML SOAJ INJECT 140 MG UNDER THE SKIN EVERY 30 DAYS   albuterol (VENTOLIN HFA) 108 (90 Base) MCG/ACT inhaler Inhale into the lungs.   atorvastatin (LIPITOR) 40 MG tablet Take 40 mg by mouth daily.   buPROPion (WELLBUTRIN XL) 300 MG 24 hr tablet Take 1 tablet (300 mg total) by mouth daily.   busPIRone (BUSPAR) 10 MG tablet Take 1 tablet (10 mg total) by mouth 3 (three) times daily.   DULoxetine (CYMBALTA) 60 MG capsule Take 60 mg by mouth daily.   Estradiol  10 MCG TABS vaginal tablet Place vaginally.   famotidine (PEPCID) 40 MG tablet Take 1 tablet (40 mg total) by mouth daily.   fluticasone (FLOVENT HFA) 110 MCG/ACT inhaler Inhale into the lungs 2 (two) times daily.   lidocaine (XYLOCAINE) 5 % ointment lidocaine 5 % topical ointment   lisinopril (ZESTRIL) 10 MG tablet TAKE 2 TABLETS DAILY   lisinopril (ZESTRIL) 20 MG tablet Take 20 mg by mouth daily.   Probiotic Product (ENVIVE PO) Take by mouth.   PROLIA 60 MG/ML SOSY injection Inject into the skin.   Semaglutide, 1 MG/DOSE, 4 MG/3ML SOPN Inject 1 mg as directed once a week.   zolmitriptan (ZOMIG-ZMT) 5 MG disintegrating tablet Take by mouth.   [DISCONTINUED] Semaglutide,0.25 or 0.5MG /DOS, (OZEMPIC, 0.25 OR 0.5 MG/DOSE,) 2 MG/1.5ML SOPN INJECT 0.5 MG UNDER THE SKIN WEEKLY   [DISCONTINUED] lisinopril (ZESTRIL) 10 MG tablet Take 2 tablets (20 mg total) by mouth daily.   No facility-administered encounter medications on file as of 07/04/2021.    ALLERGIES: Allergies  Allergen Reactions   Cefuroxime Anaphylaxis, Anxiety, Hives, Shortness Of Breath and Swelling    Throat Throat Throat Throat almost closed off Throat Throat Throat     Codeine Anxiety, Hives, Nausea And Vomiting, Shortness Of Breath and Swelling    Throat Throat throat throat Throat Throat    Sulfamethoxazole-Trimethoprim Anxiety, Shortness Of Breath and Swelling    Throat swelling Throat swelling    Sulfa Antibiotics Swelling    Swollen throat    VACCINATION STATUS: Immunization History  Administered Date(s) Administered   Fluad Quad(high Dose 65+) 07/02/2021   Influenza Split 07/26/2014   Influenza, High Dose Seasonal PF 09/22/2018   PFIZER(Purple Top)SARS-COV-2 Vaccination 12/01/2019, 12/29/2019    Diabetes She presents for her follow-up diabetic visit. She has type 2 diabetes mellitus. Onset time: Diagnosed at approx age of 70. Her disease course has been stable. There are no hypoglycemic associated symptoms. Associated symptoms include foot paresthesias. Pertinent negatives for diabetes include no polyuria. There are no hypoglycemic complications. Symptoms are stable. Diabetic complications include nephropathy and peripheral neuropathy. Risk factors for coronary artery disease include diabetes mellitus, dyslipidemia, family history, hypertension, obesity, post-menopausal and sedentary lifestyle. Current diabetic treatments: Ozempic 0.5 mg SQ weekly. She is compliant with treatment most of the time. Her weight is stable. She is following a generally healthy diet. When asked about meal planning, she reported none. She has not had a previous visit with a dietitian. She participates in exercise intermittently. Her home blood glucose trend is fluctuating minimally. (She presents today with her meter and logs showing above target fasting and postprandial glycemic profile.  She is not due for another A1c today.  She is working on her diet and exercise and denies any hypoglycemia.  She is not having any nausea as a result of the Ozempic.  Analysis of her meter shows 7-day average of 182, 14-day average of 196.) An ACE inhibitor/angiotensin II receptor  blocker is being taken. She does not see a podiatrist.Eye exam is current.  Hyperlipidemia This is a chronic problem. The current episode started more than 1 year ago. The problem is controlled. Recent lipid tests were reviewed and are normal. Exacerbating diseases include chronic renal disease, diabetes and obesity. Current antihyperlipidemic treatment includes statins. The current treatment provides moderate improvement of lipids. There are no compliance problems.  Risk factors for coronary artery disease include diabetes mellitus, dyslipidemia, family history, obesity, hypertension, post-menopausal and a sedentary lifestyle.  Hypertension This is a chronic problem.  The current episode started more than 1 year ago. The problem has been waxing and waning since onset. The problem is controlled. There are no associated agents to hypertension. Risk factors for coronary artery disease include diabetes mellitus, dyslipidemia, family history, obesity, post-menopausal state and sedentary lifestyle. Past treatments include ACE inhibitors. The current treatment provides moderate improvement. There are no compliance problems.  Hypertensive end-organ damage includes kidney disease. Identifiable causes of hypertension include chronic renal disease and sleep apnea.    Review of systems  Constitutional: + Minimally fluctuating body weight, current Body mass index is 38.08 kg/m., no fatigue, no subjective hyperthermia, no subjective hypothermia Eyes: no blurry vision, no xerophthalmia ENT: no sore throat, no nodules palpated in throat, no dysphagia/odynophagia, no hoarseness Cardiovascular: no chest pain, no shortness of breath, no palpitations, no leg swelling Respiratory: no cough, no shortness of breath Gastrointestinal: no nausea/vomiting/diarrhea Musculoskeletal: no muscle/joint aches Skin: no rashes, no hyperemia Neurological: no tremors, + numbness/tingling to BLE, no dizziness Psychiatric: no  depression, no anxiety  Objective:     BP (!) 158/84   Pulse 99   Ht 5\' 2"  (1.575 m)   Wt 208 lb 3.2 oz (94.4 kg)   BMI 38.08 kg/m   Wt Readings from Last 3 Encounters:  07/04/21 208 lb 3.2 oz (94.4 kg)  07/02/21 206 lb 1.3 oz (93.5 kg)  06/11/21 207 lb 12.8 oz (94.3 kg)     BP Readings from Last 3 Encounters:  07/04/21 (!) 158/84  07/02/21 127/80  06/11/21 (!) 149/79     Physical Exam- Limited  Constitutional:  Body mass index is 38.08 kg/m. , not in acute distress, normal state of mind Eyes:  EOMI, no exophthalmos Neck: Supple Cardiovascular: RRR, + murmur, rubs, or gallops, no edema Respiratory: Adequate breathing efforts, no crackles, rales, rhonchi, or wheezing Musculoskeletal: no gross deformities, strength intact in all four extremities, no gross restriction of joint movements Skin:  no rashes, no hyperemia Neurological: no tremor with outstretched hands    POCT ABI Results 07/04/21   Right ABI:  1.16      Left ABI:  1.13  Right leg systolic / diastolic: 183/83 mmHg Left leg systolic / diastolic: 179/83 mmHg  Arm systolic / diastolic: 158/84 mmHG  Detailed report will be scanned into patient chart.   Diabetic Foot Exam - Simple   Simple Foot Form Diabetic Foot exam was performed with the following findings: Yes 07/04/2021  3:55 PM  Visual Inspection See comments: Yes Sensation Testing See comments: Yes Pulse Check Posterior Tibialis and Dorsalis pulse intact bilaterally: Yes Comments Toenails in need of trim (especially 5th digit on left foot- nail has nearly grown into the posterior side of toe).  No sensation to monofilament testing bilaterally     CMP ( most recent) CMP  No results found for: NA, K, CL, CO2, GLUCOSE, BUN, CREATININE, CALCIUM, PROT, ALBUMIN, AST, ALT, ALKPHOS, BILITOT, GFRNONAA, GFRAA   Diabetic Labs (most recent): Lab Results  Component Value Date   HGBA1C 7.6 (A) 06/11/2021     Lipid Panel ( most recent) Lipid  Panel  No results found for: CHOL, TRIG, HDL, CHOLHDL, VLDL, LDLCALC, LDLDIRECT, LABVLDL    No results found for: TSH, FREET4         Assessment & Plan:   1) Type 2 diabetes mellitus with diabetic neuropathy, without long-term current use of insulin (HCC)  She presents today with her meter and logs showing above target fasting and postprandial glycemic profile.  She is  not due for another A1c today.  She is working on her diet and exercise and denies any hypoglycemia.  She is not having any nausea as a result of the Ozempic.  Analysis of her meter shows 7-day average of 182, 14-day average of 196.  - Monique Warren has currently uncontrolled symptomatic type 2 DM since 70 years of age, with most recent A1c of 7.6 %.   -Recent labs reviewed.  - I had a long discussion with her about the progressive nature of diabetes and the pathology behind its complications. -her diabetes is complicated by mild CKD, peripheral neuropathy and she remains at a high risk for more acute and chronic complications which include CAD, CVA, CKD, retinopathy, and neuropathy. These are all discussed in detail with her.  - Nutritional counseling repeated at each appointment due to patients tendency to fall back in to old habits.  - The patient admits there is a room for improvement in their diet and drink choices. -  Suggestion is made for the patient to avoid simple carbohydrates from their diet including Cakes, Sweet Desserts / Pastries, Ice Cream, Soda (diet and regular), Sweet Tea, Candies, Chips, Cookies, Sweet Pastries, Store Bought Juices, Alcohol in Excess of 1-2 drinks a day, Artificial Sweeteners, Coffee Creamer, and "Sugar-free" Products. This will help patient to have stable blood glucose profile and potentially avoid unintended weight gain.   - I encouraged the patient to switch to unprocessed or minimally processed complex starch and increased protein intake (animal or plant source), fruits, and  vegetables.   - Patient is advised to stick to a routine mealtimes to eat 3 meals a day and avoid unnecessary snacks (to snack only to correct hypoglycemia).  - I have approached her with the following individualized plan to manage her diabetes and patient agrees:   -She is advised to increase her Ozempic to 1 mg SQ weekly (can double up on her current dose until she depletes her supply).  May consider increasing this dose on subsequent visits if triglycerides are not too high.  -she is encouraged to continue monitoring blood glucose once daily, before breakfast and to call the clinic if she has readings less than 70 or above 300 for 3 tests in a row.  - She does not tolerate Metformin due to GI upset (r/t her Celiac disease) and has allergy to sulfa meds.  - Specific targets for  A1c; LDL, HDL, and Triglycerides were discussed with the patient.  2) Blood Pressure /Hypertension:  her blood pressure is controlled to target.   she is advised to continue her current medications including Lisinopril 20 mg p.o. daily with breakfast.  3) Lipids/Hyperlipidemia:    There is no recent lipid panel to review.  She is advised to continue Lipitor 40 mg po daily at bedtime.  Side effects and precautions discussed with her.  Will recheck lipids prior to next visit.  4)  Weight/Diet:  her Body mass index is 38.08 kg/m.  -  clearly complicating her diabetes care.   she is a candidate for weight loss. I discussed with her the fact that loss of 5 - 10% of her  current body weight will have the most impact on her diabetes management.  Exercise, and detailed carbohydrates information provided  -  detailed on discharge instructions.  5) Chronic Care/Health Maintenance: -she is on ACEI/ARB and Statin medications and is encouraged to initiate and continue to follow up with Ophthalmology, Dentist, Podiatrist at least yearly or according to  recommendations, and advised to stay away from smoking. I have recommended  yearly flu vaccine and pneumonia vaccine at least every 5 years; moderate intensity exercise for up to 150 minutes weekly; and sleep for at least 7 hours a day.  - she is advised to maintain close follow up with Anabel Halon, MD for primary care needs, as well as her other providers for optimal and coordinated care.     I spent 30 minutes in the care of the patient today including review of labs from CMP, Lipids, Thyroid Function, Hematology (current and previous including abstractions from other facilities); face-to-face time discussing  her blood glucose readings/logs, discussing hypoglycemia and hyperglycemia episodes and symptoms, medications doses, her options of short and long term treatment based on the latest standards of care / guidelines;  discussion about incorporating lifestyle medicine;  and documenting the encounter.    Please refer to Patient Instructions for Blood Glucose Monitoring and Insulin/Medications Dosing Guide"  in media tab for additional information. Please  also refer to " Patient Self Inventory" in the Media  tab for reviewed elements of pertinent patient history.  Monique Warren participated in the discussions, expressed understanding, and voiced agreement with the above plans.  All questions were answered to her satisfaction. she is encouraged to contact clinic should she have any questions or concerns prior to her return visit.     Follow up plan: - Return in about 3 months (around 10/04/2021) for Diabetes F/U with A1c in office, Previsit labs, Bring meter and logs.    Ronny Bacon, [facility] [address] [place], [place] [address] Phone: [phone] Fax: [phone]  07/04/2021, 3:55 PM

## 2021-07-04 NOTE — Patient Instructions (Signed)

## 2021-07-06 NOTE — Assessment & Plan Note (Signed)
Well-controlled On Wellbutrin and Cymbalta (for back pain) On BuSpar 10 mg TID

## 2021-07-06 NOTE — Assessment & Plan Note (Signed)
BP Readings from Last 1 Encounters:  07/04/21 (!) 158/84   Elevated today, appears anxious today Usually well-controlled with lisinopril Counseled for compliance with the medications Advised DASH diet and moderate exercise/walking, at least 150 mins/week

## 2021-07-06 NOTE — Assessment & Plan Note (Addendum)
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

## 2021-07-18 ENCOUNTER — Ambulatory Visit: Payer: 59 | Admitting: Internal Medicine

## 2021-07-23 ENCOUNTER — Ambulatory Visit (HOSPITAL_COMMUNITY)
Admission: RE | Admit: 2021-07-23 | Discharge: 2021-07-23 | Disposition: A | Discharge status: Home / Self Care | Payer: 59 | Source: Ambulatory Visit | Attending: Internal Medicine | Admitting: Internal Medicine

## 2021-07-23 ENCOUNTER — Encounter (HOSPITAL_COMMUNITY): Payer: Self-pay

## 2021-07-23 ENCOUNTER — Other Ambulatory Visit: Payer: Self-pay

## 2021-07-23 DIAGNOSIS — Z780 Asymptomatic menopausal state: Secondary | ICD-10-CM | POA: Insufficient documentation

## 2021-07-23 DIAGNOSIS — M85852 Other specified disorders of bone density and structure, left thigh: Secondary | ICD-10-CM | POA: Diagnosis not present

## 2021-07-23 DIAGNOSIS — Z13820 Encounter for screening for osteoporosis: Secondary | ICD-10-CM | POA: Diagnosis present

## 2021-07-23 DIAGNOSIS — E559 Vitamin D deficiency, unspecified: Secondary | ICD-10-CM | POA: Insufficient documentation

## 2021-07-23 DIAGNOSIS — Z1231 Encounter for screening mammogram for malignant neoplasm of breast: Secondary | ICD-10-CM

## 2021-07-31 ENCOUNTER — Emergency Department (HOSPITAL_COMMUNITY)
Admission: EM | Admit: 2021-07-31 | Discharge: 2021-07-31 | Disposition: A | Discharge status: Home / Self Care | Payer: 59 | Attending: Emergency Medicine | Admitting: Emergency Medicine

## 2021-07-31 ENCOUNTER — Emergency Department (HOSPITAL_COMMUNITY): Payer: 59

## 2021-07-31 ENCOUNTER — Encounter (HOSPITAL_COMMUNITY): Payer: Self-pay | Admitting: *Deleted

## 2021-07-31 DIAGNOSIS — J45909 Unspecified asthma, uncomplicated: Secondary | ICD-10-CM | POA: Diagnosis not present

## 2021-07-31 DIAGNOSIS — E1140 Type 2 diabetes mellitus with diabetic neuropathy, unspecified: Secondary | ICD-10-CM | POA: Diagnosis not present

## 2021-07-31 DIAGNOSIS — I10 Essential (primary) hypertension: Secondary | ICD-10-CM | POA: Diagnosis not present

## 2021-07-31 DIAGNOSIS — Z87891 Personal history of nicotine dependence: Secondary | ICD-10-CM | POA: Diagnosis not present

## 2021-07-31 DIAGNOSIS — S60221A Contusion of right hand, initial encounter: Secondary | ICD-10-CM | POA: Diagnosis not present

## 2021-07-31 DIAGNOSIS — W548XXA Other contact with dog, initial encounter: Secondary | ICD-10-CM | POA: Diagnosis not present

## 2021-07-31 DIAGNOSIS — Z79899 Other long term (current) drug therapy: Secondary | ICD-10-CM | POA: Insufficient documentation

## 2021-07-31 DIAGNOSIS — S6991XA Unspecified injury of right wrist, hand and finger(s), initial encounter: Secondary | ICD-10-CM | POA: Diagnosis present

## 2021-07-31 NOTE — ED Triage Notes (Signed)
Pulled down by her dog, swelling right wrist, may have hit head, pain in forehead

## 2021-07-31 NOTE — ED Provider Notes (Signed)
[facility] EMERGENCY DEPARTMENT Provider Note   CSN: [id number] Arrival date & time: 07/31/21  1555     History Chief Complaint  Patient presents with   Monique Warren is a 70 y.o. female.  Patient fell and injured her right hand.  The history is provided by the patient and medical records. No language interpreter was used.  Fall This is a new problem. The current episode started 6 to 12 hours ago. The problem occurs rarely. The problem has been resolved. Pertinent negatives include no chest pain, no abdominal pain and no headaches. Nothing aggravates the symptoms. Nothing relieves the symptoms. She has tried nothing for the symptoms.      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   Polyneuropathy due to type 2 diabetes mellitus ([place]) 05/16/2021   Depression with anxiety 04/19/2021   HTN (hypertension) 04/19/2021   BPPV (benign paroxysmal positional vertigo), right 09/25/2019   Celiac disease 09/25/2019   Obstructive sleep apnea 09/25/2019   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   Hyperlipidemia 10/16/2015   Arthritis 09/27/2015   Asthma 09/27/2015   Migraine without aura or status migrainosus 09/27/2015   Other intervertebral disc degeneration, lumbosacral region 01/26/2014   Wrist fracture 11/06/2012   Osteoporosis 09/16/2008   Type 2 diabetes mellitus with diabetic neuropathy, unspecified ([place]) 09/16/2008   Sliding hiatal hernia 1951/07/05   Heart murmur of  newborn 1951/03/16    Past Surgical History:  Procedure Laterality Date   ABDOMINAL HYSTERECTOMY  09/29/2003   APPENDECTOMY     BREAST BIOPSY Left    CARPAL TUNNEL RELEASE     CATARACT EXTRACTION Bilateral    October and November 2016   CRANIOTOMY FOR ANEURYSM / VERTEBROBASILAR / CAROTID CIRCULATION Right 10/24/2015   LAPAROSCOPY     UTERINE FIBROID SURGERY       OB History   No obstetric history on file.     Family History  Problem Relation Age of Onset   Breast cancer Mother    Thyroid disease Mother    Stroke Mother    Hypertension Father    Hyperlipidemia Father    Heart attack Father    Heart failure Father    Breast cancer Paternal Grandmother     Social History   Tobacco Use   Smoking status: Former    Packs/day: 0.50    Types: Cigarettes   Smokeless tobacco: Never  Vaping Use   Vaping Use: Never used  Substance Use Topics   Alcohol use: Yes    Alcohol/week: 4.0 standard drinks    Types: 2 Glasses of wine, 2 Shots of liquor per week    Comment: on the weekends   Drug use: Never    Home Medications Prior to Admission medications   Medication Sig Start Date End Date Taking? Authorizing Provider  AIMOVIG 140 MG/ML  SOAJ INJECT 140 MG UNDER THE SKIN EVERY 30 DAYS 06/20/21   Anabel Halon, MD  albuterol (VENTOLIN HFA) 108 (90 Base) MCG/ACT inhaler Inhale into the lungs. 04/07/21   [provider]  atorvastatin (LIPITOR) 40 MG tablet Take 40 mg by mouth daily.    [provider]  buPROPion (WELLBUTRIN XL) 300 MG 24 hr tablet Take 1 tablet (300 mg total) by mouth daily. 07/02/21   Anabel Halon, MD  busPIRone (BUSPAR) 10 MG tablet Take 1 tablet (10 mg total) by mouth 3 (three) times daily. 07/02/21   Anabel Halon, MD  DULoxetine (CYMBALTA) 60 MG capsule Take 60 mg by mouth daily. 12/04/20   [provider]  Estradiol 10 MCG TABS vaginal tablet Place vaginally.    [provider]  famotidine (PEPCID) 40 MG tablet Take 1  tablet (40 mg total) by mouth daily. 04/23/21   Anabel Halon, MD  fluticasone (FLOVENT HFA) 110 MCG/ACT inhaler Inhale into the lungs 2 (two) times daily.    [provider]  lidocaine (XYLOCAINE) 5 % ointment lidocaine 5 % topical ointment    [provider]  lisinopril (ZESTRIL) 10 MG tablet TAKE 2 TABLETS DAILY 07/04/21   Anabel Halon, MD  lisinopril (ZESTRIL) 20 MG tablet Take 20 mg by mouth daily.    [provider]  Probiotic Product (ENVIVE PO) Take by mouth.    [provider]  PROLIA 60 MG/ML SOSY injection Inject into the skin. 02/09/21   [provider]  Semaglutide, 1 MG/DOSE, 4 MG/3ML SOPN Inject 1 mg as directed once a week. 07/04/21   Dani Gobble, NP  zolmitriptan (ZOMIG-ZMT) 5 MG disintegrating tablet Take by mouth. 08/01/20   [provider]    Allergies    Cefuroxime, Codeine, Sulfamethoxazole-trimethoprim, and Sulfa antibiotics  Review of Systems   Review of Systems  Constitutional:  Negative for appetite change and fatigue.  HENT:  Negative for congestion, ear discharge and sinus pressure.   Eyes:  Negative for discharge.  Respiratory:  Negative for cough.   Cardiovascular:  Negative for chest pain.  Gastrointestinal:  Negative for abdominal pain and diarrhea.  Genitourinary:  Negative for frequency and hematuria.  Musculoskeletal:  Negative for back pain.       Right hand pain  Skin:  Negative for rash.  Neurological:  Negative for seizures and headaches.  Psychiatric/Behavioral:  Negative for hallucinations.    Physical Exam Updated Vital Signs BP 113/81 (BP Location: Left Arm)   Pulse 95   Temp 98.1 F (36.7 C) (Oral)   Resp 16   SpO2 100%   Physical Exam Vitals and nursing note reviewed.  Constitutional:      Appearance: She is well-developed.  HENT:     Head: Normocephalic.     Nose: Nose normal.  Eyes:     General: No scleral icterus.    Conjunctiva/sclera: Conjunctivae normal.   Neck:     Thyroid: No thyromegaly.  Cardiovascular:     Rate and Rhythm: Normal rate and regular rhythm.     Heart sounds: No murmur heard.   No friction rub. No gallop.  Pulmonary:     Breath sounds: No stridor. No wheezing or rales.  Chest:     Chest wall: No tenderness.  Abdominal:     General: There is no distension.     Tenderness: There is no abdominal tenderness. There is no rebound.  Musculoskeletal:  General: Normal range of motion.     Cervical back: Neck supple.     Comments: Tenderness posterior right hand especially over the proximal first metacarpal  Lymphadenopathy:     Cervical: No cervical adenopathy.  Skin:    Findings: No erythema or rash.  Neurological:     Mental Status: She is alert and oriented to person, place, and time.     Motor: No abnormal muscle tone.     Coordination: Coordination normal.  Psychiatric:        Behavior: Behavior normal.    ED Results / Procedures / Treatments   Labs (all labs ordered are listed, but only abnormal results are displayed) Labs Reviewed - No data to display  EKG None  Radiology DG Hand Complete Right  Result Date: 07/31/2021 CLINICAL DATA:  Fall. EXAM: RIGHT HAND - COMPLETE 3+ VIEW COMPARISON:  None. FINDINGS: There is no evidence of fracture or dislocation. There is degenerative narrowing and osteophyte formation at the first carpometacarpal joint. There is soft tissue swelling over the dorsal metacarpal heads. There is no radiopaque foreign body. IMPRESSION: No acute bony abnormality. Electronically Signed   By: Ronney Asters M.D.   On: 07/31/2021 17:52    Procedures Procedures   Medications Ordered in ED Medications - No data to display  ED Course  I have reviewed the triage vital signs and the nursing notes.  Pertinent labs & imaging results that were available during my care of the patient were reviewed by me and considered in my medical decision making (see chart for details).    MDM  Rules/Calculators/A&P                           Patient with contusion of right hand.  She will have her right hand splinted and will follow up with either Dr. Fredna Dow or Aline Brochure Final Clinical Impression(s) / ED Diagnoses Final diagnoses:  Contusion of right hand, initial encounter    Rx / DC Orders ED Discharge Orders     None        Milton Ferguson, MD 07/31/21 1916

## 2021-07-31 NOTE — Discharge Instructions (Addendum)
You can follow-up with Dr. Merlyn Lot in [place] or Dr. Romeo Apple and reasonable.  I would get follow-up the end of this week or beginning next week.  Take Tylenol or Motrin for pain.

## 2021-07-31 NOTE — ED Notes (Signed)
Patient left ED with ABCs intact, alert and oriented x4, respirations even and unlabored. Discharge instructions reviewed and all questions answered.   

## 2021-08-01 ENCOUNTER — Inpatient Hospital Stay
Admission: RE | Admit: 2021-08-01 | Discharge: 2021-08-01 | Disposition: A | Discharge status: Home / Self Care | Payer: Self-pay | Source: Ambulatory Visit | Attending: Internal Medicine | Admitting: Internal Medicine

## 2021-08-01 ENCOUNTER — Other Ambulatory Visit: Payer: Self-pay | Admitting: Internal Medicine

## 2021-08-01 DIAGNOSIS — Z1231 Encounter for screening mammogram for malignant neoplasm of breast: Secondary | ICD-10-CM

## 2021-08-08 ENCOUNTER — Encounter: Payer: Self-pay | Admitting: Orthopedic Surgery

## 2021-08-08 ENCOUNTER — Other Ambulatory Visit: Payer: Self-pay

## 2021-08-08 ENCOUNTER — Ambulatory Visit (INDEPENDENT_AMBULATORY_CARE_PROVIDER_SITE_OTHER): Payer: 59 | Admitting: Orthopedic Surgery

## 2021-08-08 DIAGNOSIS — M19032 Primary osteoarthritis, left wrist: Secondary | ICD-10-CM

## 2021-08-08 DIAGNOSIS — M19031 Primary osteoarthritis, right wrist: Secondary | ICD-10-CM

## 2021-08-08 NOTE — Progress Notes (Signed)
New Patient Visit  Assessment: Monique Warren is a 70 y.o. female with the following: 1. 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  She noticed bruising, and pain.  She is evaluated in the emergency department, and presents to clinic today.  Her pain is improved.  She does have some residual bruising, but this is also improving.  She has a history of bilateral CMC suspension arthroplasty, but is starting to experience some discomfort in this area.  Based on my review of the right hand x-ray, it looks like she has gone on to develop some residual arthritis.  As a result, I discussed referring her to see Dr. Frazier Butt for further evaluation.  She is interested in this, and we will place referral.  Follow-up in clinic as needed.   Follow-up: Return for Referral to Dr. Frazier Butt.  Subjective:  Chief Complaint  Patient presents with   New Patient (Initial Visit)   Hand Pain    RT hand/ seen in ED 07/31/21 DOI11/15/22 patient fell    History of Present Illness: Monique Warren is a 70 y.o. female who presents for evaluation of right hand pain.  Approximately 1 week ago, she was walking her dog, when she fell.  She tried to brace her fall with her right hand.  She noted immediate pain, swelling without obvious deformity.  She presented to the emergency department, where x-rays were negative.  Of note, she has a history of bilateral CMC suspension arthroplasty, and has done well until recently.  She notes pain in the base of her thumb as result.  She has been wearing a brace on the right wrist, but this causes some discomfort.     Review of Systems: No fevers or chills No numbness or tingling No chest pain No shortness of breath No bowel or bladder dysfunction No GI distress No headaches   Medical History:  Past Medical History:  Diagnosis Date   Arthritis    Asthma    Celiac disease    Diabetes  mellitus, type II (HCC)    Diabetic neuropathy (HCC)    Heart murmur    Hiatal hernia    Sliding   Hyperlipidemia    Hypertension    Migraine    Obstructive sleep apnea    Osteoporosis    Vertigo     Past Surgical History:  Procedure Laterality Date   ABDOMINAL HYSTERECTOMY  09/29/2003   APPENDECTOMY     BREAST BIOPSY Left    CARPAL TUNNEL RELEASE     CATARACT EXTRACTION Bilateral    October and November 2016   CRANIOTOMY FOR ANEURYSM / VERTEBROBASILAR / CAROTID CIRCULATION Right 10/24/2015   LAPAROSCOPY     UTERINE FIBROID SURGERY      Family History  Problem Relation Age of Onset   Breast cancer Mother    Thyroid disease Mother    Stroke Mother    Hypertension Father    Hyperlipidemia Father    Heart attack Father    Heart failure Father    Breast cancer Paternal Grandmother    Social History   Tobacco Use   Smoking status: Former    Packs/day: 0.50    Types: Cigarettes   Smokeless tobacco: Never  Vaping Use   Vaping Use: Never used  Substance Use Topics   Alcohol use: Yes    Alcohol/week: 4.0 standard drinks    Types: 2 Glasses of wine, 2 Shots of liquor per  week    Comment: on the weekends   Drug use: Never    Allergies  Allergen Reactions   Cefuroxime Anaphylaxis, Anxiety, Hives, Shortness Of Breath and Swelling    Throat Throat Throat Throat almost closed off Throat Throat Throat    Codeine Anxiety, Hives, Nausea And Vomiting, Shortness Of Breath and Swelling    Throat Throat throat throat Throat Throat    Sulfamethoxazole-Trimethoprim Anxiety, Shortness Of Breath and Swelling    Throat swelling Throat swelling    Sulfa Antibiotics Swelling    Swollen throat    Current Meds  Medication Sig   AIMOVIG 140 MG/ML SOAJ INJECT 140 MG UNDER THE SKIN EVERY 30 DAYS   albuterol (VENTOLIN HFA) 108 (90 Base) MCG/ACT inhaler Inhale into the lungs.   atorvastatin (LIPITOR) 40 MG tablet Take 40 mg by mouth daily.   buPROPion (WELLBUTRIN XL)  300 MG 24 hr tablet Take 1 tablet (300 mg total) by mouth daily.   busPIRone (BUSPAR) 10 MG tablet Take 1 tablet (10 mg total) by mouth 3 (three) times daily.   DULoxetine (CYMBALTA) 60 MG capsule Take 60 mg by mouth daily.   Estradiol 10 MCG TABS vaginal tablet Place vaginally.   famotidine (PEPCID) 40 MG tablet Take 1 tablet (40 mg total) by mouth daily.   fluticasone (FLOVENT HFA) 110 MCG/ACT inhaler Inhale into the lungs 2 (two) times daily.   lidocaine (XYLOCAINE) 5 % ointment lidocaine 5 % topical ointment   lisinopril (ZESTRIL) 20 MG tablet Take 20 mg by mouth daily.   Probiotic Product (ENVIVE PO) Take by mouth.   PROLIA 60 MG/ML SOSY injection Inject into the skin.   Semaglutide, 1 MG/DOSE, 4 MG/3ML SOPN Inject 1 mg as directed once a week.   zolmitriptan (ZOMIG-ZMT) 5 MG disintegrating tablet Take by mouth.    Objective: There were no vitals taken for this visit.  Physical Exam:  General: Elderly female., Alert and oriented., and No acute distress. Gait: Normal gait.  Evaluation of the right hand demonstrates ecchymosis over the dorsum of her hand, and at the base of the right thumb.  She has multiple well-healed surgical incisions, without surrounding erythema or drainage.  She has some tenderness to palpation at the base of her right thumb.  Active range of motion of the right wrist.  She is able to make a fist.  Fingers are warm and well-perfused.  IMAGING: I personally reviewed images previously obtained from the ED  X-rays of the right hand are without acute injury.  Evidence of prior surgery at the base of the thumb, including near complete removal of the trapezium.  There does appear to be some arthritic changes in the radial aspect of the trapezoid.  New Medications:  No orders of the defined types were placed in this encounter.     Oliver Barre, MD  08/08/2021 1:01 PM

## 2021-08-14 ENCOUNTER — Encounter: Payer: Self-pay | Admitting: Orthopedic Surgery

## 2021-08-14 ENCOUNTER — Ambulatory Visit (INDEPENDENT_AMBULATORY_CARE_PROVIDER_SITE_OTHER): Payer: 59 | Admitting: Orthopedic Surgery

## 2021-08-14 ENCOUNTER — Ambulatory Visit (INDEPENDENT_AMBULATORY_CARE_PROVIDER_SITE_OTHER): Payer: 59

## 2021-08-14 ENCOUNTER — Other Ambulatory Visit: Payer: Self-pay

## 2021-08-14 DIAGNOSIS — M25532 Pain in left wrist: Secondary | ICD-10-CM | POA: Diagnosis not present

## 2021-08-14 DIAGNOSIS — M79644 Pain in right finger(s): Secondary | ICD-10-CM | POA: Diagnosis not present

## 2021-08-14 DIAGNOSIS — M79645 Pain in left finger(s): Secondary | ICD-10-CM | POA: Diagnosis not present

## 2021-08-14 NOTE — Progress Notes (Signed)
Office Visit Note   Patient: Monique Warren           Date of Birth: 22-Dec-1950           MRN: [id number] Visit Date: 08/14/2021              Requested by: Oliver Barre, MD [phone] [address] [place],  [place] [address] PCP: Anabel Halon, MD   Assessment & Plan: Visit Diagnoses:  1. Pain in left wrist   2. Bilateral thumb pain     Plan: Discussed with patient the nature of her basilar thumb pain.  She likely has residual arthritis between the scaphotrapezoid joint and between the new articulation between the scaphoid and thumb metacarpal base after her previous LR TI surgery nearly 30 years ago.  The pain on the right side is worse than the left as this right side was recently injured in a fall several weeks ago.  We discussed treatment options for her thumb pain including conservative management with bracing, activity modification, oral and topical anti-inflammatories, and corticosteroid injection.  We also discussed surgical treatment with revision thumb suspension plasty.  At this point we will try bracing and topical NSAIDs.  She is not interested in corticosteroid injections.  I will see her back in 4 to 6 weeks to see how she is progressing.  Follow-Up Instructions: No follow-ups on file.   Orders:  Orders Placed This Encounter  Procedures   XR Wrist Complete Left   No orders of the defined types were placed in this encounter.     Procedures: No procedures performed   Clinical Data: No additional findings.   Subjective: Chief Complaint  Patient presents with   Left Hand - Pain   Right Hand - Pain    This is a 70 year old right-hand-dominant female who presents with bilateral basilar thumb pain.  Her right hand is more symptomatic than the left.  Left only bothers her occasionally.  She had a fall several weeks ago when she was pulled down by her [place] retriever and fell under this right hand.  She since had pain at the base of his right thumb at the first  webspace.  She had some intermittent pain in the both of these thumbs prior to her recent injury.  She had a bilateral CMC LR TI procedure done 25 to 30 years ago.  She also had a wrist fracture on the left side that was treated with a volar plate.  It sounds like this previous procedure was a classic LR TI using the FCR tendon.  She describes pain at the base of her thumb and the first webspace.  She has worn a brace at night because she has occasional nocturnal symptoms.  She takes Tylenol as needed.  She does not take oral anti-inflammatory secondary to celiac disease.   Review of Systems   Objective: Vital Signs: There were no vitals taken for this visit.  Physical Exam Constitutional:      Appearance: Normal appearance.  Cardiovascular:     Rate and Rhythm: Normal rate.     Pulses: Normal pulses.  Pulmonary:     Effort: Pulmonary effort is normal.  Skin:    General: Skin is warm and dry.     Capillary Refill: Capillary refill takes less than 2 seconds.  Neurological:     Mental Status: She is alert.    Right Hand Exam   Tenderness  Right hand tenderness location: TTP at base of thumb,  thenar eminence, and first web space.  Mild ecchymosis at first web space and base of thumb.  Other  Erythema: absent Sensation: normal Pulse: present  Comments:  Pain w/ CMC grind but without crepitus.  No passive MP hyper-extension or pain at the MP joint.  No palmar abduction contracture   Left Hand Exam   Tenderness  Left hand tenderness location: Mild TTP at base of thumb.   Comments:  Pain w/ CMC grind but without crepitus.  No passive MP hyper-extension or pain at the MP joint.  No palmar abduction contracture     Specialty Comments:  No specialty comments available.  Imaging: 3 views of the right hand from 11/15 are reviewed interpreted by me.  They demonstrate evidence of a previous trapeziectomy with some bony fragments in the trapeziectomy space.  There appears to be  evidence of joint space narrowing between the trapezoid and scaphoid.  There appears to be bone-on-bone articulation with the base of the thumb metacarpal and trapezoid.  [facility]. 3 views of the left wrist taken today reviewed interpreted by me.  They demonstrate similar findings with a previous trapeziectomy and likely FCR suspension plasty.  There again appears to be evidence of bony articulation between the base of the metacarpal and the trapezoid.  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  There is a previous volar plate with a well-healed distal radius fracture.   PMFS History: Patient Active Problem List   Diagnosis Date Noted   Bilateral thumb pain 08/14/2021   Chest pain 07/02/2021   Hernia of abdominal cavity 06/12/2021   Type 2 diabetes mellitus (HCC) 06/12/2021   Vertigo 06/12/2021   Carpal tunnel syndrome of right wrist 05/16/2021   Polyneuropathy due to type 2 diabetes mellitus (HCC) 05/16/2021   Depression with anxiety 04/19/2021   HTN (hypertension) 04/19/2021   BPPV (benign paroxysmal positional vertigo), right 09/25/2019   Celiac disease 09/25/2019   Obstructive sleep apnea 09/25/2019   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   Hyperlipidemia 10/16/2015   Arthritis 09/27/2015   Asthma 09/27/2015   Migraine without aura or status migrainosus 09/27/2015   Other intervertebral disc degeneration, lumbosacral region 01/26/2014   Wrist fracture 11/06/2012   Osteoporosis 09/16/2008   Type 2 diabetes mellitus with diabetic neuropathy, unspecified (HCC) 09/16/2008   Sliding hiatal hernia 1950/10/30   Heart murmur of newborn 1950/10/30   Past Medical History:  Diagnosis Date   Arthritis    Asthma    Celiac disease    Diabetes mellitus, type II (HCC)    Diabetic neuropathy (HCC)    Heart murmur    Hiatal hernia    Sliding   Hyperlipidemia    Hypertension    Migraine    Obstructive sleep apnea     Osteoporosis    Vertigo     Family History  Problem Relation Age of Onset   Breast cancer Mother    Thyroid disease Mother    Stroke Mother    Hypertension Father    Hyperlipidemia Father    Heart attack Father    Heart failure Father    Breast cancer Paternal Grandmother     Past Surgical History:  Procedure Laterality Date   ABDOMINAL HYSTERECTOMY  09/29/2003   APPENDECTOMY     BREAST BIOPSY Left    CARPAL TUNNEL RELEASE     CATARACT EXTRACTION Bilateral    October and November 2016   CRANIOTOMY FOR ANEURYSM / VERTEBROBASILAR /  CAROTID CIRCULATION Right 10/24/2015   LAPAROSCOPY     UTERINE FIBROID SURGERY     Social History   Occupational History   Not on file  Tobacco Use   Smoking status: Former    Packs/day: 0.50    Types: Cigarettes   Smokeless tobacco: Never  Vaping Use   Vaping Use: Never used  Substance and Sexual Activity   Alcohol use: Yes    Alcohol/week: 4.0 standard drinks    Types: 2 Glasses of wine, 2 Shots of liquor per week    Comment: on the weekends   Drug use: Never   Sexual activity: Not on file

## 2021-08-18 NOTE — Progress Notes (Deleted)
Referring Provider: Anabel Halon, MD Primary Care Physician:  Anabel Halon, MD Primary Gastroenterologist:  Dr. Bonnetta Barry chief complaint on file.   HPI:   Monique Warren is a 70 y.o. female presenting today at the request of Anabel Halon, MD for celiac disease.   EGD and colonoscopy January 2019 in [place], [place]. Pathology available in our system: Gastric biopsy with nonspecific chronic gastritis, intestinal metaplasia, duodenal biopsy with increased intraepithelial lymphocytes without blunting of villi, nonspecific but can be associated with celiac sprue and recommended correlation with clinical features and serologic studies, GE junction biopsy with benign mucosa with reactive changes suggestive of reflux, midesophagus biopsy benign.  Colon polyp pathology revealed tubular adenoma.    Today:      ?celiac serologies   Past Medical History:  Diagnosis Date   Arthritis    Asthma    Celiac disease    Diabetes mellitus, type II (HCC)    Diabetic neuropathy (HCC)    Heart murmur    Hiatal hernia    Sliding   Hyperlipidemia    Hypertension    Migraine    Obstructive sleep apnea    Osteoporosis    Vertigo     Past Surgical History:  Procedure Laterality Date   ABDOMINAL HYSTERECTOMY  09/29/2003   APPENDECTOMY     BREAST BIOPSY Left    CARPAL TUNNEL RELEASE     CATARACT EXTRACTION Bilateral    October and November 2016   CRANIOTOMY FOR ANEURYSM / VERTEBROBASILAR / CAROTID CIRCULATION Right 10/24/2015   LAPAROSCOPY     UTERINE FIBROID SURGERY      Current Outpatient Medications  Medication Sig Dispense Refill   AIMOVIG 140 MG/ML SOAJ INJECT 140 MG UNDER THE SKIN EVERY 30 DAYS 1 mL 11   albuterol (VENTOLIN HFA) 108 (90 Base) MCG/ACT inhaler Inhale into the lungs.     atorvastatin (LIPITOR) 40 MG tablet Take 40 mg by mouth daily.     buPROPion (WELLBUTRIN XL) 300 MG 24 hr tablet Take 1 tablet (300 mg total) by mouth daily. 90 tablet 1   busPIRone  (BUSPAR) 10 MG tablet Take 1 tablet (10 mg total) by mouth 3 (three) times daily. 90 tablet 1   DULoxetine (CYMBALTA) 60 MG capsule Take 60 mg by mouth daily.     Estradiol 10 MCG TABS vaginal tablet Place vaginally.     famotidine (PEPCID) 40 MG tablet Take 1 tablet (40 mg total) by mouth daily. 90 tablet 1   fluticasone (FLOVENT HFA) 110 MCG/ACT inhaler Inhale into the lungs 2 (two) times daily.     lidocaine (XYLOCAINE) 5 % ointment lidocaine 5 % topical ointment     lisinopril (ZESTRIL) 20 MG tablet Take 20 mg by mouth daily.     Probiotic Product (ENVIVE PO) Take by mouth.     PROLIA 60 MG/ML SOSY injection Inject into the skin.     Semaglutide, 1 MG/DOSE, 4 MG/3ML SOPN Inject 1 mg as directed once a week. 6 mL 3   zolmitriptan (ZOMIG-ZMT) 5 MG disintegrating tablet Take by mouth.     No current facility-administered medications for this visit.    Allergies as of 08/20/2021 - Review Complete 08/14/2021  Allergen Reaction Noted   Cefuroxime Anaphylaxis, Anxiety, Hives, Shortness Of Breath, and Swelling 09/16/2006   Codeine Anxiety, Hives, Nausea And Vomiting, Shortness Of Breath, and Swelling 09/16/2006   Sulfamethoxazole-trimethoprim Anxiety, Shortness Of Breath, and Swelling 09/16/2006   Sulfa antibiotics Swelling 09/02/2017  Family History  Problem Relation Age of Onset   Breast cancer Mother    Thyroid disease Mother    Stroke Mother    Hypertension Father    Hyperlipidemia Father    Heart attack Father    Heart failure Father    Breast cancer Paternal Grandmother     Social History   Socioeconomic History   Marital status: Married    Spouse name: Not on file   Number of children: Not on file   Years of education: Not on file   Highest education level: Not on file  Occupational History   Not on file  Tobacco Use   Smoking status: Former    Packs/day: 0.50    Types: Cigarettes   Smokeless tobacco: Never  Vaping Use   Vaping Use: Never used  Substance and  Sexual Activity   Alcohol use: Yes    Alcohol/week: 4.0 standard drinks    Types: 2 Glasses of wine, 2 Shots of liquor per week    Comment: on the weekends   Drug use: Never   Sexual activity: Not on file  Other Topics Concern   Not on file  Social History Narrative   Not on file   Social Determinants of Health   Financial Resource Strain: Not on file  Food Insecurity: Not on file  Transportation Needs: Not on file  Physical Activity: Not on file  Stress: Not on file  Social Connections: Not on file  Intimate Partner Violence: Not on file    Review of Systems: Gen: Denies any fever, chills, fatigue, weight loss, lack of appetite.  CV: Denies chest pain, heart palpitations, peripheral edema, syncope.  Resp: Denies shortness of breath at rest or with exertion. Denies wheezing or cough.  GI: Denies dysphagia or odynophagia. Denies jaundice, hematemesis, fecal incontinence. GU : Denies urinary burning, urinary frequency, urinary hesitancy MS: Denies joint pain, muscle weakness, cramps, or limitation of movement.  Derm: Denies rash, itching, dry skin Psych: Denies depression, anxiety, memory loss, and confusion Heme: Denies bruising, bleeding, and enlarged lymph nodes.  Physical Exam: There were no vitals taken for this visit. General:   Alert and oriented. Pleasant and cooperative. Well-nourished and well-developed.  Head:  Normocephalic and atraumatic. Eyes:  Without icterus, sclera clear and conjunctiva pink.  Ears:  Normal auditory acuity. Nose:  No deformity, discharge,  or lesions. Mouth:  No deformity or lesions, oral mucosa pink.  Neck:  Supple, without mass or thyromegaly. Lungs:  Clear to auscultation bilaterally. No wheezes, rales, or rhonchi. No distress.  Heart:  S1, S2 present without murmurs appreciated.  Abdomen:  +BS, soft, non-tender and non-distended. No HSM noted. No guarding or rebound. No masses appreciated.  Rectal:  Deferred  Msk:  Symmetrical without  gross deformities. Normal posture. Pulses:  Normal pulses noted. Extremities:  Without clubbing or edema. Neurologic:  Alert and  oriented x4;  grossly normal neurologically. Skin:  Intact without significant lesions or rashes. Cervical Nodes:  No significant cervical adenopathy. Psych:  Alert and cooperative. Normal mood and affect.

## 2021-08-20 ENCOUNTER — Encounter: Payer: Self-pay | Admitting: Internal Medicine

## 2021-08-20 ENCOUNTER — Ambulatory Visit: Payer: 59 | Admitting: Gastroenterology

## 2021-09-05 ENCOUNTER — Other Ambulatory Visit: Payer: Self-pay

## 2021-09-05 ENCOUNTER — Ambulatory Visit (INDEPENDENT_AMBULATORY_CARE_PROVIDER_SITE_OTHER): Payer: 59 | Admitting: Internal Medicine

## 2021-09-05 ENCOUNTER — Encounter: Payer: Self-pay | Admitting: Internal Medicine

## 2021-09-05 VITALS — BP 146/90 | HR 105 | Ht 62.0 in | Wt 204.2 lb

## 2021-09-05 DIAGNOSIS — E782 Mixed hyperlipidemia: Secondary | ICD-10-CM | POA: Diagnosis not present

## 2021-09-05 DIAGNOSIS — Z79899 Other long term (current) drug therapy: Secondary | ICD-10-CM | POA: Diagnosis not present

## 2021-09-05 DIAGNOSIS — R011 Cardiac murmur, unspecified: Secondary | ICD-10-CM | POA: Diagnosis not present

## 2021-09-05 DIAGNOSIS — I10 Essential (primary) hypertension: Secondary | ICD-10-CM

## 2021-09-05 NOTE — Progress Notes (Signed)
Cardiology Office Note   Date:  09/05/2021   ID:  Monique Warren, DOB Nov 25, 1950, MRN [place]:[id number]  PCP:  Lindell Spar, MD  Cardiologist:   Dorris Carnes, MD   Pt presents for evaluation of CP    History of Present Illness: Monique Warren is a 70 y.o. female with a history of HTN, cerebral aneurysm, s/p clip, asthma, OSA(Has CPAP, using it ) GERD, T2DM, She was seen by PCP in October  Complained of CP   Pain mid-sternal, not associated with activity    Lasted minutes to hours.  Associated with some SOB Pt has also had an episode of syncope while driving in past per report  Patient says she is still having spells of CP   With and without activity  Some times if has ice cream late at night will get CP   Not like relfux pain Will get exhausted taking dogs to back door   Doesn't take much to get winded   Pt says her tolerance for activity is worse  Walks outside  Winded   Has dx of DM x 3 years      Current Meds  Medication Sig   AIMOVIG 140 MG/ML SOAJ INJECT 140 MG UNDER THE SKIN EVERY 30 DAYS   albuterol (VENTOLIN HFA) 108 (90 Base) MCG/ACT inhaler Inhale into the lungs.   atorvastatin (LIPITOR) 40 MG tablet Take 40 mg by mouth daily.   buPROPion (WELLBUTRIN XL) 300 MG 24 hr tablet Take 1 tablet (300 mg total) by mouth daily.   busPIRone (BUSPAR) 10 MG tablet Take 1 tablet (10 mg total) by mouth 3 (three) times daily.   DULoxetine (CYMBALTA) 60 MG capsule Take 60 mg by mouth daily.   Estradiol 10 MCG TABS vaginal tablet Place vaginally.   famotidine (PEPCID) 40 MG tablet Take 1 tablet (40 mg total) by mouth daily.   fluticasone (FLOVENT HFA) 110 MCG/ACT inhaler Inhale into the lungs 2 (two) times daily.   lidocaine (XYLOCAINE) 5 % ointment lidocaine 5 % topical ointment   lisinopril (ZESTRIL) 20 MG tablet Take 20 mg by mouth daily.   Probiotic Product (ENVIVE PO) Take by mouth.   PROLIA 60 MG/ML SOSY injection Inject into the skin.   Semaglutide, 1 MG/DOSE, 4 MG/3ML SOPN  Inject 1 mg as directed once a week.   zolmitriptan (ZOMIG-ZMT) 5 MG disintegrating tablet Take by mouth.     Allergies:   Cefuroxime, Codeine, Sulfamethoxazole-trimethoprim, and Sulfa antibiotics   Past Medical History:  Diagnosis Date   Arthritis    Asthma    Celiac disease    Diabetes mellitus, type II ([place])    Diabetic neuropathy ([place])    Heart murmur    Hiatal hernia    Sliding   Hyperlipidemia    Hypertension    Migraine    Obstructive sleep apnea    Osteoporosis    Vertigo     Past Surgical History:  Procedure Laterality Date   ABDOMINAL HYSTERECTOMY  09/29/2003   APPENDECTOMY     BREAST BIOPSY Left    CARPAL TUNNEL RELEASE     CATARACT EXTRACTION Bilateral    October and November 2016   CRANIOTOMY FOR ANEURYSM / VERTEBROBASILAR / CAROTID CIRCULATION Right 10/24/2015   LAPAROSCOPY     UTERINE FIBROID SURGERY       Social History:  The patient  reports that she has quit smoking. Her smoking use included cigarettes. She smoked an average of .5 packs per day. She  has never used smokeless tobacco. She reports current alcohol use of about 4.0 standard drinks per week. She reports that she does not use drugs.   Family History:  The patient's family history includes Breast cancer in her mother and paternal grandmother; Heart attack in her father; Heart failure in her father; Hyperlipidemia in her father; Hypertension in her father; Stroke in her mother; Thyroid disease in her mother.    ROS:  Please see the history of present illness. All other systems are reviewed and  Negative to the above problem except as noted.    PHYSICAL EXAM: VS:  BP (!) 146/90    Pulse (!) 105    Ht 5\' 2"  (1.575 m)    Wt 204 lb 3.2 oz (92.6 kg)    SpO2 98%    BMI 37.35 kg/m   GEN: Morbidly obese 70 yo in no acute distress  HEENT: normal  Neck: no JVD, no bruits  Cardiac: RRR; III/Vi systolic murmur LSB to base  No LE edema  Respiratory:  clear to auscultation bilaterally GI: soft,  nontender, nondistended, + BS  No hepatomegaly  MS: no deformity Moving all extremities   Skin: warm and dry, no rash Neuro:  Strength and sensation are intact Psych: euthymic mood, full affect   EKG:  EKG is not ordered today. In October 2022  SR   89 bpm  LVH with repol abnormality    Lipid Panel No results found for: CHOL, TRIG, HDL, CHOLHDL, VLDL, LDLCALC, LDLDIRECT    Wt Readings from Last 3 Encounters:  09/05/21 204 lb 3.2 oz (92.6 kg)  07/04/21 208 lb 3.2 oz (94.4 kg)  07/02/21 206 lb 1.3 oz (93.5 kg)      ASSESSMENT AND PLAN:  1  CP / SOB   Concerning   With murmur I would recomm first getting echo    COnsider CT angiogram vs cath based on findings   2 Murmur  Will get echo to evaluate  3  HTN   BP is higher than target  She says it usually runs like this   Hold on changes in meds for now     4  HL   Will get lipids      5  DM  Watch Carbs    F/U based on echo   Check CBC, BMET and Lipids  F/U based on     Current medicines are reviewed at length with the patient today.  The patient does not have concerns regarding medicines.  Signed, 07/04/21, MD  09/05/2021 1:47 PM    [facility] [address] [place], [place], [place]  [place] Phone: [phone]; Fax: [phone]

## 2021-09-05 NOTE — Patient Instructions (Signed)
Medication Instructions:  Your physician recommends that you continue on your current medications as directed. Please refer to the Current Medication list given to you today.  *If you need a refill on your cardiac medications before your next appointment, please call your pharmacy*   Lab Work: Your physician recommends that you return for lab work in: Fasting   If you have labs (blood work) drawn today and your tests are completely normal, you will receive your results only by: MyChart Message (if you have MyChart) OR A paper copy in the mail If you have any lab test that is abnormal or we need to change your treatment, we will call you to review the results.   Testing/Procedures: Your physician has requested that you have an echocardiogram. Echocardiography is a painless test that uses sound waves to create images of your heart. It provides your doctor with information about the size and shape of your heart and how well your hearts chambers and valves are working. This procedure takes approximately one hour. There are no restrictions for this procedure.    Follow-Up: At [facility], you and your health needs are our priority.  As part of our continuing mission to provide you with exceptional heart care, we have created designated Provider Care Teams.  These Care Teams include your primary Cardiologist (physician) and Advanced Practice Providers (APPs -  Physician Assistants and Nurse Practitioners) who all work together to provide you with the care you need, when you need it.  We recommend signing up for the patient portal called "MyChart".  Sign up information is provided on this After Visit Summary.  MyChart is used to connect with patients for Virtual Visits (Telemedicine).  Patients are able to view lab/test results, encounter notes, upcoming appointments, etc.  Non-urgent messages can be sent to your provider as well.   To learn more about what you can do with MyChart, go to  [email or web address].    Your next appointment:    To be determined   The format for your next appointment:   In Person  Provider:   Dietrich Pates, MD    Other Instructions Thank you for choosing [facility]!

## 2021-09-13 ENCOUNTER — Other Ambulatory Visit: Payer: Self-pay | Admitting: Internal Medicine

## 2021-09-13 DIAGNOSIS — F418 Other specified anxiety disorders: Secondary | ICD-10-CM

## 2021-10-02 ENCOUNTER — Encounter: Payer: Self-pay | Admitting: Internal Medicine

## 2021-10-02 ENCOUNTER — Other Ambulatory Visit: Payer: Self-pay

## 2021-10-02 ENCOUNTER — Ambulatory Visit: Payer: 59 | Admitting: Internal Medicine

## 2021-10-02 VITALS — BP 132/82 | HR 62 | Resp 18 | Ht 62.0 in | Wt 204.1 lb

## 2021-10-02 DIAGNOSIS — G4733 Obstructive sleep apnea (adult) (pediatric): Secondary | ICD-10-CM

## 2021-10-02 DIAGNOSIS — R079 Chest pain, unspecified: Secondary | ICD-10-CM

## 2021-10-02 DIAGNOSIS — M79644 Pain in right finger(s): Secondary | ICD-10-CM

## 2021-10-02 DIAGNOSIS — E1140 Type 2 diabetes mellitus with diabetic neuropathy, unspecified: Secondary | ICD-10-CM

## 2021-10-02 DIAGNOSIS — M810 Age-related osteoporosis without current pathological fracture: Secondary | ICD-10-CM

## 2021-10-02 DIAGNOSIS — G43009 Migraine without aura, not intractable, without status migrainosus: Secondary | ICD-10-CM

## 2021-10-02 DIAGNOSIS — K5900 Constipation, unspecified: Secondary | ICD-10-CM

## 2021-10-02 DIAGNOSIS — M79645 Pain in left finger(s): Secondary | ICD-10-CM

## 2021-10-02 DIAGNOSIS — I10 Essential (primary) hypertension: Secondary | ICD-10-CM | POA: Diagnosis not present

## 2021-10-02 MED ORDER — POLYETHYLENE GLYCOL 3350 17 GM/SCOOP PO POWD: 17.0000 g | Freq: Two times a day (BID) | ORAL | 1 refills | Status: DC | PRN

## 2021-10-02 NOTE — Assessment & Plan Note (Signed)
Due to arthritis of wrist joint and metacarpals Followed by hand surgery

## 2021-10-02 NOTE — Assessment & Plan Note (Signed)
Gets Prolia, followed by Rheumatology in [place] 

## 2021-10-02 NOTE — Assessment & Plan Note (Signed)
Uses CPAP, has difficulty with mask Referred to Neurology for OSA evaluation and migraine 

## 2021-10-02 NOTE — Assessment & Plan Note (Signed)
Chronic, takes probiotics Added MiraLAX as needed Needs to maintain adequate hydration

## 2021-10-02 NOTE — Assessment & Plan Note (Signed)
BP Readings from Last 1 Encounters:  10/02/21 132/82   Usually well-controlled with lisinopril Counseled for compliance with the medications Advised DASH diet and moderate exercise/walking, at least 150 mins/week

## 2021-10-02 NOTE — Progress Notes (Signed)
Established Patient Office Visit  Subjective:  Patient ID: Monique Warren, female    DOB: 02/18/1951  Age: 71 y.o. MRN: [id number]  CC:  Chief Complaint  Patient presents with   Follow-up    3 months follow up     HPI Monique Warren is a 71 y.o. female with past medical history of hypertension, cerebral artery aneurysm s/p clip, migraine, asthma, OSA, celiac disease, GERD, type II DM, osteoporosis, depression with anxiety and obesity who presents for f/u of her chronic medical conditions.  HTN: BP is well-controlled. Takes medications regularly. Patient denies headache, dizziness, dyspnea or palpitations.  She has seen Dr. Harrington Challenger for murmur and episodes of chest pain.  She is awaiting echo.  Type II DM: Her HbA1C was 7.6 in 09/22.  Her Ozempic dose was increased by endocrinology.  Her blood glucose remains above 150 most of the time still.  She denies any polyuria or polydipsia.  Migraine: Takes Aimovig and feels better now. Takes zolmitriptan as needed for breakthrough headache.  Request local neurology referral.  She has seen Dr. Merlene Laughter, but prefers to see a different neurologist for migraine and OSA.  She could not see GI as she had COVID around her visit day.  She complains of chronic constipation, for which she takes probiotics currently.  She denies any melena or hematochezia.  She has history of celiac disease.  She is going to see nutritionist at endocrinology office soon.  Past Medical History:  Diagnosis Date   Arthritis    Asthma    Celiac disease    Diabetes mellitus, type II ([place])    Diabetic neuropathy ([place])    Heart murmur    Hiatal hernia    Sliding   Hyperlipidemia    Hypertension    Migraine    Obstructive sleep apnea    Osteoporosis    Vertigo     Past Surgical History:  Procedure Laterality Date   ABDOMINAL HYSTERECTOMY  09/29/2003   APPENDECTOMY     BREAST BIOPSY Left    CARPAL TUNNEL RELEASE     CATARACT EXTRACTION Bilateral    October and  November 2016   CRANIOTOMY FOR ANEURYSM / VERTEBROBASILAR / CAROTID CIRCULATION Right 10/24/2015   LAPAROSCOPY     UTERINE FIBROID SURGERY      Family History  Problem Relation Age of Onset   Breast cancer Mother    Thyroid disease Mother    Stroke Mother    Hypertension Father    Hyperlipidemia Father    Heart attack Father    Heart failure Father    Breast cancer Paternal Grandmother     Social History   Socioeconomic History   Marital status: Married    Spouse name: Not on file   Number of children: Not on file   Years of education: Not on file   Highest education level: Not on file  Occupational History   Not on file  Tobacco Use   Smoking status: Former    Packs/day: 0.50    Types: Cigarettes   Smokeless tobacco: Never  Vaping Use   Vaping Use: Never used  Substance and Sexual Activity   Alcohol use: Yes    Alcohol/week: 4.0 standard drinks    Types: 2 Glasses of wine, 2 Shots of liquor per week    Comment: on the weekends   Drug use: Never   Sexual activity: Not on file  Other Topics Concern   Not on file  Social History Narrative  Not on file   Social Determinants of Health   Financial Resource Strain: Not on file  Food Insecurity: Not on file  Transportation Needs: Not on file  Physical Activity: Not on file  Stress: Not on file  Social Connections: Not on file  Intimate Partner Violence: Not on file    Outpatient Medications Prior to Visit  Medication Sig Dispense Refill   AIMOVIG 140 MG/ML SOAJ INJECT 140 MG UNDER THE SKIN EVERY 30 DAYS 1 mL 11   albuterol (VENTOLIN HFA) 108 (90 Base) MCG/ACT inhaler Inhale into the lungs.     atorvastatin (LIPITOR) 40 MG tablet Take 40 mg by mouth daily.     buPROPion (WELLBUTRIN XL) 300 MG 24 hr tablet Take 1 tablet (300 mg total) by mouth daily. 90 tablet 1   busPIRone (BUSPAR) 10 MG tablet TAKE 1 TABLET THREE TIMES A DAY 90 tablet 5   DULoxetine (CYMBALTA) 60 MG capsule Take 60 mg by mouth daily.      Estradiol 10 MCG TABS vaginal tablet Place vaginally.     famotidine (PEPCID) 40 MG tablet Take 1 tablet (40 mg total) by mouth daily. 90 tablet 1   fluticasone (FLOVENT HFA) 110 MCG/ACT inhaler Inhale into the lungs 2 (two) times daily.     lidocaine (XYLOCAINE) 5 % ointment lidocaine 5 % topical ointment     lisinopril (ZESTRIL) 20 MG tablet Take 20 mg by mouth daily.     Probiotic Product (ENVIVE PO) Take by mouth.     PROLIA 60 MG/ML SOSY injection Inject into the skin.     Semaglutide, 1 MG/DOSE, 4 MG/3ML SOPN Inject 1 mg as directed once a week. 6 mL 3   zolmitriptan (ZOMIG-ZMT) 5 MG disintegrating tablet Take by mouth.     No facility-administered medications prior to visit.    Allergies  Allergen Reactions   Cefuroxime Anaphylaxis, Anxiety, Hives, Shortness Of Breath and Swelling    Throat Throat Throat Throat almost closed off Throat Throat Throat    Codeine Anxiety, Hives, Nausea And Vomiting, Shortness Of Breath and Swelling    _0  Throat    Sulfamethoxazole-Trimethoprim Anxiety, Shortness Of Breath and Swelling    Throat swelling Throat swelling    Sulfa Antibiotics Swelling    Swollen throat    ROS Review of Systems  Constitutional:  Negative for chills and fever.  HENT:  Negative for congestion, sinus pressure, sinus pain and sore throat.   Eyes:  Negative for pain and discharge.  Respiratory:  Negative for cough and shortness of breath.   Cardiovascular:  Positive for chest pain. Negative for palpitations.  Gastrointestinal:  Positive for constipation. Negative for abdominal pain, diarrhea, nausea and vomiting.  Endocrine: Negative for polydipsia and polyuria.  Genitourinary:  Negative for dysuria and hematuria.  Musculoskeletal:  Positive for back pain. Negative for neck pain and neck stiffness.  Skin:  Negative for rash.  Neurological:  Positive for headaches. Negative for dizziness and weakness.   Psychiatric/Behavioral:  Negative for agitation and behavioral problems.      Objective:    Physical Exam Vitals reviewed.  Constitutional:      General: She is not in acute distress.    Appearance: She is obese. She is not diaphoretic.  HENT:     Head: Normocephalic and atraumatic.     Nose: Nose normal.     Mouth/Throat:     Mouth: Mucous membranes are moist.  Eyes:     General: No scleral  icterus.    Extraocular Movements: Extraocular movements intact.  Cardiovascular:     Rate and Rhythm: Normal rate and regular rhythm.     Pulses: Normal pulses.     Heart sounds: Murmur (Systolic over right upper sternal border) heard.  Pulmonary:     Breath sounds: Normal breath sounds. No wheezing or rales.  Abdominal:     Palpations: Abdomen is soft.     Tenderness: There is no abdominal tenderness.  Musculoskeletal:     Cervical back: Neck supple. No tenderness.     Right lower leg: No edema.     Left lower leg: No edema.  Skin:    General: Skin is warm.     Findings: No rash.  Neurological:     General: No focal deficit present.     Mental Status: She is alert and oriented to person, place, and time.  Psychiatric:        Mood and Affect: Mood normal.        Behavior: Behavior normal.    BP 132/82 (BP Location: Left Arm, Patient Position: Sitting, Cuff Size: Normal)    Pulse 62    Resp 18    Ht _0  (1.575 m)    Wt 204 lb 1.3 oz (92.6 kg)    SpO2 95%    BMI 37.33 kg/m  Wt Readings from Last 3 Encounters:  10/02/21 204 lb 1.3 oz (92.6 kg)  09/05/21 204 lb 3.2 oz (92.6 kg)  07/04/21 208 lb 3.2 oz (94.4 kg)    No results found for: TSH No results found for: WBC, HGB, HCT, MCV, PLT No results found for: NA, K, CHLORIDE, CO2, GLUCOSE, BUN, CREATININE, BILITOT, ALKPHOS, AST, ALT, PROT, ALBUMIN, CALCIUM, ANIONGAP, EGFR, GFR No results found for: CHOL No results found for: HDL No results found for: LDLCALC No results found for: TRIG No results found for: CHOLHDL Lab  Results  Component Value Date   HGBA1C 7.6 (A) 06/11/2021      Assessment & Plan:   Problem List Items Addressed This Visit       Cardiovascular and Mediastinum   Migraine without aura or status migrainosus    On Aimovig, refilled for now Zolmitriptan as needed for breakthrough headache Used to follow-up with neurology in [place] Referred to local Neurologist      Relevant Orders   Ambulatory referral to Neurology   HTN (hypertension) - Primary    BP Readings from Last 1 Encounters:  10/02/21 132/82  Usually well-controlled with lisinopril Counseled for compliance with the medications Advised DASH diet and moderate exercise/walking, at least 150 mins/week        Respiratory   Obstructive sleep apnea    Uses CPAP, has difficulty with mask Referred to Neurology for OSA evaluation and migraine      Relevant Orders   Ambulatory referral to Neurology     Musculoskeletal and Integument   Osteoporosis    Gets Prolia, followed by Rheumatology in [place]        Other   Chest pain    Intermittent, unrelated to activity Evaluated by Cardiology - visit note reviewed, awaiting Echo May need cardiac cath      Bilateral thumb pain    Due to arthritis of wrist joint and metacarpals Followed by hand surgery      Constipation    Chronic, takes probiotics Added MiraLAX as needed Needs to maintain adequate hydration      Relevant Medications   polyethylene glycol powder (GLYCOLAX/MIRALAX) 17  GM/SCOOP powder    Meds ordered this encounter  Medications   polyethylene glycol powder (GLYCOLAX/MIRALAX) 17 GM/SCOOP powder    Sig: Take 17 g by mouth 2 (two) times daily as needed for moderate constipation.    Dispense:  3350 g    Refill:  1    Follow-up: Return in about 6 months (around 04/01/2022) for Annual physical.    Lindell Spar, MD

## 2021-10-02 NOTE — Assessment & Plan Note (Signed)
On Aimovig, refilled for now Zolmitriptan as needed for breakthrough headache Used to follow-up with neurology in [place] Referred to local Neurologist

## 2021-10-02 NOTE — Assessment & Plan Note (Signed)
Uncontrolled On Ozempic, followed by Endocrinology On statin and ACEi Diabetic eye exam: Referred to Ophthalmology On Cymbalta for neuropathy

## 2021-10-02 NOTE — Patient Instructions (Addendum)
Please take Miralax as needed for constipation.  Please continue to follow low carb diet and ambulate as tolerated.  Please continue to take medications as prescribed.

## 2021-10-02 NOTE — Assessment & Plan Note (Signed)
Intermittent, unrelated to activity Evaluated by Cardiology - visit note reviewed, awaiting Echo May need cardiac cath

## 2021-10-03 ENCOUNTER — Other Ambulatory Visit: Payer: Self-pay | Admitting: Internal Medicine

## 2021-10-03 ENCOUNTER — Encounter: Payer: Self-pay | Admitting: Internal Medicine

## 2021-10-03 LAB — CMP14+EGFR
ALT: 50 IU/L — ABNORMAL HIGH (ref 0–32)
AST: 35 IU/L (ref 0–40)
Albumin/Globulin Ratio: 2.2 (ref 1.2–2.2)
Albumin: 4.7 g/dL (ref 3.8–4.8)
Alkaline Phosphatase: 123 IU/L — ABNORMAL HIGH (ref 44–121)
BUN/Creatinine Ratio: 13 (ref 12–28)
BUN: 13 mg/dL (ref 8–27)
Bilirubin Total: 0.5 mg/dL (ref 0.0–1.2)
CO2: 21 mmol/L (ref 20–29)
Calcium: 10.2 mg/dL (ref 8.7–10.3)
Chloride: 104 mmol/L (ref 96–106)
Creatinine, Ser: 1.04 mg/dL — ABNORMAL HIGH (ref 0.57–1.00)
Globulin, Total: 2.1 g/dL (ref 1.5–4.5)
Glucose: 114 mg/dL — ABNORMAL HIGH (ref 70–99)
Potassium: 5 mmol/L (ref 3.5–5.2)
Sodium: 144 mmol/L (ref 134–144)
Total Protein: 6.8 g/dL (ref 6.0–8.5)
eGFR: 58 mL/min/{1.73_m2} — ABNORMAL LOW (ref >59)

## 2021-10-03 LAB — URINALYSIS
Bilirubin, UA: NEGATIVE
Glucose, UA: NEGATIVE
Ketones, UA: NEGATIVE
Nitrite, UA: NEGATIVE
Protein,UA: NEGATIVE
RBC, UA: NEGATIVE
Specific Gravity, UA: 1.026 (ref 1.005–1.030)
Urobilinogen, Ur: 0.2 mg/dL (ref 0.2–1.0)
pH, UA: 5.5 (ref 5.0–7.5)

## 2021-10-03 LAB — LIPID PANEL
Chol/HDL Ratio: 3.6 ratio (ref 0.0–4.4)
Cholesterol, Total: 184 mg/dL (ref 100–199)
HDL: 51 mg/dL (ref >39)
LDL Chol Calc (NIH): 109 mg/dL — ABNORMAL HIGH (ref 0–99)
Triglycerides: 137 mg/dL (ref 0–149)
VLDL Cholesterol Cal: 24 mg/dL (ref 5–40)

## 2021-10-03 LAB — CBC WITH DIFFERENTIAL/PLATELET
Basophils Absolute: 0 10*3/uL (ref 0.0–0.2)
Basos: 1 %
EOS (ABSOLUTE): 0.1 10*3/uL (ref 0.0–0.4)
Eos: 1 %
Hematocrit: 48.4 % — ABNORMAL HIGH (ref 34.0–46.6)
Hemoglobin: 16.3 g/dL — ABNORMAL HIGH (ref 11.1–15.9)
Immature Grans (Abs): 0 10*3/uL (ref 0.0–0.1)
Immature Granulocytes: 0 %
Lymphocytes Absolute: 2.6 10*3/uL (ref 0.7–3.1)
Lymphs: 33 %
MCH: 31.4 pg (ref 26.6–33.0)
MCHC: 33.7 g/dL (ref 31.5–35.7)
MCV: 93 fL (ref 79–97)
Monocytes Absolute: 0.6 10*3/uL (ref 0.1–0.9)
Monocytes: 8 %
Neutrophils Absolute: 4.6 10*3/uL (ref 1.4–7.0)
Neutrophils: 57 %
Platelets: 348 10*3/uL (ref 150–450)
RBC: 5.19 x10E6/uL (ref 3.77–5.28)
RDW: 11.6 % — ABNORMAL LOW (ref 11.7–15.4)
WBC: 7.9 10*3/uL (ref 3.4–10.8)

## 2021-10-03 LAB — VITAMIN D 25 HYDROXY (VIT D DEFICIENCY, FRACTURES): Vit D, 25-Hydroxy: 40.4 ng/mL (ref 30.0–100.0)

## 2021-10-03 LAB — HEMOGLOBIN A1C
Est. average glucose Bld gHb Est-mCnc: 157 mg/dL
Hgb A1c MFr Bld: 7.1 % — ABNORMAL HIGH (ref 4.8–5.6)

## 2021-10-03 LAB — TSH+FREE T4
Free T4: 1.08 ng/dL (ref 0.82–1.77)
TSH: 2.83 u[IU]/mL (ref 0.450–4.500)

## 2021-10-03 LAB — HEPATITIS C ANTIBODY: Hep C Virus Ab: <0.1 s/co ratio (ref 0.0–0.9)

## 2021-10-04 ENCOUNTER — Ambulatory Visit: Payer: 59 | Admitting: Orthopedic Surgery

## 2021-10-04 ENCOUNTER — Other Ambulatory Visit: Payer: Self-pay

## 2021-10-04 ENCOUNTER — Encounter: Payer: Self-pay | Admitting: Orthopedic Surgery

## 2021-10-04 DIAGNOSIS — M19032 Primary osteoarthritis, left wrist: Secondary | ICD-10-CM | POA: Diagnosis not present

## 2021-10-04 DIAGNOSIS — M79644 Pain in right finger(s): Secondary | ICD-10-CM | POA: Diagnosis not present

## 2021-10-04 DIAGNOSIS — M79645 Pain in left finger(s): Secondary | ICD-10-CM

## 2021-10-04 DIAGNOSIS — M19031 Primary osteoarthritis, right wrist: Secondary | ICD-10-CM

## 2021-10-04 MED ORDER — MELOXICAM 7.5 MG PO TABS: 7.5000 mg | ORAL_TABLET | Freq: Every day | ORAL | 0 refills | Status: AC
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
## 2021-10-09 ENCOUNTER — Ambulatory Visit: Payer: 59 | Admitting: Nurse Practitioner

## 2021-10-09 ENCOUNTER — Encounter: Payer: Self-pay | Admitting: Nurse Practitioner

## 2021-10-09 ENCOUNTER — Ambulatory Visit: Payer: 59

## 2021-10-09 ENCOUNTER — Other Ambulatory Visit: Payer: Self-pay

## 2021-10-09 VITALS — BP 117/76 | HR 98 | Ht 62.0 in | Wt 206.2 lb

## 2021-10-09 DIAGNOSIS — E782 Mixed hyperlipidemia: Secondary | ICD-10-CM

## 2021-10-09 DIAGNOSIS — I10 Essential (primary) hypertension: Secondary | ICD-10-CM | POA: Diagnosis not present

## 2021-10-09 DIAGNOSIS — E1140 Type 2 diabetes mellitus with diabetic neuropathy, unspecified: Secondary | ICD-10-CM

## 2021-10-09 NOTE — Progress Notes (Signed)
Endocrinology Follow Up Note       10/09/2021, 2:00 PM   Subjective:    Patient ID: Monique Warren, female    DOB: 1951-03-01.  Monique Warren is being seen in follow up after being seen in consultation for management of currently uncontrolled symptomatic diabetes requested by  Lindell Spar, MD.   Past Medical History:  Diagnosis Date   Arthritis    Asthma    Celiac disease    Diabetes mellitus, type II ([place])    Diabetic neuropathy ([place])    Heart murmur    Hiatal hernia    Sliding   Hyperlipidemia    Hypertension    Migraine    Obstructive sleep apnea    Osteoporosis    Vertigo     Past Surgical History:  Procedure Laterality Date   ABDOMINAL HYSTERECTOMY  09/29/2003   APPENDECTOMY     BREAST BIOPSY Left    CARPAL TUNNEL RELEASE     CATARACT EXTRACTION Bilateral    October and November 2016   CRANIOTOMY FOR ANEURYSM / VERTEBROBASILAR / CAROTID CIRCULATION Right 10/24/2015   LAPAROSCOPY     UTERINE FIBROID SURGERY      Social History   Socioeconomic History   Marital status: Married    Spouse name: Not on file   Number of children: Not on file   Years of education: Not on file   Highest education level: Not on file  Occupational History   Not on file  Tobacco Use   Smoking status: Former    Packs/day: 0.50    Types: Cigarettes   Smokeless tobacco: Never  Vaping Use   Vaping Use: Never used  Substance and Sexual Activity   Alcohol use: Yes    Alcohol/week: 4.0 standard drinks    Types: 2 Glasses of wine, 2 Shots of liquor per week    Comment: on the weekends   Drug use: Never   Sexual activity: Not on file  Other Topics Concern   Not on file  Social History Narrative   Not on file   Social Determinants of Health   Financial Resource Strain: Not on file  Food Insecurity: Not on file  Transportation Needs: Not on file  Physical Activity: Not on file  Stress: Not on  file  Social Connections: Not on file    Family History  Problem Relation Age of Onset   Breast cancer Mother    Thyroid disease Mother    Stroke Mother    Hypertension Father    Hyperlipidemia Father    Heart attack Father    Heart failure Father    Breast cancer Paternal Grandmother     Outpatient Encounter Medications as of 10/09/2021  Medication Sig   AIMOVIG 140 MG/ML SOAJ INJECT 140 MG UNDER THE SKIN EVERY 30 DAYS   albuterol (VENTOLIN HFA) 108 (90 Base) MCG/ACT inhaler Inhale into the lungs.   atorvastatin (LIPITOR) 40 MG tablet Take 40 mg by mouth daily.   buPROPion (WELLBUTRIN XL) 300 MG 24 hr tablet Take 1 tablet (300 mg total) by mouth daily.   busPIRone (BUSPAR) 10 MG tablet TAKE 1 TABLET THREE TIMES A DAY  DULoxetine (CYMBALTA) 60 MG capsule Take 60 mg by mouth daily.   Estradiol 10 MCG TABS vaginal tablet Place vaginally.   famotidine (PEPCID) 40 MG tablet TAKE 1 TABLET DAILY   fluticasone (FLOVENT HFA) 110 MCG/ACT inhaler Inhale into the lungs 2 (two) times daily.   lidocaine (XYLOCAINE) 5 % ointment lidocaine 5 % topical ointment   lisinopril (ZESTRIL) 20 MG tablet Take 20 mg by mouth daily.   meloxicam (MOBIC) 7.5 MG tablet Take 1 tablet (7.5 mg total) by mouth daily.   polyethylene glycol powder (GLYCOLAX/MIRALAX) 17 GM/SCOOP powder Take 17 g by mouth 2 (two) times daily as needed for moderate constipation.   Probiotic Product (ENVIVE PO) Take by mouth.   PROLIA 60 MG/ML SOSY injection Inject into the skin.   Semaglutide, 1 MG/DOSE, 4 MG/3ML SOPN Inject 1 mg as directed once a week.   zolmitriptan (ZOMIG-ZMT) 5 MG disintegrating tablet Take by mouth.   No facility-administered encounter medications on file as of 10/09/2021.    ALLERGIES: Allergies  Allergen Reactions   Cefuroxime Anaphylaxis, Anxiety, Hives, Shortness Of Breath and Swelling    Throat Throat Throat Throat almost closed off Throat Throat Throat    Codeine Anxiety, Hives, Nausea And  Vomiting, Shortness Of Breath and Swelling    Throat Throat throat throat Throat Throat    Sulfamethoxazole-Trimethoprim Anxiety, Shortness Of Breath and Swelling    Throat swelling Throat swelling    Sulfa Antibiotics Swelling    Swollen throat    VACCINATION STATUS: Immunization History  Administered Date(s) Administered   Fluad Quad(high Dose 70+) 07/02/2021   Influenza Split 07/26/2014   Influenza, High Dose Seasonal PF 09/22/2018   PFIZER(Purple Top)SARS-COV-2 Vaccination 12/01/2019, 12/29/2019   Tdap 09/30/2016   Zoster Recombinat (Shingrix) 10/03/2017, 06/03/2018    Diabetes Monique Warren presents for her follow-up diabetic visit. Monique Warren has type 2 diabetes mellitus. Onset time: Diagnosed at approx age of 19. Her disease course has been improving. There are no hypoglycemic associated symptoms. Associated symptoms include foot paresthesias. Pertinent negatives for diabetes include no polyuria. There are no hypoglycemic complications. Symptoms are stable. Diabetic complications include nephropathy and peripheral neuropathy. Risk factors for coronary artery disease include diabetes mellitus, dyslipidemia, family history, hypertension, obesity, post-menopausal and sedentary lifestyle. Current diabetic treatments: Ozempic 1 mg SQ weekly. Monique Warren is compliant with treatment most of the time. Her weight is stable. Monique Warren is following a generally healthy diet. When asked about meal planning, Monique Warren reported none. Monique Warren has not had a previous visit with a dietitian. Monique Warren participates in exercise intermittently. Her home blood glucose trend is fluctuating minimally. (Monique Warren presents today with her logs, showing slightly above target fasting glycemic profile.  Her previsit A1c was 7.1%, improving from last visit of 7.6%.  We did increase her Ozempic to 1 mg and Monique Warren has tolerated it well.  Monique Warren denies any hypoglycemia.  Monique Warren does admit to indulging in foods Monique Warren knows Monique Warren shouldn't have during the holiday season.) An ACE  inhibitor/angiotensin II receptor blocker is being taken. Monique Warren does not see a podiatrist.Eye exam is current (has eye exam later today).  Hyperlipidemia This is a chronic problem. The current episode started more than 1 year ago. The problem is uncontrolled. Recent lipid tests were reviewed and are high. Exacerbating diseases include chronic renal disease, diabetes and obesity. Factors aggravating her hyperlipidemia include fatty foods. Current antihyperlipidemic treatment includes statins. The current treatment provides moderate improvement of lipids. There are no compliance problems.  Risk factors for coronary artery disease include  diabetes mellitus, dyslipidemia, family history, obesity, hypertension, post-menopausal and a sedentary lifestyle.  Hypertension This is a chronic problem. The current episode started more than 1 year ago. The problem has been gradually improving since onset. The problem is controlled. There are no associated agents to hypertension. Risk factors for coronary artery disease include diabetes mellitus, dyslipidemia, family history, obesity, post-menopausal state and sedentary lifestyle. Past treatments include ACE inhibitors. The current treatment provides moderate improvement. There are no compliance problems.  Hypertensive end-organ damage includes kidney disease. Identifiable causes of hypertension include chronic renal disease and sleep apnea.    Review of systems  Constitutional: + Minimally fluctuating body weight, current Body mass index is 37.71 kg/m., no fatigue, no subjective hyperthermia, no subjective hypothermia Eyes: no blurry vision, no xerophthalmia ENT: no sore throat, no nodules palpated in throat, no dysphagia/odynophagia, no hoarseness Cardiovascular: no chest pain, no shortness of breath, no palpitations, no leg swelling Respiratory: no cough, no shortness of breath Gastrointestinal: no nausea/vomiting/diarrhea Musculoskeletal: no muscle/joint  aches Skin: no rashes, no hyperemia Neurological: no tremors, + numbness/tingling to BLE, no dizziness Psychiatric: no depression, no anxiety  Objective:     BP 117/76    Pulse 98    Ht 5\' 2"  (1.575 m)    Wt 206 lb 3.2 oz (93.5 kg)    SpO2 98%    BMI 37.71 kg/m   Wt Readings from Last 3 Encounters:  10/09/21 206 lb 3.2 oz (93.5 kg)  10/02/21 204 lb 1.3 oz (92.6 kg)  09/05/21 204 lb 3.2 oz (92.6 kg)     BP Readings from Last 3 Encounters:  10/09/21 117/76  10/02/21 132/82  09/05/21 (!) 146/90     Physical Exam- Limited  Constitutional:  Body mass index is 37.71 kg/m. , not in acute distress, normal state of mind Eyes:  EOMI, no exophthalmos Neck: Supple Cardiovascular: RRR, + murmur, rubs, or gallops, no edema Respiratory: Adequate breathing efforts, no crackles, rales, rhonchi, or wheezing Musculoskeletal: no gross deformities, strength intact in all four extremities, no gross restriction of joint movements Skin:  no rashes, no hyperemia Neurological: no tremor with outstretched hands     Diabetic Foot Exam - Simple   No data filed     CMP ( most recent) CMP     Component Value Date/Time   NA 144 10/02/2021 1627   K 5.0 10/02/2021 1627   CL 104 10/02/2021 1627   CO2 21 10/02/2021 1627   GLUCOSE 114 (H) 10/02/2021 1627   BUN 13 10/02/2021 1627   CREATININE 1.04 (H) 10/02/2021 1627   CALCIUM 10.2 10/02/2021 1627   PROT 6.8 10/02/2021 1627   ALBUMIN 4.7 10/02/2021 1627   AST 35 10/02/2021 1627   ALT 50 (H) 10/02/2021 1627   ALKPHOS 123 (H) 10/02/2021 1627   BILITOT 0.5 10/02/2021 1627     Diabetic Labs (most recent): Lab Results  Component Value Date   HGBA1C 7.1 (H) 10/02/2021   HGBA1C 7.6 (A) 06/11/2021     Lipid Panel ( most recent) Lipid Panel     Component Value Date/Time   CHOL 184 10/02/2021 1626   TRIG 137 10/02/2021 1626   HDL 51 10/02/2021 1626   CHOLHDL 3.6 10/02/2021 1626   LDLCALC 109 (H) 10/02/2021 1626   LABVLDL 24 10/02/2021  1626      Lab Results  Component Value Date   TSH 2.830 10/02/2021   FREET4 1.08 10/02/2021           Assessment & Plan:  1) Type 2 diabetes mellitus with diabetic neuropathy, without long-term current use of insulin ([place])  Monique Warren presents today with her logs, showing slightly above target fasting glycemic profile.  Her previsit A1c was 7.1%, improving from last visit of 7.6%.  We did increase her Ozempic to 1 mg and Monique Warren has tolerated it well.  Monique Warren denies any hypoglycemia.  Monique Warren does admit to indulging in foods Monique Warren knows Monique Warren shouldn't have during the holiday season.  - Monique Warren has currently uncontrolled symptomatic type 2 DM since 71 years of age.  -Recent labs reviewed.  - I had a long discussion with her about the progressive nature of diabetes and the pathology behind its complications. -her diabetes is complicated by mild CKD, peripheral neuropathy and Monique Warren remains at a high risk for more acute and chronic complications which include CAD, CVA, CKD, retinopathy, and neuropathy. These are all discussed in detail with her.  - Nutritional counseling repeated at each appointment due to patients tendency to fall back in to old habits.  - The patient admits there is a room for improvement in their diet and drink choices. -  Suggestion is made for the patient to avoid simple carbohydrates from their diet including Cakes, Sweet Desserts / Pastries, Ice Cream, Soda (diet and regular), Sweet Tea, Candies, Chips, Cookies, Sweet Pastries, Store Bought Juices, Alcohol in Excess of 1-2 drinks a day, Artificial Sweeteners, Coffee Creamer, and "Sugar-free" Products. This will help patient to have stable blood glucose profile and potentially avoid unintended weight gain.   - I encouraged the patient to switch to unprocessed or minimally processed complex starch and increased protein intake (animal or plant source), fruits, and vegetables.   - Patient is advised to stick to a routine  mealtimes to eat 3 meals a day and avoid unnecessary snacks (to snack only to correct hypoglycemia).  - I have approached her with the following individualized plan to manage her diabetes and patient agrees:   -Monique Warren is advised to continue her Ozempic 1 mg SQ weekly given her improving glycemic profile.  -Monique Warren does not need to routinely monitor glucose due to monotherapy with Ozempic only.  - Monique Warren does not tolerate Metformin due to GI upset (r/t her Celiac disease) and has allergy to sulfa meds.  - Specific targets for  A1c; LDL, HDL, and Triglycerides were discussed with the patient.  2) Blood Pressure /Hypertension:  her blood pressure is controlled to target.   Monique Warren is advised to continue her current medications including Lisinopril 20 mg p.o. daily with breakfast.  3) Lipids/Hyperlipidemia:    Her most recent lipid panel from 10/02/21 shows uncontrolled LDL of 109.  Monique Warren is advised to continue Lipitor 40 mg po daily at bedtime.  Side effects and precautions discussed with her.    4)  Weight/Diet:  her Body mass index is 37.71 kg/m.  -  clearly complicating her diabetes care.   Monique Warren is a candidate for weight loss. I discussed with her the fact that loss of 5 - 10% of her  current body weight will have the most impact on her diabetes management.  Exercise, and detailed carbohydrates information provided  -  detailed on discharge instructions.  5) Chronic Care/Health Maintenance: -Monique Warren is on ACEI/ARB and Statin medications and is encouraged to initiate and continue to follow up with Ophthalmology, Dentist, Podiatrist at least yearly or according to recommendations, and advised to stay away from smoking. I have recommended yearly flu vaccine and pneumonia vaccine at least every  5 years; moderate intensity exercise for up to 150 minutes weekly; and sleep for at least 7 hours a day.  - Monique Warren is advised to maintain close follow up with Lindell Spar, MD for primary care needs, as well as her other  providers for optimal and coordinated care.      I spent 41 minutes in the care of the patient today including review of labs from [facility], Lipids, Thyroid Function, Hematology (current and previous including abstractions from other facilities); face-to-face time discussing  her blood glucose readings/logs, discussing hypoglycemia and hyperglycemia episodes and symptoms, medications doses, her options of short and long term treatment based on the latest standards of care / guidelines;  discussion about incorporating lifestyle medicine;  and documenting the encounter.    Please refer to Patient Instructions for Blood Glucose Monitoring and Insulin/Medications Dosing Guide"  in media tab for additional information. Please  also refer to " Patient Self Inventory" in the Media  tab for reviewed elements of pertinent patient history.  [name] participated in the discussions, expressed understanding, and voiced agreement with the above plans.  All questions were answered to her satisfaction. Monique Warren is encouraged to contact clinic should Monique Warren have any questions or concerns prior to her return visit.     Follow up plan: - Return in about 3 months (around 01/07/2022) for Diabetes F/U with A1c in office, No previsit labs, Bring meter and logs.    Rayetta Pigg, [facility] [address] [place], [place] [address] Phone: [phone] Fax: [phone]  10/09/2021, 2:00 PM

## 2021-10-09 NOTE — Patient Instructions (Signed)

## 2021-10-10 ENCOUNTER — Encounter: Payer: Self-pay | Admitting: Occupational Therapy

## 2021-10-10 ENCOUNTER — Ambulatory Visit: Payer: 59 | Admitting: Occupational Therapy

## 2021-10-10 DIAGNOSIS — M6281 Muscle weakness (generalized): Secondary | ICD-10-CM | POA: Diagnosis not present

## 2021-10-10 DIAGNOSIS — R278 Other lack of coordination: Secondary | ICD-10-CM

## 2021-10-10 DIAGNOSIS — M25541 Pain in joints of right hand: Secondary | ICD-10-CM | POA: Diagnosis not present

## 2021-10-10 DIAGNOSIS — M25641 Stiffness of right hand, not elsewhere classified: Secondary | ICD-10-CM | POA: Diagnosis not present

## 2021-10-10 DIAGNOSIS — R600 Localized edema: Secondary | ICD-10-CM

## 2021-10-10 LAB — HM DIABETES EYE EXAM

## 2021-10-10 NOTE — Patient Instructions (Signed)
WEARING SCHEDULE:  Wear splint at ALL times as directed by your doctor, except for hygiene care.  PURPOSE:  To prevent movement and for protection until injury can heal  CARE OF SPLINT:  Keep splint away from heat sources including: stove, radiator or furnace, or a car in sunlight. The splint can melt and will no longer fit you properly  Keep away from pets and children  Clean the splint with rubbing alcohol as needed.  * During this time, make sure you also clean your hand/arm as instructed by your therapist and/or perform dressing changes as needed. Then dry hand/arm completely before replacing splint. (When cleaning hand/arm, keep it immobilized in same position until splint is replaced)  PRECAUTIONS/POTENTIAL PROBLEMS: *If you notice or experience increased pain, swelling, numbness, or a lingering reddened area from the splint: Contact your therapist immediately by calling [phone]. You must wear the splint for protection, but we will get you scheduled for adjustments as quickly as possible.  (If only straps or hooks need to be replaced and NO adjustments to the splint need to be made, just call the office ahead and let them know you are coming in)  If you have any medical concerns or signs of infection, please call your doctor immediately

## 2021-10-10 NOTE — Therapy (Signed)
OUTPATIENT OCCUPATIONAL THERAPY ORTHO EVALUATION  Patient Name: Monique Warren MRN: [id number] DOB:03/16/1951, 71 y.o., female Today's Date: 10/10/2021  PCP: Anabel HalonPatel, Rutwik K, MD REFERRING PROVIDER: Marlyne BeardsBenfield, Charlie, MD    Past Medical History:  Diagnosis Date   Arthritis    Asthma    Celiac disease    Diabetes mellitus, type II (HCC)    Diabetic neuropathy (HCC)    Heart murmur    Hiatal hernia    Sliding   Hyperlipidemia    Hypertension    Migraine    Obstructive sleep apnea    Osteoporosis    Vertigo    Past Surgical History:  Procedure Laterality Date   ABDOMINAL HYSTERECTOMY  09/29/2003   APPENDECTOMY     BREAST BIOPSY Left    CARPAL TUNNEL RELEASE     CATARACT EXTRACTION Bilateral    October and November 2016   CRANIOTOMY FOR ANEURYSM / VERTEBROBASILAR / CAROTID CIRCULATION Right 10/24/2015   LAPAROSCOPY     UTERINE FIBROID SURGERY     Patient Active Problem List   Diagnosis Date Noted   Constipation 10/02/2021   Bilateral thumb pain 08/14/2021   Chest pain 07/02/2021   Hernia of abdominal cavity 06/12/2021   Vertigo 06/12/2021   Carpal tunnel syndrome of right wrist 05/16/2021   Polyneuropathy due to type 2 diabetes mellitus (HCC) 05/16/2021   Depression with anxiety 04/19/2021   HTN (hypertension) 04/19/2021   BPPV (benign paroxysmal positional vertigo), right 09/25/2019   Celiac disease 09/25/2019   Obstructive sleep apnea 09/25/2019   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   Hyperlipidemia 10/16/2015   Arthritis 09/27/2015   Asthma 09/27/2015   Migraine without aura or status migrainosus 09/27/2015   Other intervertebral disc degeneration, lumbosacral region 01/26/2014   Wrist fracture 11/06/2012   Osteoporosis 09/16/2008   Type 2 diabetes mellitus with diabetic neuropathy, unspecified (HCC) 09/16/2008   Sliding hiatal hernia 1951-05-16   Heart murmur of newborn 1951-05-16    ONSET DATE: Initially ~8 weeks ago  REFERRING DIAG:  Diagnosis M19.031,M19.032 (ICD-10-CM) - 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   SUBJECTIVE:   SUBJECTIVE STATEMENT:  Patient is a 71 year old right-hand-dominant female who presents per Dr Frazier ButtBenfield w/ dx bilateral basilar thumb pain. Her right hand is much more symptomatic than the left. Her pain seems to have started after being pulled down by her dog a few months ago. Her pain is worse w/ certain activities such as typing.  She has tried tylenol and an OTC brace worn sporadically at night. She has never worn a brace consistently during the day. Pt presents for a custom thumb spica splint today.  PERTINENT HISTORY:  Pt reports a recent fall while walking her dog. She states that she injured her left hand/wrist within the last week ("Just like I did my right, did the same thing"), R CTR 05/16/2021, old LRTI bilateral hands ~15-20 years ago per pt report, Arthritis, pt also reports h/o bilateral wrist fractures ("A long time ago"), Asthma, Celiac disease, DM, Diabetic neuropathy, Heart murmur, Hiatal hernia, Hyperlipidemia, Hypertension, Migraines, Obstructive sleep apnea, Osteoporosis, Vertigo.    PAIN:  Are you having pain? Yes NPRS scale: 3/10 Pain location: Fingers, right MF and dorsal hand Pain orientation: Right  PAIN TYPE: aching Pain description: constant  Aggravating factors: Using my hand, wakes me up at night Relieving factors: Nothing  PRECAUTIONS: Fall. Pt reports multiple falls  per month. She has a h/o vertigo and states that she has had PT before to treat this "It doesn't work". Pt reports that her last 2 falls were due to her dog pulling her down when walking him on a leash, once in November 2022 (landed on her R UE and again "last week" landing on her left UE. Pt was advised to f/u with her MD to discuss her recent falls & history to r/o any unknown  injuries.  HAND DOMINANCE: Right  WEIGHT BEARING RESTRICTIONS No  FALLS: Has patient fallen in last 6 months? Yes, Number of falls: 3x/month per pt report. ? Has vertigo, has had PT in the past per pt report. See precautions above.   PLOF: Independent with basic ADLs and has difficulty doing things overhead secondary to vertigo and frequent falls per pt report.  Pt lives alone but has family nearby and next door that assist her PRN per her report.  PATIENT GOALS  "Decreased pain in my hand/arm"  OBJECTIVE:   DIAGNOSTIC FINDINGS: Pt with h/o multiple surgeries and fractures of bilateral hands. She reports falling when being pulled by her dog in November of 2022 and again "over the last week" per her report. Pt reports LRTI "~15-20 years ago" bilateral hands and multiple "wrist fractures." Imaging: Results per Dr Frazier ButtBenfield 3 views of the right hand from 07/31/21 demonstrate evidence of a previous trapeziectomy with some bony fragments in the trapeziectomy space. There appears to be evidence of joint space narrowing between the trapezoid and scaphoid. There appears to be bone-on-bone articulation with the base of the thumb metacarpal and trapezoid. There is no hardware in this [facility] arthroplasty construct.  3 views of the left wrist demonstrates similar findings with a previous trapeziectomy and likely FCR suspension plasty.There again appears to be evidence of bony articulation between the base of the metacarpal and the trapezoid. 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  COGNITION: Overall cognitive status: Within functional limits for tasks assessed  ADLs: Overall ADLs: Pt is Mod I basic ADL's, she reports difficulty doing overhead activity secondary to vertigo. She has family that lives near by and next door that assist her PRN.  SENSATION: Light touch: Appears intact Semmes Weinstein Monofilament  scale: Normal (2.36 - 2.83) right & left hand digits 1-5  COORDINATION: 9 Hole Peg test: Right: NT sec; Left: NT sec Comments: Deferred  EDEMA: Min edema noted right dorsal wrist and thumb upon visual observation as compared to left.  PALPATION: Objective: Pt is with TTP at base of thumb./CMC bilaterally (R > L)  + CMC grind test w/ c/o pain on Right.   Opposition of thumb to small finger MP joint.   Negative for MP hyper-extension.   Incision over base of thumb and volar forearm pt reports LRTI procedure ~15-20 years ago bilaterally per pt report.     UE AROM/PROM: HAND A/PROM:  A/PROM Right 10/10/2021 Left 10/10/2021  Thumb MCP (0-60) NT NT  Thumb IP (0-80)    Thumb Radial abd/add (0-55)    Thumb Palmar abd/add (0-45)    Thumb opposition to index    Index MCP (0-90)    Index PIP (0-100)    Index DIP (0-70)    Long MCP (0-90)    Long PIP (0-100)    Long DIP (0-70)    Ring MCP (0-90_    Ring PIP (0-100)    Ring DIP (0-70)  Little MCP (0-90)    Little PIP (0-100)    Little DIP (0-70)    (Blank rows = not tested)  HAND FUNCTION:  Grip strength: Right: NT lbs; Left: NT lbs Comments: NT, deferred  TODAY'S TREATMENT:  Pt was fitted with a right custom fabricated hand based thumb spica after much discussion with patient. Pt states that she has not been able to wear pre-fabricated thumb spica splints that she has been issued previously and she felt as though a forearm based splint would be more difficult to tolerate/wear. Pt splint is hand based and places her thumb in the functional position of slight rotation and ABD. She was educated written and verbally in splinting use, wear and care. She was instructed by Dr Frazier Butt to wear this splint at all times for functional activity, remove for bathing/washing per her report.  Pt was advised to schedule a f/u with out patient OT for splint check and adjustment sometime within the next week to 10 days. This is a splinting Eval  only and pt may schedule up to 3 additional visits for adjustments PRN over the next month. Pt may also benefit from L splint if deemed appropriate by Dr Frazier Butt.   She was advised to schedule an appointment with Dr Frazier Butt ASAP secondary to her report of falling again within the last week and landing on her Left hand/wrist/UE while walking her dog. Pt was advised that she may have an unknown injury. She verbalized understanding.   PATIENT EDUCATION: Education details: Splinting use, wear, care and precautions Person educated: Patient Education method: Explanation, Demonstration, Verbal cues, and Handouts Education comprehension: verbalized understanding and returned demonstration   ASSESSMENT:  CLINICAL IMPRESSION: Patient is a 71 y.o. right hand dominant female who was seen today for occupational therapy evaluation and treatment for bilateral CMC pain (R > L) and for custom CMC thermoplast splint R UE. Patient has performance deficits in functional skills including ADLs, coordination, edema, strength, and pain. These impairments are limiting patient from ADLs. Patient has co-morbidities such as previous bilateral wrist fractures, ORIF, old LRTI surgeries "about 15-20 years ago", DM with neuropathy, CTR R, h/o falls etc per pt report  that affects occupational performance. Patient will benefit from skilled OT for splint Eval only for support to her CMC joints during ADL's and functional activity. Pt was fitted with a right custom fabricated hand based thumb spica after much discussion. Pt states that she has not been able to wear pre-fabricated thumb spica splints that she has been issued previously and she felt as though a forearm based splint would be more difficult to tolerate/wear. Pt splint is hand based and places her thumb in the functional position of slight rotation and ABD. She was educated written and verbally in splinting use, wear and care. She was instructed by Dr Frazier Butt to wear  this splint at all times for functional activity, remove for bathing/washing per her report. Pt was advised to schedule a f/u with out patient OT for splint check and adjustment sometime within the next week to 10 days. As this is a splinting Eval only, pt may schedule up to 3 additional visits for adjustments PRN over the next month. Pt may also benefit from L splint if deemed appropriate by Dr Frazier Butt. She was advised to schedule an appointment with Dr Frazier Butt ASAP secondary to her report of falling again within the last week and landing on her Left hand/wrist/UE while walking her dog. Pt was advised that she may have  an unknown injury. She verbalized understanding.  MODIFICATION OR ASSISTANCE TO COMPLETE EVALUATION: No modification of tasks or assist necessary to complete an evaluation.  OT OCCUPATIONAL PROFILE AND HISTORY: Problem focused assessment: Including review of records relating to presenting problem.  CLINICAL DECISION MAKING: Moderate - several treatment options, min-mod task modification necessary  REHAB POTENTIAL: Good  EVALUATION COMPLEXITY: Low     GOALS: Goals reviewed with patient? Yes  SHORT TERM GOALS: (STG required if POC>30 days)  STG Name Target Date Goal status  1 Pt will be Mod I splinting use, care and precautions bilateral hands/wrists PRN for support to thumb/CMC joints during simulated ADL's, light functional activity, as seen in clinic Baseline:  11/07/2021 INITIAL  2 Pt will report decreased pain in R dominant hand as seen by rating of 2/10 or less when wearing splint during simulated ADL. Baseline:  11/07/2021 INITIAL  Short Term Goals = LONG TERM GOALS   OT FREQUENCY:  Splint evaluation - pt may schedule up to 3 additional visits over the next month for splint check and adjustments PRN.  OT DURATION: 4 weeks  PLANNED INTERVENTIONS: self care/ADL training, therapeutic activity, splinting, patient/family education, and DME and/or AE  instructions  PLAN FOR NEXT SESSION: Right Splint check and adjustment, ?Thumb spica splint for L wrist/hand depending on pt f/u with Dr Frazier Butt [id number] fall onto left wrist/hand when walking her dog over the last week.  RECOMMENDED OTHER SERVICES: F/U with MD ASAP following fall onto L UE when walking her dog in the last week.   CONSULTED AND AGREED WITH PLAN OF CARE: Patient   Alm Bustard, OT 10/10/2021, 8:33 AM

## 2021-10-11 ENCOUNTER — Ambulatory Visit (HOSPITAL_COMMUNITY)
Admission: RE | Admit: 2021-10-11 | Discharge: 2021-10-11 | Disposition: A | Discharge status: Home / Self Care | Payer: 59 | Source: Ambulatory Visit | Attending: Internal Medicine | Admitting: Internal Medicine

## 2021-10-11 ENCOUNTER — Other Ambulatory Visit: Payer: Self-pay

## 2021-10-11 DIAGNOSIS — R011 Cardiac murmur, unspecified: Secondary | ICD-10-CM | POA: Diagnosis present

## 2021-10-11 LAB — ECHOCARDIOGRAM COMPLETE
AR max vel: 1.61 cm2
AV Area VTI: 1.59 cm2
AV Area mean vel: 1.47 cm2
AV Mean grad: 10 mmHg
AV Peak grad: 19.2 mmHg
Ao pk vel: 2.19 m/s
Area-P 1/2: 3.88 cm2
S' Lateral: 1.9 cm

## 2021-10-11 MED ORDER — PERFLUTREN LIPID MICROSPHERE
1.0000 mL | INTRAVENOUS | Status: AC | PRN
  Administered 2021-10-11: 2 mL via INTRAVENOUS
  Filled 2021-10-11: qty 10

## 2021-10-11 NOTE — Progress Notes (Signed)
*  PRELIMINARY RESULTS* Echocardiogram 2D Echocardiogram has been performed with [facility].  Samuel Germany 10/11/2021, 4:09 PM

## 2021-10-16 ENCOUNTER — Ambulatory Visit: Payer: 59 | Admitting: Orthopedic Surgery

## 2021-10-18 ENCOUNTER — Encounter: Payer: 59 | Admitting: Occupational Therapy

## 2021-10-19 ENCOUNTER — Encounter: Payer: Self-pay | Admitting: Internal Medicine

## 2021-10-26 ENCOUNTER — Telehealth: Payer: Self-pay

## 2021-10-26 MED ORDER — DILTIAZEM HCL ER 120 MG PO CP12: 240.0000 mg | ORAL_CAPSULE | Freq: Every day | ORAL | 3 refills | Status: DC

## 2021-10-26 NOTE — Telephone Encounter (Signed)
-----   Message from Fay Records, MD sent at 10/19/2021  6:17 PM EST ----- Reviewed echo  LV is vigorous function, near cavity oblitation in systole Recomm: Avoid dehydration   Stay hydrated  Would switch to diltiazem  120 CD   Take 1 daily for 2 days then 2 daily (240 mg) Take 5 mg lisinopril for 2 days then stop Pt will need follow  up in clnic to see how BP and SOB are   1 month I have called in Rx

## 2021-10-26 NOTE — Telephone Encounter (Signed)
Patient aware of medication changes, follow up appointment made with Dr.Ross.

## 2021-10-30 ENCOUNTER — Ambulatory Visit: Payer: 59 | Admitting: Internal Medicine

## 2021-10-30 ENCOUNTER — Encounter: Payer: Self-pay | Admitting: Internal Medicine

## 2021-10-30 ENCOUNTER — Other Ambulatory Visit: Payer: Self-pay

## 2021-10-30 DIAGNOSIS — U071 COVID-19: Secondary | ICD-10-CM

## 2021-10-30 MED ORDER — BENZONATATE 100 MG PO CAPS
100.0000 mg | ORAL_CAPSULE | Freq: Two times a day (BID) | ORAL | 0 refills | Status: DC | PRN
Start: 2021-10-30 — End: 2022-01-16

## 2021-10-30 MED ORDER — NIRMATRELVIR/RITONAVIR (PAXLOVID) TABLET (RENAL DOSING): 2.0000 | ORAL_TABLET | Freq: Two times a day (BID) | ORAL | 0 refills | Status: AC

## 2021-10-30 NOTE — Progress Notes (Signed)
Virtual Visit via Telephone Note   This visit type was conducted due to national recommendations for restrictions regarding the COVID-19 Pandemic (e.g. social distancing) in an effort to limit this patient's exposure and mitigate transmission in our community.  Due to her co-morbid illnesses, this patient is at least at moderate risk for complications without adequate follow up.  This format is felt to be most appropriate for this patient at this time.  The patient did not have access to video technology/had technical difficulties with video requiring transitioning to audio format only (telephone).  All issues noted in this document were discussed and addressed.  No physical exam could be performed with this format.  Evaluation Performed:  Follow-up visit  Date:  10/30/2021   ID:  Monique Warren, DOB 23-Jan-1951, MRN [id number]  Patient Location: Home Provider Location: Office/Clinic  Participants: Patient Location of Patient: Home Location of Provider: Telehealth Consent was obtain for visit to be over via telehealth. I verified that I am speaking with the correct person using two identifiers.  PCP:  Lindell Spar, MD   Chief Complaint: Cough, nasal congestion and fatigue  History of Present Illness:    Monique Warren is a 71 y.o. female who has a televisit for c/o cough, nasal congestion, fatigue and low-grade fever for the last 4 days.  She has been using albuterol inhaler for dyspnea with some relief.  Denies any recent wheezing.  She tested positive for COVID today.  She has had COVID vaccines.  The patient does have symptoms concerning for COVID-19 infection (fever, chills, cough, or new shortness of breath).   Past Medical, Surgical, Social History, Allergies, and Medications have been Reviewed.  Past Medical History:  Diagnosis Date   Arthritis    Asthma    Celiac disease    Diabetes mellitus, type II (Monique Warren)    Diabetic neuropathy (Monique Warren)    Heart murmur     Hiatal hernia    Sliding   Hyperlipidemia    Hypertension    Migraine    Obstructive sleep apnea    Osteoporosis    Vertigo    Past Surgical History:  Procedure Laterality Date   ABDOMINAL HYSTERECTOMY  09/29/2003   APPENDECTOMY     BREAST BIOPSY Left    CARPAL TUNNEL RELEASE     CATARACT EXTRACTION Bilateral    October and November 2016   CRANIOTOMY FOR ANEURYSM / VERTEBROBASILAR / CAROTID CIRCULATION Right 10/24/2015   LAPAROSCOPY     UTERINE FIBROID SURGERY       Current Meds  Medication Sig   AIMOVIG 140 MG/ML SOAJ INJECT 140 MG UNDER THE SKIN EVERY 30 DAYS   albuterol (VENTOLIN HFA) 108 (90 Base) MCG/ACT inhaler Inhale into the lungs.   atorvastatin (LIPITOR) 40 MG tablet Take 40 mg by mouth daily.   buPROPion (WELLBUTRIN XL) 300 MG 24 hr tablet Take 1 tablet (300 mg total) by mouth daily.   busPIRone (BUSPAR) 10 MG tablet TAKE 1 TABLET THREE TIMES A DAY   diltiazem (CARDIZEM SR) 120 MG 12 hr capsule Take 2 capsules (240 mg total) by mouth daily.   DULoxetine (CYMBALTA) 60 MG capsule Take 60 mg by mouth daily.   Estradiol 10 MCG TABS vaginal tablet Place vaginally.   famotidine (PEPCID) 40 MG tablet TAKE 1 TABLET DAILY   fluticasone (FLOVENT HFA) 110 MCG/ACT inhaler Inhale into the lungs 2 (two) times daily.   lidocaine (XYLOCAINE) 5 % ointment lidocaine 5 % topical ointment  meloxicam (MOBIC) 7.5 MG tablet Take 1 tablet (7.5 mg total) by mouth daily.   polyethylene glycol powder (GLYCOLAX/MIRALAX) 17 GM/SCOOP powder Take 17 g by mouth 2 (two) times daily as needed for moderate constipation.   Probiotic Product (ENVIVE PO) Take by mouth.   PROLIA 60 MG/ML SOSY injection Inject into the skin.   Semaglutide, 1 MG/DOSE, 4 MG/3ML SOPN Inject 1 mg as directed once a week.   zolmitriptan (ZOMIG-ZMT) 5 MG disintegrating tablet Take by mouth.     Allergies:   Cefuroxime, Codeine, Sulfamethoxazole-trimethoprim, and Sulfa antibiotics   ROS:   Please see the history of  present illness.     All other systems reviewed and are negative.   Labs/Other Tests and Data Reviewed:    Recent Labs: 10/02/2021: ALT 50; BUN 13; Creatinine, Ser 1.04; Hemoglobin 16.3; Platelets 348; Potassium 5.0; Sodium 144; TSH 2.830   Recent Lipid Panel Lab Results  Component Value Date/Time   CHOL 184 10/02/2021 04:26 PM   TRIG 137 10/02/2021 04:26 PM   HDL 51 10/02/2021 04:26 PM   CHOLHDL 3.6 10/02/2021 04:26 PM   LDLCALC 109 (H) 10/02/2021 04:26 PM    Wt Readings from Last 3 Encounters:  10/09/21 206 lb 3.2 oz (93.5 kg)  10/02/21 204 lb 1.3 oz (92.6 kg)  09/05/21 204 lb 3.2 oz (92.6 kg)     ASSESSMENT & PLAN:    COVID-19 infection Started Paxlovid, renally dosed due to last GFR - 58. Tessalon as needed for cough Advised to take Mucinex or Robitussin as needed for cough and nasal congestion Advised to go to ER if she has persistent dyspnea or chest pain with palpitations   Time:   Today, I have spent 9 minutes reviewing the chart, including problem list, medications, and with the patient with telehealth technology discussing the above problems.   Medication Adjustments/Labs and Tests Ordered: Current medicines are reviewed at length with the patient today.  Concerns regarding medicines are outlined above.   Tests Ordered: No orders of the defined types were placed in this encounter.   Medication Changes: No orders of the defined types were placed in this encounter.    Note: This dictation was prepared with Dragon dictation along with smaller phrase technology. Similar sounding words can be transcribed inadequately or may not be corrected upon review. Any transcriptional errors that result from this process are unintentional.      Disposition:  Follow up  Signed, Lindell Spar, MD  10/30/2021 2:21 PM     [facility]

## 2021-11-01 ENCOUNTER — Other Ambulatory Visit: Payer: Self-pay | Admitting: *Deleted

## 2021-11-01 MED ORDER — DILTIAZEM HCL ER 120 MG PO CP12: 240.0000 mg | ORAL_CAPSULE | Freq: Every day | ORAL | 1 refills | Status: DC

## 2021-11-06 ENCOUNTER — Other Ambulatory Visit: Payer: Self-pay

## 2021-11-06 MED ORDER — DILTIAZEM HCL ER 120 MG PO CP12: 240.0000 mg | ORAL_CAPSULE | Freq: Every day | ORAL | 1 refills | Status: DC

## 2021-11-06 NOTE — Telephone Encounter (Signed)
This is a [facility] pt.  °

## 2021-11-08 ENCOUNTER — Ambulatory Visit: Payer: 59 | Admitting: Orthopedic Surgery

## 2021-11-12 ENCOUNTER — Encounter: Payer: Self-pay | Admitting: Internal Medicine

## 2021-11-12 NOTE — Progress Notes (Signed)
? ?Cardiology Office Note ? ? ?Date:  11/15/2021  ? ?ID:  Monique Warren, DOB 04/17/1951, MRN [id number] ? ?PCP:  Lindell Spar, MD  ?Cardiologist:   Dorris Carnes, MD  ? ?Pt presents for follow up   ?  ?History of Present Illness: ?Monique Warren is a 71 y.o. female with a history of HTN, cerebral aneurysm, s/p clip, asthma, OSA(Has CPAP, using it ) GERD, T2DM, She was seen by PCP in October  Complained of CP   Pain mid-sternal, not associated with activity    Lasted minutes to hours.  Associated with some SOB ?Pt has also had an episode of syncope while driving in past per report ? ?I saw the pt in Dec 2022    Still having CP   With and without exertion    Complained of exhaustion, winded with activity   Murmur noted on exam     AN echo was done on Jan 2023   This showed LVH   Vigorous LV functoin with near cavity obliteration during systolice   Recomm adding diltiazem 120 then 240  Cut back on lisinopril ? ?Since this switch was made the patient says she feels the same   No improvement in symtpoms   ? ? ?Current Meds  ?Medication Sig  ? AIMOVIG 140 MG/ML SOAJ INJECT 140 MG UNDER THE SKIN EVERY 30 DAYS  ? albuterol (VENTOLIN HFA) 108 (90 Base) MCG/ACT inhaler Inhale into the lungs.  ? atorvastatin (LIPITOR) 40 MG tablet Take 40 mg by mouth daily.  ? benzonatate (TESSALON) 100 MG capsule Take 1 capsule (100 mg total) by mouth 2 (two) times daily as needed for cough.  ? buPROPion (WELLBUTRIN XL) 300 MG 24 hr tablet Take 1 tablet (300 mg total) by mouth daily.  ? busPIRone (BUSPAR) 10 MG tablet TAKE 1 TABLET THREE TIMES A DAY  ? diltiazem (CARDIZEM SR) 120 MG 12 hr capsule Take 2 capsules (240 mg total) by mouth daily.  ? DULoxetine (CYMBALTA) 60 MG capsule Take 60 mg by mouth daily.  ? Estradiol 10 MCG TABS vaginal tablet Place vaginally.  ? famotidine (PEPCID) 40 MG tablet TAKE 1 TABLET DAILY  ? fluticasone (FLOVENT HFA) 110 MCG/ACT inhaler Inhale into the lungs 2 (two) times daily.  ? lidocaine (XYLOCAINE) 5 %  ointment lidocaine 5 % topical ointment  ? polyethylene glycol powder (GLYCOLAX/MIRALAX) 17 GM/SCOOP powder Take 17 g by mouth 2 (two) times daily as needed for moderate constipation.  ? Probiotic Product (ENVIVE PO) Take by mouth.  ? PROLIA 60 MG/ML SOSY injection Inject into the skin.  ? Semaglutide, 1 MG/DOSE, 4 MG/3ML SOPN Inject 1 mg as directed once a week.  ? zolmitriptan (ZOMIG-ZMT) 5 MG disintegrating tablet Take by mouth.  ? ? ? ?Allergies:   Cefuroxime, Codeine, Sulfamethoxazole-trimethoprim, and Sulfa antibiotics  ? ?Past Medical History:  ?Diagnosis Date  ? Arthritis   ? Asthma   ? Celiac disease   ? Diabetes mellitus, type II ([place])   ? Diabetic neuropathy ([place])   ? Heart murmur   ? Hiatal hernia   ? Sliding  ? Hyperlipidemia   ? Hypertension   ? Migraine   ? Obstructive sleep apnea   ? Osteoporosis   ? Vertigo   ? ? ?Past Surgical History:  ?Procedure Laterality Date  ? ABDOMINAL HYSTERECTOMY  09/29/2003  ? APPENDECTOMY    ? BREAST BIOPSY Left   ? CARPAL TUNNEL RELEASE    ? CATARACT EXTRACTION Bilateral   ?  October and November 2016  ? CRANIOTOMY FOR ANEURYSM / VERTEBROBASILAR / CAROTID CIRCULATION Right 10/24/2015  ? LAPAROSCOPY    ? UTERINE FIBROID SURGERY    ? ? ? ?Social History:  The patient  reports that she has quit smoking. Her smoking use included cigarettes. She smoked an average of .5 packs per day. She has never used smokeless tobacco. She reports current alcohol use of about 4.0 standard drinks per week. She reports that she does not use drugs.  ? ?Family History:  The patient's family history includes Breast cancer in her mother and paternal grandmother; Heart attack in her father; Heart failure in her father; Hyperlipidemia in her father; Hypertension in her father; Stroke in her mother; Thyroid disease in her mother.  ? ? ?ROS:  Please see the history of present illness. All other systems are reviewed and  Negative to the above problem except as noted.  ? ? ?PHYSICAL EXAM: ?VS:  BP  120/70   Pulse 96   Ht 5\' 2"  (1.575 m)   Wt 205 lb 1.6 oz (93 kg)   SpO2 97%   BMI 37.51 kg/m?   ?GEN: Morbidly obese 71 yo in no acute distress  ?HEENT: normal  ?Neck: no JVD, no bruits  ?Cardiac: RRR; II/Vi systolic murmur LSB to base  No LE edema  ?Respiratory:  clear to auscultation  ?GI: soft, nontender, nondistended, + BS  No hepatomegaly  ?MS: no deformity Moving all extremities   ?Skin: warm and dry, no rash ?Neuro:  Strength and sensation are intact ?Psych: euthymic mood, full affect ? ? ?EKG:  EKG is not ordered today.  ? ?Echo  10/11/21 ? ?Left ventricular ejection fraction, by estimation, is 70 to 75%. The left ventricle has ?hyperdynamic function. The left ventricle has no regional wall motion abnormalities. ?There is moderate concentric left ventricular hypertrophy. Left ventricular diastolic ?parameters are consistent with Grade I diastolic dysfunction (impaired relaxation). ?Elevated left ventricular end-diastolic pressure. There is a mildly increased LVOT ?gradient that is stable with Valsalva, also some degree of mitral chordal SAM based on ?limited views. ?1. ?Right ventricular systolic function is normal. The right ventricular size is normal. ?Tricuspid regurgitation signal is inadequate for assessing PA pressure. ?2. ?3. The mitral valve is grossly normal. Trivial mitral valve regurgitation. ?The aortic valve was not well visualized. There is mild calcification of the aortic valve. ?Aortic valve regurgitation is not visualized. Aortic valve mean gradient measures 10.0 ?mmHg. Suspect LVOT gradient more a contributor than any significant degree of ?aortic stenosis. ?4. ?5. Unable to estimate CVP. ?Comparison(s): No prior Echocardiogram. ? ? ?Lipid Panel ?   ?Component Value Date/Time  ? CHOL 184 10/02/2021 1626  ? TRIG 137 10/02/2021 1626  ? HDL 51 10/02/2021 1626  ? CHOLHDL 3.6 10/02/2021 1626  ? LDLCALC 109 (H) 10/02/2021 1626  ? ?  ? ?Wt Readings from Last 3 Encounters:  ?11/15/21 205 lb  1.6 oz (93 kg)  ?10/09/21 206 lb 3.2 oz (93.5 kg)  ?10/02/21 204 lb 1.3 oz (92.6 kg)  ?  ? ? ?ASSESSMENT AND PLAN: ? ?1  CP / SOB  Pt remains symptomatic, partiucarly with DOE   Echo sugg of hypertrophic physiology   Still she has risks for CAD ?Reviewed with colleagues    I would recomm CT coronary angiogram to r/o signif CAD   If neg would pursue MRI for susp of HCM ?Keep on same meds for now   Avoid dehydration  ? ? ?2 Murmur  Most due to dynamic outflow obstruction with anatomy noted above   Mild aortic valve thickening but no significant stenosis  ? ?3  HTN   BP is OK     ? ?4  HL   LDL 109  HDL 51  Trig 137   Observe for now    ? ?5  DM  Watch Carbs    A1C 7.1 ? ? ?Current medicines are reviewed at length with the patient today.  The patient does not have concerns regarding medicines. ? ?Signed, ?Dorris Carnes, MD  ?11/15/2021 4:24 PM    ?[facility] ?[address], [place], [place]  [address] ?Phone: [phone]; Fax: [phone]  ? ? ?

## 2021-11-15 ENCOUNTER — Ambulatory Visit (INDEPENDENT_AMBULATORY_CARE_PROVIDER_SITE_OTHER): Payer: 59 | Admitting: Internal Medicine

## 2021-11-15 ENCOUNTER — Other Ambulatory Visit: Payer: Self-pay

## 2021-11-15 ENCOUNTER — Encounter: Payer: Self-pay | Admitting: Internal Medicine

## 2021-11-15 VITALS — BP 120/70 | HR 96 | Ht 62.0 in | Wt 205.1 lb

## 2021-11-15 DIAGNOSIS — R0609 Other forms of dyspnea: Secondary | ICD-10-CM | POA: Diagnosis not present

## 2021-11-15 NOTE — Patient Instructions (Signed)
Medication Instructions:  Your physician recommends that you continue on your current medications as directed. Please refer to the Current Medication list given to you today.  *If you need a refill on your cardiac medications before your next appointment, please call your pharmacy*   Lab Work: NONE   If you have labs (blood work) drawn today and your tests are completely normal, you will receive your results only by: MyChart Message (if you have MyChart) OR A paper copy in the mail If you have any lab test that is abnormal or we need to change your treatment, we will call you to review the results.   Testing/Procedures: NONE    Follow-Up: At [facility], you and your health needs are our priority.  As part of our continuing mission to provide you with exceptional heart care, we have created designated Provider Care Teams.  These Care Teams include your primary Cardiologist (physician) and Advanced Practice Providers (APPs -  Physician Assistants and Nurse Practitioners) who all work together to provide you with the care you need, when you need it.  We recommend signing up for the patient portal called "MyChart".  Sign up information is provided on this After Visit Summary.  MyChart is used to connect with patients for Virtual Visits (Telemedicine).  Patients are able to view lab/test results, encounter notes, upcoming appointments, etc.  Non-urgent messages can be sent to your provider as well.   To learn more about what you can do with MyChart, go to [email or web address].    Your next appointment:    To Be Determined   The format for your next appointment:   In Person  Provider:   Paula Ross, MD   Other Instructions Thank you for choosing [facility]!    

## 2021-11-20 ENCOUNTER — Telehealth: Payer: Self-pay | Admitting: Internal Medicine

## 2021-11-20 ENCOUNTER — Other Ambulatory Visit (HOSPITAL_COMMUNITY): Payer: Self-pay

## 2021-11-20 ENCOUNTER — Other Ambulatory Visit: Payer: Self-pay | Admitting: Internal Medicine

## 2021-11-20 DIAGNOSIS — R079 Chest pain, unspecified: Secondary | ICD-10-CM

## 2021-11-20 DIAGNOSIS — R0789 Other chest pain: Secondary | ICD-10-CM

## 2021-11-20 MED ORDER — PROLIA 60 MG/ML ~~LOC~~ SOSY
60.0000 mg | PREFILLED_SYRINGE | SUBCUTANEOUS | 1 refills | Status: DC
  Filled 2021-11-20 – 2021-12-05 (×2): qty 1, 180d supply, fill #0

## 2021-11-20 NOTE — Telephone Encounter (Signed)
Patient needs to be set up for CT coronary angiogram   Hx chest tightness and DOE     ?

## 2021-11-21 ENCOUNTER — Other Ambulatory Visit (HOSPITAL_COMMUNITY): Payer: Self-pay

## 2021-11-22 MED ORDER — METOPROLOL TARTRATE 100 MG PO TABS: ORAL_TABLET | ORAL | 0 refills | Status: DC

## 2021-11-22 NOTE — Addendum Note (Signed)
Addended by: Christella Scheuermann C on: 11/22/2021 10:25 AM ? ? Modules accepted: Orders ? ?

## 2021-11-22 NOTE — Telephone Encounter (Signed)
Left message to return call 

## 2021-11-22 NOTE — Telephone Encounter (Signed)
Orders for Cardiac CT, metoprolol 100 mg tablet (x1) and BMET placed ?

## 2021-11-23 NOTE — Telephone Encounter (Signed)
I spoke with patient and she had already scheduled her cardiac ct. Instructions discussed with her, sent through [facility]. ?

## 2021-11-27 ENCOUNTER — Other Ambulatory Visit (HOSPITAL_COMMUNITY): Payer: Self-pay

## 2021-11-30 ENCOUNTER — Other Ambulatory Visit (HOSPITAL_COMMUNITY)
Admission: RE | Admit: 2021-11-30 | Discharge: 2021-11-30 | Disposition: A | Discharge status: Home / Self Care | Payer: 59 | Source: Ambulatory Visit | Attending: Internal Medicine | Admitting: Internal Medicine

## 2021-11-30 ENCOUNTER — Other Ambulatory Visit (HOSPITAL_COMMUNITY): Payer: Self-pay | Admitting: *Deleted

## 2021-11-30 DIAGNOSIS — R0789 Other chest pain: Secondary | ICD-10-CM | POA: Diagnosis present

## 2021-11-30 LAB — BASIC METABOLIC PANEL
Anion gap: 11 (ref 5–15)
BUN: 15 mg/dL (ref 8–23)
CO2: 23 mmol/L (ref 22–32)
Calcium: 9.2 mg/dL (ref 8.9–10.3)
Chloride: 106 mmol/L (ref 98–111)
Creatinine, Ser: 0.92 mg/dL (ref 0.44–1.00)
GFR, Estimated: >60 mL/min (ref >60)
Glucose, Bld: 194 mg/dL — ABNORMAL HIGH (ref 70–99)
Potassium: 3.7 mmol/L (ref 3.5–5.1)
Sodium: 140 mmol/L (ref 135–145)

## 2021-12-03 ENCOUNTER — Other Ambulatory Visit: Payer: Self-pay | Admitting: Nurse Practitioner

## 2021-12-04 ENCOUNTER — Ambulatory Visit (HOSPITAL_COMMUNITY): Admission: RE | Admit: 2021-12-04 | Payer: 59 | Source: Ambulatory Visit

## 2021-12-04 ENCOUNTER — Ambulatory Visit (HOSPITAL_COMMUNITY)
Admission: RE | Admit: 2021-12-04 | Discharge: 2021-12-04 | Disposition: A | Discharge status: Home / Self Care | Payer: 59 | Source: Ambulatory Visit | Attending: Internal Medicine | Admitting: Internal Medicine

## 2021-12-04 ENCOUNTER — Other Ambulatory Visit: Payer: Self-pay

## 2021-12-04 DIAGNOSIS — R079 Chest pain, unspecified: Secondary | ICD-10-CM | POA: Diagnosis present

## 2021-12-04 DIAGNOSIS — R0789 Other chest pain: Secondary | ICD-10-CM | POA: Diagnosis present

## 2021-12-04 MED ORDER — SODIUM CHLORIDE 0.9 % IV BOLUS
250.0000 mL | Freq: Once | INTRAVENOUS | Status: AC
  Administered 2021-12-04: 250 mL via INTRAVENOUS

## 2021-12-04 MED ORDER — NITROGLYCERIN 0.4 MG SL SUBL
0.8000 mg | SUBLINGUAL_TABLET | Freq: Once | SUBLINGUAL | Status: AC
  Administered 2021-12-04: 0.8 mg via SUBLINGUAL

## 2021-12-04 MED ORDER — IOHEXOL 350 MG/ML SOLN
95.0000 mL | Freq: Once | INTRAVENOUS | Status: AC | PRN
  Administered 2021-12-04: 95 mL via INTRAVENOUS

## 2021-12-04 MED ORDER — NITROGLYCERIN 0.4 MG SL SUBL
SUBLINGUAL_TABLET | SUBLINGUAL | Status: AC
  Filled 2021-12-04: qty 2

## 2021-12-04 NOTE — Progress Notes (Signed)
Dr Johnsie Cancel called at 1402 and notified re chest pain/pressure and low BP following nitro and scan and that patient is receiving bolus, no new orders received. SBP improved post bolus and ginger ale. Is starting to feel better. ?

## 2021-12-04 NOTE — Progress Notes (Signed)
At 1430 patient felt like she could go home but when stood her up to check standing BP she felt dizzy and had to sit down. Her chest, neck back pressure is 3/10 as pre test.  ?SBP 112 when sitting. Started 2nd bolus of NS 250cc and will reassess when done. ?

## 2021-12-04 NOTE — Progress Notes (Signed)
Discharged in w/c to be driven home by husband. Feels fine. ?

## 2021-12-04 NOTE — Progress Notes (Signed)
Patient had 3/10 chest pressure type pain that starts in neck goes to L ear down left back and left chest pre test that has been ongoing for a week and 1/2. Post test feels dizzy, and has a headache and chest pain is 8/10. SBP 88. Taken back to nurses station and in a recliner. SBP 91 and then 99. Color pink and skin warm and dry. NS 250 started at 999cc per hr. And given ginger ale and cookies. Husband is sitting with her. Still dizzy. ?

## 2021-12-06 ENCOUNTER — Other Ambulatory Visit (HOSPITAL_COMMUNITY): Payer: Self-pay

## 2021-12-06 ENCOUNTER — Other Ambulatory Visit: Payer: Self-pay | Admitting: *Deleted

## 2021-12-06 ENCOUNTER — Encounter: Payer: Self-pay | Admitting: Internal Medicine

## 2021-12-06 MED ORDER — PROLIA 60 MG/ML ~~LOC~~ SOSY: 60.0000 mg | PREFILLED_SYRINGE | SUBCUTANEOUS | 1 refills | Status: DC

## 2021-12-07 ENCOUNTER — Encounter: Payer: Self-pay | Admitting: Internal Medicine

## 2021-12-07 DIAGNOSIS — R0609 Other forms of dyspnea: Secondary | ICD-10-CM

## 2021-12-07 DIAGNOSIS — Z01810 Encounter for preprocedural cardiovascular examination: Secondary | ICD-10-CM

## 2021-12-10 NOTE — Telephone Encounter (Signed)
I spoke wit the pt and advised her that I placed the order for the Cardiac MRI and she will need a CBC prior... I will forward to the [facility] office for review.  ?

## 2021-12-10 NOTE — Telephone Encounter (Signed)
Called patient as she sent msg in  REviewed CT findings    ?Discussed steatosis  ?Pt needs an MRI to eval for HOCM ? ?

## 2021-12-11 ENCOUNTER — Other Ambulatory Visit: Payer: Self-pay

## 2021-12-11 ENCOUNTER — Encounter: Payer: Self-pay | Admitting: Nurse Practitioner

## 2021-12-11 DIAGNOSIS — R0609 Other forms of dyspnea: Secondary | ICD-10-CM

## 2021-12-11 DIAGNOSIS — E1140 Type 2 diabetes mellitus with diabetic neuropathy, unspecified: Secondary | ICD-10-CM

## 2021-12-11 DIAGNOSIS — Z01810 Encounter for preprocedural cardiovascular examination: Secondary | ICD-10-CM

## 2021-12-14 ENCOUNTER — Encounter

## 2021-12-14 MED ORDER — ONETOUCH DELICA PLUS LANCET 33 GAUGE
33 gauge | 3 refills | Status: AC
Start: 2021-12-14 — End: ?

## 2021-12-14 MED ORDER — ONETOUCH ULTRA TEST STRIPS
ORAL_STRIP | 3 refills | Status: AC
Start: 2021-12-14 — End: ?

## 2021-12-19 ENCOUNTER — Ambulatory Visit: Payer: 59 | Admitting: Nutrition

## 2021-12-20 ENCOUNTER — Encounter: Payer: 59 | Attending: Nurse Practitioner | Admitting: Nutrition

## 2021-12-20 VITALS — Ht 62.0 in | Wt 201.0 lb

## 2021-12-20 DIAGNOSIS — K760 Fatty (change of) liver, not elsewhere classified: Secondary | ICD-10-CM

## 2021-12-20 DIAGNOSIS — K900 Celiac disease: Secondary | ICD-10-CM

## 2021-12-20 DIAGNOSIS — E1140 Type 2 diabetes mellitus with diabetic neuropathy, unspecified: Secondary | ICD-10-CM | POA: Insufficient documentation

## 2021-12-20 DIAGNOSIS — E782 Mixed hyperlipidemia: Secondary | ICD-10-CM

## 2021-12-20 DIAGNOSIS — Z713 Dietary counseling and surveillance: Secondary | ICD-10-CM | POA: Diagnosis not present

## 2021-12-20 DIAGNOSIS — I10 Essential (primary) hypertension: Secondary | ICD-10-CM

## 2021-12-20 NOTE — Progress Notes (Signed)
Medical Nutrition Therapy  ?Appointment Start time:  [id number]  Appointment End time:  1100 ? ?Primary concerns today: DM Type 2, Obesity, Celiac Disease  ?Referral diagnosis: DE11.8, E66.9, ?Preferred learning style: No preference ?Learning readiness: Ready  ? ? ?NUTRITION ASSESSMENT  ?49 y old wfemale with multiple GI issues and Diabetes. ?A1C 7.1%. She is avoiding gluten most of the time. Sometimes has gluten containing products. ?Trying to eat better to get her DM down. Has sleep apnea but doesn't get good sleep. Will get sometime to come evaluate her machine. ?FBS 169 mg/dl., ? ? ? ? ?Anthropometrics  ?Wt Readings from Last 3 Encounters:  ?12/20/21 201 lb (91.2 kg)  ?11/15/21 205 lb 1.6 oz (93 kg)  ?10/09/21 206 lb 3.2 oz (93.5 kg)  ? ?Ht Readings from Last 3 Encounters:  ?12/20/21 5\' 2"  (1.575 m)  ?11/15/21 5\' 2"  (1.575 m)  ?10/09/21 5\' 2"  (1.575 m)  ? ?Body mass index is 36.76 kg/m?. ?@BMIFA @ ?Facility age limit for growth percentiles is 20 years. ?Facility age limit for growth percentiles is 20 years. ?Lab Results  ?Component Value Date  ? HGBA1C 7.1 (H) 10/02/2021  ? ?  ? ?Clinical ?Medical Hx: see chart ?Medications: Ozempic ?Labs:  ?Lab Results  ?Component Value Date  ? HGBA1C 7.1 (H) 10/02/2021  ? ? ?  Latest Ref Rng & Units 11/30/2021  ?  1:14 PM 10/02/2021  ?  4:27 PM  ?CMP  ?Glucose 70 - 99 mg/dL 194   114    ?BUN 8 - 23 mg/dL 15   13    ?Creatinine 0.44 - 1.00 mg/dL 0.92   1.04    ?Sodium 135 - 145 mmol/L 140   144    ?Potassium 3.5 - 5.1 mmol/L 3.7   5.0    ?Chloride 98 - 111 mmol/L 106   104    ?CO2 22 - 32 mmol/L 23   21    ?Calcium 8.9 - 10.3 mg/dL 9.2   10.2    ?Total Protein 6.0 - 8.5 g/dL  6.8    ?Total Bilirubin 0.0 - 1.2 mg/dL  0.5    ?Alkaline Phos 44 - 121 IU/L  123    ?AST 0 - 40 IU/L  35    ?ALT 0 - 32 IU/L  50    ? ?Lipid Panel  ?   ?Component Value Date/Time  ? CHOL 184 10/02/2021 1626  ? TRIG 137 10/02/2021 1626  ? HDL 51 10/02/2021 1626  ? CHOLHDL 3.6 10/02/2021 1626  ? LDLCALC 109 (H)  10/02/2021 1626  ? LABVLDL 24 10/02/2021 1626  ? ? ?Notable Signs/Symptoms: NOne ? ?Lifestyle & Dietary Hx ?Lives with her husband and she does the cooking and shopppng. ?Just diagnosed with fatty liver. ? ?Estimated daily fluid intake: 64  oz ?Supplements: none ?Sleep: 4-5 ?Stress / self-care: no ?Current average weekly physical activity: ADL  ? ?24-Hr Dietary Recall ?First Meal: Oattmeal, raisin 1 tbsp, coffee- 1/2 and 1/2 ?Snack:  ?Second Meal: apple ?Snack:  ?Third Meal: [place] tenderloin, rice and spinach, water,  ?Snack:  ?Beverages: water -64 oz ? ?Estimated Energy Needs ?Calories: 1200 ?Carbohydrate: 135g ?Protein: 90g ?Fat: 33g ? ? ?NUTRITION DIAGNOSIS  ?NB-1.1 Food and nutrition-related knowledge deficit As related to Diabetes and Obesity.  As evidenced by A1C 7,1% and BMI > 30. ? ? ?NUTRITION INTERVENTION  ?Nutrition education (E-1) on the following topics:  ?Nutrition and Diabetes education provided on My Plate, CHO counting, meal planning, portion sizes, timing of meals, avoiding  snacks between meals unless having a low blood sugar, target ranges for A1C and blood sugars, signs/symptoms and treatment of hyper/hypoglycemia, monitoring blood sugars, taking medications as prescribed, benefits of exercising 30 minutes per day and prevention of complications of DM. ?Lifestyle Medicine ?- Whole Food, Plant Predominant Nutrition is highly recommended: Eat Plenty of vegetables, Mushrooms, fruits, Legumes, Whole Grains, Nuts, seeds in lieu of processed meats, processed snacks/pastries red meat, poultry, eggs.  ?  ?-It is better to avoid simple carbohydrates including: Cakes, Sweet Desserts, Ice Cream, Soda (diet and regular), Sweet Tea, Candies, Chips, Cookies, Store Bought Juices, Alcohol in Excess of  1-2 drinks a day, Lemonade,  Artificial Sweeteners, Doughnuts, Coffee Creamers, "Sugar-free" Products, etc, etc.  This is not a complete list..... ? ?Exercise: If you are able: 30 -60 minutes a day ,4 days a  week, or 150 minutes a week.  The longer the better.  Combine stretch, strength, and aerobic activities.  If you were told in the past that you have high risk for cardiovascular diseases, you may seek evaluation by your heart doctor prior to initiating moderate to intense exercise programs ? ?Handouts Provided Include  ?Lifestyle medicine ?Meal Plan Card ?Lifestyle nutrition ? ? ?Learning Style & Readiness for Change ?Teaching method utilized: Visual & Auditory  ?Demonstrated degree of understanding via: Teach Back  ?Barriers to learning/adherence to lifestyle change: none ? ?Goals Established by Pt ?Goals ? ?Eat Three meals per day at times discussed ?Don't eat past 7 pm ?Don't skip meals ?Increase low carb vegetables with lunch and dinner ?Increase walking to 5 minutes three times per day. ?Follow up equipment for your sleep apnea. ?Test BS a few times a week to see what foods effect blood sugars better or worse. ? ? ?MONITORING & EVALUATION ?Dietary intake, weekly physical activity, and weight and her blood sugars  in 1 month. ? ?Next Steps  ?Patient is to work on meal planning eating more whole food plant based foods.. ? ?

## 2021-12-20 NOTE — Patient Instructions (Signed)
Goals ? ?Eat Three meals per day at times discussed ?Don't eat past 7 pm ?Don't skip meals ?Increase low carb vegetables with lunch and dinner ?Increase walking to 5 minutes three times per day. ?Follow up equipment for your sleep apnea. ?Test BS a few times a week to see what foods effect blood sugars better or worse. ? ? ?

## 2022-01-01 ENCOUNTER — Other Ambulatory Visit: Payer: Self-pay | Admitting: Internal Medicine

## 2022-01-01 DIAGNOSIS — F418 Other specified anxiety disorders: Secondary | ICD-10-CM

## 2022-01-07 ENCOUNTER — Telehealth: Payer: Self-pay | Admitting: Internal Medicine

## 2022-01-07 ENCOUNTER — Ambulatory Visit (INDEPENDENT_AMBULATORY_CARE_PROVIDER_SITE_OTHER): Payer: 59 | Admitting: Nurse Practitioner

## 2022-01-07 ENCOUNTER — Ambulatory Visit (INDEPENDENT_AMBULATORY_CARE_PROVIDER_SITE_OTHER): Payer: 59 | Admitting: *Deleted

## 2022-01-07 ENCOUNTER — Encounter: Payer: Self-pay | Admitting: Nurse Practitioner

## 2022-01-07 ENCOUNTER — Other Ambulatory Visit (HOSPITAL_COMMUNITY)
Admission: RE | Admit: 2022-01-07 | Discharge: 2022-01-07 | Disposition: A | Discharge status: Home / Self Care | Payer: 59 | Source: Ambulatory Visit | Attending: Internal Medicine | Admitting: Internal Medicine

## 2022-01-07 VITALS — BP 122/75 | HR 83 | Ht 62.0 in | Wt 200.0 lb

## 2022-01-07 DIAGNOSIS — E1140 Type 2 diabetes mellitus with diabetic neuropathy, unspecified: Secondary | ICD-10-CM

## 2022-01-07 DIAGNOSIS — Z01810 Encounter for preprocedural cardiovascular examination: Secondary | ICD-10-CM | POA: Insufficient documentation

## 2022-01-07 DIAGNOSIS — I10 Essential (primary) hypertension: Secondary | ICD-10-CM

## 2022-01-07 DIAGNOSIS — E782 Mixed hyperlipidemia: Secondary | ICD-10-CM | POA: Diagnosis not present

## 2022-01-07 DIAGNOSIS — M810 Age-related osteoporosis without current pathological fracture: Secondary | ICD-10-CM | POA: Diagnosis not present

## 2022-01-07 DIAGNOSIS — R0609 Other forms of dyspnea: Secondary | ICD-10-CM | POA: Diagnosis present

## 2022-01-07 LAB — POCT GLYCOSYLATED HEMOGLOBIN (HGB A1C): HbA1c, POC (controlled diabetic range): 6.6 % (ref 0.0–7.0)

## 2022-01-07 LAB — CBC
HCT: 45.5 % (ref 36.0–46.0)
Hemoglobin: 15.6 g/dL — ABNORMAL HIGH (ref 12.0–15.0)
MCH: 32.8 pg (ref 26.0–34.0)
MCHC: 34.3 g/dL (ref 30.0–36.0)
MCV: 95.8 fL (ref 80.0–100.0)
Platelets: 347 10*3/uL (ref 150–400)
RBC: 4.75 MIL/uL (ref 3.87–5.11)
RDW: 12.7 % (ref 11.5–15.5)
WBC: 8.3 10*3/uL (ref 4.0–10.5)
nRBC: 0 % (ref 0.0–0.2)

## 2022-01-07 MED ORDER — ONETOUCH DELICA PLUS LANCET33G MISC: 6 refills | Status: AC

## 2022-01-07 MED ORDER — DENOSUMAB 60 MG/ML ~~LOC~~ SOSY
60.0000 mg | PREFILLED_SYRINGE | Freq: Once | SUBCUTANEOUS | Status: AC
  Administered 2022-01-07: 60 mg via SUBCUTANEOUS

## 2022-01-07 MED ORDER — ONETOUCH ULTRA VI STRP: ORAL_STRIP | 6 refills | Status: DC

## 2022-01-07 NOTE — Telephone Encounter (Signed)
PERCERT: ? ?MR CARD MORPHOLOGY WO/W CM ? ?01/14/2022 @ [facility] ?

## 2022-01-07 NOTE — Patient Instructions (Signed)
Diabetes Mellitus and Foot Care Foot care is an important part of your health, especially when you have diabetes. Diabetes may cause you to have problems because of poor blood flow (circulation) to your feet and legs, which can cause your skin to: Become thinner and drier. Break more easily. Heal more slowly. Peel and crack. You may also have nerve damage (neuropathy) in your legs and feet, causing decreased feeling in them. This means that you may not notice minor injuries to your feet that could lead to more serious problems. Noticing and addressing any potential problems early is the best way to prevent future foot problems. How to care for your feet Foot hygiene  Wash your feet daily with warm water and mild soap. Do not use hot water. Then, pat your feet and the areas between your toes until they are completely dry. Do not soak your feet as this can dry your skin. Trim your toenails straight across. Do not dig under them or around the cuticle. File the edges of your nails with an emery board or nail file. Apply a moisturizing lotion or petroleum jelly to the skin on your feet and to dry, brittle toenails. Use lotion that does not contain alcohol and is unscented. Do not apply lotion between your toes. Shoes and socks Wear clean socks or stockings every day. Make sure they are not too tight. Do not wear knee-high stockings since they may decrease blood flow to your legs. Wear shoes that fit properly and have enough cushioning. Always look in your shoes before you put them on to be sure there are no objects inside. To break in new shoes, wear them for just a few hours a day. This prevents injuries on your feet. Wounds, scrapes, corns, and calluses  Check your feet daily for blisters, cuts, bruises, sores, and redness. If you cannot see the bottom of your feet, use a mirror or ask someone for help. Do not cut corns or calluses or try to remove them with medicine. If you find a minor scrape,  cut, or break in the skin on your feet, keep it and the skin around it clean and dry. You may clean these areas with mild soap and water. Do not clean the area with peroxide, alcohol, or iodine. If you have a wound, scrape, corn, or callus on your foot, look at it several times a day to make sure it is healing and not infected. Check for: Redness, swelling, or pain. Fluid or blood. Warmth. Pus or a bad smell. General tips Do not cross your legs. This may decrease blood flow to your feet. Do not use heating pads or hot water bottles on your feet. They may burn your skin. If you have lost feeling in your feet or legs, you may not know this is happening until it is too late. Protect your feet from hot and cold by wearing shoes, such as at the beach or on hot pavement. Schedule a complete foot exam at least once a year (annually) or more often if you have foot problems. Report any cuts, sores, or bruises to your health care provider immediately. Where to find more information American Diabetes Association: www.diabetes.org Association of Diabetes Care & Education Specialists: www.diabeteseducator.org Contact a health care provider if: You have a medical condition that increases your risk of infection and you have any cuts, sores, or bruises on your feet. You have an injury that is not healing. You have redness on your legs or feet. You   feel burning or tingling in your legs or feet. You have pain or cramps in your legs and feet. Your legs or feet are numb. Your feet always feel cold. You have pain around any toenails. Get help right away if: You have a wound, scrape, corn, or callus on your foot and: You have pain, swelling, or redness that gets worse. You have fluid or blood coming from the wound, scrape, corn, or callus. Your wound, scrape, corn, or callus feels warm to the touch. You have pus or a bad smell coming from the wound, scrape, corn, or callus. You have a fever. You have a red  line going up your leg. Summary Check your feet every day for blisters, cuts, bruises, sores, and redness. Apply a moisturizing lotion or petroleum jelly to the skin on your feet and to dry, brittle toenails. Wear shoes that fit properly and have enough cushioning. If you have foot problems, report any cuts, sores, or bruises to your health care provider immediately. Schedule a complete foot exam at least once a year (annually) or more often if you have foot problems. This information is not intended to replace advice given to you by your health care provider. Make sure you discuss any questions you have with your health care provider. Document Revised: 03/23/2020 Document Reviewed: 03/23/2020 Elsevier Patient Education  2023 [employer]  

## 2022-01-07 NOTE — Progress Notes (Signed)
? ?                                                    Endocrinology Follow Up Note  ?     01/07/2022, 2:22 PM ? ? ?Subjective:  ? ? Patient ID: Monique Warren, female    DOB: [birth year].  ?Monique Warren is being seen in follow up after being seen in consultation for management of currently uncontrolled symptomatic diabetes requested by  Lindell Spar, MD. ? ? ?Past Medical History:  ?Diagnosis Date  ? Arthritis   ? Asthma   ? Celiac disease   ? Diabetes mellitus, type II ([place])   ? Diabetic neuropathy ([place])   ? Heart murmur   ? Hiatal hernia   ? Sliding  ? Hyperlipidemia   ? Hypertension   ? Migraine   ? Obstructive sleep apnea   ? Osteoporosis   ? Vertigo   ? ? ?Past Surgical History:  ?Procedure Laterality Date  ? ABDOMINAL HYSTERECTOMY  09/29/2003  ? APPENDECTOMY    ? BREAST BIOPSY Left   ? CARPAL TUNNEL RELEASE    ? CATARACT EXTRACTION Bilateral   ? October and November 2016  ? CRANIOTOMY FOR ANEURYSM / VERTEBROBASILAR / CAROTID CIRCULATION Right 10/24/2015  ? LAPAROSCOPY    ? UTERINE FIBROID SURGERY    ? ? ?Social History  ? ?Socioeconomic History  ? Marital status: Married  ?  Spouse name: Not on file  ? Number of children: Not on file  ? Years of education: Not on file  ? Highest education level: Not on file  ?Occupational History  ? Not on file  ?Tobacco Use  ? Smoking status: Former  ?  Packs/day: 0.50  ?  Types: Cigarettes  ? Smokeless tobacco: Never  ?Vaping Use  ? Vaping Use: Never used  ?Substance and Sexual Activity  ? Alcohol use: Yes  ?  Alcohol/week: 4.0 standard drinks  ?  Types: 2 Glasses of wine, 2 Shots of liquor per week  ?  Comment: on the weekends  ? Drug use: Never  ? Sexual activity: Not on file  ?Other Topics Concern  ? Not on file  ?Social History Narrative  ? Not on file  ? ?Social Determinants of Health  ? ?Financial Resource Strain: Not on file  ?Food Insecurity: Not on file  ?Transportation Needs: Not on file  ?Physical Activity: Not on file  ?Stress: Not on  file  ?Social Connections: Not on file  ? ? ?Family History  ?Problem Relation Age of Onset  ? Breast cancer Mother   ? Thyroid disease Mother   ? Stroke Mother   ? Hypertension Father   ? Hyperlipidemia Father   ? Heart attack Father   ? Heart failure Father   ? Breast cancer Paternal Grandmother   ? ? ?Outpatient Encounter Medications as of 01/07/2022  ?Medication Sig  ? AIMOVIG 140 MG/ML SOAJ INJECT 140 MG UNDER THE SKIN EVERY 30 DAYS  ? albuterol (VENTOLIN HFA) 108 (90 Base) MCG/ACT inhaler Inhale into the lungs.  ? atorvastatin (LIPITOR) 40 MG tablet Take 40 mg by mouth daily.  ? buPROPion (WELLBUTRIN XL) 300 MG 24 hr tablet TAKE 1 TABLET DAILY  ? busPIRone (BUSPAR) 10 MG tablet TAKE 1 TABLET THREE TIMES A DAY  ? diltiazem (CARDIZEM SR) 120 MG  12 hr capsule Take 2 capsules (240 mg total) by mouth daily.  ? DULoxetine (CYMBALTA) 60 MG capsule Take 60 mg by mouth daily.  ? Estradiol 10 MCG TABS vaginal tablet Place vaginally.  ? famotidine (PEPCID) 40 MG tablet TAKE 1 TABLET DAILY  ? fluticasone (FLOVENT HFA) 110 MCG/ACT inhaler Inhale into the lungs 2 (two) times daily.  ? lidocaine (XYLOCAINE) 5 % ointment lidocaine 5 % topical ointment  ? OZEMPIC, 1 MG/DOSE, 4 MG/3ML SOPN INJECT 1 MG ONCE A WEEK AS DIRECTED  ? polyethylene glycol powder (GLYCOLAX/MIRALAX) 17 GM/SCOOP powder Take 17 g by mouth 2 (two) times daily as needed for moderate constipation.  ? Probiotic Product (ENVIVE PO) Take by mouth.  ? PROLIA 60 MG/ML SOSY injection Inject 60 mg into the skin every 6 (six) months.  ? [DISCONTINUED] Lancets (ONETOUCH DELICA PLUS 123XX123) MISC Apply 1 each topically daily.  ? [DISCONTINUED] ONETOUCH ULTRA test strip   ? benzonatate (TESSALON) 100 MG capsule Take 1 capsule (100 mg total) by mouth 2 (two) times daily as needed for cough. (Patient not taking: Reported on 12/20/2021)  ? Lancets (ONETOUCH DELICA PLUS 123XX123) MISC Use to check glucose once daily  ? metoprolol tartrate (LOPRESSOR) 100 MG tablet Take one  (1) tablet by mouth two (2) hours prior to CT Scan (Patient not taking: Reported on 12/20/2021)  ? ONETOUCH ULTRA test strip Use to monitor glucose once daily  ? zolmitriptan (ZOMIG-ZMT) 5 MG disintegrating tablet Take by mouth. (Patient not taking: Reported on 12/20/2021)  ? ?No facility-administered encounter medications on file as of 01/07/2022.  ? ? ?ALLERGIES: ?Allergies  ?Allergen Reactions  ? Cefuroxime Anaphylaxis, Anxiety, Hives, Shortness Of Breath and Swelling  ?  Throat ?Throat ?Throat ?Throat almost closed off ?Throat ?Throat ?Throat ?  ? Codeine Anxiety, Hives, Nausea And Vomiting, Shortness Of Breath and Swelling  ?  Throat ?Throat ?throat ?throat ?Throat ?Throat ?  ? Sulfamethoxazole-Trimethoprim Anxiety, Shortness Of Breath and Swelling  ?  Throat swelling ?Throat swelling ?  ? Sulfa Antibiotics Swelling  ?  Swollen throat  ? ? ?VACCINATION STATUS: ?Immunization History  ?Administered Date(s) Administered  ? Fluad Quad(high Dose 65+) 07/02/2021  ? Influenza Split 07/26/2014  ? Influenza, High Dose Seasonal PF 09/22/2018  ? PFIZER(Purple Top)SARS-COV-2 Vaccination 12/01/2019, 12/29/2019  ? Tdap 09/30/2016  ? Zoster Recombinat (Shingrix) 10/03/2017, 06/03/2018  ? ? ?Diabetes ?She presents for her follow-up diabetic visit. She has type 2 diabetes mellitus. Onset time: Diagnosed at approx age of 46. Her disease course has been improving. There are no hypoglycemic associated symptoms. Associated symptoms include chest pain (ongoing work up with cardiology), foot paresthesias and weight loss. Pertinent negatives for diabetes include no polyuria. There are no hypoglycemic complications. Symptoms are stable. Diabetic complications include nephropathy and peripheral neuropathy. Risk factors for coronary artery disease include diabetes mellitus, dyslipidemia, family history, hypertension, obesity, post-menopausal and sedentary lifestyle. Current diabetic treatments: Ozempic 1 mg SQ weekly. She is compliant with  treatment all of the time. Her weight is stable. She is following a generally healthy diet. When asked about meal planning, she reported none. She has not had a previous visit with a dietitian. She participates in exercise intermittently. (She presents today with her meter and logs showing slightly above target fasting glycemic profile.  Her POCT A1c today is 6.6%, improving from last visit of 7.1%.  She has tolerated her Ozempic 1 mg dose well, eats less.  She denies any s/s of hypoglycemia.  She has ongoing work  up for chest pain with cardiology.) An ACE inhibitor/angiotensin II receptor blocker is being taken. She does not see a podiatrist.Eye exam is current (has eye exam later today).  ?Hyperlipidemia ?This is a chronic problem. The current episode started more than 1 year ago. The problem is uncontrolled. Recent lipid tests were reviewed and are high. Exacerbating diseases include chronic renal disease, diabetes and obesity. Factors aggravating her hyperlipidemia include fatty foods. Associated symptoms include chest pain (ongoing work up with cardiology). Current antihyperlipidemic treatment includes statins. The current treatment provides moderate improvement of lipids. There are no compliance problems.  Risk factors for coronary artery disease include diabetes mellitus, dyslipidemia, family history, obesity, hypertension, post-menopausal and a sedentary lifestyle.  ?Hypertension ?This is a chronic problem. The current episode started more than 1 year ago. The problem has been gradually improving since onset. The problem is controlled. Associated symptoms include chest pain (ongoing work up with cardiology). There are no associated agents to hypertension. Risk factors for coronary artery disease include diabetes mellitus, dyslipidemia, family history, obesity, post-menopausal state and sedentary lifestyle. Past treatments include ACE inhibitors. The current treatment provides moderate improvement. There are  no compliance problems.  Hypertensive end-organ damage includes kidney disease. Identifiable causes of hypertension include chronic renal disease and sleep apnea.  ? ? ?Review of systems ? ?[place]

## 2022-01-08 ENCOUNTER — Other Ambulatory Visit: Payer: Self-pay

## 2022-01-08 MED ORDER — DILTIAZEM HCL ER 120 MG PO CP12: 240.0000 mg | ORAL_CAPSULE | Freq: Every day | ORAL | 2 refills | Status: DC

## 2022-01-11 ENCOUNTER — Telehealth (HOSPITAL_COMMUNITY): Payer: Self-pay | Admitting: Emergency Medicine

## 2022-01-11 NOTE — Telephone Encounter (Signed)
Reaching out to patient to offer assistance regarding upcoming cardiac imaging study; pt verbalizes understanding of appt date/time, parking situation and where to check in, pre-test NPO status and medications ordered, and verified current allergies; name and call back number provided for further questions should they arise ?Marchia Bond RN Navigator Cardiac Imaging ?[facility] ?[phone] office ?[phone] cell ? ?Denies claustro ?Denies implants ?Denies iv issues ?Arrival 730 ? ?

## 2022-01-14 ENCOUNTER — Ambulatory Visit (HOSPITAL_COMMUNITY)
Admission: RE | Admit: 2022-01-14 | Discharge: 2022-01-14 | Disposition: A | Discharge status: Home / Self Care | Payer: 59 | Source: Ambulatory Visit | Attending: Internal Medicine | Admitting: Internal Medicine

## 2022-01-14 DIAGNOSIS — Z01810 Encounter for preprocedural cardiovascular examination: Secondary | ICD-10-CM

## 2022-01-14 DIAGNOSIS — R0609 Other forms of dyspnea: Secondary | ICD-10-CM | POA: Insufficient documentation

## 2022-01-14 MED ORDER — GADOBUTROL 1 MMOL/ML IV SOLN
10.0000 mL | Freq: Once | INTRAVENOUS | Status: AC | PRN
  Administered 2022-01-14: 10 mL via INTRAVENOUS

## 2022-01-15 NOTE — H&P (View-Only) (Signed)
Referring Provider: Lindell Spar, MD Primary Care Physician:  Lindell Spar, MD Primary Gastroenterologist:  Dr. Abbey Chatters  Chief Complaint  Patient presents with   Celiac Disease    Constipation, stomach pains lower     HPI:   Monique Warren is a 71 y.o. female presenting today at the request of Lindell Spar, MD for celiac disease.   EGD and colonoscopy in January 2019 with [name], MD at [facility] in [place].   Colonoscopy: 5 mm tubular adenoma in the descending colon s/p resected, mild sigmoid diverticulosis, internal hemorrhoids.  Recommended repeat colonoscopy in 5 years.  EGD for history of Barrett's esophagus: Irregular Z-line s/p biopsy, overall consistent with previously diagnosed Barrett's esophagus, decreased LES tone with 4 cm hiatal hernia, erythema in the gastric antrum s/p biopsy, normal examined duodenum s/p biopsy.  Recommended repeat EGD in 3 years. Esophageal biopsy with benign squamous and gastric junctional mucosa with reactive changes suggestive of reflux, no Barrett's, gastric biopsy with mild nonspecific chronic gastritis with intestinal metaplasia, duodenal biopsy with increased intraepithelial lymphocytes without blunting of villi which was relatively a nonspecific finding, but could be associated with celiac disease.  Recommended correlating with serologic studies and clinical features.  Today:  Moved here from [place] at the end of 2022.   Has history barretts.  Reflux well controlled on rabeprazole 20 mg twice daily and famotidine daily.   Feels foods stacking up in her chest as she is eating. This has been going on for about 6 months. Ozempic increased about 2 months ago and since then, if over eating, she will get nauseated and have breakthrough reflux.  No vomiting.  Early satiety present since increasing Ozempic. A1C improved to 6.6   Celiac disease:  Reports she had a blood test before her EGD that was negative.  Since  her EGD, she has been following gluten free diet and is doing well.  Reports her reflux symptoms previously were more severe and she had abdominal pain that improved with following a gluten-free diet.  Reports chronic history of constipation.  Some lower abdominal discomfort but she has gone several days without a bowel movement.  In general, bowels move about once a week.  States she has to take milk of magnesia to help her move her bowels.  She has tried MiraLAX daily which is not helpful.  Currently, stools are hard, require straining, and are incomplete.  Denies BRBPR, melena, or unintentional weight loss.  Recent TSH within normal limits this year.  Reports history of fatty liver.  Coronary CT completed in March 2023 with hepatic steatosis.  Alk phos and ALT slightly elevated in January 2023 at 123 and 50 respectively.   Reports chronic history of intermittent heart palpitations and some occasional chest discomfort.  Was thought this was secondary to anxiety previously.  In general, her symptoms are chronic and unchanged.  She is following with Dr. Harrington Challenger, cardiologist at [facility].    Grade 1 diastolic dysfunction with preserved EF.   Past Medical History:  Diagnosis Date   Arthritis    Asthma    Celiac disease    Diabetes mellitus, type II ([place])    Diabetic neuropathy ([facility])    Diastolic dysfunction    Grade 1 with preserved EF   Heart murmur    Hiatal hernia    Sliding   Hyperlipidemia    Hypertension    Migraine    Obstructive sleep apnea    Osteoporosis  Vertigo     Past Surgical History:  Procedure Laterality Date   ABDOMINAL HYSTERECTOMY  09/29/2003   APPENDECTOMY     BREAST BIOPSY Left    CARPAL TUNNEL RELEASE     CATARACT EXTRACTION Bilateral    October and November 2016   COLONOSCOPY  09/2017   [name], MD in [place]; 5 mm tubular adenoma in the descending colon s/p resected, mild sigmoid diverticulosis, internal hemorrhoids.  Recommended repeat colonoscopy in 5  years.   CRANIOTOMY FOR ANEURYSM / VERTEBROBASILAR / CAROTID CIRCULATION Right 10/24/2015   ESOPHAGOGASTRODUODENOSCOPY  09/2017   [name]; irregular Z-line s/p biopsy, decreased LES tone, 4 cm hiatal hernia, erythema in gastric antrum biopsied, normal examined duodenum biopsied.  Esophageal biopsy suggestive of reflux, no Barrett's, gastric biopsy with nonspecific chronic gastritis with intestinal metaplasia, duodenal biopsy with increased intraepithelial lymphocytes without blunting of villi, nonspecific.   LAPAROSCOPY     UTERINE FIBROID SURGERY      Current Outpatient Medications  Medication Sig Dispense Refill   AIMOVIG 140 MG/ML SOAJ INJECT 140 MG UNDER THE SKIN EVERY 30 DAYS 1 mL 11   albuterol (VENTOLIN HFA) 108 (90 Base) MCG/ACT inhaler Inhale into the lungs.     atorvastatin (LIPITOR) 40 MG tablet Take 40 mg by mouth daily.     buPROPion (WELLBUTRIN XL) 300 MG 24 hr tablet TAKE 1 TABLET DAILY 90 tablet 3   busPIRone (BUSPAR) 10 MG tablet TAKE 1 TABLET THREE TIMES A DAY 90 tablet 5   diltiazem (CARDIZEM SR) 120 MG 12 hr capsule Take 2 capsules (240 mg total) by mouth daily. 180 capsule 2   DULoxetine (CYMBALTA) 60 MG capsule Take 60 mg by mouth daily.     Estradiol 10 MCG TABS vaginal tablet Place vaginally.     famotidine (PEPCID) 40 MG tablet TAKE 1 TABLET DAILY 90 tablet 3   fluticasone (FLOVENT HFA) 110 MCG/ACT inhaler Inhale into the lungs 2 (two) times daily.     Lancets (ONETOUCH DELICA PLUS [id number]) MISC Use to check glucose once daily 100 each 6   ONETOUCH ULTRA test strip Use to monitor glucose once daily 100 each 6   OZEMPIC, 1 MG/DOSE, 4 MG/3ML SOPN INJECT 1 MG ONCE A WEEK AS DIRECTED 6 mL 0   polyethylene glycol powder (GLYCOLAX/MIRALAX) 17 GM/SCOOP powder Take 17 g by mouth 2 (two) times daily as needed for moderate constipation. 3350 g 1   Probiotic Product (ENVIVE PO) Take by mouth.     PROLIA 60 MG/ML SOSY injection Inject 60 mg into the skin every 6 (six) months.  1 mL 1   RABEprazole (ACIPHEX) 20 MG tablet Take 20 mg by mouth 2 (two) times daily.     No current facility-administered medications for this visit.    Allergies as of 01/16/2022 - Review Complete 01/16/2022  Allergen Reaction Noted   Cefuroxime Anaphylaxis, Anxiety, Hives, Shortness Of Breath, and Swelling 09/16/2006   Codeine Anxiety, Hives, Nausea And Vomiting, Shortness Of Breath, and Swelling 09/16/2006   Sulfamethoxazole-trimethoprim Anxiety, Shortness Of Breath, and Swelling 09/16/2006   Sulfa antibiotics Swelling 09/02/2017    Family History  Problem Relation Age of Onset   Breast cancer Mother    Thyroid disease Mother    Stroke Mother    Hypertension Father    Hyperlipidemia Father    Heart attack Father    Heart failure Father    Breast cancer Paternal Grandmother    Cancer - Colon Neg Hx    Gastric  cancer Neg Hx    Esophageal cancer Neg Hx     Social History   Socioeconomic History   Marital status: Married    Spouse name: Not on file   Number of children: Not on file   Years of education: Not on file   Highest education level: Not on file  Occupational History   Not on file  Tobacco Use   Smoking status: Former    Packs/day: 0.50    Types: Cigarettes   Smokeless tobacco: Never  Vaping Use   Vaping Use: Never used  Substance and Sexual Activity   Alcohol use: Yes    Alcohol/week: 4.0 standard drinks    Types: 2 Glasses of wine, 2 Shots of liquor per week    Comment: on the weekends   Drug use: Never   Sexual activity: Not on file  Other Topics Concern   Not on file  Social History Narrative   Not on file   Social Determinants of Health   Financial Resource Strain: Not on file  Food Insecurity: Not on file  Transportation Needs: Not on file  Physical Activity: Not on file  Stress: Not on file  Social Connections: Not on file  Intimate Partner Violence: Not on file    Review of Systems: Gen: Denies any fever, chills, cold or flu like  symptoms, pre-syncope, or syncope.  CV: See HPI Resp: Denies shortness of breath or cough.  GI: See HPI GU : Denies urinary burning, urinary frequency, urinary hesitancy MS: Admits to chronic back pain.  Derm: Denies rash. Psych: Denies depression, anxiety. Heme: See HPI  Physical Exam: BP 120/68   Pulse 80   Temp 97.6 F (36.4 C) (Temporal)   Ht _0  (1.575 m)   Wt 202 lb (91.6 kg)   BMI 36.95 kg/m  General:   Alert and oriented. Pleasant and cooperative. Well-nourished and well-developed.  Head:  Normocephalic and atraumatic. Eyes:  Without icterus, sclera clear and conjunctiva pink.  Ears:  Normal auditory acuity. Lungs:  Clear to auscultation bilaterally. No wheezes, rales, or rhonchi. No distress.  Heart:  S1, S2 present. Systolic murmur present.  Abdomen:  +BS, soft, non-tender and non-distended. No HSM noted. No guarding or rebound. No masses appreciated.  Rectal:  Deferred  Msk:  Symmetrical without gross deformities. Normal posture. Extremities:  Without edema. Neurologic:  Alert and  oriented x4;  grossly normal neurologically. Skin:  Intact without significant lesions or rashes. Psych:  Normal mood and affect.    Assessment:  71 year old female with history of type 2 diabetes, HTN, HLD, OSA, grade 1 diastolic dysfunction with preserved EF, suspected celiac disease, Barrett's esophagus, GERD, adenomatous colon polyps, presenting today to establish care reporting chronic constipation, dysphagia, early satiety.  Also discussed history of celiac disease, GERD, fatty liver, elevated LFTs.  Constipation: Chronic.  No alarm symptoms.  Reports trial of Linzess previously caused diarrhea though she is not sure of the dose that she was tried on.  MiraLAX is not helpful.  Currently using milk of magnesia as needed with symptoms uncontrolled.  Associated lower abdominal discomfort when she is gone several days without a bowel movement.  We will try her on Trulance 3 mg daily.   Samples provided.  Dysphagia: Reports sensation of items stacking up in her chest as she is eating x6 months.  Chronic GERD well controlled.  Last EGD in 2019 in [place] with reflux changes in the esophagus, no Barrett's.  She may have developed esophageal web,  ring, or stricture, unable to rule out malignancy, or motility disorder.  She needs EGD for further evaluation and therapeutic intervention.  GERD: Chronic.  Fairly well-controlled on rabeprazole 20 mg twice daily and famotidine daily.  History of Barrett's esophagus: Last EGD in January 2019 in [place] performed due to history of Barrett's esophagus which revealed an irregular Z-line.  Biopsies revealed changes suggestive of reflux, no Barrett's.  Patient states her provider told her that since she had so many biopsies in this area, they may have essentially gotten rid of Barrett's.  Overdue for surveillance EGD.   Early satiety: New onset early satiety since Ozempic has been increased.  Associated nausea and reflux if she overeats.  Suspect this is med effect rather than gastroparesis or gastric outlet obstruction.  As we are already pursuing an EGD, this will help to evaluate early satiety as well.  Celiac disease: Prior evaluation in [place].  Patient reports having blood work completed that was negative.  Follow-up EGD in January 2019 with normal examined duodenum, biopsies with increased intraepithelial lymphocytes without blunting of villi, relatively nonspecific, but could be associated with celiac disease.  She has been following a strict gluten-free diet since that time and reports resolution of abdominal pain.  Also reports her reflux symptoms improved with gluten-free diet.  As we will be pursuing an EGD in the near future for Barrett's surveillance, dysphagia, early satiety discussed above, would recommend rebiopsy her small intestine to confirm improvement in intraepithelial lymphocytes.  She will continue a gluten-free diet.     Fatty liver/elevated LFTs: Hepatic steatosis noted on coronary CT in March 2023.  Labs in January 2023 with alk phos slightly elevated at 123 and ALT slightly elevated at 50.  Platelets normal.  Hep C antibody negative.  Currently without signs or symptoms of decompensated liver disease.  Suspect slightly elevated liver enzymes likely secondary to fatty liver. Weekend alcohol use could be playing a role, but no significant alcohol use. No illicit drug use. We will go ahead and check hepatitis B serologies, evaluate for hepatitis A immunity, and screen for hemochromatosis.  We will also update HFP.  She was counseled on fatty liver we discussed the potential for progression to fibrosis and cirrhosis.  I encouraged referral to healthy weight and wellness to assist with weight loss, but patient declined.  History of adenomatous colon polyps: Due for surveillance colonoscopy in January 2024.    Plan:  HFP, hepatitis A antibody total, hepatitis B surface antibody, hepatitis B surface antigen, hepatitis B core antibody total, iron panel. Proceed with upper endoscopy +/- dilation with propofol by Dr. Abbey Chatters in near future. The risks, benefits, and alternatives have been discussed with the patient in detail. The patient states understanding and desires to proceed. ASA 2 No a.m. diabetes medications day of procedure Start Trulance 3 mg daily.  Samples provided.  Requested progress report in 1 week. Continue Aciphex 20 mg twice daily. Continue famotidine daily. Reinforced GERD diet/lifestyle.  Separate written instructions provided. Recommended 4-6 small meals daily. Continue gluten-free diet. Counseled on fatty liver and discussed the importance of weight loss through diet and exercise. Offered referral to healthy weight and wellness, but patient declined. Avoid alcohol use, over-the-counter supplements, and herbal teas. NIC for colonoscopy in January 2024. Follow-up after EGD.    [name],  PA-C [facility] 01/16/2022

## 2022-01-15 NOTE — Progress Notes (Signed)
? ?Referring Provider: Lindell Spar, MD ?Primary Care Physician:  Lindell Spar, MD ?Primary Gastroenterologist:  Dr. Abbey Chatters ? ?Chief Complaint  ?Patient presents with  ? Celiac Disease  ?  Constipation, stomach pains lower   ? ? ?HPI:   ?Monique Warren is a 71 y.o. female presenting today at the request of Lindell Spar, MD for celiac disease.  ? ?EGD and colonoscopy in January 2019 with [name], MD at [facility] in [place].  ? ?Colonoscopy: 5 mm tubular adenoma in the descending colon s/p resected, mild sigmoid diverticulosis, internal hemorrhoids.  Recommended repeat colonoscopy in 5 years. ? ?EGD for history of Barrett's esophagus: Irregular Z-line s/p biopsy, overall consistent with previously diagnosed Barrett's esophagus, decreased LES tone with 4 cm hiatal hernia, erythema in the gastric antrum s/p biopsy, normal examined duodenum s/p biopsy.  Recommended repeat EGD in 3 years. ?Esophageal biopsy with benign squamous and gastric junctional mucosa with reactive changes suggestive of reflux, no Barrett's, gastric biopsy with mild nonspecific chronic gastritis with intestinal metaplasia, duodenal biopsy with increased intraepithelial lymphocytes without blunting of villi which was relatively a nonspecific finding, but could be associated with celiac disease.  Recommended correlating with serologic studies and clinical features. ? ?Today:  ?Moved here from [place] at the end of 2022.  ? ?Has history barretts.  ?Reflux well controlled on rabeprazole 20 mg twice daily and famotidine daily.  ? ?Feels foods stacking up in her chest as she is eating. This has been going on for about 6 months. Ozempic increased about 2 months ago and since then, if over eating, she will get nauseated and have breakthrough reflux.  No vomiting.  Early satiety present since increasing Ozempic. A1C improved to 6.6  ? ?Celiac disease:  ?Reports she had a blood test before her EGD that was negative.  ?Since  her EGD, she has been following gluten free diet and is doing well.  Reports her reflux symptoms previously were more severe and she had abdominal pain that improved with following a gluten-free diet. ? ?Reports chronic history of constipation.  Some lower abdominal discomfort but she has gone several days without a bowel movement.  In general, bowels move about once a week.  States she has to take milk of magnesia to help her move her bowels.  She has tried MiraLAX daily which is not helpful.  Currently, stools are hard, require straining, and are incomplete.  Denies BRBPR, melena, or unintentional weight loss. ? ?Recent TSH within normal limits this year. ? ?Reports history of fatty liver.  Coronary CT completed in March 2023 with hepatic steatosis.  Alk phos and ALT slightly elevated in January 2023 at 123 and 50 respectively.  ? ?Reports chronic history of intermittent heart palpitations and some occasional chest discomfort.  Was thought this was secondary to anxiety previously.  In general, her symptoms are chronic and unchanged.  She is following with Dr. Harrington Challenger, cardiologist at [facility]   ? ?Grade 1 diastolic dysfunction with preserved EF.  ? ?Past Medical History:  ?Diagnosis Date  ? Arthritis   ? Asthma   ? Celiac disease   ? Diabetes mellitus, type II ([place])   ? Diabetic neuropathy ([place])   ? Diastolic dysfunction   ? Grade 1 with preserved EF  ? Heart murmur   ? Hiatal hernia   ? Sliding  ? Hyperlipidemia   ? Hypertension   ? Migraine   ? Obstructive sleep apnea   ? Osteoporosis   ?  Vertigo   ? ? ?Past Surgical History:  ?Procedure Laterality Date  ? ABDOMINAL HYSTERECTOMY  09/29/2003  ? APPENDECTOMY    ? BREAST BIOPSY Left   ? CARPAL TUNNEL RELEASE    ? CATARACT EXTRACTION Bilateral   ? October and November 2016  ? COLONOSCOPY  09/2017  ? [name], MD in [place]; 5 mm tubular adenoma in the descending colon s/p resected, mild sigmoid diverticulosis, internal hemorrhoids.  Recommended repeat colonoscopy in 5  years.  ? CRANIOTOMY FOR ANEURYSM / VERTEBROBASILAR / CAROTID CIRCULATION Right 10/24/2015  ? ESOPHAGOGASTRODUODENOSCOPY  09/2017  ? [place]; irregular Z-line s/p biopsy, decreased LES tone, 4 cm hiatal hernia, erythema in gastric antrum biopsied, normal examined duodenum biopsied.  Esophageal biopsy suggestive of reflux, no Barrett's, gastric biopsy with nonspecific chronic gastritis with intestinal metaplasia, duodenal biopsy with increased intraepithelial lymphocytes without blunting of villi, nonspecific.  ? LAPAROSCOPY    ? UTERINE FIBROID SURGERY    ? ? ?Current Outpatient Medications  ?Medication Sig Dispense Refill  ? AIMOVIG 140 MG/ML SOAJ INJECT 140 MG UNDER THE SKIN EVERY 30 DAYS 1 mL 11  ? albuterol (VENTOLIN HFA) 108 (90 Base) MCG/ACT inhaler Inhale into the lungs.    ? atorvastatin (LIPITOR) 40 MG tablet Take 40 mg by mouth daily.    ? buPROPion (WELLBUTRIN XL) 300 MG 24 hr tablet TAKE 1 TABLET DAILY 90 tablet 3  ? busPIRone (BUSPAR) 10 MG tablet TAKE 1 TABLET THREE TIMES A DAY 90 tablet 5  ? diltiazem (CARDIZEM SR) 120 MG 12 hr capsule Take 2 capsules (240 mg total) by mouth daily. 180 capsule 2  ? DULoxetine (CYMBALTA) 60 MG capsule Take 60 mg by mouth daily.    ? Estradiol 10 MCG TABS vaginal tablet Place vaginally.    ? famotidine (PEPCID) 40 MG tablet TAKE 1 TABLET DAILY 90 tablet 3  ? fluticasone (FLOVENT HFA) 110 MCG/ACT inhaler Inhale into the lungs 2 (two) times daily.    ? Lancets (ONETOUCH DELICA PLUS STMHDQ22W) MISC Use to check glucose once daily 100 each 6  ? ONETOUCH ULTRA test strip Use to monitor glucose once daily 100 each 6  ? OZEMPIC, 1 MG/DOSE, 4 MG/3ML SOPN INJECT 1 MG ONCE A WEEK AS DIRECTED 6 mL 0  ? polyethylene glycol powder (GLYCOLAX/MIRALAX) 17 GM/SCOOP powder Take 17 g by mouth 2 (two) times daily as needed for moderate constipation. 3350 g 1  ? Probiotic Product (ENVIVE PO) Take by mouth.    ? PROLIA 60 MG/ML SOSY injection Inject 60 mg into the skin every 6 (six) months.  1 mL 1  ? RABEprazole (ACIPHEX) 20 MG tablet Take 20 mg by mouth 2 (two) times daily.    ? ?No current facility-administered medications for this visit.  ? ? ?Allergies as of 01/16/2022 - Review Complete 01/16/2022  ?Allergen Reaction Noted  ? Cefuroxime Anaphylaxis, Anxiety, Hives, Shortness Of Breath, and Swelling 09/16/2006  ? Codeine Anxiety, Hives, Nausea And Vomiting, Shortness Of Breath, and Swelling 09/16/2006  ? Sulfamethoxazole-trimethoprim Anxiety, Shortness Of Breath, and Swelling 09/16/2006  ? Sulfa antibiotics Swelling 09/02/2017  ? ? ?Family History  ?Problem Relation Age of Onset  ? Breast cancer Mother   ? Thyroid disease Mother   ? Stroke Mother   ? Hypertension Father   ? Hyperlipidemia Father   ? Heart attack Father   ? Heart failure Father   ? Breast cancer Paternal Grandmother   ? Cancer - Colon Neg Hx   ? Gastric  cancer Neg Hx   ? Esophageal cancer Neg Hx   ? ? ?Social History  ? ?Socioeconomic History  ? Marital status: Married  ?  Spouse name: Not on file  ? Number of children: Not on file  ? Years of education: Not on file  ? Highest education level: Not on file  ?Occupational History  ? Not on file  ?Tobacco Use  ? Smoking status: Former  ?  Packs/day: 0.50  ?  Types: Cigarettes  ? Smokeless tobacco: Never  ?Vaping Use  ? Vaping Use: Never used  ?Substance and Sexual Activity  ? Alcohol use: Yes  ?  Alcohol/week: 4.0 standard drinks  ?  Types: 2 Glasses of wine, 2 Shots of liquor per week  ?  Comment: on the weekends  ? Drug use: Never  ? Sexual activity: Not on file  ?Other Topics Concern  ? Not on file  ?Social History Narrative  ? Not on file  ? ?Social Determinants of Health  ? ?Financial Resource Strain: Not on file  ?Food Insecurity: Not on file  ?Transportation Needs: Not on file  ?Physical Activity: Not on file  ?Stress: Not on file  ?Social Connections: Not on file  ?Intimate Partner Violence: Not on file  ? ? ?Review of Systems: ?Gen: Denies any fever, chills, cold or flu like  symptoms, pre-syncope, or syncope.  ?CV: See HPI ?Resp: Denies shortness of breath or cough.  ?GI: See HPI ?GU : Denies urinary burning, urinary frequency, urinary hesitancy ?MS: Admits to chronic back

## 2022-01-16 ENCOUNTER — Encounter: Payer: Self-pay | Admitting: Gastroenterology

## 2022-01-16 ENCOUNTER — Ambulatory Visit (INDEPENDENT_AMBULATORY_CARE_PROVIDER_SITE_OTHER): Payer: 59 | Admitting: Gastroenterology

## 2022-01-16 ENCOUNTER — Telehealth: Payer: Self-pay

## 2022-01-16 ENCOUNTER — Encounter: Payer: Self-pay | Admitting: Nutrition

## 2022-01-16 VITALS — BP 120/68 | HR 80 | Temp 97.6°F | Ht 62.0 in | Wt 202.0 lb

## 2022-01-16 DIAGNOSIS — R7989 Other specified abnormal findings of blood chemistry: Secondary | ICD-10-CM

## 2022-01-16 DIAGNOSIS — K219 Gastro-esophageal reflux disease without esophagitis: Secondary | ICD-10-CM

## 2022-01-16 DIAGNOSIS — Z860100 Personal history of colon polyps, unspecified: Secondary | ICD-10-CM | POA: Insufficient documentation

## 2022-01-16 DIAGNOSIS — K760 Fatty (change of) liver, not elsewhere classified: Secondary | ICD-10-CM

## 2022-01-16 DIAGNOSIS — R6881 Early satiety: Secondary | ICD-10-CM

## 2022-01-16 DIAGNOSIS — R1310 Dysphagia, unspecified: Secondary | ICD-10-CM

## 2022-01-16 DIAGNOSIS — K2270 Barrett's esophagus without dysplasia: Secondary | ICD-10-CM

## 2022-01-16 DIAGNOSIS — K5904 Chronic idiopathic constipation: Secondary | ICD-10-CM | POA: Diagnosis not present

## 2022-01-16 DIAGNOSIS — K900 Celiac disease: Secondary | ICD-10-CM | POA: Diagnosis not present

## 2022-01-16 MED ORDER — DILTIAZEM HCL ER 120 MG PO CP12: 240.0000 mg | ORAL_CAPSULE | Freq: Every day | ORAL | 2 refills | Status: DC

## 2022-01-16 NOTE — Telephone Encounter (Signed)
Refill request for Diltiazem 120 mg capsules approved and sent to [facility]. ?

## 2022-01-16 NOTE — Patient Instructions (Addendum)
Please have labs completed at [facility]. ? ?We will arrange for you to have an upper endoscopy with possible dilation of your esophagus in the near future with Dr. Marletta Lor. ?Morning of your procedure: Do not take any morning diabetes medications. ? ?For celiac disease: ?Continue following a gluten-free diet. ? ?For reflux: ?Continue rabeprazole 20 mg twice daily 30 minutes before breakfast and dinner. ?Continue famotidine daily. ?Follow a GERD diet:  ?Avoid fried, fatty, greasy, spicy, citrus foods. ?Avoid caffeine and carbonated beverages. ?Avoid chocolate. ?Try eating 4-6 small meals a day rather than 3 large meals. ?Do not eat within 3 hours of laying down. ?Prop head of bed up on wood or bricks to create a 6 inch incline. ? ?For constipation: ?Start Trulance 3 mg daily.  We are providing you with samples.  Please call with a progress report in 1 week. ? ?For fatty liver/elevated liver enzymes: ?Instructions for fatty liver: ?Recommend 1-2# weight loss per week until ideal body weight through exercise & diet. ?Low fat/cholesterol diet.   ?Avoid sweets, sodas, fruit juices, sweetened beverages like tea, etc. ?Gradually increase exercise from 15 min daily up to 1 hr per day 5 days/week. ?Avoid alcohol use. ?Avoid OTC supplements and herbal teas  ?Let me know if you would like a referral to [facility]. ? ?We will plan to see you back in the office after your upper endoscopy.  Do not hesitate to call if you have any questions or concerns prior to your next visit. ? ?It was great meeting you today! ? ?Ermalinda Memos, PA-C ?[facility] ? ? ? ? ? ? ?

## 2022-01-28 ENCOUNTER — Other Ambulatory Visit: Payer: Self-pay | Admitting: Nurse Practitioner

## 2022-01-28 ENCOUNTER — Ambulatory Visit: Payer: 59 | Admitting: Nutrition

## 2022-01-30 ENCOUNTER — Encounter: Payer: Self-pay | Admitting: Internal Medicine

## 2022-01-31 ENCOUNTER — Encounter: Payer: Self-pay | Admitting: Internal Medicine

## 2022-01-31 ENCOUNTER — Encounter: Payer: Self-pay | Admitting: *Deleted

## 2022-01-31 ENCOUNTER — Ambulatory Visit (INDEPENDENT_AMBULATORY_CARE_PROVIDER_SITE_OTHER): Payer: 59 | Admitting: Internal Medicine

## 2022-01-31 VITALS — BP 142/92 | HR 87 | Resp 18 | Ht 62.0 in | Wt 197.2 lb

## 2022-01-31 DIAGNOSIS — M1990 Unspecified osteoarthritis, unspecified site: Secondary | ICD-10-CM

## 2022-01-31 DIAGNOSIS — S8992XA Unspecified injury of left lower leg, initial encounter: Secondary | ICD-10-CM

## 2022-01-31 MED ORDER — TRAMADOL HCL 50 MG PO TABS: 50.0000 mg | ORAL_TABLET | Freq: Two times a day (BID) | ORAL | 0 refills | Status: AC | PRN

## 2022-01-31 NOTE — Patient Instructions (Signed)
Please take Tylenol for mild to moderate pain. Please take Tramadol for severe pain.  You are being referred to Orthopedic surgery.

## 2022-01-31 NOTE — Progress Notes (Signed)
Acute Office Visit  Subjective:    Patient ID: Monique Warren, female    DOB: 08/09/1951, 71 y.o.   MRN: [id number]  Chief Complaint  Patient presents with   Knee Pain    Patient having left knee pain has been going on since April 13 23 its a sharp pain when she moves also heel is hurting on left side when she walks this is unbearable     HPI Patient is in today for c/o left knee pain since her fall in the tomato cage around 6 weeks ago.  She had direct impact injury on left knee.  She has constant knee pain, which is worse with flexion and lateral movement, worse with standing and walking and better with rest and Tylenol.  She also complains of left heel pain, which is constant and worse with walking.  She denies any numbness or tingling of the LE.  She denies any external redness or swelling of the knee.  Past Medical History:  Diagnosis Date   Arthritis    Asthma    Celiac disease    Diabetes mellitus, type II ([facility])    Diabetic neuropathy ([facility])    Diastolic dysfunction    Grade 1 with preserved EF   Heart murmur    Hiatal hernia    Sliding   Hyperlipidemia    Hypertension    Migraine    Obstructive sleep apnea    Osteoporosis    Vertigo     Past Surgical History:  Procedure Laterality Date   ABDOMINAL HYSTERECTOMY  09/29/2003   APPENDECTOMY     BREAST BIOPSY Left    CARPAL TUNNEL RELEASE     CATARACT EXTRACTION Bilateral    October and November 2016   COLONOSCOPY  09/2017   Blake Divine, MD in [place]; 5 mm tubular adenoma in the descending colon s/p resected, mild sigmoid diverticulosis, internal hemorrhoids.  Recommended repeat colonoscopy in 5 years.   CRANIOTOMY FOR ANEURYSM / VERTEBROBASILAR / CAROTID CIRCULATION Right 10/24/2015   ESOPHAGOGASTRODUODENOSCOPY  09/2017   [name]; irregular Z-line s/p biopsy, decreased LES tone, 4 cm hiatal hernia, erythema in gastric antrum biopsied, normal examined duodenum biopsied.  Esophageal biopsy suggestive of reflux,  no Barrett's, gastric biopsy with nonspecific chronic gastritis with intestinal metaplasia, duodenal biopsy with increased intraepithelial lymphocytes without blunting of villi, nonspecific.   LAPAROSCOPY     UTERINE FIBROID SURGERY      Family History  Problem Relation Age of Onset   Breast cancer Mother    Thyroid disease Mother    Stroke Mother    Hypertension Father    Hyperlipidemia Father    Heart attack Father    Heart failure Father    Breast cancer Paternal Grandmother    Cancer - Colon Neg Hx    Gastric cancer Neg Hx    Esophageal cancer Neg Hx     Social History   Socioeconomic History   Marital status: Married    Spouse name: Not on file   Number of children: Not on file   Years of education: Not on file   Highest education level: Not on file  Occupational History   Not on file  Tobacco Use   Smoking status: Former    Packs/day: 0.50    Types: Cigarettes   Smokeless tobacco: Never  Vaping Use   Vaping Use: Never used  Substance and Sexual Activity   Alcohol use: Yes    Alcohol/week: 4.0 standard drinks  Types: 2 Glasses of wine, 2 Shots of liquor per week    Comment: on the weekends   Drug use: Never   Sexual activity: Not on file  Other Topics Concern   Not on file  Social History Narrative   Not on file   Social Determinants of Health   Financial Resource Strain: Not on file  Food Insecurity: Not on file  Transportation Needs: Not on file  Physical Activity: Not on file  Stress: Not on file  Social Connections: Not on file  Intimate Partner Violence: Not on file    Outpatient Medications Prior to Visit  Medication Sig Dispense Refill   AIMOVIG 140 MG/ML SOAJ INJECT 140 MG UNDER THE SKIN EVERY 30 DAYS 1 mL 11   albuterol (VENTOLIN HFA) 108 (90 Base) MCG/ACT inhaler Inhale into the lungs.     atorvastatin (LIPITOR) 40 MG tablet Take 40 mg by mouth daily.     buPROPion (WELLBUTRIN XL) 300 MG 24 hr tablet TAKE 1 TABLET DAILY 90 tablet 3    diltiazem (CARDIZEM SR) 120 MG 12 hr capsule Take 2 capsules (240 mg total) by mouth daily. 180 capsule 2   DULoxetine (CYMBALTA) 60 MG capsule Take 60 mg by mouth daily.     Estradiol 10 MCG TABS vaginal tablet Place vaginally.     famotidine (PEPCID) 40 MG tablet TAKE 1 TABLET DAILY 90 tablet 3   fluticasone (FLOVENT HFA) 110 MCG/ACT inhaler Inhale into the lungs 2 (two) times daily.     Lancets (ONETOUCH DELICA PLUS LANCET33G) MISC Use to check glucose once daily 100 each 6   ONETOUCH ULTRA test strip Use to monitor glucose once daily 100 each 6   OZEMPIC, 1 MG/DOSE, 4 MG/3ML SOPN INJECT 1 MG ONCE A WEEK AS DIRECTED 6 mL 1   PROLIA 60 MG/ML SOSY injection Inject 60 mg into the skin every 6 (six) months. 1 mL 1   RABEprazole (ACIPHEX) 20 MG tablet Take 20 mg by mouth 2 (two) times daily.     busPIRone (BUSPAR) 10 MG tablet TAKE 1 TABLET THREE TIMES A DAY 90 tablet 5   polyethylene glycol powder (GLYCOLAX/MIRALAX) 17 GM/SCOOP powder Take 17 g by mouth 2 (two) times daily as needed for moderate constipation. 3350 g 1   Probiotic Product (ENVIVE PO) Take by mouth.     No facility-administered medications prior to visit.    Allergies  Allergen Reactions   Cefuroxime Anaphylaxis, Anxiety, Hives, Shortness Of Breath and Swelling    Throat Throat Throat Throat almost closed off Throat Throat Throat    Codeine Anxiety, Hives, Nausea And Vomiting, Shortness Of Breath and Swelling    Throat Throat throat throat Throat Throat    Sulfamethoxazole-Trimethoprim Anxiety, Shortness Of Breath and Swelling    Throat swelling Throat swelling    Sulfa Antibiotics Swelling    Swollen throat    Review of Systems  Constitutional:  Negative for chills and fever.  HENT:  Negative for congestion, sinus pressure, sinus pain and sore throat.   Eyes:  Negative for pain and discharge.  Respiratory:  Positive for shortness of breath. Negative for cough.   Cardiovascular:  Negative for chest pain  and palpitations.  Gastrointestinal:  Positive for constipation. Negative for abdominal pain, diarrhea, nausea and vomiting.  Endocrine: Negative for polydipsia and polyuria.  Genitourinary:  Negative for dysuria and hematuria.  Musculoskeletal:  Positive for arthralgias (Left knee and heel) and back pain. Negative for neck pain and neck stiffness.  Skin:  Negative for rash.  Neurological:  Positive for headaches. Negative for dizziness and weakness.  Psychiatric/Behavioral:  Negative for agitation and behavioral problems.       Objective:    Physical Exam Vitals reviewed.  Constitutional:      General: She is not in acute distress.    Appearance: She is obese. She is not diaphoretic.  HENT:     Head: Normocephalic and atraumatic.     Nose: Nose normal.     Mouth/Throat:     Mouth: Mucous membranes are moist.  Eyes:     General: No scleral icterus.    Extraocular Movements: Extraocular movements intact.  Cardiovascular:     Rate and Rhythm: Normal rate and regular rhythm.     Pulses: Normal pulses.     Heart sounds: Murmur (Systolic over right upper sternal border) heard.  Pulmonary:     Breath sounds: Normal breath sounds. No wheezing or rales.  Abdominal:     Palpations: Abdomen is soft.     Tenderness: There is no abdominal tenderness.  Musculoskeletal:        General: Tenderness (Left heel) present.     Cervical back: Neck supple. No tenderness.     Right lower leg: No edema.     Left lower leg: No edema.     Comments: ROM limited at left knee due to pain  Skin:    General: Skin is warm.     Findings: No rash.  Neurological:     General: No focal deficit present.     Mental Status: She is alert and oriented to person, place, and time.  Psychiatric:        Mood and Affect: Mood normal.        Behavior: Behavior normal.    BP (!) 142/92 (BP Location: Left Arm, Cuff Size: Normal)   Pulse 87   Resp 18   Ht 5\' 2"  (1.575 m)   Wt 197 lb 3.2 oz (89.4 kg)   SpO2 97%    BMI 36.07 kg/m  Wt Readings from Last 3 Encounters:  01/31/22 197 lb 3.2 oz (89.4 kg)  01/16/22 202 lb (91.6 kg)  01/07/22 200 lb (90.7 kg)        Assessment & Plan:   Problem List Items Addressed This Visit       Musculoskeletal and Integument   Arthritis She also likely has underlying OA of knee and ankle Tylenol as needed for mild-to-moderate pain   Relevant Medications   traMADol (ULTRAM) 50 MG tablet   Other Relevant Orders   Ambulatory referral to Orthopedic Surgery   Other Visit Diagnoses     Knee injury, left, initial encounter - Primary       Left knee injury from a mechanical fall Tylenol as needed for mild-to-moderate pain Tramadol as needed for severe pain Unable to take oral NSAIDs as she has GERD and celiac disease Referred to orthopedic surgery for evaluation for possible ligament tear        Relevant Medications   traMADol (ULTRAM) 50 MG tablet   Other Relevant Orders   Ambulatory referral to Orthopedic Surgery        Meds ordered this encounter  Medications   traMADol (ULTRAM) 50 MG tablet    Sig: Take 1 tablet (50 mg total) by mouth every 12 (twelve) hours as needed for up to 10 days.    Dispense:  20 tablet    Refill:  0     Monique Warren  Keith Rake, MD

## 2022-02-03 ENCOUNTER — Encounter: Payer: Self-pay | Admitting: Internal Medicine

## 2022-02-04 ENCOUNTER — Other Ambulatory Visit: Payer: Self-pay | Admitting: Internal Medicine

## 2022-02-04 DIAGNOSIS — E1140 Type 2 diabetes mellitus with diabetic neuropathy, unspecified: Secondary | ICD-10-CM

## 2022-02-04 MED ORDER — DULOXETINE HCL 60 MG PO CPEP: 60.0000 mg | ORAL_CAPSULE | Freq: Every day | ORAL | 1 refills | Status: DC

## 2022-02-05 ENCOUNTER — Encounter: Payer: Self-pay | Admitting: Internal Medicine

## 2022-02-06 NOTE — Telephone Encounter (Signed)
Reviewed MRI with pt (it did not route back to me)    LV is vigorous    with SAM of Mitral leaflet   Did not look like HCM Mild to moderate MR    WIll follow over time   Would reocmm :   Toprol XL   25 mg     Take for a couple weeks then increase to 50 mg    (1 bid) Follow up with me this summer   Not sure where I saw her

## 2022-02-07 ENCOUNTER — Encounter: Payer: Self-pay | Admitting: Orthopedic Surgery

## 2022-02-07 ENCOUNTER — Ambulatory Visit (INDEPENDENT_AMBULATORY_CARE_PROVIDER_SITE_OTHER): Payer: 59 | Admitting: Orthopedic Surgery

## 2022-02-07 ENCOUNTER — Ambulatory Visit (INDEPENDENT_AMBULATORY_CARE_PROVIDER_SITE_OTHER): Payer: 59

## 2022-02-07 VITALS — BP 150/73 | HR 97 | Ht 62.0 in | Wt 197.0 lb

## 2022-02-07 DIAGNOSIS — M25562 Pain in left knee: Secondary | ICD-10-CM

## 2022-02-07 DIAGNOSIS — M1712 Unilateral primary osteoarthritis, left knee: Secondary | ICD-10-CM

## 2022-02-07 DIAGNOSIS — S83242A Other tear of medial meniscus, current injury, left knee, initial encounter: Secondary | ICD-10-CM

## 2022-02-07 DIAGNOSIS — G8929 Other chronic pain: Secondary | ICD-10-CM

## 2022-02-07 NOTE — Progress Notes (Signed)
NEW PROBLEM//OFFICE VISIT   Chief Complaint  Patient presents with   Knee Pain    Left 6 weeks ago fell in yard    Foot Pain    Left heel also painful    This is a 71 year old female who was injured about 6 weeks ago when her left foot was caught in the cage for the tomatoes causing her to twist and fall injuring her left knee.  She has celiac disease cannot take anti-inflammatories but was given Tylenol and tramadol for pain comes in with worsening left knee pain decreased range of motion swelling medial sided knee pain    ROS: celiac disease  Review of Systems  Cardiovascular:  Positive for palpitations.    BP (!) 150/73   Pulse 97   Ht 5\' 2"  (1.575 m)   Wt 197 lb (89.4 kg)   BMI 36.03 kg/m   Body mass index is 36.03 kg/m.  General appearance: Well-developed well-nourished no gross deformities  Cardiovascular normal pulse and perfusion normal color without edema  Neurologically no sensation loss or deficits or pathologic reflexes  Psychological: Awake alert and oriented x3 mood and affect normal  Skin no lacerations or ulcerations no nodularity no palpable masses, no erythema or nodularity  Musculoskeletal: She has a small effusion left knee she can only bend the knee to 90 degrees she can extend it normally she has medial joint line tenderness and positive McMurray's with stable anterior posterior drawer testing      Past Medical History:  Diagnosis Date   Arthritis    Asthma    Celiac disease    Diabetes mellitus, type II (HCC)    Diabetic neuropathy (HCC)    Diastolic dysfunction    Grade 1 with preserved EF   Heart murmur    Hiatal hernia    Sliding   Hyperlipidemia    Hypertension    Migraine    Obstructive sleep apnea    Osteoporosis    Vertigo     Past Surgical History:  Procedure Laterality Date   ABDOMINAL HYSTERECTOMY  09/29/2003   APPENDECTOMY     BREAST BIOPSY Left    CARPAL TUNNEL RELEASE     CATARACT EXTRACTION Bilateral     October and November 2016   COLONOSCOPY  09/2017   10/2017, MD in [place]; 5 mm tubular adenoma in the descending colon s/p resected, mild sigmoid diverticulosis, internal hemorrhoids.  Recommended repeat colonoscopy in 5 years.   CRANIOTOMY FOR ANEURYSM / VERTEBROBASILAR / CAROTID CIRCULATION Right 10/24/2015   ESOPHAGOGASTRODUODENOSCOPY  09/2017   [name]; irregular Z-line s/p biopsy, decreased LES tone, 4 cm hiatal hernia, erythema in gastric antrum biopsied, normal examined duodenum biopsied.  Esophageal biopsy suggestive of reflux, no Barrett's, gastric biopsy with nonspecific chronic gastritis with intestinal metaplasia, duodenal biopsy with increased intraepithelial lymphocytes without blunting of villi, nonspecific.   LAPAROSCOPY     UTERINE FIBROID SURGERY      Family History  Problem Relation Age of Onset   Breast cancer Mother    Thyroid disease Mother    Stroke Mother    Hypertension Father    Hyperlipidemia Father    Heart attack Father    Heart failure Father    Breast cancer Paternal Grandmother    Cancer - Colon Neg Hx    Gastric cancer Neg Hx    Esophageal cancer Neg Hx    Social History   Tobacco Use   Smoking status: Former    Packs/day: 0.50  Types: Cigarettes   Smokeless tobacco: Never  Vaping Use   Vaping Use: Never used  Substance Use Topics   Alcohol use: Yes    Alcohol/week: 4.0 standard drinks    Types: 2 Glasses of wine, 2 Shots of liquor per week    Comment: on the weekends   Drug use: Never    Allergies  Allergen Reactions   Cefuroxime Anaphylaxis, Hives, Shortness Of Breath, Swelling and Anxiety   Codeine Hives, Shortness Of Breath, Nausea And Vomiting, Swelling and Anxiety   Sulfa Antibiotics Anaphylaxis, Hives, Nausea And Vomiting and Swelling    Swollen throat   Gluten Meal     Severe reflux - celiac disease    Current Meds  Medication Sig   AIMOVIG 140 MG/ML SOAJ INJECT 140 MG UNDER THE SKIN EVERY 30 DAYS   albuterol  (VENTOLIN HFA) 108 (90 Base) MCG/ACT inhaler Inhale 2 puffs into the lungs every 6 (six) hours as needed for wheezing or shortness of breath.   atorvastatin (LIPITOR) 40 MG tablet Take 40 mg by mouth daily.   buPROPion (WELLBUTRIN XL) 300 MG 24 hr tablet TAKE 1 TABLET DAILY   diltiazem (CARDIZEM SR) 120 MG 12 hr capsule Take 2 capsules (240 mg total) by mouth daily.   DULoxetine (CYMBALTA) 60 MG capsule Take 1 capsule (60 mg total) by mouth daily. (Patient taking differently: Take 60 mg by mouth at bedtime.)   Estradiol 10 MCG TABS vaginal tablet Place 10 mcg vaginally every Monday.   famotidine (PEPCID) 40 MG tablet TAKE 1 TABLET DAILY   fluticasone (FLOVENT HFA) 110 MCG/ACT inhaler Inhale 2 puffs into the lungs 2 (two) times daily.   Lancets (ONETOUCH DELICA PLUS LANCET33G) MISC Use to check glucose once daily   ONETOUCH ULTRA test strip Use to monitor glucose once daily   OZEMPIC, 1 MG/DOSE, 4 MG/3ML SOPN INJECT 1 MG ONCE A WEEK AS DIRECTED   PROLIA 60 MG/ML SOSY injection Inject 60 mg into the skin every 6 (six) months.   RABEprazole (ACIPHEX) 20 MG tablet Take 20 mg by mouth 2 (two) times daily.   traMADol (ULTRAM) 50 MG tablet Take 1 tablet (50 mg total) by mouth every 12 (twelve) hours as needed for up to 10 days.   zolmitriptan (ZOMIG-ZMT) 5 MG disintegrating tablet Take 5 mg by mouth as needed for migraine.     MEDICAL DECISION MAKING  A.  Encounter Diagnosis  Name Primary?   Chronic pain of left knee Yes    B. DATA ANALYSED:   IMAGING: Interpretation of images: I have personally reviewed the images and my interpretation is internal images show grade 1 arthritis of the left knee with small joint effusion   Orders: MRI left knee  Outside records reviewed: None   C. MANAGEMENT   Recommend MRI to evaluate the medial meniscus for possible tear.  Patient does have a history of cardiac disease will require preop clearance if surgery needed.    No orders of the defined  types were placed in this encounter.    Fuller Canada, MD  02/07/2022 2:16 PM

## 2022-02-07 NOTE — Patient Instructions (Signed)
While we are working on your approval for MRI please go ahead and call to schedule your appointment with [facility] within at least one (1) week.   Central Scheduling [phone]  

## 2022-02-08 ENCOUNTER — Encounter (HOSPITAL_COMMUNITY)
Admission: RE | Disposition: A | Discharge status: Home / Self Care | Payer: Self-pay | Source: Home / Self Care | Attending: Internal Medicine

## 2022-02-08 ENCOUNTER — Other Ambulatory Visit: Payer: Self-pay

## 2022-02-08 ENCOUNTER — Encounter (HOSPITAL_COMMUNITY): Payer: Self-pay

## 2022-02-08 ENCOUNTER — Ambulatory Visit (HOSPITAL_BASED_OUTPATIENT_CLINIC_OR_DEPARTMENT_OTHER): Payer: 59 | Admitting: Anesthesiology

## 2022-02-08 ENCOUNTER — Ambulatory Visit (HOSPITAL_COMMUNITY)
Admission: RE | Admit: 2022-02-08 | Discharge: 2022-02-08 | Disposition: A | Discharge status: Home / Self Care | Payer: 59 | Attending: Internal Medicine | Admitting: Internal Medicine

## 2022-02-08 ENCOUNTER — Ambulatory Visit (HOSPITAL_COMMUNITY): Payer: 59 | Admitting: Anesthesiology

## 2022-02-08 DIAGNOSIS — F419 Anxiety disorder, unspecified: Secondary | ICD-10-CM | POA: Insufficient documentation

## 2022-02-08 DIAGNOSIS — J45909 Unspecified asthma, uncomplicated: Secondary | ICD-10-CM | POA: Diagnosis not present

## 2022-02-08 DIAGNOSIS — J449 Chronic obstructive pulmonary disease, unspecified: Secondary | ICD-10-CM | POA: Insufficient documentation

## 2022-02-08 DIAGNOSIS — R1310 Dysphagia, unspecified: Secondary | ICD-10-CM

## 2022-02-08 DIAGNOSIS — K5909 Other constipation: Secondary | ICD-10-CM | POA: Diagnosis not present

## 2022-02-08 DIAGNOSIS — E785 Hyperlipidemia, unspecified: Secondary | ICD-10-CM | POA: Diagnosis not present

## 2022-02-08 DIAGNOSIS — Z87891 Personal history of nicotine dependence: Secondary | ICD-10-CM

## 2022-02-08 DIAGNOSIS — Z6836 Body mass index (BMI) 36.0-36.9, adult: Secondary | ICD-10-CM | POA: Insufficient documentation

## 2022-02-08 DIAGNOSIS — G4733 Obstructive sleep apnea (adult) (pediatric): Secondary | ICD-10-CM | POA: Insufficient documentation

## 2022-02-08 DIAGNOSIS — I10 Essential (primary) hypertension: Secondary | ICD-10-CM | POA: Diagnosis not present

## 2022-02-08 DIAGNOSIS — R6881 Early satiety: Secondary | ICD-10-CM | POA: Diagnosis not present

## 2022-02-08 DIAGNOSIS — K219 Gastro-esophageal reflux disease without esophagitis: Secondary | ICD-10-CM | POA: Diagnosis not present

## 2022-02-08 DIAGNOSIS — K2270 Barrett's esophagus without dysplasia: Secondary | ICD-10-CM

## 2022-02-08 DIAGNOSIS — E119 Type 2 diabetes mellitus without complications: Secondary | ICD-10-CM | POA: Diagnosis not present

## 2022-02-08 DIAGNOSIS — K2970 Gastritis, unspecified, without bleeding: Secondary | ICD-10-CM | POA: Diagnosis not present

## 2022-02-08 DIAGNOSIS — Z86010 Personal history of colonic polyps: Secondary | ICD-10-CM | POA: Diagnosis not present

## 2022-02-08 DIAGNOSIS — K222 Esophageal obstruction: Secondary | ICD-10-CM | POA: Diagnosis not present

## 2022-02-08 DIAGNOSIS — F32A Depression, unspecified: Secondary | ICD-10-CM | POA: Diagnosis not present

## 2022-02-08 DIAGNOSIS — K449 Diaphragmatic hernia without obstruction or gangrene: Secondary | ICD-10-CM

## 2022-02-08 DIAGNOSIS — Z79899 Other long term (current) drug therapy: Secondary | ICD-10-CM | POA: Diagnosis not present

## 2022-02-08 DIAGNOSIS — Z7985 Long-term (current) use of injectable non-insulin antidiabetic drugs: Secondary | ICD-10-CM | POA: Diagnosis not present

## 2022-02-08 DIAGNOSIS — K760 Fatty (change of) liver, not elsewhere classified: Secondary | ICD-10-CM | POA: Insufficient documentation

## 2022-02-08 DIAGNOSIS — Z7984 Long term (current) use of oral hypoglycemic drugs: Secondary | ICD-10-CM | POA: Insufficient documentation

## 2022-02-08 HISTORY — PX: BALLOON DILATION: SHX5330

## 2022-02-08 HISTORY — PX: BIOPSY: SHX5522

## 2022-02-08 HISTORY — PX: ESOPHAGOGASTRODUODENOSCOPY (EGD) WITH PROPOFOL: SHX5813

## 2022-02-08 LAB — HEPATIC FUNCTION PANEL
AG Ratio: 1.9 (calc) (ref 1.0–2.5)
ALT: 64 U/L — ABNORMAL HIGH (ref 6–29)
AST: 50 U/L — ABNORMAL HIGH (ref 10–35)
Albumin: 4.3 g/dL (ref 3.6–5.1)
Alkaline phosphatase (APISO): 108 U/L (ref 37–153)
Bilirubin, Direct: 0.1 mg/dL (ref 0.0–0.2)
Globulin: 2.3 g/dL (calc) (ref 1.9–3.7)
Indirect Bilirubin: 0.5 mg/dL (calc) (ref 0.2–1.2)
Total Bilirubin: 0.6 mg/dL (ref 0.2–1.2)
Total Protein: 6.6 g/dL (ref 6.1–8.1)

## 2022-02-08 LAB — HEPATITIS A ANTIBODY, TOTAL: Hepatitis A AB,Total: REACTIVE — AB

## 2022-02-08 LAB — HEPATITIS B SURFACE ANTIGEN: Hepatitis B Surface Ag: NONREACTIVE

## 2022-02-08 LAB — GLUCOSE, CAPILLARY: Glucose-Capillary: 160 mg/dL — ABNORMAL HIGH (ref 70–99)

## 2022-02-08 LAB — HEMOCHROMATOSIS DNA-PCR(C282Y,H63D)

## 2022-02-08 LAB — FERRITIN: Ferritin: 98 ng/mL (ref 16–288)

## 2022-02-08 LAB — HEPATITIS B CORE ANTIBODY, TOTAL: Hep B Core Total Ab: NONREACTIVE

## 2022-02-08 LAB — HEPATITIS B SURFACE ANTIBODY,QUALITATIVE: Hep B S Ab: NONREACTIVE

## 2022-02-08 SURGERY — ESOPHAGOGASTRODUODENOSCOPY (EGD) WITH PROPOFOL
Anesthesia: General

## 2022-02-08 MED ORDER — LACTATED RINGERS IV SOLN: INTRAVENOUS | Status: DC

## 2022-02-08 MED ORDER — PROPOFOL 10 MG/ML IV BOLUS
INTRAVENOUS | Status: DC | PRN
Start: 2022-02-08 — End: 2022-02-08
  Administered 2022-02-08: 100 mg via INTRAVENOUS
  Administered 2022-02-08: 70 mg via INTRAVENOUS

## 2022-02-08 MED ORDER — LIDOCAINE HCL (CARDIAC) PF 100 MG/5ML IV SOSY
PREFILLED_SYRINGE | INTRAVENOUS | Status: DC | PRN
  Administered 2022-02-08: 50 mg via INTRAVENOUS

## 2022-02-08 NOTE — Anesthesia Procedure Notes (Signed)
Date/Time: 02/08/2022 11:07 AM Performed by: Orlie Dakin, CRNA Pre-anesthesia Checklist: Patient identified, Emergency Drugs available, Suction available and Patient being monitored Patient Re-evaluated:Patient Re-evaluated prior to induction Oxygen Delivery Method: Nasal cannula Induction Type: IV induction Placement Confirmation: positive ETCO2

## 2022-02-08 NOTE — Transfer of Care (Signed)
Immediate Anesthesia Transfer of Care Note  Patient: Monique Warren  Procedure(s) Performed: ESOPHAGOGASTRODUODENOSCOPY (EGD) WITH PROPOFOL BALLOON DILATION BIOPSY  Patient Location: Endoscopy Unit  Anesthesia Type:General  Level of Consciousness: drowsy  Airway & Oxygen Therapy: Patient Spontanous Breathing  Post-op Assessment: Report given to RN and Post -op Vital signs reviewed and stable  Post vital signs: Reviewed and stable  Last Vitals:  Vitals Value Taken Time  BP    Temp    Pulse    Resp    SpO2      Last Pain:  Vitals:   02/08/22 1058  TempSrc:   PainSc: 3       Patients Stated Pain Goal: 7 ([id number])  Complications: No notable events documented.

## 2022-02-08 NOTE — Anesthesia Preprocedure Evaluation (Addendum)
Anesthesia Evaluation  Patient identified by MRN, date of birth, ID band Patient awake    Reviewed: Allergy & Precautions, NPO status , Patient's Chart, lab work & pertinent test results  Airway Mallampati: II  TM Distance: >3 FB Neck ROM: Full    Dental  (+) Dental Advisory Given, Missing, Chipped,    Pulmonary asthma , sleep apnea and Continuous Positive Airway Pressure Ventilation , neg COPD,  COPD inhaler, former smoker,    Pulmonary exam normal breath sounds clear to auscultation       Cardiovascular Exercise Tolerance: Good hypertension, Pt. on medications + Valvular Problems/Murmurs  Rhythm:Regular Rate:Normal + Systolic murmurs    Neuro/Psych  Headaches, PSYCHIATRIC DISORDERS Anxiety Depression CRANIOTOMY FOR ANEURYSM / VERTEBROBASILAR / CAROTID CIRCULATION - 10/24/15  Neuromuscular disease    GI/Hepatic Neg liver ROS, hiatal hernia, GERD  Medicated and Controlled,  Endo/Other  diabetes, Well Controlled, Type 2, Oral Hypoglycemic Agents  Renal/GU negative Renal ROS  negative genitourinary   Musculoskeletal  (+) Arthritis , Osteoarthritis,    Abdominal   Peds negative pediatric ROS (+)  Hematology negative hematology ROS (+)   Anesthesia Other Findings Loss of balance  Reproductive/Obstetrics negative OB ROS                            Anesthesia Physical Anesthesia Plan  ASA: 3  Anesthesia Plan: General   Post-op Pain Management: Minimal or no pain anticipated   Induction: Intravenous  PONV Risk Score and Plan: Propofol infusion  Airway Management Planned: Nasal Cannula and Natural Airway  Additional Equipment:   Intra-op Plan:   Post-operative Plan:   Informed Consent: I have reviewed the patients History and Physical, chart, labs and discussed the procedure including the risks, benefits and alternatives for the proposed anesthesia with the patient or authorized  representative who has indicated his/her understanding and acceptance.     Dental advisory given  Plan Discussed with: CRNA and Surgeon  Anesthesia Plan Comments:         Anesthesia Quick Evaluation

## 2022-02-08 NOTE — Anesthesia Postprocedure Evaluation (Signed)
Anesthesia Post Note  Patient: Monique Warren  Procedure(s) Performed: ESOPHAGOGASTRODUODENOSCOPY (EGD) WITH PROPOFOL BALLOON DILATION BIOPSY  Patient location during evaluation: Phase II Anesthesia Type: General Level of consciousness: awake and alert and oriented Pain management: pain level controlled Vital Signs Assessment: post-procedure vital signs reviewed and stable Respiratory status: spontaneous breathing, nonlabored ventilation and respiratory function stable Cardiovascular status: blood pressure returned to baseline and stable Postop Assessment: no apparent nausea or vomiting Anesthetic complications: no   No notable events documented.   Last Vitals:  Vitals:   02/08/22 1040 02/08/22 1115  BP: (!) 152/78 (!) 116/47  Pulse: 93   Resp: 15 19  Temp: 36.5 C 36.5 C  SpO2: 95% 95%    Last Pain:  Vitals:   02/08/22 1115  TempSrc: Oral  PainSc: 0-No pain                 Christhoper Busbee C Tyreik Delahoussaye

## 2022-02-08 NOTE — Discharge Instructions (Addendum)
EGD Discharge instructions Please read the instructions outlined below and refer to this sheet in the next few weeks. These discharge instructions provide you with general information on caring for yourself after you leave the hospital. Your doctor may also give you specific instructions. While your treatment has been planned according to the most current medical practices available, unavoidable complications occasionally occur. If you have any problems or questions after discharge, please call your doctor. ACTIVITY You may resume your regular activity but move at a slower pace for the next 24 hours.  Take frequent rest periods for the next 24 hours.  Walking will help expel (get rid of) the air and reduce the bloated feeling in your abdomen.  No driving for 24 hours (because of the anesthesia (medicine) used during the test).  You may shower.  Do not sign any important legal documents or operate any machinery for 24 hours (because of the anesthesia used during the test).  NUTRITION Drink plenty of fluids.  You may resume your normal diet.  Begin with a light meal and progress to your normal diet.  Avoid alcoholic beverages for 24 hours or as instructed by your caregiver.  MEDICATIONS You may resume your normal medications unless your caregiver tells you otherwise.  WHAT YOU CAN EXPECT TODAY You may experience abdominal discomfort such as a feeling of fullness or "gas" pains.  FOLLOW-UP Your doctor will discuss the results of your test with you.  SEEK IMMEDIATE MEDICAL ATTENTION IF ANY OF THE FOLLOWING OCCUR: Excessive nausea (feeling sick to your stomach) and/or vomiting.  Severe abdominal pain and distention (swelling).  Trouble swallowing.  Temperature over 101 F (37.8 C).  Rectal bleeding or vomiting of blood.    Your EGD revealed mild amount inflammation in your stomach.  I took biopsies of this to rule out infection with a bacteria called H. pylori.  Await pathology results, my  office will contact you.  You did have a slight tightening of your esophagus so I stretched it today.  Hopefully this helps with your swallowing.  You do have evidence of short segment Barrett's esophagus as well as a small hiatal hernia.  I took biopsies of this as well.  Likely repeat EGD in 3 to 5 years.  Follow-up with GI in 3 to 4 months.   I hope you have a great rest of your week!  Hennie Duos. Marletta Lor, D.O. Gastroenterology and Hepatology [facility]

## 2022-02-08 NOTE — Interval H&P Note (Signed)
History and Physical Interval Note:  02/08/2022 10:50 AM  Monique Warren  has presented today for surgery, with the diagnosis of dysphagia, history of Barrett's esophagus, celiac disease, GERD, early satiety.  The various methods of treatment have been discussed with the patient and family. After consideration of risks, benefits and other options for treatment, the patient has consented to  Procedure(s) with comments: ESOPHAGOGASTRODUODENOSCOPY (EGD) WITH PROPOFOL (N/A) - 12:00pm BALLOON DILATION (N/A) as a surgical intervention.  The patient's history has been reviewed, patient examined, no change in status, stable for surgery.  I have reviewed the patient's chart and labs.  Questions were answered to the patient's satisfaction.     Eloise Harman

## 2022-02-08 NOTE — Op Note (Signed)
[facility] Patient Name: Monique Warren Procedure Date: 02/08/2022 10:50 AM MRN: [id number] Date of Birth: 07/15/1951 Attending MD: Elon Alas. Abbey Chatters DO CSN: [id number] Age: 71 Admit Type: Outpatient Procedure:                Upper GI endoscopy Indications:              Dysphagia, Heartburn, Follow-up of Barrett's                            esophagus Providers:                Elon Alas. Abbey Chatters, DO, Lambert Mody, Dereck Leep, Technician Referring MD:              Medicines:                See the Anesthesia note for documentation of the                            administered medications Complications:            No immediate complications. Estimated Blood Loss:     Estimated blood loss was minimal. Procedure:                Pre-Anesthesia Assessment:                           - The anesthesia plan was to use monitored                            anesthesia care (MAC).                           After obtaining informed consent, the endoscope was                            passed under direct vision. Throughout the                            procedure, the patient's blood pressure, pulse, and                            oxygen saturations were monitored continuously. The                            GIF-H190 ([id number]) scope was introduced through the                            mouth, and advanced to the second part of duodenum.                            The upper GI endoscopy was accomplished without                            difficulty. The patient tolerated the procedure  well. Scope In: 11:06:03 AM Scope Out: 11:11:26 AM Total Procedure Duration: 0 hours 5 minutes 23 seconds  Findings:      A 2 cm hiatal hernia was present.      There were esophageal mucosal changes secondary to established       short-segment Barrett's disease present at the gastroesophageal       junction. The maximum longitudinal  extent of these mucosal changes was 2       cm in length. Biopsies were taken with a cold forceps for histology.      A mild Schatzki ring was found in the lower third of the esophagus. A       TTS dilator was passed through the scope. Dilation with an 18-19-20 mm       balloon dilator was performed to 20 mm. The dilation site was examined       and showed moderate improvement in luminal narrowing.      Localized mild inflammation characterized by erythema and linear       erosions was found in the gastric antrum. Biopsies were taken with a       cold forceps for Helicobacter pylori testing.      The duodenal bulb, first portion of the duodenum and second portion of       the duodenum were normal. Impression:               - 2 cm hiatal hernia.                           - Esophageal mucosal changes secondary to                            established short-segment Barrett's disease.                            Biopsied.                           - Mild Schatzki ring. Dilated.                           - Gastritis. Biopsied.                           - Normal duodenal bulb, first portion of the                            duodenum and second portion of the duodenum. Moderate Sedation:      Per Anesthesia Care Recommendation:           - Patient has a contact number available for                            emergencies. The signs and symptoms of potential                            delayed complications were discussed with the                            patient. Return to normal activities tomorrow.  Written discharge instructions were provided to the                            patient.                           - Resume previous diet.                           - Continue present medications.                           - Await pathology results.                           - Repeat upper endoscopy in 5 years for                            surveillance.                            - Return to GI clinic in 4 months.                           - Use a proton pump inhibitor PO daily. Procedure Code(s):        --- Professional ---                           [phone], Esophagogastroduodenoscopy, flexible,                            transoral; with transendoscopic balloon dilation of                            esophagus (less than 30 mm diameter)                           43239, 59, Esophagogastroduodenoscopy, flexible,                            transoral; with biopsy, single or multiple Diagnosis Code(s):        --- Professional ---                           K44.9, Diaphragmatic hernia without obstruction or                            gangrene                           K22.70, Barrett's esophagus without dysplasia                           K22.2, Esophageal obstruction                           K29.70, Gastritis, unspecified, without bleeding  R13.10, Dysphagia, unspecified                           R12, Heartburn CPT copyright 2019 American Medical Association. All rights reserved. The codes documented in this report are preliminary and upon coder review may  be revised to meet current compliance requirements. Elon Alas. Abbey Chatters, DO [place] Abbey Chatters, DO 02/08/2022 11:14:59 AM This report has been signed electronically. Number of Addenda: 0

## 2022-02-12 LAB — SURGICAL PATHOLOGY

## 2022-02-12 MED ORDER — METOPROLOL SUCCINATE ER 50 MG PO TB24: ORAL_TABLET | ORAL | 3 refills | Status: DC

## 2022-02-13 ENCOUNTER — Encounter: Payer: Self-pay | Admitting: *Deleted

## 2022-02-14 ENCOUNTER — Encounter (HOSPITAL_COMMUNITY): Payer: Self-pay | Admitting: Internal Medicine

## 2022-02-22 ENCOUNTER — Ambulatory Visit (HOSPITAL_COMMUNITY)
Admission: RE | Admit: 2022-02-22 | Discharge: 2022-02-22 | Disposition: A | Discharge status: Home / Self Care | Payer: 59 | Source: Ambulatory Visit | Attending: Orthopedic Surgery | Admitting: Orthopedic Surgery

## 2022-02-22 DIAGNOSIS — M25562 Pain in left knee: Secondary | ICD-10-CM

## 2022-02-22 DIAGNOSIS — S83242A Other tear of medial meniscus, current injury, left knee, initial encounter: Secondary | ICD-10-CM

## 2022-02-25 ENCOUNTER — Encounter: Payer: Self-pay | Admitting: Orthopedic Surgery

## 2022-02-25 ENCOUNTER — Ambulatory Visit (INDEPENDENT_AMBULATORY_CARE_PROVIDER_SITE_OTHER): Payer: 59 | Admitting: Orthopedic Surgery

## 2022-02-25 DIAGNOSIS — S83242D Other tear of medial meniscus, current injury, left knee, subsequent encounter: Secondary | ICD-10-CM | POA: Diagnosis not present

## 2022-02-25 NOTE — Patient Instructions (Addendum)
Your surgery will be at [facility] by Dr Harrison  The hospital will contact you with a preoperative appointment to discuss Anesthesia.  The phone number is [phone]  Please bring your medications with you for the appointment. They will tell you the arrival time and medication instructions when you have your preoperative evaluation. Do not wear nail polish the day of your surgery and if you take Phentermine you need to stop this medication ONE WEEK prior to your surgery.   Meniscus Injury, Arthroscopy   Arthroscopy is a surgical procedure that involves the use of a small scope that has a camera and surgical instruments on the end (arthroscope). An arthroscope can be used to repair your meniscus injury.  LET YOUR HEALTH CARE PROVIDER KNOW ABOUT: Any allergies you have. All medicines you are taking, including vitamins, herbs, eyedrops, creams, and over-the-counter medicines. Any recent colds or infections you have had or currently have. Previous problems you or members of your family have had with the use of anesthetics. Any blood disorders or blood clotting problems you have. Previous surgeries you have had. Medical conditions you have. RISKS AND COMPLICATIONS Generally, this is a safe procedure. However, as with any procedure, problems can occur. Possible problems include: Damage to nerves or blood vessels. Excess bleeding. Blood clots. Infection. BEFORE THE PROCEDURE Do not eat or drink for 6-8 hours before the procedure. Take medicines as directed by your surgeon. Ask your surgeon about changing or stopping your regular medicines. You may have lab tests the morning of surgery. PROCEDURE  You will be given one of the following:  A medicine that numbs the area (local anesthesia). A medicine that makes you go to sleep (general anesthesia). A medicine injected into your spine that numbs your body below the waist (spinal anesthesia). Most often, several small cuts (incisions) are  made in the knee. The arthroscope and instruments go into the incisions to repair the damage. The torn portion of the meniscus is removed.   AFTER THE PROCEDURE You will be taken to the recovery area where your progress will be monitored. When you are awake, stable, and taking fluids without complications, you will be allowed to go home. This is usually the same day. A torn or stretched ligament (ligament sprain) may take 6-8 weeks to heal.  It takes about the 4-6 WEEKS if your surgeon removed a torn meniscus. A repaired meniscus may require 6-12 weeks of recovery time. A torn ligament needing reconstructive surgery may take 6-12 months to heal fully.   This information is not intended to replace advice given to you by your health care provider. Make sure you discuss any questions you have with your health care provider. You have decided to proceed with operative arthroscopy of the knee. You have decided not to continue with nonoperative measures such as but not limited to oral medication, weight loss, activity modification, physical therapy, bracing, or injection.  We will perform operative arthroscopy of the knee. Some of the risks associated with arthroscopic surgery of the knee include but are not limited to Bleeding Infection Swelling Stiffness Blood clot Pain Need for knee replacement surgery    In compliance with recent [place] law in federal regulation regarding opioid use and abuse and addiction, we will taper (stop) opioid medication after 2 weeks.  If you're not comfortable with these risks and would like to continue with nonoperative treatment please let Dr. Harrison know prior to your surgery.  

## 2022-02-25 NOTE — Progress Notes (Signed)
FOLLOW UP   Encounter Diagnosis  Name Primary?   Acute medial meniscus tear of left knee, subsequent encounter Yes     Chief Complaint  Patient presents with   Results    MRI Left Knee   Monique Warren continues to have trouble with her left knee.  She says is not any better.  She has acute pain in the left knee after injuring it in the garden  I saw her MRI.  My interpretation is that she has osteoarthritis of the left knee with a torn medial meniscus  Left shoulder the images and the tear and the video of an arthroscopic procedure would recommend she have arthroscopy of the left knee if her symptoms were intolerable or causing her significant change in lifestyle  Encounter Diagnosis  Name Primary?   Acute medial meniscus tear of left knee, subsequent encounter Yes    She agreed that they were and agreed to have arthroscopy of the left knee with partial medial meniscectomy

## 2022-03-12 ENCOUNTER — Other Ambulatory Visit: Payer: Self-pay | Admitting: *Deleted

## 2022-03-12 ENCOUNTER — Encounter: Payer: Self-pay | Admitting: Internal Medicine

## 2022-03-12 MED ORDER — ALBUTEROL SULFATE HFA 108 (90 BASE) MCG/ACT IN AERS: 2.0000 | INHALATION_SPRAY | Freq: Four times a day (QID) | RESPIRATORY_TRACT | 0 refills | Status: DC | PRN

## 2022-03-25 ENCOUNTER — Encounter: Payer: Self-pay | Admitting: Internal Medicine

## 2022-03-26 ENCOUNTER — Encounter: Payer: Self-pay | Admitting: Internal Medicine

## 2022-03-26 ENCOUNTER — Ambulatory Visit: Payer: 59 | Admitting: Internal Medicine

## 2022-03-26 DIAGNOSIS — J0110 Acute frontal sinusitis, unspecified: Secondary | ICD-10-CM | POA: Diagnosis not present

## 2022-03-26 LAB — POCT RAPID STREP A (OFFICE): Rapid Strep A Screen: NEGATIVE

## 2022-03-26 MED ORDER — AZITHROMYCIN 250 MG PO TABS: ORAL_TABLET | ORAL | 0 refills | Status: DC

## 2022-03-26 NOTE — Progress Notes (Signed)
Virtual Visit via Telephone Note   This visit type was conducted due to national recommendations for restrictions regarding the COVID-19 Pandemic (e.g. social distancing) in an effort to limit this patient's exposure and mitigate transmission in our community.  Due to her co-morbid illnesses, this patient is at least at moderate risk for complications without adequate follow up.  This format is felt to be most appropriate for this patient at this time.  The patient did not have access to video technology/had technical difficulties with video requiring transitioning to audio format only (telephone).  All issues noted in this document were discussed and addressed.  No physical exam could be performed with this format.  Evaluation Performed:  Follow-up visit  Date:  03/26/2022   ID:  Monique Warren, DOB 08/08/1951, MRN [id number]  Patient Location: Home Provider Location: Office/Clinic  Participants: Patient Location of Patient: Home Location of Provider: Telehealth Consent was obtain for visit to be over via telehealth. I verified that I am speaking with the correct person using two identifiers.  PCP:  Anabel Halon, MD   Chief Complaint: Fever and nasal congestion  History of Present Illness:    Monique Warren is a 71 y.o. female who has a televisit for complaint of fever, nasal congestion, sinus pressure related headache and pain behind her eyes.  She also complains of sore throat and cough.  She has history of asthma and has been using albuterol inhaler as needed for dyspnea.  She reports recent exposure to strep - her grandkids had it over the last weekend. Her home COVID test was negative.  The patient does not have symptoms concerning for COVID-19 infection (fever, chills, cough, or new shortness of breath).   Past Medical, Surgical, Social History, Allergies, and Medications have been Reviewed.  Past Medical History:  Diagnosis Date   Arthritis    Asthma    Celiac  disease    Diabetes mellitus, type II (HCC)    Diabetic neuropathy (HCC)    Diastolic dysfunction    Grade 1 with preserved EF   Heart murmur    Hiatal hernia    Sliding   Hyperlipidemia    Hypertension    Migraine    Obstructive sleep apnea    Osteoporosis    Vertigo    Past Surgical History:  Procedure Laterality Date   ABDOMINAL HYSTERECTOMY  09/29/2003   APPENDECTOMY     BALLOON DILATION N/A 02/08/2022   Procedure: BALLOON DILATION;  Surgeon: Monique Bal, DO;  [facility];  Service: Endoscopy;  Laterality: N/A;   BIOPSY  02/08/2022   Procedure: BIOPSY;  Surgeon: Monique Bal, DO;  [facility];  Service: Endoscopy;;   BREAST BIOPSY Left    CARPAL TUNNEL RELEASE     CATARACT EXTRACTION Bilateral    October and November 2016   COLONOSCOPY  09/2017   Blake Divine, MD in [place]; 5 mm tubular adenoma in the descending colon s/p resected, mild sigmoid diverticulosis, internal hemorrhoids.  Recommended repeat colonoscopy in 5 years.   CRANIOTOMY FOR ANEURYSM / VERTEBROBASILAR / CAROTID CIRCULATION Right 10/24/2015   ESOPHAGOGASTRODUODENOSCOPY  09/2017   [name]; irregular Z-line s/p biopsy, decreased LES tone, 4 cm hiatal hernia, erythema in gastric antrum biopsied, normal examined duodenum biopsied.  Esophageal biopsy suggestive of reflux, no Barrett's, gastric biopsy with nonspecific chronic gastritis with intestinal metaplasia, duodenal biopsy with increased intraepithelial lymphocytes without blunting of villi, nonspecific.   ESOPHAGOGASTRODUODENOSCOPY (EGD) WITH  PROPOFOL N/A 02/08/2022   Procedure: ESOPHAGOGASTRODUODENOSCOPY (EGD) WITH PROPOFOL;  Surgeon: Monique Bal, DO;  [facility];  Service: Endoscopy;  Laterality: N/A;  12:00pm   LAPAROSCOPY     UTERINE FIBROID SURGERY       Current Meds  Medication Sig   AIMOVIG 140 MG/ML SOAJ INJECT 140 MG UNDER THE SKIN EVERY 30 DAYS   albuterol (VENTOLIN HFA) 108 (90 Base) MCG/ACT  inhaler Inhale 2 puffs into the lungs every 6 (six) hours as needed for wheezing or shortness of breath.   atorvastatin (LIPITOR) 40 MG tablet Take 40 mg by mouth daily.   buPROPion (WELLBUTRIN XL) 300 MG 24 hr tablet TAKE 1 TABLET DAILY   diltiazem (CARDIZEM SR) 120 MG 12 hr capsule Take 2 capsules (240 mg total) by mouth daily.   DULoxetine (CYMBALTA) 60 MG capsule Take 1 capsule (60 mg total) by mouth daily. (Patient taking differently: Take 60 mg by mouth at bedtime.)   Estradiol 10 MCG TABS vaginal tablet Place 10 mcg vaginally every Monday.   famotidine (PEPCID) 40 MG tablet TAKE 1 TABLET DAILY   fluticasone (FLOVENT HFA) 110 MCG/ACT inhaler Inhale 2 puffs into the lungs 2 (two) times daily.   Lancets (ONETOUCH DELICA PLUS LANCET33G) MISC Use to check glucose once daily   metoprolol succinate (TOPROL-XL) 50 MG 24 hr tablet Take 0.5 tablets (25 mg total) by mouth daily for 14 days, THEN 1 tablet (50 mg total) daily. Take with or immediately following a meal..   ONETOUCH ULTRA test strip Use to monitor glucose once daily   OZEMPIC, 1 MG/DOSE, 4 MG/3ML SOPN INJECT 1 MG ONCE A WEEK AS DIRECTED   PROLIA 60 MG/ML SOSY injection Inject 60 mg into the skin every 6 (six) months.   RABEprazole (ACIPHEX) 20 MG tablet Take 20 mg by mouth 2 (two) times daily.   zolmitriptan (ZOMIG-ZMT) 5 MG disintegrating tablet Take 5 mg by mouth as needed for migraine.     Allergies:   Cefuroxime, Codeine, Sulfa antibiotics, and Gluten meal   ROS:   Please see the history of present illness.     All other systems reviewed and are negative.   Labs/Other Tests and Data Reviewed:    Recent Labs: 10/02/2021: TSH 2.830 11/30/2021: BUN 15; Creatinine, Ser 0.92; Potassium 3.7; Sodium 140 01/07/2022: Hemoglobin 15.6; Platelets 347 01/31/2022: ALT 64   Recent Lipid Panel Lab Results  Component Value Date/Time   CHOL 184 10/02/2021 04:26 PM   TRIG 137 10/02/2021 04:26 PM   HDL 51 10/02/2021 04:26 PM   CHOLHDL 3.6  10/02/2021 04:26 PM   LDLCALC 109 (H) 10/02/2021 04:26 PM    Wt Readings from Last 3 Encounters:  02/08/22 197 lb (89.4 kg)  02/07/22 197 lb (89.4 kg)  01/31/22 197 lb 3.2 oz (89.4 kg)     ASSESSMENT & PLAN:    Acute sinusitis Started empiric azithromycin due to recent exposure to strep Continue nasal saline spray as needed for nasal congestion Advised to use humidifier or vaporizer Albuterol inhaler for dyspnea/wheezing for asthma Robitussin as needed for cough  Time:   Today, I have spent 9 minutes reviewing the chart, including problem list, medications, and with the patient with telehealth technology discussing the above problems.   Medication Adjustments/Labs and Tests Ordered: Current medicines are reviewed at length with the patient today.  Concerns regarding medicines are outlined above.   Tests Ordered: No orders of the defined types were placed in this encounter.  Medication Changes: No orders of the defined types were placed in this encounter.    Note: This dictation was prepared with Dragon dictation along with smaller phrase technology. Similar sounding words can be transcribed inadequately or may not be corrected upon review. Any transcriptional errors that result from this process are unintentional.      Disposition:  Follow up  Signed, Anabel Halon, MD  03/26/2022 11:59 AM     [facility]

## 2022-03-26 NOTE — Addendum Note (Signed)
Addended by: Ishmael Holter R on: 03/26/2022 01:19 PM   Modules accepted: Orders

## 2022-03-27 ENCOUNTER — Other Ambulatory Visit: Payer: Self-pay | Admitting: Orthopedic Surgery

## 2022-03-27 ENCOUNTER — Telehealth: Payer: Self-pay | Admitting: Radiology

## 2022-03-27 DIAGNOSIS — M1712 Unilateral primary osteoarthritis, left knee: Secondary | ICD-10-CM

## 2022-03-27 DIAGNOSIS — S83242D Other tear of medial meniscus, current injury, left knee, subsequent encounter: Secondary | ICD-10-CM

## 2022-03-27 NOTE — Telephone Encounter (Signed)
NO PRECERT REQUIRED for the knee arthroscopy with [employer] according to [employer] [address] CPT

## 2022-03-27 NOTE — Progress Notes (Signed)
Mailed order for walker to patient pre op

## 2022-04-01 ENCOUNTER — Encounter: Payer: Self-pay | Admitting: Internal Medicine

## 2022-04-01 ENCOUNTER — Ambulatory Visit (INDEPENDENT_AMBULATORY_CARE_PROVIDER_SITE_OTHER): Payer: 59 | Admitting: Internal Medicine

## 2022-04-01 VITALS — BP 118/68 | HR 60 | Ht 62.0 in | Wt 192.6 lb

## 2022-04-01 DIAGNOSIS — K219 Gastro-esophageal reflux disease without esophagitis: Secondary | ICD-10-CM

## 2022-04-01 DIAGNOSIS — E559 Vitamin D deficiency, unspecified: Secondary | ICD-10-CM

## 2022-04-01 DIAGNOSIS — Z0001 Encounter for general adult medical examination with abnormal findings: Secondary | ICD-10-CM | POA: Diagnosis not present

## 2022-04-01 DIAGNOSIS — I10 Essential (primary) hypertension: Secondary | ICD-10-CM | POA: Diagnosis not present

## 2022-04-01 DIAGNOSIS — E782 Mixed hyperlipidemia: Secondary | ICD-10-CM | POA: Diagnosis not present

## 2022-04-01 DIAGNOSIS — H538 Other visual disturbances: Secondary | ICD-10-CM

## 2022-04-01 DIAGNOSIS — E1140 Type 2 diabetes mellitus with diabetic neuropathy, unspecified: Secondary | ICD-10-CM

## 2022-04-01 DIAGNOSIS — J4540 Moderate persistent asthma, uncomplicated: Secondary | ICD-10-CM

## 2022-04-01 MED ORDER — ATORVASTATIN CALCIUM 40 MG PO TABS: 40.0000 mg | ORAL_TABLET | Freq: Every day | ORAL | 3 refills | Status: DC

## 2022-04-01 MED ORDER — AIRDUO DIGIHALER 232-14 MCG/ACT IN AEPB: 1.0000 | INHALATION_SPRAY | Freq: Two times a day (BID) | RESPIRATORY_TRACT | 5 refills | Status: DC

## 2022-04-01 NOTE — Assessment & Plan Note (Signed)
Takes Pepcid and rabeprazole DC rabeprazole as she has history of osteoporosis

## 2022-04-01 NOTE — Assessment & Plan Note (Signed)
BP Readings from Last 1 Encounters:  04/01/22 118/68   Usually well-controlled with lisinopril Counseled for compliance with the medications Advised DASH diet and moderate exercise/walking, at least 150 mins/week

## 2022-04-01 NOTE — Patient Instructions (Addendum)
Please continue taking medications as prescribed.  Please continue to follow low carb diet and perform moderate exercise/walking at least 150 mins/week.  Please get fasting blood tests done before the next visit. 

## 2022-04-01 NOTE — Progress Notes (Signed)
Established Patient Office Visit  Subjective:  Patient ID: Monique Warren, female    DOB: 1951-07-10  Age: 71 y.o. MRN: [id number]  CC:  Chief Complaint  Patient presents with   Annual Exam   Referral    For cataracts     HPI Monique Warren is a 71 y.o. female with past medical history of hypertension, cerebral artery aneurysm s/p clip, migraine, asthma, OSA, celiac disease, GERD, type II DM, osteoporosis, depression with anxiety and obesity who presents for annual physical.  HTN: BP is well-controlled. Takes medications regularly. Patient denies headache, dizziness, chest pain, dyspnea or palpitations.  Type II DM: Her HbA1C was 6.6 in 04/23. Her Ozempic dose was increased by endocrinology.  Her blood glucose remains around 100-120 most of the time still.  She denies any polyuria or polydipsia.  Migraine: Takes Aimovig and feels better now. Takes zolmitriptan as needed for breakthrough headache.  She is going to get knee arthroscopy with medial meniscectomy for medial meniscus tear in the next week.  She complains of blurry vision despite cataract surgery.  She was told of having a lesion behind her lens in the past.  Denies any eye pain or discharge currently.     Past Medical History:  Diagnosis Date   Arthritis    Asthma    Celiac disease    Diabetes mellitus, type II ([place])    Diabetic neuropathy ([place])    Diastolic dysfunction    Grade 1 with preserved EF   Heart murmur    Hiatal hernia    Sliding   Hyperlipidemia    Hypertension    Migraine    Obstructive sleep apnea    Osteoporosis    Vertigo     Past Surgical History:  Procedure Laterality Date   ABDOMINAL HYSTERECTOMY  09/29/2003   APPENDECTOMY     BALLOON DILATION N/A 02/08/2022   Procedure: BALLOON DILATION;  Surgeon: Eloise Harman, DO;  [facility];  Service: Endoscopy;  Laterality: N/A;   BIOPSY  02/08/2022   Procedure: BIOPSY;  Surgeon: Eloise Harman, DO;  [facility];  Service: Endoscopy;;   BREAST BIOPSY Left    CARPAL TUNNEL RELEASE     CATARACT EXTRACTION Bilateral    October and November 2016   COLONOSCOPY  09/2017   [name], MD in [place]; 5 mm tubular adenoma in the descending colon s/p resected, mild sigmoid diverticulosis, internal hemorrhoids.  Recommended repeat colonoscopy in 5 years.   CRANIOTOMY FOR ANEURYSM / VERTEBROBASILAR / CAROTID CIRCULATION Right 10/24/2015   ESOPHAGOGASTRODUODENOSCOPY  09/2017   [name]; irregular Z-line s/p biopsy, decreased LES tone, 4 cm hiatal hernia, erythema in gastric antrum biopsied, normal examined duodenum biopsied.  Esophageal biopsy suggestive of reflux, no Barrett's, gastric biopsy with nonspecific chronic gastritis with intestinal metaplasia, duodenal biopsy with increased intraepithelial lymphocytes without blunting of villi, nonspecific.   ESOPHAGOGASTRODUODENOSCOPY (EGD) WITH PROPOFOL N/A 02/08/2022   Procedure: ESOPHAGOGASTRODUODENOSCOPY (EGD) WITH PROPOFOL;  Surgeon: Eloise Harman, DO;  [facility];  Service: Endoscopy;  Laterality: N/A;  12:00pm   LAPAROSCOPY     UTERINE FIBROID SURGERY      Family History  Problem Relation Age of Onset   Breast cancer Mother    Thyroid disease Mother    Stroke Mother    Hypertension Father    Hyperlipidemia Father    Heart attack Father    Heart failure Father    Breast cancer Paternal Grandmother  Cancer - Colon Neg Hx    Gastric cancer Neg Hx    Esophageal cancer Neg Hx     Social History   Socioeconomic History   Marital status: Married    Spouse name: Not on file   Number of children: Not on file   Years of education: Not on file   Highest education level: Not on file  Occupational History   Not on file  Tobacco Use   Smoking status: Former    Packs/day: 0.50    Types: Cigarettes   Smokeless tobacco: Never  Vaping Use   Vaping Use: Never used  Substance and Sexual Activity   Alcohol use: Yes     Alcohol/week: 4.0 standard drinks of alcohol    Types: 2 Glasses of wine, 2 Shots of liquor per week    Comment: on the weekends   Drug use: Never   Sexual activity: Not on file  Other Topics Concern   Not on file  Social History Narrative   Not on file   Social Determinants of Health   Financial Resource Strain: Not on file  Food Insecurity: Not on file  Transportation Needs: Not on file  Physical Activity: Not on file  Stress: Not on file  Social Connections: Not on file  Intimate Partner Violence: Not on file    Outpatient Medications Prior to Visit  Medication Sig Dispense Refill   acetaminophen (TYLENOL) 500 MG tablet Take 1,000 mg by mouth every 6 (six) hours as needed for moderate pain.     AIMOVIG 140 MG/ML SOAJ INJECT 140 MG UNDER THE SKIN EVERY 30 DAYS 1 mL 11   albuterol (VENTOLIN HFA) 108 (90 Base) MCG/ACT inhaler Inhale 2 puffs into the lungs every 6 (six) hours as needed for wheezing or shortness of breath. 18 g 0   buPROPion (WELLBUTRIN XL) 300 MG 24 hr tablet TAKE 1 TABLET DAILY 90 tablet 3   diltiazem (CARDIZEM SR) 120 MG 12 hr capsule Take 2 capsules (240 mg total) by mouth daily. 180 capsule 2   DULoxetine (CYMBALTA) 60 MG capsule Take 1 capsule (60 mg total) by mouth daily. (Patient taking differently: Take 60 mg by mouth at bedtime.) 90 capsule 1   Estradiol 10 MCG TABS vaginal tablet Place 10 mcg vaginally every Monday.     famotidine (PEPCID) 40 MG tablet TAKE 1 TABLET DAILY (Patient taking differently: Take 40 mg by mouth at bedtime.) 90 tablet 3   Lancets (ONETOUCH DELICA PLUS VBTYOM60O) MISC Use to check glucose once daily 100 each 6   metoprolol succinate (TOPROL-XL) 50 MG 24 hr tablet Take 0.5 tablets (25 mg total) by mouth daily for 14 days, THEN 1 tablet (50 mg total) daily. Take with or immediately following a meal.. (Patient taking differently: Take 1 tablet (50 mg total) daily. Take with or immediately following a meal.) 90 tablet 3   Multiple Vitamin  (MULTIVITAMIN WITH MINERALS) TABS tablet Take 1 tablet by mouth daily.     ONETOUCH ULTRA test strip Use to monitor glucose once daily 100 each 6   OZEMPIC, 1 MG/DOSE, 4 MG/3ML SOPN INJECT 1 MG ONCE A WEEK AS DIRECTED 6 mL 1   PROLIA 60 MG/ML SOSY injection Inject 60 mg into the skin every 6 (six) months. 1 mL 1   zolmitriptan (ZOMIG-ZMT) 5 MG disintegrating tablet Take 5 mg by mouth as needed for migraine.     atorvastatin (LIPITOR) 40 MG tablet Take 40 mg by mouth at bedtime.  fluticasone (FLOVENT HFA) 110 MCG/ACT inhaler Inhale 2 puffs into the lungs 2 (two) times daily.     RABEprazole (ACIPHEX) 20 MG tablet Take 20 mg by mouth 2 (two) times daily.     azithromycin (ZITHROMAX) 250 MG tablet Take 2 tablets on day 1, then 1 tablet daily on days 2 through 5 (Patient not taking: Reported on 04/01/2022) 6 tablet 0   No facility-administered medications prior to visit.    Allergies  Allergen Reactions   Cefuroxime Anaphylaxis, Hives, Shortness Of Breath, Swelling and Anxiety   Codeine Hives, Shortness Of Breath, Nausea And Vomiting, Swelling and Anxiety   Sulfa Antibiotics Anaphylaxis, Hives, Nausea And Vomiting and Swelling    Swollen throat   Gluten Meal     Severe reflux - celiac disease    ROS Review of Systems  Constitutional:  Negative for chills and fever.  HENT:  Negative for congestion, sinus pressure, sinus pain and sore throat.   Eyes:  Negative for pain and discharge.  Respiratory:  Negative for cough and shortness of breath.   Cardiovascular:  Negative for chest pain and palpitations.  Gastrointestinal:  Positive for constipation. Negative for abdominal pain, diarrhea, nausea and vomiting.  Endocrine: Negative for polydipsia and polyuria.  Genitourinary:  Negative for dysuria and hematuria.  Musculoskeletal:  Positive for arthralgias (Left knee and heel) and back pain. Negative for neck pain and neck stiffness.  Skin:  Negative for rash.  Neurological:  Positive for  headaches. Negative for dizziness and weakness.  Psychiatric/Behavioral:  Negative for agitation and behavioral problems.       Objective:    Physical Exam Vitals reviewed.  Constitutional:      General: She is not in acute distress.    Appearance: She is obese. She is not diaphoretic.  HENT:     Head: Normocephalic and atraumatic.     Nose: Nose normal.     Mouth/Throat:     Mouth: Mucous membranes are moist.  Eyes:     General: No scleral icterus.    Extraocular Movements: Extraocular movements intact.  Cardiovascular:     Rate and Rhythm: Normal rate and regular rhythm.     Pulses: Normal pulses.     Heart sounds: Murmur (Systolic over right upper sternal border) heard.  Pulmonary:     Breath sounds: Normal breath sounds. No wheezing or rales.  Abdominal:     Palpations: Abdomen is soft.     Tenderness: There is no abdominal tenderness.  Musculoskeletal:     Cervical back: Neck supple. No tenderness.     Right lower leg: No edema.     Left lower leg: No edema.     Comments: ROM limited at left knee due to pain  Skin:    General: Skin is warm.     Findings: No rash.  Neurological:     General: No focal deficit present.     Mental Status: She is alert and oriented to person, place, and time.     Cranial Nerves: No cranial nerve deficit.     Sensory: No sensory deficit.     Motor: No weakness.  Psychiatric:        Mood and Affect: Mood normal.        Behavior: Behavior normal.     BP 118/68   Pulse 60   Ht _0  (1.575 m)   Wt 192 lb 9.6 oz (87.4 kg)   SpO2 98%   BMI 35.23 kg/m  Wt Readings from  Last 3 Encounters:  04/01/22 192 lb 9.6 oz (87.4 kg)  02/08/22 197 lb (89.4 kg)  02/07/22 197 lb (89.4 kg)    Lab Results  Component Value Date   TSH 2.830 10/02/2021   Lab Results  Component Value Date   WBC 8.3 01/07/2022   HGB 15.6 (H) 01/07/2022   HCT 45.5 01/07/2022   MCV 95.8 01/07/2022   PLT 347 01/07/2022   Lab Results  Component Value Date    NA 140 11/30/2021   K 3.7 11/30/2021   CO2 23 11/30/2021   GLUCOSE 194 (H) 11/30/2021   BUN 15 11/30/2021   CREATININE 0.92 11/30/2021   BILITOT 0.6 01/31/2022   ALKPHOS 123 (H) 10/02/2021   AST 50 (H) 01/31/2022   ALT 64 (H) 01/31/2022   PROT 6.6 01/31/2022   ALBUMIN 4.7 10/02/2021   CALCIUM 9.2 11/30/2021   ANIONGAP 11 11/30/2021   EGFR 58 (L) 10/02/2021   Lab Results  Component Value Date   CHOL 184 10/02/2021   Lab Results  Component Value Date   HDL 51 10/02/2021   Lab Results  Component Value Date   LDLCALC 109 (H) 10/02/2021   Lab Results  Component Value Date   TRIG 137 10/02/2021   Lab Results  Component Value Date   CHOLHDL 3.6 10/02/2021   Lab Results  Component Value Date   HGBA1C 6.6 01/07/2022      Assessment & Plan:   Problem List Items Addressed This Visit       Cardiovascular and Mediastinum   HTN (hypertension)    BP Readings from Last 1 Encounters:  04/01/22 118/68  Usually well-controlled with lisinopril Counseled for compliance with the medications Advised DASH diet and moderate exercise/walking, at least 150 mins/week      Relevant Medications   atorvastatin (LIPITOR) 40 MG tablet     Respiratory   Asthma    Uses Airduo and PRN Albuterol Well-controlled currently      Relevant Medications   Fluticasone-Salmeterol,sensor, (AIRDUO DIGIHALER) 232-14 MCG/ACT AEPB     Digestive   Gastroesophageal reflux disease    Takes Pepcid and rabeprazole DC rabeprazole as she has history of osteoporosis        Endocrine   Type 2 diabetes mellitus with diabetic neuropathy, unspecified ([place])    Lab Results  Component Value Date   HGBA1C 6.6 01/07/2022  Associated with HTN, GERD and depression Well controlled now On Ozempic, followed by Endocrinology On statin and ACEi Diabetic eye exam: Referred to Ophthalmology On Cymbalta for neuropathy      Relevant Medications   atorvastatin (LIPITOR) 40 MG tablet   Other Relevant Orders    Hemoglobin A1c   CMP14+EGFR   Urine Microalbumin w/creat. ratio     Other   Hyperlipidemia   Relevant Medications   atorvastatin (LIPITOR) 40 MG tablet   Other Relevant Orders   Lipid panel   Encounter for general adult medical examination with abnormal findings - Primary    Physical exam as documented. Fasting blood tests ordered.      Relevant Orders   TSH   CMP14+EGFR   CBC with Differential/Platelet   Other Visit Diagnoses     Vitamin D deficiency       Relevant Orders   VITAMIN D 25 Hydroxy (Vit-D Deficiency, Fractures)   Blurry vision, bilateral       Relevant Orders   Ambulatory referral to Ophthalmology       Meds ordered this encounter  Medications  atorvastatin (LIPITOR) 40 MG tablet    Sig: Take 1 tablet (40 mg total) by mouth at bedtime.    Dispense:  90 tablet    Refill:  3   Fluticasone-Salmeterol,sensor, (AIRDUO DIGIHALER) 232-14 MCG/ACT AEPB    Sig: Inhale 1 Inhalation into the lungs 2 (two) times daily.    Dispense:  1 each    Refill:  5    Follow-up: Return in about 6 months (around 10/02/2022) for HTN and DM.    Lindell Spar, MD

## 2022-04-01 NOTE — Assessment & Plan Note (Signed)
Physical exam as documented. Fasting blood tests ordered. 

## 2022-04-01 NOTE — Assessment & Plan Note (Addendum)
Lab Results  Component Value Date   HGBA1C 6.6 01/07/2022   Associated with HTN, GERD and depression Well controlled now On Ozempic, followed by Endocrinology On statin and ACEi Diabetic eye exam: Referred to Ophthalmology On Cymbalta for neuropathy

## 2022-04-01 NOTE — Assessment & Plan Note (Addendum)
Uses Airduo and PRN Albuterol Well-controlled currently

## 2022-04-03 ENCOUNTER — Other Ambulatory Visit: Payer: Self-pay | Admitting: Internal Medicine

## 2022-04-03 LAB — MICROALBUMIN / CREATININE URINE RATIO
Creatinine, Urine: 140.5 mg/dL
Microalb/Creat Ratio: 5 mg/g creat (ref 0–29)
Microalbumin, Urine: 7 ug/mL

## 2022-04-03 NOTE — Patient Instructions (Signed)
Monique Warren  04/03/2022     @   Your procedure is scheduled on  04/09/2022.   Report to [facility] at  0600  A.M.   Call this number if you have problems the morning of surgery:  [phone]     Your last dose of ozempic should have been on 7/18 or before.    Remember:  Do not eat or drink after midnight.        Use your inhaler before you come and bring your rescue inhaler with you.       DO NOT take any medications for diabetes the morninf of your procedure.    Take these medicines the morning of surgery with A SIP OF WATER      wellbutrin, cardiazem, metoprolol, zomig (if needed).     Do not wear jewelry, make-up or nail polish.  Do not wear lotions, powders, or perfumes, or deodorant.  Do not shave 48 hours prior to surgery.  Men may shave face and neck.  Do not bring valuables to the hospital.  [facility] is not responsible for any belongings or valuables.  Contacts, dentures or bridgework may not be worn into surgery.  Leave your suitcase in the car.  After surgery it may be brought to your room.  For patients admitted to the hospital, discharge time will be determined by your treatment team.  Patients discharged the day of surgery will not be allowed to drive home and must have someone with them for 24 hours.     Special instructions:   DO NOT smoke tobacco or vape for 24 hours before your procedure.  Please read over the following fact sheets that you were given. Coughing and Deep Breathing, Surgical Site Infection Prevention, Anesthesia Post-op Instructions, and Care and Recovery After Surgery      Arthroscopic Knee Ligament Repair, Care After This sheet gives you information about how to care for yourself after your procedure. Your health care provider may also give you more specific instructions. If you have problems or questions, contact your health care provider. What can I expect after the procedure? After  the procedure, it is common to have: Soreness or pain in your knee. Bruising and swelling on your knee, calf, and ankle for 3-4 days. A small amount of fluid coming from the incisions. Follow these instructions at home: Medicines Take over-the-counter and prescription medicines only as told by your health care provider. Ask your health care provider if the medicine prescribed to you: Requires you to avoid driving or using machinery. Can cause constipation. You may need to take these actions to prevent or treat constipation: Drink enough fluid to keep your urine pale yellow. Take over-the-counter or prescription medicines. Eat foods that are high in fiber, such as beans, whole grains, and fresh fruits and vegetables. Limit foods that are high in fat and processed sugars, such as fried or sweet foods. If you have a brace or immobilizer: Wear it as told by your health care provider. Remove it only as told by your health care provider. Loosen it if your toes tingle, become numb, or turn cold and blue. Keep it clean and dry. Ask your health care provider when it is safe to drive. Bathing Do not take baths, swim, or use a hot tub until your health care provider approves. Keep your bandage (dressing) dry until your health care provider says that it can be removed. If the brace or  immobilizer is not waterproof: Do not let it get wet. Cover it with a watertight covering when you take a bath or shower. Incision care  Follow instructions from your health care provider about how to take care of your incisions. Make sure you: Wash your hands with soap and water for at least 20 seconds before and after you change your dressing. If soap and water are not available, use hand sanitizer. Change your dressing as told by your health care provider. Leave stitches (sutures), skin glue, or adhesive strips in place. These skin closures may need to stay in place for 2 weeks or longer. If adhesive strip edges  start to loosen and curl up, you may trim the loose edges. Do not remove adhesive strips completely unless your health care provider tells you to do that. Check your incision areas every day for signs of infection. Check for: Redness. More swelling or pain. Blood or more fluid. Warmth. Pus or a bad smell. Managing pain, stiffness, and swelling  If directed, put ice on the affected area. To do this: If you have a removable brace or immobilizer, remove it as told by your health care provider. Put ice in a plastic bag. Place a towel between your skin and the bag. Leave the ice on for 20 minutes, 2-3 times a day. Remove the ice if your skin turns bright red. This is very important. If you cannot feel pain, heat, or cold, you have a greater risk of damage to the area. Move your toes often to reduce stiffness and swelling. Raise (elevate) the injured area above the level of your heart while you are sitting or lying down. Activity Do not use your knee to support your body weight until your health care provider says that you can. Use crutches or other devices as told by your health care provider. Do physical therapy exercises as told by your health care provider. Physical therapy will help you regain movement and strength in your knee. Follow instructions from your health care provider about: When you may start motion exercises. When you may start riding a stationary bike and doing other low-impact activities. When you may start to jog and do other high-impact activities. Do not lift anything that is heavier than 10 lb (4.5 kg), or the limit that you are told, until your health care provider says that it is safe. Ask your health care provider what activities are safe for you. General instructions Do not use any products that contain nicotine or tobacco, such as cigarettes, e-cigarettes, and chewing tobacco. These can delay healing. If you need help quitting, ask your health care provider. Wear  compression stockings as told by your health care provider. These stockings help to prevent blood clots and reduce swelling in your legs. Keep all follow-up visits. This is important. Contact a health care provider if: You have any of these signs of infection: Redness around an incision. Blood or more fluid coming from an incision. Warmth coming from an incision. Pus or a bad smell coming from an incision. More swelling or pain in your knee. A fever or chills. You have pain that does not get better with medicine. Your incision opens up. Get help right away if: You have trouble breathing. You have chest pain. You have increased pain or swelling in your calf or at the back of your knee. You have numbness and tingling near the knee joint or in the foot, ankle, or toes. You notice that your foot or toes look  darker than normal or are cooler than normal. These symptoms may represent a serious problem that is an emergency. Do not wait to see if the symptoms will go away. Get medical help right away. Call your local emergency services (911 in the [place] Do not drive yourself to the hospital. Summary After the procedure, it is common to have knee pain with bruising and swelling on your knee, calf, and ankle. Icing your knee and raising your leg above the level of your heart will help control the pain and swelling. Do physical therapy exercises as told by your health care provider. Physical therapy will help you regain movement and strength in your knee. This information is not intended to replace advice given to you by your health care provider. Make sure you discuss any questions you have with your health care provider. Document Revised: 01/31/2020 Document Reviewed: 01/31/2020 Elsevier Patient Education  2023 [employer] General Anesthesia, Adult, Care After This sheet gives you information about how to care for yourself after your procedure. Your health care provider may also give you more  specific instructions. If you have problems or questions, contact your health care provider. What can I expect after the procedure? After the procedure, the following side effects are common: Pain or discomfort at the IV site. Nausea. Vomiting. Sore throat. Trouble concentrating. Feeling cold or chills. Feeling weak or tired. Sleepiness and fatigue. Soreness and body aches. These side effects can affect parts of the body that were not involved in surgery. Follow these instructions at home: For the time period you were told by your health care provider:  Rest. Do not participate in activities where you could fall or become injured. Do not drive or use machinery. Do not drink alcohol. Do not take sleeping pills or medicines that cause drowsiness. Do not make important decisions or sign legal documents. Do not take care of children on your own. Eating and drinking Follow any instructions from your health care provider about eating or drinking restrictions. When you feel hungry, start by eating small amounts of foods that are soft and easy to digest (bland), such as toast. Gradually return to your regular diet. Drink enough fluid to keep your urine pale yellow. If you vomit, rehydrate by drinking water, juice, or clear broth. General instructions If you have sleep apnea, surgery and certain medicines can increase your risk for breathing problems. Follow instructions from your health care provider about wearing your sleep device: Anytime you are sleeping, including during daytime naps. While taking prescription pain medicines, sleeping medicines, or medicines that make you drowsy. Have a responsible adult stay with you for the time you are told. It is important to have someone help care for you until you are awake and alert. Return to your normal activities as told by your health care provider. Ask your health care provider what activities are safe for you. Take over-the-counter and  prescription medicines only as told by your health care provider. If you smoke, do not smoke without supervision. Keep all follow-up visits as told by your health care provider. This is important. Contact a health care provider if: You have nausea or vomiting that does not get better with medicine. You cannot eat or drink without vomiting. You have pain that does not get better with medicine. You are unable to pass urine. You develop a skin rash. You have a fever. You have redness around your IV site that gets worse. Get help right away if: You have difficulty breathing. You  have chest pain. You have blood in your urine or stool, or you vomit blood. Summary After the procedure, it is common to have a sore throat or nausea. It is also common to feel tired. Have a responsible adult stay with you for the time you are told. It is important to have someone help care for you until you are awake and alert. When you feel hungry, start by eating small amounts of foods that are soft and easy to digest (bland), such as toast. Gradually return to your regular diet. Drink enough fluid to keep your urine pale yellow. Return to your normal activities as told by your health care provider. Ask your health care provider what activities are safe for you. This information is not intended to replace advice given to you by your health care provider. Make sure you discuss any questions you have with your health care provider. Document Revised: 05/18/2020 Document Reviewed: 12/16/2019 Elsevier Patient Education  2023 [employer] How to Use Chlorhexidine for Bathing Chlorhexidine gluconate (CHG) is a germ-killing (antiseptic) solution that is used to clean the skin. It can get rid of the bacteria that normally live on the skin and can keep them away for about 24 hours. To clean your skin with CHG, you may be given: A CHG solution to use in the shower or as part of a sponge bath. A prepackaged cloth that  contains CHG. Cleaning your skin with CHG may help lower the risk for infection: While you are staying in the intensive care unit of the hospital. If you have a vascular access, such as a central line, to provide short-term or long-term access to your veins. If you have a catheter to drain urine from your bladder. If you are on a ventilator. A ventilator is a machine that helps you breathe by moving air in and out of your lungs. After surgery. What are the risks? Risks of using CHG include: A skin reaction. Hearing loss, if CHG gets in your ears and you have a perforated eardrum. Eye injury, if CHG gets in your eyes and is not rinsed out. The CHG product catching fire. Make sure that you avoid smoking and flames after applying CHG to your skin. Do not use CHG: If you have a chlorhexidine allergy or have previously reacted to chlorhexidine. On babies younger than 59 months of age. How to use CHG solution Use CHG only as told by your health care provider, and follow the instructions on the label. Use the full amount of CHG as directed. Usually, this is one bottle. During a shower Follow these steps when using CHG solution during a shower (unless your health care provider gives you different instructions): Start the shower. Use your normal soap and shampoo to wash your face and hair. Turn off the shower or move out of the shower stream. Pour the CHG onto a clean washcloth. Do not use any type of brush or rough-edged sponge. Starting at your neck, lather your body down to your toes. Make sure you follow these instructions: If you will be having surgery, pay special attention to the part of your body where you will be having surgery. Scrub this area for at least 1 minute. Do not use CHG on your head or face. If the solution gets into your ears or eyes, rinse them well with water. Avoid your genital area. Avoid any areas of skin that have broken skin, cuts, or scrapes. Scrub your back and  under your arms. Make sure  to wash skin folds. Let the lather sit on your skin for 1-2 minutes or as long as told by your health care provider. Thoroughly rinse your entire body in the shower. Make sure that all body creases and crevices are rinsed well. Dry off with a clean towel. Do not put any substances on your body afterward--such as powder, lotion, or perfume--unless you are told to do so by your health care provider. Only use lotions that are recommended by the manufacturer. Put on clean clothes or pajamas. If it is the night before your surgery, sleep in clean sheets.  During a sponge bath Follow these steps when using CHG solution during a sponge bath (unless your health care provider gives you different instructions): Use your normal soap and shampoo to wash your face and hair. Pour the CHG onto a clean washcloth. Starting at your neck, lather your body down to your toes. Make sure you follow these instructions: If you will be having surgery, pay special attention to the part of your body where you will be having surgery. Scrub this area for at least 1 minute. Do not use CHG on your head or face. If the solution gets into your ears or eyes, rinse them well with water. Avoid your genital area. Avoid any areas of skin that have broken skin, cuts, or scrapes. Scrub your back and under your arms. Make sure to wash skin folds. Let the lather sit on your skin for 1-2 minutes or as long as told by your health care provider. Using a different clean, wet washcloth, thoroughly rinse your entire body. Make sure that all body creases and crevices are rinsed well. Dry off with a clean towel. Do not put any substances on your body afterward--such as powder, lotion, or perfume--unless you are told to do so by your health care provider. Only use lotions that are recommended by the manufacturer. Put on clean clothes or pajamas. If it is the night before your surgery, sleep in clean sheets. How to use  CHG prepackaged cloths Only use CHG cloths as told by your health care provider, and follow the instructions on the label. Use the CHG cloth on clean, dry skin. Do not use the CHG cloth on your head or face unless your health care provider tells you to. When washing with the CHG cloth: Avoid your genital area. Avoid any areas of skin that have broken skin, cuts, or scrapes. Before surgery Follow these steps when using a CHG cloth to clean before surgery (unless your health care provider gives you different instructions): Using the CHG cloth, vigorously scrub the part of your body where you will be having surgery. Scrub using a back-and-forth motion for 3 minutes. The area on your body should be completely wet with CHG when you are done scrubbing. Do not rinse. Discard the cloth and let the area air-dry. Do not put any substances on the area afterward, such as powder, lotion, or perfume. Put on clean clothes or pajamas. If it is the night before your surgery, sleep in clean sheets.  For general bathing Follow these steps when using CHG cloths for general bathing (unless your health care provider gives you different instructions). Use a separate CHG cloth for each area of your body. Make sure you wash between any folds of skin and between your fingers and toes. Wash your body in the following order, switching to a new cloth after each step: The front of your neck, shoulders, and chest. Both of your  arms, under your arms, and your hands. Your stomach and groin area, avoiding the genitals. Your right leg and foot. Your left leg and foot. The back of your neck, your back, and your buttocks. Do not rinse. Discard the cloth and let the area air-dry. Do not put any substances on your body afterward--such as powder, lotion, or perfume--unless you are told to do so by your health care provider. Only use lotions that are recommended by the manufacturer. Put on clean clothes or pajamas. Contact a health  care provider if: Your skin gets irritated after scrubbing. You have questions about using your solution or cloth. You swallow any chlorhexidine. Call your local poison control center [phone] in the [place] Get help right away if: Your eyes itch badly, or they become very red or swollen. Your skin itches badly and is red or swollen. Your hearing changes. You have trouble seeing. You have swelling or tingling in your mouth or throat. You have trouble breathing. These symptoms may represent a serious problem that is an emergency. Do not wait to see if the symptoms will go away. Get medical help right away. Call your local emergency services (911 in the [place] Do not drive yourself to the hospital. Summary Chlorhexidine gluconate (CHG) is a germ-killing (antiseptic) solution that is used to clean the skin. Cleaning your skin with CHG may help to lower your risk for infection. You may be given CHG to use for bathing. It may be in a bottle or in a prepackaged cloth to use on your skin. Carefully follow your health care provider's instructions and the instructions on the product label. Do not use CHG if you have a chlorhexidine allergy. Contact your health care provider if your skin gets irritated after scrubbing. This information is not intended to replace advice given to you by your health care provider. Make sure you discuss any questions you have with your health care provider. Document Revised: 11/13/2020 Document Reviewed: 11/13/2020 Elsevier Patient Education  2023 [employer]

## 2022-04-05 ENCOUNTER — Encounter (HOSPITAL_COMMUNITY): Payer: Self-pay

## 2022-04-05 ENCOUNTER — Encounter (HOSPITAL_COMMUNITY)
Admission: RE | Admit: 2022-04-05 | Discharge: 2022-04-05 | Disposition: A | Discharge status: Home / Self Care | Payer: 59 | Source: Ambulatory Visit | Attending: Orthopedic Surgery | Admitting: Orthopedic Surgery

## 2022-04-05 VITALS — BP 158/72 | HR 77 | Temp 98.5°F | Resp 18 | Ht 62.0 in | Wt 192.6 lb

## 2022-04-05 DIAGNOSIS — S83242D Other tear of medial meniscus, current injury, left knee, subsequent encounter: Secondary | ICD-10-CM | POA: Insufficient documentation

## 2022-04-05 DIAGNOSIS — Z01812 Encounter for preprocedural laboratory examination: Secondary | ICD-10-CM | POA: Diagnosis present

## 2022-04-05 DIAGNOSIS — E1140 Type 2 diabetes mellitus with diabetic neuropathy, unspecified: Secondary | ICD-10-CM | POA: Insufficient documentation

## 2022-04-05 DIAGNOSIS — X58XXXD Exposure to other specified factors, subsequent encounter: Secondary | ICD-10-CM | POA: Diagnosis not present

## 2022-04-05 HISTORY — DX: Depression, unspecified: F32.A

## 2022-04-05 HISTORY — DX: Anxiety disorder, unspecified: F41.9

## 2022-04-05 HISTORY — DX: Cardiac arrhythmia, unspecified: I49.9

## 2022-04-05 LAB — BASIC METABOLIC PANEL
Anion gap: 8 (ref 5–15)
BUN: 15 mg/dL (ref 8–23)
CO2: 21 mmol/L — ABNORMAL LOW (ref 22–32)
Calcium: 9.3 mg/dL (ref 8.9–10.3)
Chloride: 110 mmol/L (ref 98–111)
Creatinine, Ser: 0.79 mg/dL (ref 0.44–1.00)
GFR, Estimated: >60 mL/min (ref >60)
Glucose, Bld: 169 mg/dL — ABNORMAL HIGH (ref 70–99)
Potassium: 3.7 mmol/L (ref 3.5–5.1)
Sodium: 139 mmol/L (ref 135–145)

## 2022-04-05 LAB — CBC WITH DIFFERENTIAL/PLATELET
Abs Immature Granulocytes: 0.02 10*3/uL (ref 0.00–0.07)
Basophils Absolute: 0 10*3/uL (ref 0.0–0.1)
Basophils Relative: 0 %
Eosinophils Absolute: 0.1 10*3/uL (ref 0.0–0.5)
Eosinophils Relative: 1 %
HCT: 46.1 % — ABNORMAL HIGH (ref 36.0–46.0)
Hemoglobin: 15.3 g/dL — ABNORMAL HIGH (ref 12.0–15.0)
Immature Granulocytes: 0 %
Lymphocytes Relative: 32 %
Lymphs Abs: 2.3 10*3/uL (ref 0.7–4.0)
MCH: 31.5 pg (ref 26.0–34.0)
MCHC: 33.2 g/dL (ref 30.0–36.0)
MCV: 95.1 fL (ref 80.0–100.0)
Monocytes Absolute: 0.4 10*3/uL (ref 0.1–1.0)
Monocytes Relative: 6 %
Neutro Abs: 4.4 10*3/uL (ref 1.7–7.7)
Neutrophils Relative %: 61 %
Platelets: 320 10*3/uL (ref 150–400)
RBC: 4.85 MIL/uL (ref 3.87–5.11)
RDW: 12.8 % (ref 11.5–15.5)
WBC: 7.3 10*3/uL (ref 4.0–10.5)
nRBC: 0 % (ref 0.0–0.2)

## 2022-04-05 LAB — HEMOGLOBIN A1C
Hgb A1c MFr Bld: 6 % — ABNORMAL HIGH (ref 4.8–5.6)
Mean Plasma Glucose: 125.5 mg/dL

## 2022-04-08 NOTE — H&P (Signed)
Monique Warren is an 71 y.o. female.   Chief Complaint: Left knee pain  HPI: 71 year old female injured her left knee in the garden was treated nonoperatively failed nonoperative care she was sent for MRI and MRI showed she had a torn meniscus she opted for surgical treatment versus continued nonoperative treatment because the pain and function of the knee had deteriorated to the point where she could not tolerate it any further  Past Medical History:  Diagnosis Date   Anxiety    Arthritis    Asthma    Celiac disease    Depression    Diabetes mellitus, type II (HCC)    Diabetic neuropathy (HCC)    Diastolic dysfunction    Grade 1 with preserved EF   Dysrhythmia    Heart murmur    Hiatal hernia    Sliding   Hyperlipidemia    Hypertension    Migraine    Obstructive sleep apnea    Osteoporosis    Vertigo     Past Surgical History:  Procedure Laterality Date   ABDOMINAL HYSTERECTOMY  09/29/2003   APPENDECTOMY     BALLOON DILATION N/A 02/08/2022   Procedure: BALLOON DILATION;  Surgeon: Lanelle Bal, DO;  [facility];  Service: Endoscopy;  Laterality: N/A;   BIOPSY  02/08/2022   Procedure: BIOPSY;  Surgeon: Lanelle Bal, DO;  [facility];  Service: Endoscopy;;   BREAST BIOPSY Left    CARPAL TUNNEL RELEASE     CATARACT EXTRACTION Bilateral    October and November 2016   COLONOSCOPY  09/2017   Blake Divine, MD in [place]; 5 mm tubular adenoma in the descending colon s/p resected, mild sigmoid diverticulosis, internal hemorrhoids.  Recommended repeat colonoscopy in 5 years.   CRANIOTOMY FOR ANEURYSM / VERTEBROBASILAR / CAROTID CIRCULATION Right 10/24/2015   ESOPHAGOGASTRODUODENOSCOPY  09/2017   [name]; irregular Z-line s/p biopsy, decreased LES tone, 4 cm hiatal hernia, erythema in gastric antrum biopsied, normal examined duodenum biopsied.  Esophageal biopsy suggestive of reflux, no Barrett's, gastric biopsy with nonspecific chronic gastritis  with intestinal metaplasia, duodenal biopsy with increased intraepithelial lymphocytes without blunting of villi, nonspecific.   ESOPHAGOGASTRODUODENOSCOPY (EGD) WITH PROPOFOL N/A 02/08/2022   Procedure: ESOPHAGOGASTRODUODENOSCOPY (EGD) WITH PROPOFOL;  Surgeon: Lanelle Bal, DO;  [facility]: [facility];  Service: Endoscopy;  Laterality: N/A;  12:00pm   LAPAROSCOPY     UTERINE FIBROID SURGERY      Family History  Problem Relation Age of Onset   Breast cancer Mother    Thyroid disease Mother    Stroke Mother    Hypertension Father    Hyperlipidemia Father    Heart attack Father    Heart failure Father    Breast cancer Paternal Grandmother    Cancer - Colon Neg Hx    Gastric cancer Neg Hx    Esophageal cancer Neg Hx    Social History:  reports that she has quit smoking. Her smoking use included cigarettes. She smoked an average of .5 packs per day. She has never used smokeless tobacco. She reports current alcohol use of about 4.0 standard drinks of alcohol per week. She reports that she does not use drugs.  Allergies:  Allergies  Allergen Reactions   Cefuroxime Anaphylaxis, Hives, Shortness Of Breath, Swelling and Anxiety   Codeine Hives, Shortness Of Breath, Nausea And Vomiting, Swelling and Anxiety   Sulfa Antibiotics Anaphylaxis, Hives, Nausea And Vomiting and Swelling    Swollen throat  Gluten Meal     Severe reflux - celiac disease    No medications prior to admission.    No results found for this or any previous visit (from the past 48 hour(s)). No results found.  Review of Systems  There were no vitals taken for this visit. Physical Exam   Constitutional: Vital signs, General appearance normal appearance  Cardiovascular exam observation no swelling or varicosities palpation pulses normal temperature normal no edema or tenderness  Neuro coordination normal deep tendon reflexes equal sensation intact   Psych alert and oriented x3, no evidence of depression  anxiety or agitation  Musculoskeletal gait remains normal  Skin left knee normal inspection reveals medial joint line tenderness some patellofemoral tenderness and crepitation no evidence of large effusion range of motion arc is 125 degrees knee is stable meniscal signs are positive for medial meniscal tear muscle tone and strength are normal without tremor  Upper extremities are normal   Assessment/Plan CLINICAL DATA:  Left knee pain status post fall several weeks ago   EXAM: MRI OF THE LEFT KNEE WITHOUT CONTRAST   TECHNIQUE: Multiplanar, multisequence MR imaging of the knee was performed. No intravenous contrast was administered.   COMPARISON:  None Available.   FINDINGS: MENISCI   Medial: Complex tear of the posterior horn of the medial meniscus. Blunting of the free edge of the body of the medial meniscus likely reflecting a small radial tear.   Lateral: Intact.   LIGAMENTS   Cruciates: ACL and PCL are intact.   Collaterals: Medial collateral ligament is intact. Lateral collateral ligament complex is intact.   CARTILAGE   Patellofemoral: Partial-thickness cartilage loss of the medial patellofemoral compartment.   Medial: High-grade partial-thickness cartilage loss of the medial femorotibial compartment and areas of full-thickness cartilage loss of the weight-bearing surface of the medial femoral condyle with small marginal osteophytes.   Lateral:  No chondral defect.   JOINT: No joint effusion. Normal Hoffa's fat-pad. No plical thickening.   POPLITEAL FOSSA: Popliteus tendon is intact. Small Baker's cyst.   EXTENSOR MECHANISM: Intact quadriceps tendon. Intact patellar tendon. Intact lateral patellar retinaculum. Intact medial patellar retinaculum. Intact MPFL.   BONES: No aggressive osseous lesion. No fracture or dislocation.   Other: No fluid collection or hematoma. Muscles are normal.   IMPRESSION: 1. Complex tear of the posterior horn of the  medial meniscus. Blunting of the free edge of the body of the medial meniscus likely reflecting a small radial tear. 2. Partial-thickness cartilage loss of the medial patellofemoral compartment. 3. High-grade partial-thickness cartilage loss of the medial femorotibial compartment and areas of full-thickness cartilage loss of the weight-bearing surface of the medial femoral condyle with small marginal osteophytes.     Electronically Signed   By: Elige Ko M.D.   On: 02/24/2022 07:26  Torn medial meniscus left knee with partial to high-grade partial-thickness cartilage loss patellofemoral joint and medial compartment  With her age of 32 and her symptoms total knee was also entertained as a possibility for treatment but the patient opted for arthroscopic surgery  Arthroscopy left knee partial medial meniscectomy  Fuller Canada, MD 04/08/2022, 12:58 PM

## 2022-04-09 ENCOUNTER — Encounter: Payer: Self-pay | Admitting: Orthopedic Surgery

## 2022-04-09 ENCOUNTER — Encounter (HOSPITAL_COMMUNITY)
Admission: RE | Disposition: A | Discharge status: Home / Self Care | Payer: Self-pay | Source: Home / Self Care | Attending: Orthopedic Surgery

## 2022-04-09 ENCOUNTER — Ambulatory Visit (HOSPITAL_COMMUNITY): Payer: 59

## 2022-04-09 ENCOUNTER — Ambulatory Visit (HOSPITAL_BASED_OUTPATIENT_CLINIC_OR_DEPARTMENT_OTHER): Payer: 59

## 2022-04-09 ENCOUNTER — Ambulatory Visit (HOSPITAL_COMMUNITY)
Admission: RE | Admit: 2022-04-09 | Discharge: 2022-04-09 | Disposition: A | Discharge status: Home / Self Care | Payer: 59 | Attending: Orthopedic Surgery | Admitting: Orthopedic Surgery

## 2022-04-09 ENCOUNTER — Other Ambulatory Visit: Payer: Self-pay

## 2022-04-09 ENCOUNTER — Encounter (HOSPITAL_COMMUNITY): Payer: Self-pay | Admitting: Orthopedic Surgery

## 2022-04-09 DIAGNOSIS — E119 Type 2 diabetes mellitus without complications: Secondary | ICD-10-CM | POA: Insufficient documentation

## 2022-04-09 DIAGNOSIS — M94262 Chondromalacia, left knee: Secondary | ICD-10-CM | POA: Insufficient documentation

## 2022-04-09 DIAGNOSIS — S83282A Other tear of lateral meniscus, current injury, left knee, initial encounter: Secondary | ICD-10-CM

## 2022-04-09 DIAGNOSIS — G4730 Sleep apnea, unspecified: Secondary | ICD-10-CM | POA: Diagnosis not present

## 2022-04-09 DIAGNOSIS — S83282D Other tear of lateral meniscus, current injury, left knee, subsequent encounter: Secondary | ICD-10-CM

## 2022-04-09 DIAGNOSIS — S83242D Other tear of medial meniscus, current injury, left knee, subsequent encounter: Secondary | ICD-10-CM

## 2022-04-09 DIAGNOSIS — Y92096 Garden or yard of other non-institutional residence as the place of occurrence of the external cause: Secondary | ICD-10-CM | POA: Insufficient documentation

## 2022-04-09 DIAGNOSIS — S83242A Other tear of medial meniscus, current injury, left knee, initial encounter: Secondary | ICD-10-CM

## 2022-04-09 DIAGNOSIS — X58XXXA Exposure to other specified factors, initial encounter: Secondary | ICD-10-CM | POA: Diagnosis not present

## 2022-04-09 DIAGNOSIS — S83232A Complex tear of medial meniscus, current injury, left knee, initial encounter: Secondary | ICD-10-CM | POA: Diagnosis present

## 2022-04-09 DIAGNOSIS — Z87891 Personal history of nicotine dependence: Secondary | ICD-10-CM | POA: Diagnosis not present

## 2022-04-09 HISTORY — PX: KNEE ARTHROSCOPY WITH MEDIAL MENISECTOMY: SHX5651

## 2022-04-09 HISTORY — PX: KNEE ARTHROSCOPY WITH MENISCAL REPAIR: SHX5653

## 2022-04-09 LAB — GLUCOSE, CAPILLARY
Glucose-Capillary: 146 mg/dL — ABNORMAL HIGH (ref 70–99)
Glucose-Capillary: 171 mg/dL — ABNORMAL HIGH (ref 70–99)

## 2022-04-09 SURGERY — ARTHROSCOPY, KNEE, WITH MEDIAL MENISCECTOMY
Anesthesia: General | Site: Knee | Laterality: Left

## 2022-04-09 MED ORDER — ORAL CARE MOUTH RINSE: 15.0000 mL | Freq: Once | OROMUCOSAL | Status: AC

## 2022-04-09 MED ORDER — EPHEDRINE 5 MG/ML INJ
INTRAVENOUS | Status: AC
  Filled 2022-04-09: qty 10

## 2022-04-09 MED ORDER — CHLORHEXIDINE GLUCONATE 0.12 % MT SOLN: 15.0000 mL | Freq: Once | OROMUCOSAL | Status: AC

## 2022-04-09 MED ORDER — LACTATED RINGERS IV SOLN: INTRAVENOUS | Status: DC

## 2022-04-09 MED ORDER — ONDANSETRON HCL 4 MG/2ML IJ SOLN
INTRAMUSCULAR | Status: AC
  Filled 2022-04-09: qty 2

## 2022-04-09 MED ORDER — LIDOCAINE HCL (PF) 2 % IJ SOLN
INTRAMUSCULAR | Status: AC
  Filled 2022-04-09: qty 5

## 2022-04-09 MED ORDER — BUPIVACAINE-EPINEPHRINE (PF) 0.5% -1:200000 IJ SOLN
INTRAMUSCULAR | Status: AC
  Filled 2022-04-09: qty 60

## 2022-04-09 MED ORDER — DEXAMETHASONE SODIUM PHOSPHATE 10 MG/ML IJ SOLN
INTRAMUSCULAR | Status: AC
  Filled 2022-04-09: qty 1

## 2022-04-09 MED ORDER — VANCOMYCIN HCL IN DEXTROSE 1-5 GM/200ML-% IV SOLN
1000.0000 mg | INTRAVENOUS | Status: AC
  Administered 2022-04-09: 1000 mg via INTRAVENOUS

## 2022-04-09 MED ORDER — DEXAMETHASONE SODIUM PHOSPHATE 10 MG/ML IJ SOLN
INTRAMUSCULAR | Status: DC | PRN
  Administered 2022-04-09: 10 mg via INTRAVENOUS

## 2022-04-09 MED ORDER — PROPOFOL 10 MG/ML IV BOLUS
INTRAVENOUS | Status: AC
  Filled 2022-04-09: qty 20

## 2022-04-09 MED ORDER — LIDOCAINE HCL (CARDIAC) PF 100 MG/5ML IV SOSY
PREFILLED_SYRINGE | INTRAVENOUS | Status: DC | PRN
  Administered 2022-04-09: 80 mg via INTRAVENOUS

## 2022-04-09 MED ORDER — EPINEPHRINE PF 1 MG/ML IJ SOLN
INTRAMUSCULAR | Status: AC
  Filled 2022-04-09: qty 3

## 2022-04-09 MED ORDER — BUPIVACAINE-EPINEPHRINE (PF) 0.5% -1:200000 IJ SOLN
INTRAMUSCULAR | Status: DC | PRN
  Administered 2022-04-09: 60 mL via PERINEURAL

## 2022-04-09 MED ORDER — ACETAMINOPHEN 500 MG PO TABS
500.0000 mg | ORAL_TABLET | Freq: Once | ORAL | Status: AC
  Administered 2022-04-09: 500 mg via ORAL
  Filled 2022-04-09: qty 1

## 2022-04-09 MED ORDER — FENTANYL CITRATE PF 50 MCG/ML IJ SOSY: 25.0000 ug | PREFILLED_SYRINGE | INTRAMUSCULAR | Status: DC | PRN

## 2022-04-09 MED ORDER — EPHEDRINE 5 MG/ML INJ
INTRAVENOUS | Status: AC
  Filled 2022-04-09: qty 5

## 2022-04-09 MED ORDER — CHLORHEXIDINE GLUCONATE 0.12 % MT SOLN
OROMUCOSAL | Status: AC
  Administered 2022-04-09: 15 mL via OROMUCOSAL
  Filled 2022-04-09: qty 15

## 2022-04-09 MED ORDER — FENTANYL CITRATE (PF) 100 MCG/2ML IJ SOLN
INTRAMUSCULAR | Status: AC
  Filled 2022-04-09: qty 2

## 2022-04-09 MED ORDER — FENTANYL CITRATE (PF) 100 MCG/2ML IJ SOLN
INTRAMUSCULAR | Status: DC | PRN
  Administered 2022-04-09 (×2): 25 ug via INTRAVENOUS

## 2022-04-09 MED ORDER — SEVOFLURANE IN SOLN
RESPIRATORY_TRACT | Status: AC
  Filled 2022-04-09: qty 250

## 2022-04-09 MED ORDER — ONDANSETRON HCL 4 MG/2ML IJ SOLN: 4.0000 mg | Freq: Once | INTRAMUSCULAR | Status: DC | PRN

## 2022-04-09 MED ORDER — PROPOFOL 10 MG/ML IV BOLUS
INTRAVENOUS | Status: DC | PRN
  Administered 2022-04-09: 200 mg via INTRAVENOUS

## 2022-04-09 MED ORDER — EPHEDRINE SULFATE (PRESSORS) 50 MG/ML IJ SOLN
INTRAMUSCULAR | Status: DC | PRN
  Administered 2022-04-09 (×2): 5 mg via INTRAVENOUS

## 2022-04-09 MED ORDER — VANCOMYCIN HCL IN DEXTROSE 1-5 GM/200ML-% IV SOLN
INTRAVENOUS | Status: AC
  Filled 2022-04-09: qty 200

## 2022-04-09 MED ORDER — ONDANSETRON HCL 4 MG/2ML IJ SOLN
INTRAMUSCULAR | Status: DC | PRN
  Administered 2022-04-09: 4 mg via INTRAVENOUS

## 2022-04-09 MED ORDER — HYDROMORPHONE HCL 4 MG PO TABS: 4.0000 mg | ORAL_TABLET | ORAL | 0 refills | Status: AC | PRN

## 2022-04-09 MED ORDER — SODIUM CHLORIDE 0.9 % IR SOLN
Status: DC | PRN
  Administered 2022-04-09: 3000 mL

## 2022-04-09 SURGICAL SUPPLY — 52 items
ABLATOR ASPIRATE 50D MULTI-PRT (SURGICAL WAND) IMPLANT
ANCH SUT 2-0 CVD FBRSTCH PLSTR (Anchor) ×1 IMPLANT
APL PRP STRL LF DISP 70% ISPRP (MISCELLANEOUS) ×1
BAG HAMPER (MISCELLANEOUS) ×2 IMPLANT
BLADE SHAVER TORPEDO 4X13 (MISCELLANEOUS) ×1 IMPLANT
BLADE SURG SZ11 CARB STEEL (BLADE) ×2 IMPLANT
BNDG CMPR STD VLCR NS LF 5.8X6 (GAUZE/BANDAGES/DRESSINGS) ×1
BNDG ELASTIC 6X5.8 VLCR NS LF (GAUZE/BANDAGES/DRESSINGS) ×2 IMPLANT
CHLORAPREP W/TINT 26 (MISCELLANEOUS) ×2 IMPLANT
CLOTH BEACON ORANGE TIMEOUT ST (SAFETY) ×2 IMPLANT
COOLER ICEMAN CLASSIC (MISCELLANEOUS) ×2 IMPLANT
CUTTER TENSIONER SUT 2-0 0 FBW (INSTRUMENTS) ×1 IMPLANT
DECANTER SPIKE VIAL GLASS SM (MISCELLANEOUS) ×4 IMPLANT
DRAPE HALF SHEET 40X57 (DRAPES) ×2 IMPLANT
GAUZE SPONGE 4X4 12PLY STRL (GAUZE/BANDAGES/DRESSINGS) ×2 IMPLANT
GAUZE SPONGE 4X4 12PLY STRL LF (GAUZE/BANDAGES/DRESSINGS) ×1 IMPLANT
GAUZE XEROFORM 5X9 LF (GAUZE/BANDAGES/DRESSINGS) ×2 IMPLANT
GLOVE BIOGEL PI IND STRL 7.0 (GLOVE) ×2 IMPLANT
GLOVE BIOGEL PI INDICATOR 7.0 (GLOVE) ×2
GLOVE SS N UNI LF 8.5 STRL (GLOVE) ×2 IMPLANT
GLOVE SURG POLYISO LF SZ8 (GLOVE) ×2 IMPLANT
GOWN STRL REUS W/TWL LRG LVL3 (GOWN DISPOSABLE) ×2 IMPLANT
GOWN STRL REUS W/TWL XL LVL3 (GOWN DISPOSABLE) ×2 IMPLANT
IMPL FIBERSTICH 2-0 CVD (Anchor) IMPLANT
IMPLANT FIBERSTICH 2-0 CVD (Anchor) ×2 IMPLANT
IV NS IRRIG 3000ML ARTHROMATIC (IV SOLUTION) ×4 IMPLANT
KIT BLADEGUARD II DBL (SET/KITS/TRAYS/PACK) ×2 IMPLANT
KIT TURNOVER CYSTO (KITS) ×2 IMPLANT
MANIFOLD NEPTUNE II (INSTRUMENTS) ×2 IMPLANT
MARKER SKIN DUAL TIP RULER LAB (MISCELLANEOUS) ×2 IMPLANT
NDL HYPO 18GX1.5 BLUNT FILL (NEEDLE) ×1 IMPLANT
NDL HYPO 21X1.5 SAFETY (NEEDLE) ×1 IMPLANT
NDL SPNL 18GX3.5 QUINCKE PK (NEEDLE) ×1 IMPLANT
NEEDLE HYPO 18GX1.5 BLUNT FILL (NEEDLE) ×2 IMPLANT
NEEDLE HYPO 21X1.5 SAFETY (NEEDLE) ×2 IMPLANT
NEEDLE SPNL 18GX3.5 QUINCKE PK (NEEDLE) ×2 IMPLANT
NS IRRIG 1000ML POUR BTL (IV SOLUTION) ×2 IMPLANT
PACK ARTHRO LIMB DRAPE STRL (MISCELLANEOUS) ×2 IMPLANT
PACK ARTHROSCOPY WL (CUSTOM PROCEDURE TRAY) ×2 IMPLANT
PAD ABD 5X9 TENDERSORB (GAUZE/BANDAGES/DRESSINGS) ×2 IMPLANT
PAD ARMBOARD 7.5X6 YLW CONV (MISCELLANEOUS) ×2 IMPLANT
PAD COLD SHLDR SM WRAP-ON (PAD) ×1 IMPLANT
PAD FOR LEG HOLDER (MISCELLANEOUS) ×2 IMPLANT
PADDING CAST COTTON 6X4 STRL (CAST SUPPLIES) ×2 IMPLANT
PADDING WEBRIL 6 STERILE (GAUZE/BANDAGES/DRESSINGS) ×1 IMPLANT
PORT APPOLLO RF 90DEGREE MULTI (SURGICAL WAND) IMPLANT
SET ARTHROSCOPY INST (INSTRUMENTS) ×2 IMPLANT
SET BASIN LINEN APH (SET/KITS/TRAYS/PACK) ×2 IMPLANT
SUT ETHILON 3 0 FSL (SUTURE) ×2 IMPLANT
SYR 30ML LL (SYRINGE) ×4 IMPLANT
TUBE CONNECTING 12X1/4 (SUCTIONS) ×4 IMPLANT
TUBING IN/OUT FLOW W/MAIN PUMP (TUBING) ×2 IMPLANT

## 2022-04-09 NOTE — Anesthesia Procedure Notes (Addendum)
Procedure Name: LMA Insertion Date/Time: 04/09/2022 7:32 AM  Performed by: Charlestine Massed, RNPre-anesthesia Checklist: Patient identified, Emergency Drugs available, Suction available and Patient being monitored Patient Re-evaluated:Patient Re-evaluated prior to induction Oxygen Delivery Method: Circle system utilized Preoxygenation: Pre-oxygenation with 100% oxygen Induction Type: IV induction LMA: LMA inserted LMA Size: 4.0 Number of attempts: 1 Placement Confirmation: positive ETCO2 Tube secured with: Tape Dental Injury: Teeth and Oropharynx as per pre-operative assessment

## 2022-04-09 NOTE — Brief Op Note (Signed)
04/09/2022  8:25 AM  PATIENT:  Monique Warren  70 y.o. female  PRE-OPERATIVE DIAGNOSIS:  Medial meniscus tear left knee  POST-OPERATIVE DIAGNOSIS:  Medial meniscus tear left knee, lateral meniscus tear left knee   PROCEDURE:  Procedure(s): KNEE ARTHROSCOPY WITH MEDIAL MENISCAL REPAIR 2.   KNEE ARTHROSCOPY WITH PARTIAL MEDIAL MENISCECTOMY,  3.   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  I updated the chart reviewed the history and physical and images  She was taken to surgery for general anesthesia  She was in the supine position.  After successful general anesthesia the left leg was placed in an arthroscopic leg holder right leg was padded and flexed  Sterile prep and drape was performed timeout was completed  Scope was placed through the lateral portal diagnostic arthroscopy was performed  Medial portal was established with a spinal needle.  Diagnostic arthroscopy was repeated with the probe  The medial meniscus was torn at the body was a parrot-beak tear this was resected with a straight motorized torpedo shaver  Further evaluation of the medial meniscus revealed that it had a superior surface tear at the posterior horn at the red-white junction it was incomplete and I decided to repair  First  however I debrided the free edge tear of the anterior horn of the lateral meniscus with a shaver  I then used the Arthrex suture device and placed 1 suture horizontal mattress successfully to repair the posterior horn tear to save the meniscus  The joint was irrigated thoroughly it was injected with 60 cc of Marcaine with epinephrine  3 sutures were placed to in the medial portal 1 in the lateral portal  Sterile dressing and Cryo/Cuff were applied  Patient was extubated taken to recovery room in stable condition  As far as the postop course limit range of motion 0 to 60 degrees for 6 weeks but otherwise she will be full weightbearing as tolerated  No bracing needed  Patient will use a walker for the first 4 weeks   SURGEON:  Surgeon(s) and Role:    * Margues Filippini E, MD - Primary  PHYSICIAN ASSISTANT:   ASSISTANTS: none   ANESTHESIA:   general  EBL:  30 mL   BLOOD ADMINISTERED:none  DRAINS: none   LOCAL MEDICATIONS USED:  MARCAINE     SPECIMEN:  No Specimen  DISPOSITION OF SPECIMEN:  N/A  COUNTS:  YES  TOURNIQUET:  * No tourniquets in log *  DICTATION: .Dragon Dictation  PLAN OF CARE: Discharge to home after PACU  PATIENT DISPOSITION:  PACU - hemodynamically stable.   Delay start of Pharmacological VTE agent (>24hrs) due to surgical blood loss or risk of bleeding: not applicable  

## 2022-04-09 NOTE — Transfer of Care (Signed)
Immediate Anesthesia Transfer of Care Note  Patient: Monique Warren  Procedure(s) Performed: KNEE ARTHROSCOPY WITH PARTIAL MEDIAL MENISCECTOMY, LATERAL MENISCAL DEBRIDEMENT (Left: Knee) KNEE ARTHROSCOPY WITH MEDIAL MENISCAL REPAIR (Left: Knee)  Patient Location: PACU  Anesthesia Type:General  Level of Consciousness: awake  Airway & Oxygen Therapy: Patient Spontanous Breathing and Patient connected to nasal cannula oxygen  Post-op Assessment: Report given to RN and Post -op Vital signs reviewed and stable  Post vital signs: Reviewed and stable  Last Vitals:  Vitals Value Taken Time  BP 132/72 04/09/22 0835  Temp 36.5 C 04/09/22 0835  Pulse 67 04/09/22 0835  Resp 17 04/09/22 0835  SpO2 91 % 04/09/22 0835  Vitals shown include unvalidated device data.  Last Pain:  Vitals:   04/09/22 0645  TempSrc: Oral  PainSc: 3          Complications: No notable events documented.

## 2022-04-09 NOTE — Interval H&P Note (Signed)
History and Physical Interval Note:  04/09/2022 7:18 AM  Monique Warren  has presented today for surgery, with the diagnosis of Medial meniscus tear left knee.  The various methods of treatment have been discussed with the patient and family. After consideration of risks, benefits and other options for treatment, the patient has consented to  Procedure(s): KNEE ARTHROSCOPY WITH MEDIAL MENISCECTOMY (Left) as a surgical intervention.  The patient's history has been reviewed, patient examined, no change in status, stable for surgery.  I have reviewed the patient's chart and labs.  Questions were answered to the patient's satisfaction.     Fuller Canada

## 2022-04-09 NOTE — Op Note (Signed)
04/09/2022  8:25 AM  PATIENT:  Monique Warren  71 y.o. female  PRE-OPERATIVE DIAGNOSIS:  Medial meniscus tear left knee  POST-OPERATIVE DIAGNOSIS:  Medial meniscus tear left knee, lateral meniscus tear left knee   PROCEDURE:  Procedure(s): KNEE ARTHROSCOPY WITH MEDIAL MENISCAL REPAIR 2.   KNEE ARTHROSCOPY WITH PARTIAL MEDIAL MENISCECTOMY,  3.   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  I updated the chart reviewed the history and physical and images  She was taken to surgery for general anesthesia  She was in the supine position.  After successful general anesthesia the left leg was placed in an arthroscopic leg holder right leg was padded and flexed  Sterile prep and drape was performed timeout was completed  Scope was placed through the lateral portal diagnostic arthroscopy was performed  Medial portal was established with a spinal needle.  Diagnostic arthroscopy was repeated with the probe  The medial meniscus was torn at the body was a parrot-beak tear this was resected with a straight motorized torpedo shaver  Further evaluation of the medial meniscus revealed that it had a superior surface tear at the posterior horn at the red-white junction it was incomplete and I decided to repair  First  however I debrided the free edge tear of the anterior horn of the lateral meniscus with a shaver  I then used the Arthrex suture device and placed 1 suture horizontal mattress successfully to repair the posterior horn tear to save the meniscus  The joint was irrigated thoroughly it was injected with 60 cc of Marcaine with epinephrine  3 sutures were placed to in the medial portal 1 in the lateral portal  Sterile dressing and Cryo/Cuff were applied  Patient was extubated taken to recovery room in stable condition  As far as the postop course limit range of motion 0 to 60 degrees for 6 weeks but otherwise she will be full weightbearing as tolerated  No bracing needed  Patient will use a walker for the first 4 weeks   SURGEON:  Surgeon(s) and Role:    * Vickki Hearing, MD - Primary  PHYSICIAN ASSISTANT:   ASSISTANTS: none   ANESTHESIA:   general  EBL:  30 mL   BLOOD ADMINISTERED:none  DRAINS: none   LOCAL MEDICATIONS USED:  MARCAINE     SPECIMEN:  No Specimen  DISPOSITION OF SPECIMEN:  N/A  COUNTS:  YES  TOURNIQUET:  * No tourniquets in log *  DICTATION: .Dragon Dictation  PLAN OF CARE: Discharge to home after PACU  PATIENT DISPOSITION:  PACU - hemodynamically stable.   Delay start of Pharmacological VTE agent (>24hrs) due to surgical blood loss or risk of bleeding: not applicable

## 2022-04-09 NOTE — Anesthesia Preprocedure Evaluation (Addendum)
Anesthesia Evaluation  Patient identified by MRN, date of birth, ID band Patient awake    Reviewed: Allergy & Precautions, H&P , NPO status , Patient's Chart, lab work & pertinent test results, reviewed documented beta blocker date and time   Airway Mallampati: II  TM Distance: >3 FB Neck ROM: full    Dental no notable dental hx.    Pulmonary sleep apnea , former smoker,    Pulmonary exam normal breath sounds clear to auscultation       Cardiovascular Exercise Tolerance: Good hypertension, negative cardio ROS   Rhythm:regular Rate:Normal     Neuro/Psych  Headaches, PSYCHIATRIC DISORDERS Anxiety Depression  Neuromuscular disease    GI/Hepatic Neg liver ROS, hiatal hernia, GERD  Medicated,  Endo/Other  negative endocrine ROSdiabetes  Renal/GU negative Renal ROS  negative genitourinary   Musculoskeletal   Abdominal   Peds  Hematology negative hematology ROS (+)   Anesthesia Other Findings 1. Left ventricular ejection fraction, by estimation, is 70 to 75%. The  left ventricle has hyperdynamic function. The left ventricle has no  regional wall motion abnormalities. There is moderate concentric left  ventricular hypertrophy. Left ventricular  diastolic parameters are consistent with Grade I diastolic dysfunction  (impaired relaxation). Elevated left ventricular end-diastolic pressure.  There is a mildly increased LVOT gradient that is stable with Valsalva,  also some degree of mitral chordal SAM  based on limited views.  2. Right ventricular systolic function is normal. The right ventricular  size is normal. Tricuspid regurgitation signal is inadequate for assessing  PA pressure.  3. The mitral valve is grossly normal. Trivial mitral valve  regurgitation.  4. The aortic valve was not well visualized. There is mild calcification  of the aortic valve. Aortic valve regurgitation is not visualized. Aortic  valve  mean gradient measures 10.0 mmHg. Suspect LVOT gradient more a  contributor than any significant  degree of aortic stenosis.  5. Unable to estimate CVP.   Reproductive/Obstetrics negative OB ROS                             Anesthesia Physical Anesthesia Plan  ASA: 2  Anesthesia Plan: General and General LMA   Post-op Pain Management:    Induction:   PONV Risk Score and Plan: Ondansetron  Airway Management Planned:   Additional Equipment:   Intra-op Plan:   Post-operative Plan:   Informed Consent: I have reviewed the patients History and Physical, chart, labs and discussed the procedure including the risks, benefits and alternatives for the proposed anesthesia with the patient or authorized representative who has indicated his/her understanding and acceptance.     Dental Advisory Given  Plan Discussed with: CRNA  Anesthesia Plan Comments:        Anesthesia Quick Evaluation

## 2022-04-10 ENCOUNTER — Encounter (HOSPITAL_COMMUNITY): Payer: Self-pay | Admitting: Orthopedic Surgery

## 2022-04-10 NOTE — Anesthesia Postprocedure Evaluation (Signed)
Anesthesia Post Note  Patient: Monique Warren  Procedure(s) Performed: KNEE ARTHROSCOPY WITH PARTIAL MEDIAL MENISCECTOMY, LATERAL MENISCAL DEBRIDEMENT (Left: Knee) KNEE ARTHROSCOPY WITH MEDIAL MENISCAL REPAIR (Left: Knee)  Patient location during evaluation: Phase II Anesthesia Type: General Level of consciousness: awake Pain management: pain level controlled Vital Signs Assessment: post-procedure vital signs reviewed and stable Respiratory status: spontaneous breathing and respiratory function stable Cardiovascular status: blood pressure returned to baseline and stable Postop Assessment: no headache and no apparent nausea or vomiting Anesthetic complications: no Comments: Late entry   No notable events documented.   Last Vitals:  Vitals:   04/09/22 0915 04/09/22 0920  BP: 131/72 124/60  Pulse: 66 67  Resp: 15 15  Temp:  36.6 C  SpO2: 94% 99%    Last Pain:  Vitals:   04/09/22 0920  TempSrc: Oral  PainSc: 0-No pain                 Windell Norfolk

## 2022-04-15 DIAGNOSIS — Z9889 Other specified postprocedural states: Secondary | ICD-10-CM | POA: Insufficient documentation

## 2022-04-17 ENCOUNTER — Ambulatory Visit (INDEPENDENT_AMBULATORY_CARE_PROVIDER_SITE_OTHER): Payer: 59 | Admitting: Orthopedic Surgery

## 2022-04-17 ENCOUNTER — Encounter: Payer: Self-pay | Admitting: Orthopedic Surgery

## 2022-04-17 DIAGNOSIS — Z9889 Other specified postprocedural states: Secondary | ICD-10-CM

## 2022-04-17 NOTE — Progress Notes (Signed)
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

## 2022-04-17 NOTE — Patient Instructions (Addendum)
Limit flexion to 90 degrees  Ice 3 x a day

## 2022-05-08 ENCOUNTER — Ambulatory Visit (INDEPENDENT_AMBULATORY_CARE_PROVIDER_SITE_OTHER): Payer: 59 | Admitting: Orthopedic Surgery

## 2022-05-08 ENCOUNTER — Encounter: Payer: Self-pay | Admitting: Orthopedic Surgery

## 2022-05-08 DIAGNOSIS — Z9889 Other specified postprocedural states: Secondary | ICD-10-CM

## 2022-05-08 NOTE — Progress Notes (Signed)
Chief Complaint  Patient presents with   Post-op Follow-up    04/09/22 left knee scope    Second postop visit for Monique Warren.  She had a partial medial and lateral meniscectomy and a meniscal repair of the posterior horn she is doing well she limited her flexion to 90 degrees and actually passively has about 100 degrees of knee flexion full extension  She does complain of some soreness she still using her walker  I think it is okay now to advance her range of motion with physical therapy as a formal outpatient visit continue with quadricep strengthening transition to a cane and follow-up in 4 weeks she can continue Tylenol which is controlling her pain

## 2022-05-09 ENCOUNTER — Other Ambulatory Visit: Payer: Self-pay | Admitting: Internal Medicine

## 2022-05-09 ENCOUNTER — Ambulatory Visit: Payer: 59 | Admitting: Nurse Practitioner

## 2022-05-09 DIAGNOSIS — G43019 Migraine without aura, intractable, without status migrainosus: Secondary | ICD-10-CM

## 2022-05-09 NOTE — Progress Notes (Deleted)
Referring Provider: Lindell Spar, MD Primary Care Physician:  Lindell Spar, MD Primary GI Physician: Dr. Abbey Chatters  No chief complaint on file.   HPI:   Monique Warren is a 71 y.o. female presenting today with a history of of type 2 diabetes, HTN, HLD, OSA, grade 1 diastolic dysfunction with preserved EF, possible celiac disease though esophageal biopsies non-specific in 2019, Barrett's esophagus, GERD, constipation, adenomatous colon polyps, fatty liver, elevated liver enzymes, presenting today or follow-up.  Last seen in our office 01/16/22. Reflux well controlled on Aciphex BID and famotidine daily. Reported 6 months of dysphagia. Started Ozempic 2 months ago and notice nausea and a little reflux if over eating.  Reported she was following a gluten-free diet since her EGD in 2019 with duodenal biopsies that showed increased intraepithelial lymphocytes without blunting of villi that was relatively nonspecific but could have been associated with celiac disease.  Reported previously having abdominal pain and more severe reflux that improved with following a gluten-free diet.  Also with chronic constipation, not adequately managed with milk of magnesia, previously failing MiraLAX.  Associated abdominal discomfort if skipping several days.  Also discussed fatty liver and slightly elevated alk phos and ALT. Plan included EGD, try Trulance 3 mg daily check hepatitis A and B serologies, screen for hemochromatosis, update HFP, counseled on fatty liver, avoid alcohol, continue Aciphex and famotidine.  No progress report received on Trulance.  Labs with AST 50, ALT 64, ferritin within normal limits, hemochromatosis DNA negative, hep B serologies negative without immunity, immune to hep A.  Recommended continuing to follow with recommendations provided at office visit, and repeat LFTs at next visit.  EGD 02/08/22: 2 cm hiatal hernia, short segments barretts biopsied, mild Schatzki ring dilated,  gastritis. Biopsied, normal examined duodenum.  Gastric biopsy was benign, esophageal biopsy consistent with Barrett's esophagus, negative for dysplasia.  Recommended repeat EGD in 5 years.    Today:   Dysphagia:   GERD:   Constipation:   Fatty liver/elevated LFTs:   Update HFP, ultrasound  Past Medical History:  Diagnosis Date   Anxiety    Arthritis    Asthma    Celiac disease    Depression    Diabetes mellitus, type II ([place])    Diabetic neuropathy (HCC)    Diastolic dysfunction    Grade 1 with preserved EF   Dysrhythmia    Heart murmur    Hiatal hernia    Sliding   Hyperlipidemia    Hypertension    Migraine    Obstructive sleep apnea    Osteoporosis    Vertigo     Past Surgical History:  Procedure Laterality Date   ABDOMINAL HYSTERECTOMY  09/29/2003   APPENDECTOMY     BALLOON DILATION N/A 02/08/2022   Procedure: BALLOON DILATION;  Surgeon: Eloise Harman, DO;  Location: [facility];  Service: Endoscopy;  Laterality: N/A;   BIOPSY  02/08/2022   Procedure: BIOPSY;  Surgeon: Eloise Harman, DO;  Location: [facility];  Service: Endoscopy;;   BREAST BIOPSY Left    CARPAL TUNNEL RELEASE     CATARACT EXTRACTION Bilateral    October and November 2016   COLONOSCOPY  09/2017   [name], MD in [place]; 5 mm tubular adenoma in the descending colon s/p resected, mild sigmoid diverticulosis, internal hemorrhoids.  Recommended repeat colonoscopy in 5 years.   CRANIOTOMY FOR ANEURYSM / VERTEBROBASILAR / CAROTID CIRCULATION Right 10/24/2015   ESOPHAGOGASTRODUODENOSCOPY  09/2017  [place]; irregular Z-line s/p biopsy, decreased LES tone, 4 cm hiatal hernia, erythema in gastric antrum biopsied, normal examined duodenum biopsied.  Esophageal biopsy suggestive of reflux, no Barrett's, gastric biopsy with nonspecific chronic gastritis with intestinal metaplasia, duodenal biopsy with increased intraepithelial lymphocytes without blunting of villi, nonspecific.    ESOPHAGOGASTRODUODENOSCOPY (EGD) WITH PROPOFOL N/A 02/08/2022   Procedure: ESOPHAGOGASTRODUODENOSCOPY (EGD) WITH PROPOFOL;  Surgeon: Eloise Harman, DO;  Location: [facility];  Service: Endoscopy;  Laterality: N/A;  12:00pm   KNEE ARTHROSCOPY WITH MEDIAL MENISECTOMY Left 04/09/2022   Procedure: KNEE ARTHROSCOPY WITH PARTIAL MEDIAL MENISCECTOMY, LATERAL MENISCAL DEBRIDEMENT;  Surgeon: Carole Civil, MD;  Location: [facility];  Service: Orthopedics;  Laterality: Left;   KNEE ARTHROSCOPY WITH MENISCAL REPAIR Left 04/09/2022   Procedure: KNEE ARTHROSCOPY WITH MEDIAL MENISCAL REPAIR;  Surgeon: Carole Civil, MD;  Location: [facility];  Service: Orthopedics;  Laterality: Left;   LAPAROSCOPY     UTERINE FIBROID SURGERY      Current Outpatient Medications  Medication Sig Dispense Refill   acetaminophen (TYLENOL) 500 MG tablet Take 1,000 mg by mouth every 6 (six) hours as needed for moderate pain.     AIMOVIG 140 MG/ML SOAJ INJECT 140 MG UNDER THE SKIN EVERY 30 DAYS 1 mL 11   albuterol (VENTOLIN HFA) 108 (90 Base) MCG/ACT inhaler USE 2 INHALATIONS EVERY 6 HOURS AS NEEDED FOR WHEEZING OR SHORTNESS OF BREATH 8.5 g 10   atorvastatin (LIPITOR) 40 MG tablet Take 1 tablet (40 mg total) by mouth at bedtime. 90 tablet 3   buPROPion (WELLBUTRIN XL) 300 MG 24 hr tablet TAKE 1 TABLET DAILY 90 tablet 3   diltiazem (CARDIZEM SR) 120 MG 12 hr capsule Take 2 capsules (240 mg total) by mouth daily. 180 capsule 2   DULoxetine (CYMBALTA) 60 MG capsule Take 1 capsule (60 mg total) by mouth daily. (Patient taking differently: Take 60 mg by mouth at bedtime.) 90 capsule 1   Estradiol 10 MCG TABS vaginal tablet Place 10 mcg vaginally every Monday.     famotidine (PEPCID) 40 MG tablet TAKE 1 TABLET DAILY (Patient taking differently: Take 40 mg by mouth at bedtime.) 90 tablet 3   Fluticasone-Salmeterol,sensor, (AIRDUO DIGIHALER) 232-14 MCG/ACT AEPB Inhale 1 Inhalation into the lungs 2 (two) times daily. 1 each 5    Lancets (ONETOUCH DELICA PLUS [id number]) MISC Use to check glucose once daily 100 each 6   lisinopril (ZESTRIL) 10 MG tablet Take 10 mg by mouth 2 (two) times daily.     metoprolol succinate (TOPROL-XL) 50 MG 24 hr tablet Take 0.5 tablets (25 mg total) by mouth daily for 14 days, THEN 1 tablet (50 mg total) daily. Take with or immediately following a meal.. (Patient taking differently: Take 1 tablet (50 mg total) daily. Take with or immediately following a meal.) 90 tablet 3   Multiple Vitamin (MULTIVITAMIN WITH MINERALS) TABS tablet Take 1 tablet by mouth daily.     ONETOUCH ULTRA test strip Use to monitor glucose once daily 100 each 6   OZEMPIC, 1 MG/DOSE, 4 MG/3ML SOPN INJECT 1 MG ONCE A WEEK AS DIRECTED 6 mL 1   PROLIA 60 MG/ML SOSY injection Inject 60 mg into the skin every 6 (six) months. 1 mL 1   zolmitriptan (ZOMIG-ZMT) 5 MG disintegrating tablet Take 5 mg by mouth as needed for migraine.     No current facility-administered medications for this visit.    Allergies as of 05/13/2022 - Review Complete 05/08/2022  Allergen Reaction  Noted   Cefuroxime Anaphylaxis, Hives, Shortness Of Breath, Swelling, and Anxiety 09/16/2006   Codeine Hives, Shortness Of Breath, Nausea And Vomiting, Swelling, and Anxiety 09/16/2006   Sulfa antibiotics Anaphylaxis, Hives, Nausea And Vomiting, and Swelling 09/02/2017   Gluten meal  02/05/2022    Family History  Problem Relation Age of Onset   Breast cancer Mother    Thyroid disease Mother    Stroke Mother    Hypertension Father    Hyperlipidemia Father    Heart attack Father    Heart failure Father    Breast cancer Paternal Grandmother    Cancer - Colon Neg Hx    Gastric cancer Neg Hx    Esophageal cancer Neg Hx     Social History   Socioeconomic History   Marital status: Married    Spouse name: Not on file   Number of children: Not on file   Years of education: Not on file   Highest education level: Not on file  Occupational History    Not on file  Tobacco Use   Smoking status: Former    Packs/day: 0.50    Types: Cigarettes   Smokeless tobacco: Never  Vaping Use   Vaping Use: Never used  Substance and Sexual Activity   Alcohol use: Yes    Alcohol/week: 4.0 standard drinks of alcohol    Types: 2 Glasses of wine, 2 Shots of liquor per week    Comment: on the weekends   Drug use: Never   Sexual activity: Not on file  Other Topics Concern   Not on file  Social History Narrative   Not on file   Social Determinants of Health   Financial Resource Strain: Not on file  Food Insecurity: Not on file  Transportation Needs: Not on file  Physical Activity: Not on file  Stress: Not on file  Social Connections: Not on file    Review of Systems: Gen: Denies fever, chills, cold or flu like symptoms, pre-syncope, or syncope. CV: Denies chest pain, palpitations. Resp: Denies dyspnea, cough.  GI: See HPI Heme: See HPI  Physical Exam: There were no vitals taken for this visit. General:   Alert and oriented. No distress noted. Pleasant and cooperative.  Head:  Normocephalic and atraumatic. Eyes:  Conjuctiva clear without scleral icterus. Heart:  S1, S2 present without murmurs appreciated. Lungs:  Clear to auscultation bilaterally. No wheezes, rales, or rhonchi. No distress.  Abdomen:  +BS, soft, non-tender and non-distended. No rebound or guarding. No HSM or masses noted. Msk:  Symmetrical without gross deformities. Normal posture. Extremities:  Without edema. Neurologic:  Alert and  oriented x4 Psych:  Normal mood and affect.    Assessment:  71 year old female with history of type 2 diabetes, HTN, HLD, OSA, grade 1 diastolic dysfunction with preserved EF, possible celiac disease though esophageal biopsies nonspecific in 2019 (following gluten-free diet since that time, normal appearing small bowel in 2023), Barrett's esophagus, GERD, constipation, adenomatous colon polyps, fatty liver, elevated liver enzymes,  presenting today for follow-up s/p EGD for dysphagia.  Dysphagia:  GERD:  Constipation:  Fatty liver/elevated LFTs:    Plan:  ***   [name], PA-C [facility] 05/13/2022

## 2022-05-13 ENCOUNTER — Ambulatory Visit: Payer: 59 | Admitting: Gastroenterology

## 2022-05-16 NOTE — Progress Notes (Signed)
Primary Care Physician:  Lindell Spar, MD  Primary GI: Dr. Abbey Chatters  Patient Location: Home   Provider Location: [facility] office   Reason for Visit: Follow-up   Persons present on the virtual encounter, with roles: [name], PA-C (Provider), Monique Warren (patient)   Total time (minutes) spent on medical discussion: 18 minutes  Virtual Visit via video Note Due to COVID-19, visit is conducted virtually and was requested by patient.   I connected with Monique Warren on 05/17/22 at 10:30 AM EDT by video and verified that I am speaking with the correct person using two identifiers.   I discussed the limitations, risks, security and privacy concerns of performing an evaluation and management service by video and the availability of in person appointments. I also discussed with the patient that there may be a patient responsible charge related to this service. The patient expressed understanding and agreed to proceed.  Chief Complaint  Patient presents with   Follow-up    Throat and stomach burns      History of Present Illness: Monique Warren is a 71 y.o. female presenting today with a history of of type 2 diabetes, HTN, HLD, OSA, grade 1 diastolic dysfunction with preserved EF, possible celiac disease though esophageal biopsies non-specific in 2019, Barrett's esophagus, GERD, constipation, adenomatous colon polyps, fatty liver, elevated liver enzymes, presenting today or follow-up.   Last seen in our office 01/16/22. Reflux well controlled on Aciphex BID and famotidine daily. Reported 6 months of dysphagia. Started Ozempic 2 months ago and notice nausea and a little reflux if over eating.  Reported she was following a gluten-free diet since her EGD in 2019 with duodenal biopsies that showed increased intraepithelial lymphocytes without blunting of villi that was relatively nonspecific but could have been associated with celiac disease.  Reported previously having abdominal  pain and more severe reflux that improved with following a gluten-free diet.  Also with chronic constipation, not adequately managed with milk of magnesia, previously failing MiraLAX.  Associated abdominal discomfort if skipping several days.  Also discussed fatty liver and slightly elevated alk phos and ALT. Plan included EGD, try Trulance 3 mg daily check hepatitis A and B serologies, screen for hemochromatosis, update HFP, counseled on fatty liver, avoid alcohol, continue Aciphex and famotidine.   No progress report received on Trulance.   Labs 01/31/22 with AST 50, ALT 64, ferritin within normal limits, hemochromatosis DNA negative, hep B serologies negative without immunity, immune to hep A.  Recommended continuing to follow with recommendations provided at office visit, and repeat LFTs at next visit.   EGD 02/08/22: 2 cm hiatal hernia, short segments barretts biopsied, mild Schatzki ring dilated, gastritis. Biopsied, normal examined duodenum.  Gastric biopsy was benign, esophageal biopsy consistent with Barrett's esophagus, negative for dysplasia.  Recommended repeat EGD in 5 years.      Today:    Dysphagia:  Some improvement. Seemed to get better right after dilation, but now back to how it was before. Trouble with foods and liquids. Occurs about 3 times a week. Started back 2 months.   GERD:  Burning in throat and stomach. Started 2 weeks ago. No medication changes. Worse at night. No dietary changes. Taking pepcid 40 mg at bedtime. Apparently she is not taking Aciphex and didn't realize it.   Constipation:  Trulance: Didn't help.  Having to take MOM once a week. Has a BM on these days.  Had 1 black stool last week after taking Pepto-Bismol for  reflux. No blood.  No significant abdominal pain.  Tried Linzess several years ago and it caused diarrhea, but constipation wasn't as bad.     Fatty liver/elevated LFTs: Has blood work with Dr. Posey Pronto in the next week.   Reports intermittent  abdominal distention related to constipation, lower extremity edema, yellowing of the eyes or skin, mental status changes/confusion.  Weight:  202 lbs 01/16/22 Reports she weights 175 lbs today.  Decreased portion sizes since starting Ozempic back in March or April.  Continues with gluten free diet.  No nausea or vomiting. Since reflux has been flared up, she is eating less due to burning in her esophagus.   Alcohol: Very rarely.  Illicit drug use: None.  OTC supplements/herbal teas: Chamomile tea.  Tylenol: 1000 mg per day.  Family history of liver disease/autoimmune conditions: None.   Past Medical History:  Diagnosis Date   Anxiety    Arthritis    Asthma    Celiac disease    Depression    Diabetes mellitus, type II ([place])    Diabetic neuropathy ([place])    Diastolic dysfunction    Grade 1 with preserved EF   Dysrhythmia    Heart murmur    Hiatal hernia    Sliding   Hyperlipidemia    Hypertension    Migraine    Obstructive sleep apnea    Osteoporosis    Vertigo      Past Surgical History:  Procedure Laterality Date   ABDOMINAL HYSTERECTOMY  09/29/2003   APPENDECTOMY     BALLOON DILATION N/A 02/08/2022   Procedure: BALLOON DILATION;  Surgeon: Eloise Harman, DO;  Location: [facility];  Service: Endoscopy;  Laterality: N/A;   BIOPSY  02/08/2022   Procedure: BIOPSY;  Surgeon: Eloise Harman, DO;  Location: [facility];  Service: Endoscopy;;   BREAST BIOPSY Left    CARPAL TUNNEL RELEASE     CATARACT EXTRACTION Bilateral    October and November 2016   COLONOSCOPY  09/2017   [name], MD in [place]; 5 mm tubular adenoma in the descending colon s/p resected, mild sigmoid diverticulosis, internal hemorrhoids.  Recommended repeat colonoscopy in 5 years.   CRANIOTOMY FOR ANEURYSM / VERTEBROBASILAR / CAROTID CIRCULATION Right 10/24/2015   ESOPHAGOGASTRODUODENOSCOPY  09/2017   [name]; irregular Z-line s/p biopsy, decreased LES tone, 4 cm hiatal hernia, erythema  in gastric antrum biopsied, normal examined duodenum biopsied.  Esophageal biopsy suggestive of reflux, no Barrett's, gastric biopsy with nonspecific chronic gastritis with intestinal metaplasia, duodenal biopsy with increased intraepithelial lymphocytes without blunting of villi, nonspecific.   ESOPHAGOGASTRODUODENOSCOPY (EGD) WITH PROPOFOL N/A 02/08/2022   Surgeon: Eloise Harman, DO; 2 cm hiatal hernia, short segment Barrett's esophagus without dysplasia, mild Schatzki's ring dilated, gastritis with biopsies benign, normal examined duodenum.  Repeat EGD in 5 years.   KNEE ARTHROSCOPY WITH MEDIAL MENISECTOMY Left 04/09/2022   Procedure: KNEE ARTHROSCOPY WITH PARTIAL MEDIAL MENISCECTOMY, LATERAL MENISCAL DEBRIDEMENT;  Surgeon: Carole Civil, MD;  Location: [facility];  Service: Orthopedics;  Laterality: Left;   KNEE ARTHROSCOPY WITH MENISCAL REPAIR Left 04/09/2022   Procedure: KNEE ARTHROSCOPY WITH MEDIAL MENISCAL REPAIR;  Surgeon: Carole Civil, MD;  Location: [facility];  Service: Orthopedics;  Laterality: Left;   LAPAROSCOPY     UTERINE FIBROID SURGERY       Current Meds  Medication Sig   acetaminophen (TYLENOL) 500 MG tablet Take 1,000 mg by mouth every 6 (six) hours as needed for moderate pain.  AIMOVIG 140 MG/ML SOAJ INJECT 140 MG UNDER THE SKIN EVERY 30 DAYS   albuterol (VENTOLIN HFA) 108 (90 Base) MCG/ACT inhaler USE 2 INHALATIONS EVERY 6 HOURS AS NEEDED FOR WHEEZING OR SHORTNESS OF BREATH   atorvastatin (LIPITOR) 40 MG tablet Take 1 tablet (40 mg total) by mouth at bedtime.   buPROPion (WELLBUTRIN XL) 300 MG 24 hr tablet TAKE 1 TABLET DAILY   diltiazem (CARDIZEM SR) 120 MG 12 hr capsule Take 2 capsules (240 mg total) by mouth daily.   DULoxetine (CYMBALTA) 60 MG capsule Take 1 capsule (60 mg total) by mouth daily. (Patient taking differently: Take 60 mg by mouth at bedtime.)   Estradiol 10 MCG TABS vaginal tablet Place 10 mcg vaginally every Monday.   famotidine (PEPCID) 40 MG  tablet TAKE 1 TABLET DAILY (Patient taking differently: Take 40 mg by mouth at bedtime.)   Fluticasone-Salmeterol,sensor, (AIRDUO DIGIHALER) 232-14 MCG/ACT AEPB Inhale 1 Inhalation into the lungs 2 (two) times daily.   Lancets (ONETOUCH DELICA PLUS XBJYNW29F) MISC Use to check glucose once daily   linaclotide (LINZESS) 72 MCG capsule Take 1 capsule (72 mcg total) by mouth daily before breakfast.   metoprolol succinate (TOPROL-XL) 50 MG 24 hr tablet Take 0.5 tablets (25 mg total) by mouth daily for 14 days, THEN 1 tablet (50 mg total) daily. Take with or immediately following a meal..   Multiple Vitamin (MULTIVITAMIN WITH MINERALS) TABS tablet Take 1 tablet by mouth daily.   ONETOUCH ULTRA test strip Use to monitor glucose once daily   OZEMPIC, 1 MG/DOSE, 4 MG/3ML SOPN INJECT 1 MG ONCE A WEEK AS DIRECTED   PROLIA 60 MG/ML SOSY injection Inject 60 mg into the skin every 6 (six) months.   RABEprazole (ACIPHEX) 20 MG tablet Take 1 tablet (20 mg total) by mouth daily before breakfast.     Family History  Problem Relation Age of Onset   Breast cancer Mother    Thyroid disease Mother    Stroke Mother    Hypertension Father    Hyperlipidemia Father    Heart attack Father    Heart failure Father    Breast cancer Paternal Grandmother    Cancer - Colon Neg Hx    Gastric cancer Neg Hx    Esophageal cancer Neg Hx    Liver cancer Neg Hx    Autoimmune disease Neg Hx     Social History   Socioeconomic History   Marital status: Married    Spouse name: Not on file   Number of children: Not on file   Years of education: Not on file   Highest education level: Not on file  Occupational History   Not on file  Tobacco Use   Smoking status: Former    Packs/day: 0.50    Types: Cigarettes   Smokeless tobacco: Never  Vaping Use   Vaping Use: Never used  Substance and Sexual Activity   Alcohol use: Yes    Alcohol/week: 4.0 standard drinks of alcohol    Types: 2 Glasses of wine, 2 Shots of liquor  per week    Comment: rarely   Drug use: Never   Sexual activity: Not on file  Other Topics Concern   Not on file  Social History Narrative   Not on file   Social Determinants of Health   Financial Resource Strain: Not on file  Food Insecurity: Not on file  Transportation Needs: Not on file  Physical Activity: Not on file  Stress: Not on file  Social Connections: Not on file       Review of Systems: Gen: Denies fever, chills, cold or flulike symptoms, presyncope, syncope. CV: Denies chest pain, palpitations. Resp: Denies dyspnea, cough. GI: see HPI Derm: Denies rash. Psych: Denies depression, anxiety. Heme: See HPI  Observations/Objective: No distress. Alert and oriented. Pleasant. Well nourished. Normal mood and affect. Unable to perform complete physical exam due to video encounter.   Assessment:  71 y.o. female presenting today with a history of of type 2 diabetes, HTN, HLD, OSA, grade 1 diastolic dysfunction with preserved EF, possible celiac disease though esophageal biopsies non-specific in 2019, Barrett's esophagus, GERD, constipation, adenomatous colon polyps, fatty liver, elevated liver enzymes, presenting today or follow-up s/p EGD completed 02/08/22 revealing 2 cm hiatal hernia, short segments barretts without dysplasia, mild Schatzki ring dilated, gastritis with benign biopsy, normal examined duodenum. Recommended repeat EGD in 5 years.   Dysphagia:  Initially improved after dilation of Schatzki's ring, but now with recurrent solid food and liquid dysphagia.  Symptoms could be secondary to persistent Schatzki's ring vs esophageal motility disorder.  We will arrange barium pill esophagram for further evaluation.  GERD: Previously well controlled on Aciphex 20 mg twice daily and famotidine 40 mg daily.  Reports couple weeks of burning in her upper abdomen and throat.  She is only taking famotidine 40 mg at bedtime.  Apparently stopped taking Aciphex, but is not sure  when this happened.  States it could have been a month or so ago after she saw Dr. Posey Pronto.  We will start her back on Aciphex 20 mg daily for now.  If she continues with frequent breakthrough symptoms, will increase Aciphex back to twice daily.  Constipation: Chronic.  Failed trial of Trulance 3 mg.  Currently only having a bowel movement when taking milk of magnesia.  No alarm symptoms.  Reports trying Linzess several years ago which caused diarrhea, but constipation was not quite as bad back then.  We will retry her on Linzess 72 mcg daily.  Fatty liver/elevated LFTs: Without signs or symptoms of decompensated liver disease.  Most recent labs 01/31/2022 with AST 50, ALT 64.  Screen for viral hepatitis has been negative.  She is immune to hep A.  No immunity to hep B.  No evidence of hemochromatosis.  Very rare alcohol use.  No history of illicit drug use.  She does drink chamomile tea and takes about 1000 mg of Tylenol daily.  No family history of liver disease or autoimmune conditions.  She has lost 27 pounds in the last 4 months which she attributes to starting Ozempic which has caused her to decrease her portion sizes significantly.  She also has not been eating as much in the last couple weeks due to uncontrolled GERD.  Denies nausea, vomiting.    Suspect mild LFT elevation is secondary to fatty liver.  She reports upcoming blood work with Dr. Posey Pronto within the next week.  We will follow-up on this.  If liver enzymes continue to be elevated, will plan for additional work-up.  Additionally, she has never had imaging specifically of her liver (hepatic steatosis previously captured on coronary CT), we will arrange for an ultrasound of her abdomen.  Plan: Barium pill esophagram.  Dysphagia precautions:  Eat slowly, take small bites, chew thoroughly, drink plenty of liquids throughout meals.  Avoid trough textures All meats should be chopped finely.  If something gets hung in your esophagus and will  not come up or go down, proceed  to the emergency room.    Continue famotidine 40 mg at bedtime.  Resume Aciphex 20 mg daily.  Requested patient let me know if frequent reflux continues after a few weeks and we can increase Aciphex back to twice daily.  Start Linzess 72 mcg daily.  Abdominal ultrasound.  Keep upcoming appointment for labs with Dr. Posey Pronto.  I will follow-up on this.  If LFTs are still elevated, will consider additional work-up.   Instructions for fatty liver: Recommend 1-2# weight loss per week until ideal body weight through exercise & diet. Low fat/cholesterol diet.   Avoid sweets, sodas, fruit juices, sweetened beverages like tea, etc. Gradually increase exercise from 15 min daily up to 1 hr per day 5 days/week.  Avoid alcohol for now.   No more than 2000 mg of Tylenol per day.  Avoid OTC supplements and herbal teas.  Next colonoscopy in January 2024.  Next EGD in May 2028.   Follow-up in 3 months or sooner if needed.      I discussed the assessment and treatment plan with the patient. The patient was provided an opportunity to ask questions and all were answered. The patient agreed with the plan and demonstrated an understanding of the instructions.   The patient was advised to call back or seek an in-person evaluation if the symptoms worsen or if the condition fails to improve as anticipated.  I provided 18 minutes of video-face-to-face time during this encounter.  [name], PA-C [facility]  05/17/2022

## 2022-05-17 ENCOUNTER — Telehealth (INDEPENDENT_AMBULATORY_CARE_PROVIDER_SITE_OTHER): Payer: 59 | Admitting: Gastroenterology

## 2022-05-17 ENCOUNTER — Telehealth: Payer: Self-pay | Admitting: *Deleted

## 2022-05-17 ENCOUNTER — Telehealth: Payer: Self-pay | Admitting: Gastroenterology

## 2022-05-17 ENCOUNTER — Encounter: Payer: Self-pay | Admitting: Gastroenterology

## 2022-05-17 VITALS — Ht 62.0 in | Wt 175.0 lb

## 2022-05-17 DIAGNOSIS — K219 Gastro-esophageal reflux disease without esophagitis: Secondary | ICD-10-CM | POA: Diagnosis not present

## 2022-05-17 DIAGNOSIS — K760 Fatty (change of) liver, not elsewhere classified: Secondary | ICD-10-CM | POA: Diagnosis not present

## 2022-05-17 DIAGNOSIS — R1310 Dysphagia, unspecified: Secondary | ICD-10-CM

## 2022-05-17 DIAGNOSIS — K5904 Chronic idiopathic constipation: Secondary | ICD-10-CM | POA: Diagnosis not present

## 2022-05-17 DIAGNOSIS — R7989 Other specified abnormal findings of blood chemistry: Secondary | ICD-10-CM

## 2022-05-17 MED ORDER — RABEPRAZOLE SODIUM 20 MG PO TBEC: 20.0000 mg | DELAYED_RELEASE_TABLET | Freq: Every day | ORAL | 3 refills | Status: DC

## 2022-05-17 MED ORDER — LINACLOTIDE 72 MCG PO CAPS: 72.0000 ug | ORAL_CAPSULE | Freq: Every day | ORAL | 3 refills | Status: DC

## 2022-05-17 NOTE — Telephone Encounter (Signed)
Patient informed that [place] is scheduled for 05/28/22 at 8:30, arrive at 8:15 am, nothing to eat or drink after midnight. BPE is scheduled for 05/22/22 at 9:00 am arrive at 8:30 am, nothing to eat or drink 3 hours prior to procedure. Both procedures are at [facility]. Verbalized understanding.

## 2022-05-17 NOTE — Telephone Encounter (Signed)
Monique Warren, you are scheduled for a virtual visit with your provider today.  Just as we do with appointments in the office, we must obtain your consent to participate.  Your consent will be active for this visit and any virtual visit you may have with one of our providers in the next 365 days.  If you have a MyChart account, I can also send a copy of this consent to you electronically.  All virtual visits are billed to your insurance company just like a traditional visit in the office.  As this is a virtual visit, video technology does not allow for your provider to perform a traditional examination.  This may limit your provider's ability to fully assess your condition.  If your provider identifies any concerns that need to be evaluated in person or the need to arrange testing such as labs, EKG, etc, we will make arrangements to do so.  Although advances in technology are sophisticated, we cannot ensure that it will always work on either your end or our end.  If the connection with a video visit is poor, we may have to switch to a telephone visit.  With either a video or telephone visit, we are not always able to ensure that we have a secure connection.   I need to obtain your verbal consent now.   Are you willing to proceed with your visit today?  Patient consent to virtual visit.

## 2022-05-17 NOTE — Telephone Encounter (Signed)
Monique Warren/Monique Warren:  Please arrange abdominal ultrasound at [facility].  DX: Fatty liver, elevated LFTs. Please also arrange BPE at [facility].  Dx: Dysphagia  Monique Warren/Mandy:  Patient needs follow-up in 3 months. Let's have her come in to the office for the visit this time.

## 2022-05-17 NOTE — Patient Instructions (Signed)
We will arrange for you to have a swallowing study at [facility].  We will also arrange for you to have an ultrasound of your abdomen at [place] your appointment with Dr. Posey Pronto for blood work in the next week.  I will follow-up on this.  We will let you know if any additional labs are needed.  For reflux: Resume taking Aciphex 20 mg once daily. Continue taking Pepcid 40 mg at bedtime. After a few weeks, if you continue with frequent breakthrough reflux, please let me know and we can increase Aciphex back to twice daily.  Swallowing precautions:  Eat slowly, take small bites, chew thoroughly, drink plenty of liquids throughout meals.  Avoid trough textures All meats should be chopped finely.  If something gets hung in your esophagus and will not come up or go down, proceed to the emergency room.    For constipation: Start Linzess 72 mcg daily on empty stomach, at least 30 minutes before breakfast. After 1-2 weeks, if this is not strong enough, please let me know.  For fatty liver/elevated liver enzymes: Recommend 1-2# weight loss per week until ideal body weight through exercise & diet. Low fat/cholesterol diet.   Avoid sweets, sodas, fruit juices, sweetened beverages like tea, etc. Gradually increase exercise from 15 min daily up to 1 hr per day 5 days/week as you are able. Avoid alcohol use for now. No more than 2000 mg of Tylenol per day. I recommend that you avoid over-the-counter supplements and herbal teas for now as these can contribute to elevated liver enzymes. We will have further recommendations for you pending your upcoming blood work with Dr. Posey Pronto.   It was great to see you again today!  We will follow-up in 3 months.  Do not hesitate to call sooner if you have questions or concerns.   [name], PA-C [facility]

## 2022-05-20 ENCOUNTER — Other Ambulatory Visit: Payer: Self-pay | Admitting: Nurse Practitioner

## 2022-05-21 ENCOUNTER — Ambulatory Visit: Payer: 59 | Admitting: Nurse Practitioner

## 2022-05-22 ENCOUNTER — Ambulatory Visit (HOSPITAL_COMMUNITY)
Admission: RE | Admit: 2022-05-22 | Discharge: 2022-05-22 | Disposition: A | Discharge status: Home / Self Care | Payer: 59 | Source: Ambulatory Visit | Attending: Gastroenterology | Admitting: Gastroenterology

## 2022-05-22 DIAGNOSIS — R1310 Dysphagia, unspecified: Secondary | ICD-10-CM | POA: Diagnosis not present

## 2022-05-22 NOTE — Telephone Encounter (Signed)
Pt is scheduled for 08/21/22 at 2pm - mailed an appt card to patient

## 2022-05-22 NOTE — Progress Notes (Signed)
Pt scheduled for 12/6 @ 2pm

## 2022-05-23 ENCOUNTER — Encounter: Payer: Self-pay | Admitting: Internal Medicine

## 2022-05-23 NOTE — Telephone Encounter (Signed)
Called patient. She said she has been having chest tightness that comes and goes. She is not having it right now, because she is laying down. Encouraged patient to go to the ED for chest pain that gets worse and does not go away. Patient agreed.

## 2022-05-24 ENCOUNTER — Ambulatory Visit: Payer: 59 | Attending: Cardiovascular Disease | Admitting: Cardiovascular Disease

## 2022-05-24 ENCOUNTER — Encounter: Payer: Self-pay | Admitting: Cardiovascular Disease

## 2022-05-24 VITALS — BP 122/70 | HR 70 | Ht 62.0 in | Wt 188.2 lb

## 2022-05-24 DIAGNOSIS — R079 Chest pain, unspecified: Secondary | ICD-10-CM

## 2022-05-24 NOTE — Progress Notes (Signed)
Chief Complaint  Patient presents with   Follow-up    Chest pain    History of Present Illness: 71 yo female with history of arthritis, asthma, depression, DM, hiatal hernia, HTN, HLD, sleep apnea and migraines who is here today as an add on to my schedule for evaluation of chest pain. She is followed in our office by Dr. Harrington Challenger. She was evaluated by Dr. Harrington Challenger in December 2022 for chest pain. Hyperdynamic LV function in 2022. Coronary CTA March 2023 with no evidence of CAD. Calcium score of zero. Cardiac MRI in May 2023 with evidence of asymmetric septal hypertrophy with SAM. She is added on today with c/o chest pain. She is being followed by GI and is felt to have severe GERD. EGD May 2023 with hiatal hernia, gastritis and Barrett's esophagus.   Primary Care Physician: Lindell Spar, MD  [facility] Cardiology: Harrington Challenger  Past Medical History:  Diagnosis Date   Anxiety    Arthritis    Asthma    Celiac disease    Depression    Diabetes mellitus, type II ([place])    Diabetic neuropathy ([place])    Diastolic dysfunction    Grade 1 with preserved EF   Dysrhythmia    Heart murmur    Hiatal hernia    Sliding   Hyperlipidemia    Hypertension    Migraine    Obstructive sleep apnea    Osteoporosis    Vertigo     Past Surgical History:  Procedure Laterality Date   ABDOMINAL HYSTERECTOMY  09/29/2003   APPENDECTOMY     BALLOON DILATION N/A 02/08/2022   Procedure: BALLOON DILATION;  Surgeon: Eloise Harman, DO;  Location: [facility];  Service: Endoscopy;  Laterality: N/A;   BIOPSY  02/08/2022   Procedure: BIOPSY;  Surgeon: Eloise Harman, DO;  Location: [facility];  Service: Endoscopy;;   BREAST BIOPSY Left    CARPAL TUNNEL RELEASE     CATARACT EXTRACTION Bilateral    October and November 2016   COLONOSCOPY  09/2017   [name], MD in [place]; 5 mm tubular adenoma in the descending colon s/p resected, mild sigmoid diverticulosis, internal hemorrhoids.  Recommended repeat  colonoscopy in 5 years.   CRANIOTOMY FOR ANEURYSM / VERTEBROBASILAR / CAROTID CIRCULATION Right 10/24/2015   ESOPHAGOGASTRODUODENOSCOPY  09/2017   [name]; irregular Z-line s/p biopsy, decreased LES tone, 4 cm hiatal hernia, erythema in gastric antrum biopsied, normal examined duodenum biopsied.  Esophageal biopsy suggestive of reflux, no Barrett's, gastric biopsy with nonspecific chronic gastritis with intestinal metaplasia, duodenal biopsy with increased intraepithelial lymphocytes without blunting of villi, nonspecific.   ESOPHAGOGASTRODUODENOSCOPY (EGD) WITH PROPOFOL N/A 02/08/2022   Surgeon: Eloise Harman, DO; 2 cm hiatal hernia, short segment Barrett's esophagus without dysplasia, mild Schatzki's ring dilated, gastritis with biopsies benign, normal examined duodenum.  Repeat EGD in 5 years.   KNEE ARTHROSCOPY WITH MEDIAL MENISECTOMY Left 04/09/2022   Procedure: KNEE ARTHROSCOPY WITH PARTIAL MEDIAL MENISCECTOMY, LATERAL MENISCAL DEBRIDEMENT;  Surgeon: Carole Civil, MD;  Location: [facility];  Service: Orthopedics;  Laterality: Left;   KNEE ARTHROSCOPY WITH MENISCAL REPAIR Left 04/09/2022   Procedure: KNEE ARTHROSCOPY WITH MEDIAL MENISCAL REPAIR;  Surgeon: Carole Civil, MD;  Location: [facility];  Service: Orthopedics;  Laterality: Left;   LAPAROSCOPY     UTERINE FIBROID SURGERY      Current Outpatient Medications  Medication Sig Dispense Refill   acetaminophen (TYLENOL) 500 MG tablet Take 1,000 mg by mouth  every 6 (six) hours as needed for moderate pain.     AIMOVIG 140 MG/ML SOAJ INJECT 140 MG UNDER THE SKIN EVERY 30 DAYS 1 mL 11   albuterol (VENTOLIN HFA) 108 (90 Base) MCG/ACT inhaler USE 2 INHALATIONS EVERY 6 HOURS AS NEEDED FOR WHEEZING OR SHORTNESS OF BREATH 8.5 g 10   atorvastatin (LIPITOR) 40 MG tablet Take 1 tablet (40 mg total) by mouth at bedtime. 90 tablet 3   buPROPion (WELLBUTRIN XL) 300 MG 24 hr tablet TAKE 1 TABLET DAILY 90 tablet 3   diltiazem (CARDIZEM SR) 120 MG  12 hr capsule Take 2 capsules (240 mg total) by mouth daily. 180 capsule 2   DULoxetine (CYMBALTA) 60 MG capsule Take 1 capsule (60 mg total) by mouth daily. (Patient taking differently: Take 60 mg by mouth at bedtime.) 90 capsule 1   Estradiol 10 MCG TABS vaginal tablet Place 10 mcg vaginally every Monday.     famotidine (PEPCID) 40 MG tablet TAKE 1 TABLET DAILY (Patient taking differently: Take 40 mg by mouth at bedtime.) 90 tablet 3   Fluticasone-Salmeterol,sensor, (AIRDUO DIGIHALER) 232-14 MCG/ACT AEPB Inhale 1 Inhalation into the lungs 2 (two) times daily. 1 each 5   Lancets (ONETOUCH DELICA PLUS [id number]) MISC Use to check glucose once daily 100 each 6   linaclotide (LINZESS) 72 MCG capsule Take 1 capsule (72 mcg total) by mouth daily before breakfast. 30 capsule 3   metoprolol succinate (TOPROL-XL) 50 MG 24 hr tablet Take 0.5 tablets (25 mg total) by mouth daily for 14 days, THEN 1 tablet (50 mg total) daily. Take with or immediately following a meal.. 90 tablet 3   Multiple Vitamin (MULTIVITAMIN WITH MINERALS) TABS tablet Take 1 tablet by mouth daily.     ONETOUCH ULTRA test strip Use to monitor glucose once daily 100 each 6   OZEMPIC, 1 MG/DOSE, 4 MG/3ML SOPN INJECT 1 MG ONCE A WEEK AS DIRECTED 6 mL 0   PROLIA 60 MG/ML SOSY injection Inject 60 mg into the skin every 6 (six) months. 1 mL 1   RABEprazole (ACIPHEX) 20 MG tablet Take 1 tablet (20 mg total) by mouth daily before breakfast. 30 tablet 3   No current facility-administered medications for this visit.    Allergies  Allergen Reactions   Cefuroxime Anaphylaxis, Hives, Shortness Of Breath, Swelling and Anxiety   Codeine Hives, Shortness Of Breath, Nausea And Vomiting, Swelling and Anxiety   Sulfa Antibiotics Anaphylaxis, Hives, Nausea And Vomiting and Swelling    Swollen throat   Gluten Meal     Severe reflux - celiac disease    Social History   Socioeconomic History   Marital status: Married    Spouse name: Not on file    Number of children: Not on file   Years of education: Not on file   Highest education level: Not on file  Occupational History   Not on file  Tobacco Use   Smoking status: Former    Packs/day: 0.50    Types: Cigarettes   Smokeless tobacco: Never  Vaping Use   Vaping Use: Never used  Substance and Sexual Activity   Alcohol use: Yes    Alcohol/week: 4.0 standard drinks of alcohol    Types: 2 Glasses of wine, 2 Shots of liquor per week    Comment: rarely   Drug use: Never   Sexual activity: Not on file  Other Topics Concern   Not on file  Social History Narrative   Not on file  Social Determinants of Health   Financial Resource Strain: Not on file  Food Insecurity: Not on file  Transportation Needs: Not on file  Physical Activity: Not on file  Stress: Not on file  Social Connections: Not on file  Intimate Partner Violence: Not on file    Family History  Problem Relation Age of Onset   Breast cancer Mother    Thyroid disease Mother    Stroke Mother    Hypertension Father    Hyperlipidemia Father    Heart attack Father    Heart failure Father    Breast cancer Paternal Grandmother    Cancer - Colon Neg Hx    Gastric cancer Neg Hx    Esophageal cancer Neg Hx    Liver cancer Neg Hx    Autoimmune disease Neg Hx     Review of Systems:  As stated in the HPI and otherwise negative.   BP 122/70   Pulse 70   Ht 5' 2"  (1.575 m)   Wt 188 lb 3.2 oz (85.4 kg)   SpO2 98%   BMI 34.42 kg/m   Physical Examination: General: Well developed, well nourished, NAD  HEENT: OP clear, mucus membranes moist  SKIN: warm, dry. No rashes. Neuro: No focal deficits  Musculoskeletal: Muscle strength 5/5 all ext  Psychiatric: Mood and affect normal  Neck: No JVD, no carotid bruits, no thyromegaly, no lymphadenopathy.  Lungs:Clear bilaterally, no wheezes, rhonci, crackles Cardiovascular: Regular rate and rhythm. No murmurs, gallops or rubs. Abdomen:Soft. Bowel sounds present.  Non-tender.  Extremities: No lower extremity edema. Pulses are 2 + in the bilateral DP/PT.  EKG:  EKG is ordered today. The ekg ordered today demonstrates Sinus, LVH  Recent Labs: 10/02/2021: TSH 2.830 01/31/2022: ALT 64 04/05/2022: BUN 15; Creatinine, Ser 0.79; Hemoglobin 15.3; Platelets 320; Potassium 3.7; Sodium 139   Lipid Panel    Component Value Date/Time   CHOL 184 10/02/2021 1626   TRIG 137 10/02/2021 1626   HDL 51 10/02/2021 1626   CHOLHDL 3.6 10/02/2021 1626   LDLCALC 109 (H) 10/02/2021 1626     Wt Readings from Last 3 Encounters:  05/24/22 188 lb 3.2 oz (85.4 kg)  05/17/22 175 lb (79.4 kg)  04/09/22 190 lb (86.2 kg)      Assessment and Plan:   1. Chest pain: Her pain is atypical, only at rest. She had a normal coronary CTA in March 2023 with no evidence of CAD and calcium score of zero. EKG today shows sinus with no ischemic changes. I do not think her pain is cardiac related. Her pain is most likely GI related. She is waiting now to start Aciphex per the GI team.   Labs/ tests ordered today include:   Orders Placed This Encounter  Procedures   EKG 12-Lead   Disposition:   F/U with Dr. Harrington Challenger as planned.   Signed, Lauree Chandler, MD, [facility] 05/24/2022 3:54 PM    [address], [place], [place]  [address] Phone: [phone]; Fax: [phone]

## 2022-05-24 NOTE — Patient Instructions (Addendum)
Medication Instructions:  The current medical regimen is effective;  continue present plan and medications.  *If you need a refill on your cardiac medications before your next appointment, please call your pharmacy*  Follow-Up: At [facility], you and your health needs are our priority.  As part of our continuing mission to provide you with exceptional heart care, we have created designated Provider Care Teams.  These Care Teams include your primary Cardiologist (physician) and Advanced Practice Providers (APPs -  Physician Assistants and Nurse Practitioners) who all work together to provide you with the care you need, when you need it.  We recommend signing up for the patient portal called "MyChart".  Sign up information is provided on this After Visit Summary.  MyChart is used to connect with patients for Virtual Visits (Telemedicine).  Patients are able to view lab/test results, encounter notes, upcoming appointments, etc.  Non-urgent messages can be sent to your provider as well.   To learn more about what you can do with MyChart, go to [email or web address].    Your next appointment:   As previously scheduled.  Important Information About Sugar

## 2022-05-28 ENCOUNTER — Ambulatory Visit (HOSPITAL_COMMUNITY)
Admission: RE | Admit: 2022-05-28 | Discharge: 2022-05-28 | Disposition: A | Discharge status: Home / Self Care | Payer: 59 | Source: Ambulatory Visit | Attending: Gastroenterology | Admitting: Gastroenterology

## 2022-05-28 DIAGNOSIS — K760 Fatty (change of) liver, not elsewhere classified: Secondary | ICD-10-CM | POA: Diagnosis present

## 2022-05-28 DIAGNOSIS — R7989 Other specified abnormal findings of blood chemistry: Secondary | ICD-10-CM | POA: Diagnosis present

## 2022-05-29 ENCOUNTER — Ambulatory Visit: Payer: 59 | Admitting: Gastroenterology

## 2022-05-29 ENCOUNTER — Encounter: Payer: Self-pay | Admitting: Internal Medicine

## 2022-06-03 ENCOUNTER — Ambulatory Visit (INDEPENDENT_AMBULATORY_CARE_PROVIDER_SITE_OTHER): Payer: 59 | Admitting: Orthopedic Surgery

## 2022-06-03 ENCOUNTER — Encounter: Payer: Self-pay | Admitting: Orthopedic Surgery

## 2022-06-03 DIAGNOSIS — Z9889 Other specified postprocedural states: Secondary | ICD-10-CM

## 2022-06-03 DIAGNOSIS — M1712 Unilateral primary osteoarthritis, left knee: Secondary | ICD-10-CM

## 2022-06-03 NOTE — Progress Notes (Signed)
Chief Complaint  Patient presents with   Post-op Follow-up    Left knee 04/09/22 still has pain with knee/ especially with stairs    8 to 9-week status post arthroscopy left knee patient had grade 4 arthritis in the patellofemoral joint she had medial lateral meniscal partial resections with repair of part of the meniscus as well  She has improved but is still having difficulties with steps and some medial knee pain  We discussed this and decided to follow her in January expecting that she may progress to total knee replacement

## 2022-06-05 ENCOUNTER — Ambulatory Visit (INDEPENDENT_AMBULATORY_CARE_PROVIDER_SITE_OTHER): Payer: 59

## 2022-06-05 DIAGNOSIS — Z23 Encounter for immunization: Secondary | ICD-10-CM | POA: Diagnosis not present

## 2022-06-06 ENCOUNTER — Telehealth: Payer: Self-pay | Admitting: Gastroenterology

## 2022-06-06 LAB — CBC WITH DIFFERENTIAL/PLATELET
Basophils Absolute: 0 10*3/uL (ref 0.0–0.2)
Basos: 1 %
EOS (ABSOLUTE): 0.1 10*3/uL (ref 0.0–0.4)
Eos: 1 %
Hematocrit: 45.4 % (ref 34.0–46.6)
Hemoglobin: 15.4 g/dL (ref 11.1–15.9)
Immature Grans (Abs): 0 10*3/uL (ref 0.0–0.1)
Immature Granulocytes: 0 %
Lymphocytes Absolute: 1.9 10*3/uL (ref 0.7–3.1)
Lymphs: 29 %
MCH: 31.7 pg (ref 26.6–33.0)
MCHC: 33.9 g/dL (ref 31.5–35.7)
MCV: 93 fL (ref 79–97)
Monocytes Absolute: 0.5 10*3/uL (ref 0.1–0.9)
Monocytes: 7 %
Neutrophils Absolute: 4 10*3/uL (ref 1.4–7.0)
Neutrophils: 62 %
Platelets: 315 10*3/uL (ref 150–450)
RBC: 4.86 x10E6/uL (ref 3.77–5.28)
RDW: 12.8 % (ref 11.7–15.4)
WBC: 6.5 10*3/uL (ref 3.4–10.8)

## 2022-06-06 LAB — CMP14+EGFR
ALT: 21 IU/L (ref 0–32)
AST: 18 IU/L (ref 0–40)
Albumin/Globulin Ratio: 2.1 (ref 1.2–2.2)
Albumin: 4.1 g/dL (ref 3.9–4.9)
Alkaline Phosphatase: 86 IU/L (ref 44–121)
BUN/Creatinine Ratio: 17 (ref 12–28)
BUN: 13 mg/dL (ref 8–27)
Bilirubin Total: 0.4 mg/dL (ref 0.0–1.2)
CO2: 16 mmol/L — ABNORMAL LOW (ref 20–29)
Calcium: 9 mg/dL (ref 8.7–10.3)
Chloride: 106 mmol/L (ref 96–106)
Creatinine, Ser: 0.76 mg/dL (ref 0.57–1.00)
Globulin, Total: 2 g/dL (ref 1.5–4.5)
Glucose: 111 mg/dL — ABNORMAL HIGH (ref 70–99)
Potassium: 4.6 mmol/L (ref 3.5–5.2)
Sodium: 142 mmol/L (ref 134–144)
Total Protein: 6.1 g/dL (ref 6.0–8.5)
eGFR: 84 mL/min/{1.73_m2} (ref >59)

## 2022-06-06 LAB — LIPID PANEL
Chol/HDL Ratio: 6 ratio — ABNORMAL HIGH (ref 0.0–4.4)
Cholesterol, Total: 246 mg/dL — ABNORMAL HIGH (ref 100–199)
HDL: 41 mg/dL (ref >39)
LDL Chol Calc (NIH): 174 mg/dL — ABNORMAL HIGH (ref 0–99)
Triglycerides: 165 mg/dL — ABNORMAL HIGH (ref 0–149)
VLDL Cholesterol Cal: 31 mg/dL (ref 5–40)

## 2022-06-06 LAB — HEMOGLOBIN A1C
Est. average glucose Bld gHb Est-mCnc: 126 mg/dL
Hgb A1c MFr Bld: 6 % — ABNORMAL HIGH (ref 4.8–5.6)

## 2022-06-06 LAB — TSH: TSH: 1.78 u[IU]/mL (ref 0.450–4.500)

## 2022-06-06 LAB — VITAMIN D 25 HYDROXY (VIT D DEFICIENCY, FRACTURES): Vit D, 25-Hydroxy: 27.7 ng/mL — ABNORMAL LOW (ref 30.0–100.0)

## 2022-06-06 NOTE — Telephone Encounter (Signed)
LMOM for pt to call office

## 2022-06-06 NOTE — Telephone Encounter (Signed)
Spoke to pt, informed her of results and recommendations. Pt voiced understanding.

## 2022-06-06 NOTE — Telephone Encounter (Signed)
Reviewed labs completed yesterday (9/20) with PCP.   Monique Warren, please let patient know I reviewed her labs she completed yesterday (9/20) with PCP. Her liver enzymes have normalized. I suspect prior elevation was secondary to fatty liver as we discussed. Continue with recommendations provided at last OV.

## 2022-06-12 ENCOUNTER — Ambulatory Visit: Payer: 59 | Admitting: Nurse Practitioner

## 2022-06-12 ENCOUNTER — Encounter: Payer: Self-pay | Admitting: Orthopedic Surgery

## 2022-06-21 ENCOUNTER — Ambulatory Visit: Payer: 59 | Admitting: Nurse Practitioner

## 2022-07-01 ENCOUNTER — Other Ambulatory Visit: Payer: Self-pay | Admitting: Internal Medicine

## 2022-07-01 ENCOUNTER — Ambulatory Visit: Payer: 59 | Admitting: Internal Medicine

## 2022-07-02 ENCOUNTER — Encounter: Payer: Self-pay | Admitting: Internal Medicine

## 2022-07-02 ENCOUNTER — Ambulatory Visit: Payer: 59 | Admitting: Internal Medicine

## 2022-07-02 VITALS — BP 124/78 | HR 58 | Resp 18 | Ht 62.0 in | Wt 191.6 lb

## 2022-07-02 DIAGNOSIS — G43009 Migraine without aura, not intractable, without status migrainosus: Secondary | ICD-10-CM | POA: Diagnosis not present

## 2022-07-02 DIAGNOSIS — M810 Age-related osteoporosis without current pathological fracture: Secondary | ICD-10-CM

## 2022-07-02 DIAGNOSIS — E1140 Type 2 diabetes mellitus with diabetic neuropathy, unspecified: Secondary | ICD-10-CM | POA: Diagnosis not present

## 2022-07-02 DIAGNOSIS — G5601 Carpal tunnel syndrome, right upper limb: Secondary | ICD-10-CM

## 2022-07-02 DIAGNOSIS — M25511 Pain in right shoulder: Secondary | ICD-10-CM | POA: Diagnosis not present

## 2022-07-02 MED ORDER — DENOSUMAB 60 MG/ML ~~LOC~~ SOSY
60.0000 mg | PREFILLED_SYRINGE | Freq: Once | SUBCUTANEOUS | Status: AC
  Administered 2022-07-02: 60 mg via SUBCUTANEOUS

## 2022-07-02 MED ORDER — GABAPENTIN 300 MG PO CAPS: 300.0000 mg | ORAL_CAPSULE | Freq: Every day | ORAL | 2 refills | Status: DC

## 2022-07-02 NOTE — Patient Instructions (Signed)
Please start taking Gabapentin as prescribed.  Please continue to take Tylenol as needed for joint pain.

## 2022-07-03 ENCOUNTER — Ambulatory Visit (INDEPENDENT_AMBULATORY_CARE_PROVIDER_SITE_OTHER): Payer: 59 | Admitting: Nurse Practitioner

## 2022-07-03 ENCOUNTER — Encounter: Payer: Self-pay | Admitting: Nurse Practitioner

## 2022-07-03 VITALS — BP 111/75 | HR 74 | Ht 62.0 in | Wt 191.0 lb

## 2022-07-03 DIAGNOSIS — E1140 Type 2 diabetes mellitus with diabetic neuropathy, unspecified: Secondary | ICD-10-CM

## 2022-07-03 DIAGNOSIS — I10 Essential (primary) hypertension: Secondary | ICD-10-CM

## 2022-07-03 DIAGNOSIS — E782 Mixed hyperlipidemia: Secondary | ICD-10-CM | POA: Diagnosis not present

## 2022-07-03 NOTE — Progress Notes (Signed)
Endocrinology Follow Up Note       07/03/2022, 4:11 PM   Subjective:    Patient ID: Monique Warren, female    DOB: [birth year].  Monique Warren is being seen in follow up after being seen in consultation for management of currently uncontrolled symptomatic diabetes requested by  Lindell Spar, MD.   Past Medical History:  Diagnosis Date   Anxiety    Arthritis    Asthma    Celiac disease    Depression    Diabetes mellitus, type II ([place])    Diabetic neuropathy ([place])    Diastolic dysfunction    Grade 1 with preserved EF   Dysrhythmia    Heart murmur    Hiatal hernia    Sliding   Hyperlipidemia    Hypertension    Migraine    Obstructive sleep apnea    Osteoporosis    Vertigo     Past Surgical History:  Procedure Laterality Date   ABDOMINAL HYSTERECTOMY  09/29/2003   APPENDECTOMY     BALLOON DILATION N/A 02/08/2022   Procedure: BALLOON DILATION;  Surgeon: Eloise Harman, DO;  [facility]: [facility];  Service: Endoscopy;  Laterality: N/A;   BIOPSY  02/08/2022   Procedure: BIOPSY;  Surgeon: Eloise Harman, DO;  Location: [facility];  Service: Endoscopy;;   BREAST BIOPSY Left    CARPAL TUNNEL RELEASE     CATARACT EXTRACTION Bilateral    October and November 2016   COLONOSCOPY  09/2017   [name], MD in [place]; 5 mm tubular adenoma in the descending colon s/p resected, mild sigmoid diverticulosis, internal hemorrhoids.  Recommended repeat colonoscopy in 5 years.   CRANIOTOMY FOR ANEURYSM / VERTEBROBASILAR / CAROTID CIRCULATION Right 10/24/2015   ESOPHAGOGASTRODUODENOSCOPY  09/2017   [name]; irregular Z-line s/p biopsy, decreased LES tone, 4 cm hiatal hernia, erythema in gastric antrum biopsied, normal examined duodenum biopsied.  Esophageal biopsy suggestive of reflux, no Barrett's, gastric biopsy with nonspecific chronic gastritis with intestinal metaplasia, duodenal biopsy with  increased intraepithelial lymphocytes without blunting of villi, nonspecific.   ESOPHAGOGASTRODUODENOSCOPY (EGD) WITH PROPOFOL N/A 02/08/2022   Surgeon: Eloise Harman, DO; 2 cm hiatal hernia, short segment Barrett's esophagus without dysplasia, mild Schatzki's ring dilated, gastritis with biopsies benign, normal examined duodenum.  Repeat EGD in 5 years.   KNEE ARTHROSCOPY WITH MEDIAL MENISECTOMY Left 04/09/2022   Procedure: KNEE ARTHROSCOPY WITH PARTIAL MEDIAL MENISCECTOMY, LATERAL MENISCAL DEBRIDEMENT;  Surgeon: Carole Civil, MD;  Location: [facility];  Service: Orthopedics;  Laterality: Left;   KNEE ARTHROSCOPY WITH MENISCAL REPAIR Left 04/09/2022   Procedure: KNEE ARTHROSCOPY WITH MEDIAL MENISCAL REPAIR;  Surgeon: Carole Civil, MD;  Location: [facility];  Service: Orthopedics;  Laterality: Left;   LAPAROSCOPY     UTERINE FIBROID SURGERY      Social History   Socioeconomic History   Marital status: Married    Spouse name: Not on file   Number of children: Not on file   Years of education: Not on file   Highest education level: Not on file  Occupational History   Not on file  Tobacco Use   Smoking status: Former  Packs/day: 0.50    Types: Cigarettes   Smokeless tobacco: Never  Vaping Use   Vaping Use: Never used  Substance and Sexual Activity   Alcohol use: Yes    Alcohol/week: 4.0 standard drinks of alcohol    Types: 2 Glasses of wine, 2 Shots of liquor per week    Comment: rarely   Drug use: Never   Sexual activity: Not on file  Other Topics Concern   Not on file  Social History Narrative   Not on file   Social Determinants of Health   Financial Resource Strain: Not on file  Food Insecurity: Not on file  Transportation Needs: Not on file  Physical Activity: Not on file  Stress: Not on file  Social Connections: Not on file    Family History  Problem Relation Age of Onset   Breast cancer Mother    Thyroid disease Mother    Stroke Mother     Hypertension Father    Hyperlipidemia Father    Heart attack Father    Heart failure Father    Breast cancer Paternal Grandmother    Cancer - Colon Neg Hx    Gastric cancer Neg Hx    Esophageal cancer Neg Hx    Liver cancer Neg Hx    Autoimmune disease Neg Hx     Outpatient Encounter Medications as of 07/03/2022  Medication Sig   acetaminophen (TYLENOL) 500 MG tablet Take 1,000 mg by mouth every 6 (six) hours as needed for moderate pain.   AIMOVIG 140 MG/ML SOAJ INJECT 140 MG UNDER THE SKIN EVERY 30 DAYS   albuterol (VENTOLIN HFA) 108 (90 Base) MCG/ACT inhaler USE 2 INHALATIONS EVERY 6 HOURS AS NEEDED FOR WHEEZING OR SHORTNESS OF BREATH   atorvastatin (LIPITOR) 40 MG tablet Take 1 tablet (40 mg total) by mouth at bedtime.   buPROPion (WELLBUTRIN XL) 300 MG 24 hr tablet TAKE 1 TABLET DAILY   diltiazem (CARDIZEM SR) 120 MG 12 hr capsule Take 2 capsules (240 mg total) by mouth daily.   DULoxetine (CYMBALTA) 60 MG capsule Take 1 capsule (60 mg total) by mouth daily. (Patient taking differently: Take 60 mg by mouth at bedtime.)   Estradiol 10 MCG TABS vaginal tablet Place 10 mcg vaginally every Monday.   famotidine (PEPCID) 40 MG tablet TAKE 1 TABLET DAILY (Patient taking differently: Take 40 mg by mouth at bedtime.)   Fluticasone-Salmeterol,sensor, (AIRDUO DIGIHALER) 232-14 MCG/ACT AEPB Inhale 1 Inhalation into the lungs 2 (two) times daily.   gabapentin (NEURONTIN) 300 MG capsule Take 1 capsule (300 mg total) by mouth at bedtime.   Lancets (ONETOUCH DELICA PLUS [id number]) MISC Use to check glucose once daily   linaclotide (LINZESS) 72 MCG capsule Take 1 capsule (72 mcg total) by mouth daily before breakfast.   metoprolol succinate (TOPROL-XL) 50 MG 24 hr tablet Take 0.5 tablets (25 mg total) by mouth daily for 14 days, THEN 1 tablet (50 mg total) daily. Take with or immediately following a meal..   Multiple Vitamin (MULTIVITAMIN WITH MINERALS) TABS tablet Take 1 tablet by mouth daily.    ONETOUCH ULTRA test strip Use to monitor glucose once daily   OZEMPIC, 1 MG/DOSE, 4 MG/3ML SOPN INJECT 1 MG ONCE A WEEK AS DIRECTED   PROLIA 60 MG/ML SOSY injection Inject 60 mg into the skin every 6 (six) months.   RABEprazole (ACIPHEX) 20 MG tablet Take 1 tablet (20 mg total) by mouth daily before breakfast.   zolmitriptan (ZOMIG) 5 MG tablet Take 5  mg by mouth as needed.   No facility-administered encounter medications on file as of 07/03/2022.    ALLERGIES: Allergies  Allergen Reactions   Cefuroxime Anaphylaxis, Hives, Shortness Of Breath, Swelling and Anxiety   Codeine Hives, Shortness Of Breath, Nausea And Vomiting, Swelling and Anxiety   Sulfa Antibiotics Anaphylaxis, Hives, Nausea And Vomiting and Swelling    Swollen throat   Gluten Meal     Severe reflux - celiac disease    VACCINATION STATUS: Immunization History  Administered Date(s) Administered   Fluad Quad(high Dose 65+) 07/02/2021, 06/05/2022   Influenza Split 07/26/2014   Influenza, High Dose Seasonal PF 09/22/2018   Influenza-Unspecified 07/26/2014   PFIZER(Purple Top)SARS-COV-2 Vaccination 12/01/2019, 12/29/2019   Pneumococcal Conjugate-13 08/02/2019   Pneumococcal Polysaccharide-23 07/28/2020   Tdap 09/30/2016   Zoster Recombinat (Shingrix) 06/03/2018, 11/10/2019    Diabetes She presents for her follow-up diabetic visit. She has type 2 diabetes mellitus. Onset time: Diagnosed at approx age of 76. Her disease course has been improving. There are no hypoglycemic associated symptoms. Associated symptoms include chest pain (ongoing work up with cardiology) and foot paresthesias. Pertinent negatives for diabetes include no polyuria and no weight loss. There are no hypoglycemic complications. Symptoms are stable. Diabetic complications include nephropathy and peripheral neuropathy. Risk factors for coronary artery disease include diabetes mellitus, dyslipidemia, family history, hypertension, obesity, post-menopausal  and sedentary lifestyle. Current diabetic treatments: Ozempic 1 mg SQ weekly. She is compliant with treatment all of the time. Her weight is stable. She is following a generally healthy diet. When asked about meal planning, she reported none. She has not had a previous visit with a dietitian. She participates in exercise intermittently. Her home blood glucose trend is fluctuating minimally. Her overall blood glucose range is 130-140 mg/dl. (She presents today with her meter and logs showing mostly at target fasting glycemic profile.  Her previsit A1c on 9/20 checked by her PCP was 6%, improving from last visit of 6.6%.  She is tolerating the Ozempic well.  She denies any hypoglycemia.) An ACE inhibitor/angiotensin II receptor blocker is being taken. She does not see a podiatrist.Eye exam is current (has eye exam later today).  Hyperlipidemia This is a chronic problem. The current episode started more than 1 year ago. The problem is uncontrolled. Recent lipid tests were reviewed and are high. Exacerbating diseases include chronic renal disease, diabetes and obesity. Factors aggravating her hyperlipidemia include fatty foods. Associated symptoms include chest pain (ongoing work up with cardiology). Current antihyperlipidemic treatment includes statins. The current treatment provides moderate improvement of lipids. There are no compliance problems.  Risk factors for coronary artery disease include diabetes mellitus, dyslipidemia, family history, obesity, hypertension, post-menopausal and a sedentary lifestyle.  Hypertension This is a chronic problem. The current episode started more than 1 year ago. The problem has been gradually improving since onset. The problem is controlled. Associated symptoms include chest pain (ongoing work up with cardiology). There are no associated agents to hypertension. Risk factors for coronary artery disease include diabetes mellitus, dyslipidemia, family history, obesity,  post-menopausal state and sedentary lifestyle. Past treatments include ACE inhibitors. The current treatment provides moderate improvement. There are no compliance problems.  Hypertensive end-organ damage includes kidney disease. Identifiable causes of hypertension include chronic renal disease and sleep apnea.     Review of systems  Constitutional: + stable body weight, current Body mass index is 34.93 kg/m., no fatigue, no subjective hyperthermia, no subjective hypothermia Eyes: no blurry vision, no xerophthalmia ENT: no sore throat, no  nodules palpated in throat, no dysphagia/odynophagia, no hoarseness Cardiovascular: no chest pain, no shortness of breath, no palpitations, no leg swelling Respiratory: no cough, no shortness of breath Gastrointestinal: no nausea/vomiting/diarrhea Musculoskeletal: no muscle/joint aches Skin: no rashes, no hyperemia Neurological: no tremors, + numbness/tingling to BLE, no dizziness Psychiatric: no depression, no anxiety  Objective:     BP 111/75 (BP Location: Left Arm, Patient Position: Sitting, Cuff Size: Large)   Pulse 74   Ht 5' 2"  (1.575 m)   Wt 191 lb (86.6 kg)   BMI 34.93 kg/m   Wt Readings from Last 3 Encounters:  07/03/22 191 lb (86.6 kg)  07/02/22 191 lb 9.6 oz (86.9 kg)  05/24/22 188 lb 3.2 oz (85.4 kg)     BP Readings from Last 3 Encounters:  07/03/22 111/75  07/02/22 124/78  05/24/22 122/70     Physical Exam- Limited  Constitutional:  Body mass index is 34.93 kg/m. , not in acute distress, normal state of mind Eyes:  EOMI, no exophthalmos Neck: Supple Cardiovascular: RRR, + murmur, rubs, or gallops, no edema Respiratory: Adequate breathing efforts, no crackles, rales, rhonchi, or wheezing Musculoskeletal: no gross deformities, strength intact in all four extremities, no gross restriction of joint movements Skin:  no rashes, no hyperemia Neurological: no tremor with outstretched hands     Diabetic Foot Exam - Simple    No data filed     CMP ( most recent) CMP     Component Value Date/Time   NA 142 06/05/2022 1142   K 4.6 06/05/2022 1142   CL 106 06/05/2022 1142   CO2 16 (L) 06/05/2022 1142   GLUCOSE 111 (H) 06/05/2022 1142   GLUCOSE 169 (H) 04/05/2022 1335   BUN 13 06/05/2022 1142   CREATININE 0.76 06/05/2022 1142   CALCIUM 9.0 06/05/2022 1142   PROT 6.1 06/05/2022 1142   ALBUMIN 4.1 06/05/2022 1142   AST 18 06/05/2022 1142   ALT 21 06/05/2022 1142   ALKPHOS 86 06/05/2022 1142   BILITOT 0.4 06/05/2022 1142   GFRNONAA >60 04/05/2022 1335     Diabetic Labs (most recent): Lab Results  Component Value Date   HGBA1C 6.0 (H) 06/05/2022   HGBA1C 6.0 (H) 04/05/2022   HGBA1C 6.6 01/07/2022     Lipid Panel ( most recent) Lipid Panel     Component Value Date/Time   CHOL 246 (H) 06/05/2022 1142   TRIG 165 (H) 06/05/2022 1142   HDL 41 06/05/2022 1142   CHOLHDL 6.0 (H) 06/05/2022 1142   LDLCALC 174 (H) 06/05/2022 1142   LABVLDL 31 06/05/2022 1142      Lab Results  Component Value Date   TSH 1.780 06/05/2022   TSH 2.830 10/02/2021   FREET4 1.08 10/02/2021           Assessment & Plan:   1) Type 2 diabetes mellitus with diabetic neuropathy, without long-term current use of insulin ([place])  She presents today with her meter and logs showing mostly at target fasting glycemic profile.  Her previsit A1c on 9/20 checked by her PCP was 6%, improving from last visit of 6.6%.  She is tolerating the Ozempic well.  She denies any hypoglycemia.  - Monique Warren has currently uncontrolled symptomatic type 2 DM since 71 years of age.  -Recent labs reviewed.  - I had a long discussion with her about the progressive nature of diabetes and the pathology behind its complications. -her diabetes is complicated by mild CKD, peripheral neuropathy and she remains at a  high risk for more acute and chronic complications which include CAD, CVA, CKD, retinopathy, and neuropathy. These are all  discussed in detail with her.  - Nutritional counseling repeated at each appointment due to patients tendency to fall back in to old habits.  - The patient admits there is a room for improvement in their diet and drink choices. -  Suggestion is made for the patient to avoid simple carbohydrates from their diet including Cakes, Sweet Desserts / Pastries, Ice Cream, Soda (diet and regular), Sweet Tea, Candies, Chips, Cookies, Sweet Pastries, Store Bought Juices, Alcohol in Excess of 1-2 drinks a day, Artificial Sweeteners, Coffee Creamer, and "Sugar-free" Products. This will help patient to have stable blood glucose profile and potentially avoid unintended weight gain.   - I encouraged the patient to switch to unprocessed or minimally processed complex starch and increased protein intake (animal or plant source), fruits, and vegetables.   - Patient is advised to stick to a routine mealtimes to eat 3 meals a day and avoid unnecessary snacks (to snack only to correct hypoglycemia).  - I have approached her with the following individualized plan to manage her diabetes and patient agrees:   -She is advised to continue her Ozempic 1 mg SQ weekly given her stable glycemic profile.  -she does not need to routinely monitor glucose due to monotherapy with Ozempic only. But I did recommend she at least check her fasting readings during the holidays since we tend to struggle with temptation during that time.  - She does not tolerate Metformin due to GI upset (r/t her Celiac disease) and has allergy to sulfa meds.  - Specific targets for  A1c; LDL, HDL, and Triglycerides were discussed with the patient.  2) Blood Pressure /Hypertension:  her blood pressure is controlled to target.   she is advised to continue her current medications including Lisinopril 20 mg p.o. daily with breakfast.  3) Lipids/Hyperlipidemia:    Her most recent lipid panel from 06/05/22 shows uncontrolled LDL of 174 but she admits she  had not been taking her medications.  Her PCP counseled her on this and advised she restart it which she has.  She is advised to continue Lipitor 40 mg po daily at bedtime.  Side effects and precautions discussed with her.    4)  Weight/Diet:  her Body mass index is 34.93 kg/m.  -  clearly complicating her diabetes care.   she is a candidate for weight loss. I discussed with her the fact that loss of 5 - 10% of her  current body weight will have the most impact on her diabetes management.  Exercise, and detailed carbohydrates information provided  -  detailed on discharge instructions.  5) Chronic Care/Health Maintenance: -she is on ACEI/ARB and Statin medications and is encouraged to initiate and continue to follow up with Ophthalmology, Dentist, Podiatrist at least yearly or according to recommendations, and advised to stay away from smoking. I have recommended yearly flu vaccine and pneumonia vaccine at least every 5 years; moderate intensity exercise for up to 150 minutes weekly; and sleep for at least 7 hours a day.  - she is advised to maintain close follow up with Lindell Spar, MD for primary care needs, as well as her other providers for optimal and coordinated care.      I spent 30 minutes in the care of the patient today including review of labs from [facility], Lipids, Thyroid Function, Hematology (current and previous including abstractions from other facilities);  face-to-face time discussing  her blood glucose readings/logs, discussing hypoglycemia and hyperglycemia episodes and symptoms, medications doses, her options of short and long term treatment based on the latest standards of care / guidelines;  discussion about incorporating lifestyle medicine;  and documenting the encounter. Risk reduction counseling performed per USPSTF guidelines to reduce obesity and cardiovascular risk factors.     Please refer to Patient Instructions for Blood Glucose Monitoring and Insulin/Medications Dosing  Guide"  in media tab for additional information. Please  also refer to " Patient Self Inventory" in the Media  tab for reviewed elements of pertinent patient history.  Monique Warren participated in the discussions, expressed understanding, and voiced agreement with the above plans.  All questions were answered to her satisfaction. she is encouraged to contact clinic should she have any questions or concerns prior to her return visit.     Follow up plan: - Return in about 4 months (around 11/03/2022) for Diabetes F/U with A1c in office, No previsit labs, Bring meter and logs.   Rayetta Pigg, [facility] [address] [place], [place] [address] Phone: [phone] Fax: [phone]  07/03/2022, 4:11 PM

## 2022-07-05 DIAGNOSIS — M25511 Pain in right shoulder: Secondary | ICD-10-CM | POA: Insufficient documentation

## 2022-07-05 NOTE — Assessment & Plan Note (Addendum)
On Aimovig, refilled for now Zolmitriptan as needed for breakthrough headache Her neck pain and occipital area headache is likely from migraine Used to follow-up with neurology in [place]

## 2022-07-05 NOTE — Assessment & Plan Note (Signed)
Lab Results  Component Value Date   HGBA1C 6.0 (H) 06/05/2022   Associated with HTN, GERD and depression Well controlled now On Ozempic, followed by Endocrinology On statin and ACEi Diabetic eye exam: Referred to Ophthalmology On Cymbalta for neuropathy Added gabapentin for chronic pain

## 2022-07-05 NOTE — Progress Notes (Signed)
Acute Office Visit  Subjective:    Patient ID: Monique Warren, female    DOB: [birth year], 71 y.o.   MRN: [id number]  Chief Complaint  Patient presents with   Arm Pain    Pt having right arm shoulder pain and hands are hurting started 9-21 after flu shot    HPI Patient is in today for complaint of right shoulder, arm and hand pain, which is chronic, but worse since getting flu vaccine about 3 weeks ago.  She has had chronic numbness and weakness of the right hand.  She has had carpal tunnel surgery in the past.  She used to take gabapentin 800 mg in the past.  She denies any recent injury or fall.  She also reports neck pain, which is worse with headache/migraine.  She is currently taking Aimovig for migraine PPx, and takes Zomig as needed for breakthrough headache.   Past Medical History:  Diagnosis Date   Anxiety    Arthritis    Asthma    Celiac disease    Depression    Diabetes mellitus, type II ([place])    Diabetic neuropathy ([place])    Diastolic dysfunction    Grade 1 with preserved EF   Dysrhythmia    Heart murmur    Hiatal hernia    Sliding   Hyperlipidemia    Hypertension    Migraine    Obstructive sleep apnea    Osteoporosis    Vertigo     Past Surgical History:  Procedure Laterality Date   ABDOMINAL HYSTERECTOMY  09/29/2003   APPENDECTOMY     BALLOON DILATION N/A 02/08/2022   Procedure: BALLOON DILATION;  Surgeon: Eloise Harman, DO;  [facility];  Service: Endoscopy;  Laterality: N/A;   BIOPSY  02/08/2022   Procedure: BIOPSY;  Surgeon: Eloise Harman, DO;  [facility];  Service: Endoscopy;;   BREAST BIOPSY Left    CARPAL TUNNEL RELEASE     CATARACT EXTRACTION Bilateral    October and November 2016   COLONOSCOPY  09/2017   [name], MD in [place]; 5 mm tubular adenoma in the descending colon s/p resected, mild sigmoid diverticulosis, internal hemorrhoids.  Recommended repeat colonoscopy in 5 years.   CRANIOTOMY FOR  ANEURYSM / VERTEBROBASILAR / CAROTID CIRCULATION Right 10/24/2015   ESOPHAGOGASTRODUODENOSCOPY  09/2017   [name]; irregular Z-line s/p biopsy, decreased LES tone, 4 cm hiatal hernia, erythema in gastric antrum biopsied, normal examined duodenum biopsied.  Esophageal biopsy suggestive of reflux, no Barrett's, gastric biopsy with nonspecific chronic gastritis with intestinal metaplasia, duodenal biopsy with increased intraepithelial lymphocytes without blunting of villi, nonspecific.   ESOPHAGOGASTRODUODENOSCOPY (EGD) WITH PROPOFOL N/A 02/08/2022   Surgeon: Eloise Harman, DO; 2 cm hiatal hernia, short segment Barrett's esophagus without dysplasia, mild Schatzki's ring dilated, gastritis with biopsies benign, normal examined duodenum.  Repeat EGD in 5 years.   KNEE ARTHROSCOPY WITH MEDIAL MENISECTOMY Left 04/09/2022   Procedure: KNEE ARTHROSCOPY WITH PARTIAL MEDIAL MENISCECTOMY, LATERAL MENISCAL DEBRIDEMENT;  Surgeon: Carole Civil, MD;  Location: [facility];  Service: Orthopedics;  Laterality: Left;   KNEE ARTHROSCOPY WITH MENISCAL REPAIR Left 04/09/2022   Procedure: KNEE ARTHROSCOPY WITH MEDIAL MENISCAL REPAIR;  Surgeon: Carole Civil, MD;  Location: [facility];  Service: Orthopedics;  Laterality: Left;   LAPAROSCOPY     UTERINE FIBROID SURGERY      Family History  Problem Relation Age of Onset   Breast cancer Mother    Thyroid disease Mother  Stroke Mother    Hypertension Father    Hyperlipidemia Father    Heart attack Father    Heart failure Father    Breast cancer Paternal Grandmother    Cancer - Colon Neg Hx    Gastric cancer Neg Hx    Esophageal cancer Neg Hx    Liver cancer Neg Hx    Autoimmune disease Neg Hx     Social History   Socioeconomic History   Marital status: Married    Spouse name: Not on file   Number of children: Not on file   Years of education: Not on file   Highest education level: Not on file  Occupational History   Not on file  Tobacco Use    Smoking status: Former    Packs/day: 0.50    Types: Cigarettes   Smokeless tobacco: Never  Vaping Use   Vaping Use: Never used  Substance and Sexual Activity   Alcohol use: Yes    Alcohol/week: 4.0 standard drinks of alcohol    Types: 2 Glasses of wine, 2 Shots of liquor per week    Comment: rarely   Drug use: Never   Sexual activity: Not on file  Other Topics Concern   Not on file  Social History Narrative   Not on file   Social Determinants of Health   Financial Resource Strain: Not on file  Food Insecurity: Not on file  Transportation Needs: Not on file  Physical Activity: Not on file  Stress: Not on file  Social Connections: Not on file  Intimate Partner Violence: Not on file    Outpatient Medications Prior to Visit  Medication Sig Dispense Refill   acetaminophen (TYLENOL) 500 MG tablet Take 1,000 mg by mouth every 6 (six) hours as needed for moderate pain.     AIMOVIG 140 MG/ML SOAJ INJECT 140 MG UNDER THE SKIN EVERY 30 DAYS 1 mL 11   albuterol (VENTOLIN HFA) 108 (90 Base) MCG/ACT inhaler USE 2 INHALATIONS EVERY 6 HOURS AS NEEDED FOR WHEEZING OR SHORTNESS OF BREATH 8.5 g 10   atorvastatin (LIPITOR) 40 MG tablet Take 1 tablet (40 mg total) by mouth at bedtime. 90 tablet 3   buPROPion (WELLBUTRIN XL) 300 MG 24 hr tablet TAKE 1 TABLET DAILY 90 tablet 3   diltiazem (CARDIZEM SR) 120 MG 12 hr capsule Take 2 capsules (240 mg total) by mouth daily. 180 capsule 2   DULoxetine (CYMBALTA) 60 MG capsule Take 1 capsule (60 mg total) by mouth daily. (Patient taking differently: Take 60 mg by mouth at bedtime.) 90 capsule 1   Estradiol 10 MCG TABS vaginal tablet Place 10 mcg vaginally every Monday.     famotidine (PEPCID) 40 MG tablet TAKE 1 TABLET DAILY (Patient taking differently: Take 40 mg by mouth at bedtime.) 90 tablet 3   Fluticasone-Salmeterol,sensor, (AIRDUO DIGIHALER) 232-14 MCG/ACT AEPB Inhale 1 Inhalation into the lungs 2 (two) times daily. 1 each 5   Lancets (ONETOUCH  DELICA PLUS [id number]) MISC Use to check glucose once daily 100 each 6   linaclotide (LINZESS) 72 MCG capsule Take 1 capsule (72 mcg total) by mouth daily before breakfast. 30 capsule 3   metoprolol succinate (TOPROL-XL) 50 MG 24 hr tablet Take 0.5 tablets (25 mg total) by mouth daily for 14 days, THEN 1 tablet (50 mg total) daily. Take with or immediately following a meal.. 90 tablet 3   Multiple Vitamin (MULTIVITAMIN WITH MINERALS) TABS tablet Take 1 tablet by mouth daily.  ONETOUCH ULTRA test strip Use to monitor glucose once daily 100 each 6   OZEMPIC, 1 MG/DOSE, 4 MG/3ML SOPN INJECT 1 MG ONCE A WEEK AS DIRECTED 6 mL 0   PROLIA 60 MG/ML SOSY injection Inject 60 mg into the skin every 6 (six) months. 1 mL 1   RABEprazole (ACIPHEX) 20 MG tablet Take 1 tablet (20 mg total) by mouth daily before breakfast. 30 tablet 3   zolmitriptan (ZOMIG) 5 MG tablet Take 5 mg by mouth as needed.     No facility-administered medications prior to visit.    Allergies  Allergen Reactions   Cefuroxime Anaphylaxis, Hives, Shortness Of Breath, Swelling and Anxiety   Codeine Hives, Shortness Of Breath, Nausea And Vomiting, Swelling and Anxiety   Sulfa Antibiotics Anaphylaxis, Hives, Nausea And Vomiting and Swelling    Swollen throat   Gluten Meal     Severe reflux - celiac disease    Review of Systems  Constitutional:  Negative for chills and fever.  HENT:  Negative for congestion, sinus pressure, sinus pain and sore throat.   Eyes:  Negative for pain and discharge.  Respiratory:  Negative for cough and shortness of breath.   Cardiovascular:  Negative for chest pain and palpitations.  Gastrointestinal:  Positive for constipation. Negative for abdominal pain, diarrhea, nausea and vomiting.  Endocrine: Negative for polydipsia and polyuria.  Genitourinary:  Negative for dysuria and hematuria.  Musculoskeletal:  Positive for arthralgias (Left knee and heel, right shoulder, elbow and hand), back pain and  neck pain. Negative for neck stiffness.  Skin:  Negative for rash.  Neurological:  Positive for weakness (Right hand) and headaches. Negative for dizziness.  Psychiatric/Behavioral:  Negative for agitation and behavioral problems.        Objective:    Physical Exam Vitals reviewed.  Constitutional:      General: She is not in acute distress.    Appearance: She is obese. She is not diaphoretic.  HENT:     Head: Normocephalic and atraumatic.     Nose: Nose normal.     Mouth/Throat:     Mouth: Mucous membranes are moist.  Eyes:     General: No scleral icterus.    Extraocular Movements: Extraocular movements intact.  Cardiovascular:     Rate and Rhythm: Normal rate and regular rhythm.     Pulses: Normal pulses.     Heart sounds: Murmur (Systolic over right upper sternal border) heard.  Pulmonary:     Breath sounds: Normal breath sounds. No wheezing or rales.  Musculoskeletal:     Right shoulder: Decreased range of motion. Decreased strength.     Cervical back: Neck supple. No tenderness.     Right lower leg: No edema.     Left lower leg: No edema.     Comments: ROM limited at left knee due to pain  Skin:    General: Skin is warm.     Findings: No rash.  Neurological:     General: No focal deficit present.     Mental Status: She is alert and oriented to person, place, and time.     Cranial Nerves: No cranial nerve deficit.     Sensory: No sensory deficit.     Motor: No weakness.  Psychiatric:        Mood and Affect: Mood normal.        Behavior: Behavior normal.     BP 124/78 (BP Location: Right Arm, Patient Position: Sitting, Cuff Size: Normal)   Pulse Marland Kitchen)  58   Resp 18   Ht 5' 2"  (1.575 m)   Wt 191 lb 9.6 oz (86.9 kg)   SpO2 97%   BMI 35.04 kg/m  Wt Readings from Last 3 Encounters:  07/03/22 191 lb (86.6 kg)  07/02/22 191 lb 9.6 oz (86.9 kg)  05/24/22 188 lb 3.2 oz (85.4 kg)        Assessment & Plan:   Problem List Items Addressed This Visit        Cardiovascular and Mediastinum   Migraine without aura or status migrainosus    On Aimovig, refilled for now Zolmitriptan as needed for breakthrough headache Her neck pain and occipital area headache is likely from migraine Used to follow-up with neurology in [place]      Relevant Medications   zolmitriptan (ZOMIG) 5 MG tablet   gabapentin (NEURONTIN) 300 MG capsule     Endocrine   Type 2 diabetes mellitus with diabetic neuropathy, unspecified ([place])    Lab Results  Component Value Date   HGBA1C 6.0 (H) 06/05/2022  Associated with HTN, GERD and depression Well controlled now On Ozempic, followed by Endocrinology On statin and ACEi Diabetic eye exam: Referred to Ophthalmology On Cymbalta for neuropathy Added gabapentin for chronic pain      Relevant Medications   gabapentin (NEURONTIN) 300 MG capsule     Nervous and Auditory   Carpal tunnel syndrome of right wrist    Has had carpal tunnel surgery Her hand numbness and weakness is likely due to residual symptoms of carpal tunnel syndrome Advised to use wrist brace at nighttime Started gabapentin for chronic pain      Relevant Medications   gabapentin (NEURONTIN) 300 MG capsule     Musculoskeletal and Integument   Osteoporosis    Gets Prolia, followed by Rheumatology in [place] injection today        Other   Acute pain of right shoulder - Primary    Unlikely related to vaccine administration Has limited ROM of right shoulder, elbow and wrist, likely due to OA Referred to Orthopedic surgery      Relevant Orders   Ambulatory referral to Orthopedic Surgery     Meds ordered this encounter  Medications   denosumab (PROLIA) injection 60 mg    Order Specific Question:   Patient is enrolled in [facility] program for this medication and I have provided a copy of the Prolia Medication Guide and Patient Brochure.    Answer:   Yes    Order Specific Question:   I have reviewed with the patient the information in the  Prolia Medication Guide and Patient Counseling Chart including the serious risks of Prolia and symptoms of each risk.    Answer:   Yes    Order Specific Question:   I have advised the patient to seek medical attention if they have signs or symptoms of any of the serious risks.    Answer:   Yes   gabapentin (NEURONTIN) 300 MG capsule    Sig: Take 1 capsule (300 mg total) by mouth at bedtime.    Dispense:  30 capsule    Refill:  2     Genesi Stefanko Keith Rake, MD

## 2022-07-05 NOTE — Assessment & Plan Note (Signed)
Gets Prolia, followed by Rheumatology in [place] injection today

## 2022-07-05 NOTE — Assessment & Plan Note (Signed)
Unlikely related to vaccine administration Has limited ROM of right shoulder, elbow and wrist, likely due to OA Referred to Orthopedic surgery

## 2022-07-05 NOTE — Assessment & Plan Note (Signed)
Has had carpal tunnel surgery Her hand numbness and weakness is likely due to residual symptoms of carpal tunnel syndrome Advised to use wrist brace at nighttime Started gabapentin for chronic pain

## 2022-07-15 ENCOUNTER — Other Ambulatory Visit: Payer: Self-pay | Admitting: Nurse Practitioner

## 2022-07-16 ENCOUNTER — Ambulatory Visit: Payer: 59 | Admitting: Orthopedic Surgery

## 2022-07-16 ENCOUNTER — Ambulatory Visit (INDEPENDENT_AMBULATORY_CARE_PROVIDER_SITE_OTHER): Payer: 59

## 2022-07-16 ENCOUNTER — Encounter: Payer: Self-pay | Admitting: Orthopedic Surgery

## 2022-07-16 VITALS — BP 122/62 | HR 57 | Ht 62.0 in | Wt 190.0 lb

## 2022-07-16 DIAGNOSIS — M25511 Pain in right shoulder: Secondary | ICD-10-CM

## 2022-07-16 DIAGNOSIS — G8929 Other chronic pain: Secondary | ICD-10-CM

## 2022-07-16 MED ORDER — METHYLPREDNISOLONE ACETATE 40 MG/ML IJ SUSP
40.0000 mg | Freq: Once | INTRAMUSCULAR | Status: AC
  Administered 2022-07-16: 40 mg via INTRA_ARTICULAR

## 2022-07-16 NOTE — Patient Instructions (Addendum)

## 2022-07-17 NOTE — Progress Notes (Signed)
New Patient Visit  Assessment: Monique Warren is a 71 y.o. female with the following: 1. Acute right shoulder pain  Plan: [name] has pain in her right shoulder.  Acute onset.  Atraumatic.  She has difficulty with overhead motion.  I recommended a steroid injection, and she elected to proceed.  This was completed in clinic today without issues.  She will follow-up as needed.  Procedure note injection - Right shoulder    Verbal consent was obtained to inject the right shoulder, subacromial space Timeout was completed to confirm the site of injection.   The skin was prepped with alcohol and ethyl chloride was sprayed at the injection site.  A 21-gauge needle was used to inject 40 mg of Depo-Medrol and 1% lidocaine (3 cc) into the subacromial space of the right shoulder using a posterolateral approach.  There were no complications.  A sterile bandage was applied.    Follow-up: Return if symptoms worsen or fail to improve.  Subjective:  Chief Complaint  Patient presents with   Shoulder Pain    Right x 3weeks started after flu shot no injury     History of Present Illness: Monique Warren is a 71 y.o. female who presents for evaluation of right shoulder pain.  Said pain in the shoulder for the past few weeks.  It started after receiving a flu shot in the right arm.  No prior injuries to her right shoulder.  Pain is over the lateral aspect of shoulder.  She has difficulty with overhead motion.  No prior injections.  She has not worked with physical therapy.   Review of Systems: No fevers or chills No numbness or tingling No chest pain No shortness of breath No bowel or bladder dysfunction No GI distress No headaches   Medical History:  Past Medical History:  Diagnosis Date   Anxiety    Arthritis    Asthma    Celiac disease    Depression    Diabetes mellitus, type II ([place])    Diabetic neuropathy ([facility])    Diastolic dysfunction    Grade 1 with preserved  EF   Dysrhythmia    Heart murmur    Hiatal hernia    Sliding   Hyperlipidemia    Hypertension    Migraine    Obstructive sleep apnea    Osteoporosis    Vertigo     Past Surgical History:  Procedure Laterality Date   ABDOMINAL HYSTERECTOMY  09/29/2003   APPENDECTOMY     BALLOON DILATION N/A 02/08/2022   Procedure: BALLOON DILATION;  Surgeon: Eloise Harman, DO;  [facility];  Service: Endoscopy;  Laterality: N/A;   BIOPSY  02/08/2022   Procedure: BIOPSY;  Surgeon: Eloise Harman, DO;  [facility];  Service: Endoscopy;;   BREAST BIOPSY Left    CARPAL TUNNEL RELEASE     CATARACT EXTRACTION Bilateral    October and November 2016   COLONOSCOPY  09/2017   [name], MD in [place]; 5 mm tubular adenoma in the descending colon s/p resected, mild sigmoid diverticulosis, internal hemorrhoids.  Recommended repeat colonoscopy in 5 years.   CRANIOTOMY FOR ANEURYSM / VERTEBROBASILAR / CAROTID CIRCULATION Right 10/24/2015   ESOPHAGOGASTRODUODENOSCOPY  09/2017   [name]; irregular Z-line s/p biopsy, decreased LES tone, 4 cm hiatal hernia, erythema in gastric antrum biopsied, normal examined duodenum biopsied.  Esophageal biopsy suggestive of reflux, no Barrett's, gastric biopsy with nonspecific chronic gastritis with intestinal metaplasia, duodenal biopsy  with increased intraepithelial lymphocytes without blunting of villi, nonspecific.   ESOPHAGOGASTRODUODENOSCOPY (EGD) WITH PROPOFOL N/A 02/08/2022   Surgeon: Eloise Harman, DO; 2 cm hiatal hernia, short segment Barrett's esophagus without dysplasia, mild Schatzki's ring dilated, gastritis with biopsies benign, normal examined duodenum.  Repeat EGD in 5 years.   KNEE ARTHROSCOPY WITH MEDIAL MENISECTOMY Left 04/09/2022   Procedure: KNEE ARTHROSCOPY WITH PARTIAL MEDIAL MENISCECTOMY, LATERAL MENISCAL DEBRIDEMENT;  Surgeon: Carole Civil, MD;  Location[facility];  Service: Orthopedics;  Laterality: Left;   KNEE  ARTHROSCOPY WITH MENISCAL REPAIR Left 04/09/2022   Procedure: KNEE ARTHROSCOPY WITH MEDIAL MENISCAL REPAIR;  Surgeon: Carole Civil, MD;  Location: [facility];  Service: Orthopedics;  Laterality: Left;   LAPAROSCOPY     UTERINE FIBROID SURGERY      Family History  Problem Relation Age of Onset   Breast cancer Mother    Thyroid disease Mother    Stroke Mother    Hypertension Father    Hyperlipidemia Father    Heart attack Father    Heart failure Father    Breast cancer Paternal Grandmother    Cancer - Colon Neg Hx    Gastric cancer Neg Hx    Esophageal cancer Neg Hx    Liver cancer Neg Hx    Autoimmune disease Neg Hx    Social History   Tobacco Use   Smoking status: Former    Packs/day: 0.50    Types: Cigarettes   Smokeless tobacco: Never  Vaping Use   Vaping Use: Never used  Substance Use Topics   Alcohol use: Yes    Alcohol/week: 4.0 standard drinks of alcohol    Types: 2 Glasses of wine, 2 Shots of liquor per week    Comment: rarely   Drug use: Never    Allergies  Allergen Reactions   Cefuroxime Anaphylaxis, Hives, Shortness Of Breath, Swelling and Anxiety   Codeine Hives, Shortness Of Breath, Nausea And Vomiting, Swelling and Anxiety   Sulfa Antibiotics Anaphylaxis, Hives, Nausea And Vomiting and Swelling    Swollen throat   Gluten Meal     Severe reflux - celiac disease    Current Meds  Medication Sig   acetaminophen (TYLENOL) 500 MG tablet Take 1,000 mg by mouth every 6 (six) hours as needed for moderate pain.   AIMOVIG 140 MG/ML SOAJ INJECT 140 MG UNDER THE SKIN EVERY 30 DAYS   albuterol (VENTOLIN HFA) 108 (90 Base) MCG/ACT inhaler USE 2 INHALATIONS EVERY 6 HOURS AS NEEDED FOR WHEEZING OR SHORTNESS OF BREATH   atorvastatin (LIPITOR) 40 MG tablet Take 1 tablet (40 mg total) by mouth at bedtime.   buPROPion (WELLBUTRIN XL) 300 MG 24 hr tablet TAKE 1 TABLET DAILY   diltiazem (CARDIZEM SR) 120 MG 12 hr capsule Take 2 capsules (240 mg total) by mouth daily.    DULoxetine (CYMBALTA) 60 MG capsule Take 1 capsule (60 mg total) by mouth daily. (Patient taking differently: Take 60 mg by mouth at bedtime.)   Estradiol 10 MCG TABS vaginal tablet Place 10 mcg vaginally every Monday.   famotidine (PEPCID) 40 MG tablet TAKE 1 TABLET DAILY (Patient taking differently: Take 40 mg by mouth at bedtime.)   Fluticasone-Salmeterol,sensor, (AIRDUO DIGIHALER) 232-14 MCG/ACT AEPB Inhale 1 Inhalation into the lungs 2 (two) times daily.   gabapentin (NEURONTIN) 300 MG capsule Take 1 capsule (300 mg total) by mouth at bedtime.   Lancets (ONETOUCH DELICA PLUS [id number]) MISC Use to check glucose once daily   linaclotide Rolan Lipa)  72 MCG capsule Take 1 capsule (72 mcg total) by mouth daily before breakfast.   metoprolol succinate (TOPROL-XL) 50 MG 24 hr tablet Take 0.5 tablets (25 mg total) by mouth daily for 14 days, THEN 1 tablet (50 mg total) daily. Take with or immediately following a meal..   Multiple Vitamin (MULTIVITAMIN WITH MINERALS) TABS tablet Take 1 tablet by mouth daily.   ONETOUCH ULTRA test strip Use to monitor glucose once daily   OZEMPIC, 1 MG/DOSE, 4 MG/3ML SOPN INJECT 1 MG ONCE A WEEK AS DIRECTED   PROLIA 60 MG/ML SOSY injection Inject 60 mg into the skin every 6 (six) months.   RABEprazole (ACIPHEX) 20 MG tablet Take 1 tablet (20 mg total) by mouth daily before breakfast.   zolmitriptan (ZOMIG) 5 MG tablet Take 5 mg by mouth as needed.    Objective: BP 122/62   Pulse (!) 57   Ht 5' 2"  (1.575 m)   Wt 190 lb (86.2 kg)   BMI 34.75 kg/m   Physical Exam:  General: Elderly female. Gait: Normal gait.  Right shoulder without swelling.  No atrophy.  No deformity.  Restricted forward flexion.  Internal rotation to her back pocket.  Positive Neer's.  Fingers are warm and well-perfused.  2+ radial pulse.   IMAGING: I personally ordered and reviewed the following images  X-rays of the right shoulder obtained in clinic today.  No acute injuries are  noted.  Mild loss of glenohumeral joint space, with some associated inferior osteophytes.  No evidence of proximal humeral migration.  Narrowing at the [facility] joint, small osteophytes.  Impression: Right shoulder x-rays with mild degenerative changes of both glenohumeral joint, as well as the AC joint.   New Medications:  Meds ordered this encounter  Medications   methylPREDNISolone acetate (DEPO-MEDROL) injection 40 mg      Mordecai Rasmussen, MD  07/17/2022 1:35 PM

## 2022-07-29 ENCOUNTER — Other Ambulatory Visit: Payer: Self-pay

## 2022-07-29 DIAGNOSIS — E1140 Type 2 diabetes mellitus with diabetic neuropathy, unspecified: Secondary | ICD-10-CM

## 2022-07-29 MED ORDER — GABAPENTIN 300 MG PO CAPS: 300.0000 mg | ORAL_CAPSULE | Freq: Every day | ORAL | 3 refills | Status: DC

## 2022-08-05 ENCOUNTER — Other Ambulatory Visit: Payer: Self-pay | Admitting: Internal Medicine

## 2022-08-05 DIAGNOSIS — E1140 Type 2 diabetes mellitus with diabetic neuropathy, unspecified: Secondary | ICD-10-CM

## 2022-08-21 ENCOUNTER — Ambulatory Visit: Payer: 59 | Admitting: Gastroenterology

## 2022-08-30 ENCOUNTER — Other Ambulatory Visit: Payer: Self-pay | Admitting: Gastroenterology

## 2022-08-30 DIAGNOSIS — K5904 Chronic idiopathic constipation: Secondary | ICD-10-CM

## 2022-08-30 DIAGNOSIS — K219 Gastro-esophageal reflux disease without esophagitis: Secondary | ICD-10-CM

## 2022-09-09 ENCOUNTER — Other Ambulatory Visit: Payer: Self-pay | Admitting: Nurse Practitioner

## 2022-09-20 ENCOUNTER — Other Ambulatory Visit: Payer: Self-pay | Admitting: Internal Medicine

## 2022-09-20 DIAGNOSIS — J4540 Moderate persistent asthma, uncomplicated: Secondary | ICD-10-CM

## 2022-09-30 ENCOUNTER — Other Ambulatory Visit: Payer: Self-pay | Admitting: Internal Medicine

## 2022-10-01 ENCOUNTER — Other Ambulatory Visit (HOSPITAL_COMMUNITY): Payer: Self-pay | Admitting: Internal Medicine

## 2022-10-01 DIAGNOSIS — Z1231 Encounter for screening mammogram for malignant neoplasm of breast: Secondary | ICD-10-CM

## 2022-10-02 ENCOUNTER — Ambulatory Visit (HOSPITAL_COMMUNITY)
Admission: RE | Admit: 2022-10-02 | Discharge: 2022-10-02 | Disposition: A | Discharge status: Home / Self Care | Payer: 59 | Source: Ambulatory Visit | Attending: Internal Medicine | Admitting: Internal Medicine

## 2022-10-02 ENCOUNTER — Ambulatory Visit: Payer: 59 | Admitting: Internal Medicine

## 2022-10-02 ENCOUNTER — Encounter (HOSPITAL_COMMUNITY): Payer: Self-pay

## 2022-10-02 ENCOUNTER — Inpatient Hospital Stay (HOSPITAL_COMMUNITY): Admission: RE | Admit: 2022-10-02 | Payer: 59 | Source: Ambulatory Visit

## 2022-10-02 ENCOUNTER — Encounter: Payer: Self-pay | Admitting: Internal Medicine

## 2022-10-02 VITALS — BP 118/69 | HR 80 | Ht 62.0 in | Wt 194.4 lb

## 2022-10-02 DIAGNOSIS — R5382 Chronic fatigue, unspecified: Secondary | ICD-10-CM

## 2022-10-02 DIAGNOSIS — E1140 Type 2 diabetes mellitus with diabetic neuropathy, unspecified: Secondary | ICD-10-CM | POA: Diagnosis not present

## 2022-10-02 DIAGNOSIS — I10 Essential (primary) hypertension: Secondary | ICD-10-CM | POA: Diagnosis not present

## 2022-10-02 DIAGNOSIS — M810 Age-related osteoporosis without current pathological fracture: Secondary | ICD-10-CM

## 2022-10-02 DIAGNOSIS — F331 Major depressive disorder, recurrent, moderate: Secondary | ICD-10-CM

## 2022-10-02 DIAGNOSIS — Z1231 Encounter for screening mammogram for malignant neoplasm of breast: Secondary | ICD-10-CM

## 2022-10-02 DIAGNOSIS — G43009 Migraine without aura, not intractable, without status migrainosus: Secondary | ICD-10-CM

## 2022-10-02 DIAGNOSIS — G4733 Obstructive sleep apnea (adult) (pediatric): Secondary | ICD-10-CM

## 2022-10-02 MED ORDER — VENLAFAXINE HCL 37.5 MG PO TABS: 37.5000 mg | ORAL_TABLET | Freq: Two times a day (BID) | ORAL | 3 refills | Status: DC

## 2022-10-02 NOTE — Assessment & Plan Note (Addendum)
On Aimovig, refilled for now Zolmitriptan as needed for breakthrough headache Her neck pain and occipital area headache is likely from migraine Used to follow-up with neurology in [place] Referred to Neurology

## 2022-10-02 NOTE — Progress Notes (Signed)
Established Patient Office Visit  Subjective:  Patient ID: Monique Warren, female    DOB: Jan 29, 1951  Age: 72 y.o. MRN: [id number]  CC:  Chief Complaint  Patient presents with   Hypertension    Patient is following up for her hypertension and diabetes. She states she has been dizzy lately and more exhausted    HPI Monique Warren is a 72 y.o. female with past medical history of hypertension, cerebral artery aneurysm s/p clip, migraine, asthma, OSA, celiac disease, GERD, type II DM, osteoporosis, depression with anxiety and obesity who presents for f/u of her chronic medical conditions.  HTN: BP is well-controlled. Takes medications regularly. Patient denies headache, dizziness, chest pain, dyspnea or palpitations.   Type II DM: Her HbA1C was 6.0 in 09/23. Her Ozempic dose was increased by endocrinology to 1 mg qw.  Her blood glucose remains around 120-150 most of the time.  She denies any polyuria or polydipsia.  MDD: She has been feeling a fatigue, decreased concentration, fatigue and insomnia despite taking with Wellbutrin 300 mg and Cymbalta 60 mg daily.  Denies any SI or HI currently.  Migraine: Takes Aimovig and feels better now. Takes zolmitriptan as needed for breakthrough headache.   OSA: She had sleep study done in 05/20 and was given CPAP device, but has had difficulty with its mask.  She has chronic fatigue and daytime hypersomnolence currently.  Chronic fatigue: She has chronic fatigue, worse for the past few months.  Denies any fever, chills, chronic cough, recent weight loss, night sweats or LAD.  She has OSA, likely untreated.  She is also placed on beta-blocker for tachycardia in addition to Cardizem.  She has not had episodes of dizziness.  She has not had cardiology visit since adding Cardizem.    Past Medical History:  Diagnosis Date   Anxiety    Arthritis    Asthma    Celiac disease    Depression    Diabetes mellitus, type II ([place])    Diabetic neuropathy  ([place])    Diastolic dysfunction    Grade 1 with preserved EF   Dysrhythmia    Heart murmur    Hiatal hernia    Sliding   Hyperlipidemia    Hypertension    Migraine    Obstructive sleep apnea    Osteoporosis    Vertigo     Past Surgical History:  Procedure Laterality Date   ABDOMINAL HYSTERECTOMY  09/29/2003   APPENDECTOMY     BALLOON DILATION N/A 02/08/2022   Procedure: BALLOON DILATION;  Surgeon: Eloise Harman, DO;  [facility];  Service: Endoscopy;  Laterality: N/A;   BIOPSY  02/08/2022   Procedure: BIOPSY;  Surgeon: Eloise Harman, DO;  Location: [facility];  Service: Endoscopy;;   BREAST BIOPSY Left    CARPAL TUNNEL RELEASE     CATARACT EXTRACTION Bilateral    October and November 2016   COLONOSCOPY  09/2017   [name], MD in [place]; 5 mm tubular adenoma in the descending colon s/p resected, mild sigmoid diverticulosis, internal hemorrhoids.  Recommended repeat colonoscopy in 5 years.   CRANIOTOMY FOR ANEURYSM / VERTEBROBASILAR / CAROTID CIRCULATION Right 10/24/2015   ESOPHAGOGASTRODUODENOSCOPY  09/2017   [name]; irregular Z-line s/p biopsy, decreased LES tone, 4 cm hiatal hernia, erythema in gastric antrum biopsied, normal examined duodenum biopsied.  Esophageal biopsy suggestive of reflux, no Barrett's, gastric biopsy with nonspecific chronic gastritis with intestinal metaplasia, duodenal biopsy with increased intraepithelial lymphocytes without  blunting of villi, nonspecific.   ESOPHAGOGASTRODUODENOSCOPY (EGD) WITH PROPOFOL N/A 02/08/2022   Surgeon: Eloise Harman, DO; 2 cm hiatal hernia, short segment Barrett's esophagus without dysplasia, mild Schatzki's ring dilated, gastritis with biopsies benign, normal examined duodenum.  Repeat EGD in 5 years.   KNEE ARTHROSCOPY WITH MEDIAL MENISECTOMY Left 04/09/2022   Procedure: KNEE ARTHROSCOPY WITH PARTIAL MEDIAL MENISCECTOMY, LATERAL MENISCAL DEBRIDEMENT;  Surgeon: Carole Civil, MD;   Location: [facility];  Service: Orthopedics;  Laterality: Left;   KNEE ARTHROSCOPY WITH MENISCAL REPAIR Left 04/09/2022   Procedure: KNEE ARTHROSCOPY WITH MEDIAL MENISCAL REPAIR;  Surgeon: Carole Civil, MD;  Location: [facility];  Service: Orthopedics;  Laterality: Left;   LAPAROSCOPY     UTERINE FIBROID SURGERY      Family History  Problem Relation Age of Onset   Breast cancer Mother    Thyroid disease Mother    Stroke Mother    Hypertension Father    Hyperlipidemia Father    Heart attack Father    Heart failure Father    Breast cancer Paternal Grandmother    Cancer - Colon Neg Hx    Gastric cancer Neg Hx    Esophageal cancer Neg Hx    Liver cancer Neg Hx    Autoimmune disease Neg Hx     Social History   Socioeconomic History   Marital status: Married    Spouse name: Not on file   Number of children: Not on file   Years of education: Not on file   Highest education level: Not on file  Occupational History   Not on file  Tobacco Use   Smoking status: Former    Packs/day: 0.50    Types: Cigarettes   Smokeless tobacco: Never  Vaping Use   Vaping Use: Never used  Substance and Sexual Activity   Alcohol use: Yes    Alcohol/week: 4.0 standard drinks of alcohol    Types: 2 Glasses of wine, 2 Shots of liquor per week    Comment: rarely   Drug use: Never   Sexual activity: Not on file  Other Topics Concern   Not on file  Social History Narrative   Not on file   Social Determinants of Health   Financial Resource Strain: Not on file  Food Insecurity: Not on file  Transportation Needs: Not on file  Physical Activity: Not on file  Stress: Not on file  Social Connections: Not on file  Intimate Partner Violence: Not on file    Outpatient Medications Prior to Visit  Medication Sig Dispense Refill   acetaminophen (TYLENOL) 500 MG tablet Take 1,000 mg by mouth every 6 (six) hours as needed for moderate pain.     AIMOVIG 140 MG/ML SOAJ INJECT 140 MG UNDER THE SKIN  EVERY 30 DAYS 1 mL 11   AIRDUO DIGIHALER 232-14 MCG/ACT AEPB USE 1 INHALATION TWICE A DAY 1 each 11   albuterol (VENTOLIN HFA) 108 (90 Base) MCG/ACT inhaler USE 2 INHALATIONS EVERY 6 HOURS AS NEEDED FOR WHEEZING OR SHORTNESS OF BREATH 8.5 g 10   atorvastatin (LIPITOR) 40 MG tablet Take 1 tablet (40 mg total) by mouth at bedtime. 90 tablet 3   diltiazem (CARDIZEM SR) 120 MG 12 hr capsule Take 2 capsules (240 mg total) by mouth daily. 180 capsule 2   DULoxetine (CYMBALTA) 60 MG capsule TAKE 1 CAPSULE DAILY 90 capsule 3   Estradiol 10 MCG TABS vaginal tablet Place 10 mcg vaginally every Monday.     famotidine (  PEPCID) 40 MG tablet TAKE 1 TABLET DAILY 90 tablet 3   gabapentin (NEURONTIN) 300 MG capsule Take 1 capsule (300 mg total) by mouth at bedtime. 90 capsule 3   Lancets (ONETOUCH DELICA PLUS 123XX123) MISC Use to check glucose once daily 100 each 6   LINZESS 72 MCG capsule TAKE 1 CAPSULE DAILY BEFORE BREAKFAST 30 capsule 11   metoprolol succinate (TOPROL-XL) 50 MG 24 hr tablet Take 0.5 tablets (25 mg total) by mouth daily for 14 days, THEN 1 tablet (50 mg total) daily. Take with or immediately following a meal.. 90 tablet 3   Multiple Vitamin (MULTIVITAMIN WITH MINERALS) TABS tablet Take 1 tablet by mouth daily.     ONETOUCH ULTRA test strip Use to monitor glucose once daily 100 each 6   OZEMPIC, 1 MG/DOSE, 4 MG/3ML SOPN INJECT 1 MG ONCE A WEEK AS DIRECTED 6 mL 5   PROLIA 60 MG/ML SOSY injection Inject 60 mg into the skin every 6 (six) months. 1 mL 1   RABEprazole (ACIPHEX) 20 MG tablet TAKE 1 TABLET DAILY BEFORE BREAKFAST 30 tablet 11   zolmitriptan (ZOMIG) 5 MG tablet Take 5 mg by mouth as needed.     buPROPion (WELLBUTRIN XL) 300 MG 24 hr tablet TAKE 1 TABLET DAILY 90 tablet 3   No facility-administered medications prior to visit.    Allergies  Allergen Reactions   Cefuroxime Anaphylaxis, Hives, Shortness Of Breath, Swelling and Anxiety   Codeine Hives, Shortness Of Breath, Nausea And  Vomiting, Swelling and Anxiety   Sulfa Antibiotics Anaphylaxis, Hives, Nausea And Vomiting and Swelling    Swollen throat   Gluten Meal     Severe reflux - celiac disease    ROS Review of Systems  Constitutional:  Positive for fatigue. Negative for chills and fever.  HENT:  Negative for congestion, sinus pressure, sinus pain and sore throat.   Eyes:  Negative for pain and discharge.  Respiratory:  Negative for cough and shortness of breath.   Cardiovascular:  Negative for chest pain and palpitations.  Gastrointestinal:  Positive for constipation. Negative for abdominal pain, diarrhea, nausea and vomiting.  Endocrine: Negative for polydipsia and polyuria.  Genitourinary:  Negative for dysuria and hematuria.  Musculoskeletal:  Positive for arthralgias (Left knee and heel, right shoulder, elbow and hand), back pain and neck pain. Negative for neck stiffness.  Skin:  Negative for rash.  Neurological:  Positive for weakness (Right hand) and headaches. Negative for dizziness.  Psychiatric/Behavioral:  Positive for decreased concentration, dysphoric mood and sleep disturbance. Negative for agitation and behavioral problems.       Objective:    Physical Exam Vitals reviewed.  Constitutional:      General: She is not in acute distress.    Appearance: She is obese. She is not diaphoretic.  HENT:     Head: Normocephalic and atraumatic.     Nose: Nose normal.     Mouth/Throat:     Mouth: Mucous membranes are moist.  Eyes:     General: No scleral icterus.    Extraocular Movements: Extraocular movements intact.  Cardiovascular:     Rate and Rhythm: Normal rate and regular rhythm.     Pulses: Normal pulses.     Heart sounds: Murmur (Systolic over right upper sternal border) heard.  Pulmonary:     Breath sounds: Normal breath sounds. No wheezing or rales.  Musculoskeletal:     Right shoulder: Decreased range of motion. Decreased strength.     Cervical back: Neck  supple. No tenderness.      Right lower leg: No edema.     Left lower leg: No edema.     Comments: ROM limited at left knee due to pain  Skin:    General: Skin is warm.     Findings: No rash.  Neurological:     General: No focal deficit present.     Mental Status: She is alert and oriented to person, place, and time.     Cranial Nerves: No cranial nerve deficit.     Sensory: No sensory deficit.     Motor: No weakness.  Psychiatric:        Mood and Affect: Mood normal.        Behavior: Behavior normal.     BP 118/69 (BP Location: Left Arm, Patient Position: Sitting, Cuff Size: Large)   Pulse 80   Ht 5\' 2"  (1.575 m)   Wt 194 lb 6.4 oz (88.2 kg)   SpO2 96%   BMI 35.56 kg/m  Wt Readings from Last 3 Encounters:  10/02/22 194 lb 6.4 oz (88.2 kg)  07/16/22 190 lb (86.2 kg)  07/03/22 191 lb (86.6 kg)    Lab Results  Component Value Date   TSH 1.780 06/05/2022   Lab Results  Component Value Date   WBC 6.5 06/05/2022   HGB 15.4 06/05/2022   HCT 45.4 06/05/2022   MCV 93 06/05/2022   PLT 315 06/05/2022   Lab Results  Component Value Date   NA 142 06/05/2022   K 4.6 06/05/2022   CO2 16 (L) 06/05/2022   GLUCOSE 111 (H) 06/05/2022   BUN 13 06/05/2022   CREATININE 0.76 06/05/2022   BILITOT 0.4 06/05/2022   ALKPHOS 86 06/05/2022   AST 18 06/05/2022   ALT 21 06/05/2022   PROT 6.1 06/05/2022   ALBUMIN 4.1 06/05/2022   CALCIUM 9.0 06/05/2022   ANIONGAP 8 04/05/2022   EGFR 84 06/05/2022   Lab Results  Component Value Date   CHOL 246 (H) 06/05/2022   Lab Results  Component Value Date   HDL 41 06/05/2022   Lab Results  Component Value Date   LDLCALC 174 (H) 06/05/2022   Lab Results  Component Value Date   TRIG 165 (H) 06/05/2022   Lab Results  Component Value Date   CHOLHDL 6.0 (H) 06/05/2022   Lab Results  Component Value Date   HGBA1C 6.0 (H) 06/05/2022      Assessment & Plan:   Problem List Items Addressed This Visit       Cardiovascular and Mediastinum   Migraine  without aura or status migrainosus    On Aimovig, refilled for now Zolmitriptan as needed for breakthrough headache Her neck pain and occipital area headache is likely from migraine Used to follow-up with neurology in [place] Referred to Neurology      Relevant Medications   venlafaxine (EFFEXOR) 37.5 MG tablet   Other Relevant Orders   Ambulatory referral to Neurology   HTN (hypertension) - Primary    BP Readings from Last 1 Encounters:  10/02/22 118/69  Usually well-controlled with lisinopril Counseled for compliance with the medications Advised DASH diet and moderate exercise/walking, at least 150 mins/week        Respiratory   Obstructive sleep apnea    Uses CPAP, has difficulty with mask Referred to Neurology for OSA evaluation and migraine      Relevant Orders   Ambulatory referral to Neurology     Endocrine   Type 2 diabetes  mellitus with diabetic neuropathy, unspecified (HCC)    Lab Results  Component Value Date   HGBA1C 6.0 (H) 06/05/2022  Associated with HTN, GERD and depression Well controlled now On Ozempic, followed by Endocrinology On statin and ACEi Diabetic eye exam: Referred to Ophthalmology On Cymbalta for neuropathy Added gabapentin for chronic pain        Musculoskeletal and Integument   Osteoporosis    Gets Prolia, followed by Rheumatology in [place] Gets Prolia injection        Other   MDD (major depressive disorder), recurrent episode (HCC)    Uncontrolled On Wellbutrin and Cymbalta (for back pain) Switched from Wellbutrin to Effexor - taper Wellbutrin over the next 2 weeks      Relevant Medications   venlafaxine (EFFEXOR) 37.5 MG tablet   Chronic fatigue    Could be from MDD and/or beta blocker Adjusted MDD regimen If persistent, will have to discuss discontinuing metoprolol       Meds ordered this encounter  Medications   venlafaxine (EFFEXOR) 37.5 MG tablet    Sig: Take 1 tablet (37.5 mg total) by mouth 2 (two) times  daily.    Dispense:  60 tablet    Refill:  3    Follow-up: Return in about 3 months (around 01/01/2023) for MDD and OSA.    Anabel Halon, MD

## 2022-10-02 NOTE — Assessment & Plan Note (Signed)
Lab Results  Component Value Date   HGBA1C 6.0 (H) 06/05/2022   Associated with HTN, GERD and depression Well controlled now On Ozempic, followed by Endocrinology On statin and ACEi Diabetic eye exam: Referred to Ophthalmology On Cymbalta for neuropathy Added gabapentin for chronic pain 

## 2022-10-02 NOTE — Assessment & Plan Note (Signed)
Could be from MDD and/or beta blocker Adjusted MDD regimen If persistent, will have to discuss discontinuing metoprolol

## 2022-10-02 NOTE — Patient Instructions (Signed)
Please start taking Wellbutrin 1/2 tablet once daily for 1 week, then take 1/2 tablet every other day for 1 week. Start taking Effexor after 2 weeks.  Please continue taking other medications as prescribed.  Please continue to follow low carb diet and ambulate as tolerated.

## 2022-10-02 NOTE — Assessment & Plan Note (Addendum)
Uncontrolled On Wellbutrin and Cymbalta (for back pain) Switched from Wellbutrin to Effexor - taper Wellbutrin over the next 2 weeks

## 2022-10-02 NOTE — Assessment & Plan Note (Signed)
Gets Prolia, followed by Rheumatology in [facility] injection

## 2022-10-02 NOTE — Assessment & Plan Note (Signed)
BP Readings from Last 1 Encounters:  10/02/22 118/69   Usually well-controlled with lisinopril Counseled for compliance with the medications Advised DASH diet and moderate exercise/walking, at least 150 mins/week

## 2022-10-02 NOTE — Assessment & Plan Note (Signed)
Uses CPAP, has difficulty with mask Referred to Neurology for OSA evaluation and migraine

## 2022-10-03 ENCOUNTER — Ambulatory Visit (INDEPENDENT_AMBULATORY_CARE_PROVIDER_SITE_OTHER): Payer: 59

## 2022-10-03 ENCOUNTER — Ambulatory Visit: Payer: 59 | Admitting: Internal Medicine

## 2022-10-03 ENCOUNTER — Ambulatory Visit: Payer: 59 | Admitting: Orthopedic Surgery

## 2022-10-03 ENCOUNTER — Encounter: Payer: Self-pay | Admitting: Orthopedic Surgery

## 2022-10-03 VITALS — Ht 62.0 in | Wt 194.0 lb

## 2022-10-03 DIAGNOSIS — Z9889 Other specified postprocedural states: Secondary | ICD-10-CM | POA: Diagnosis not present

## 2022-10-03 DIAGNOSIS — M25511 Pain in right shoulder: Secondary | ICD-10-CM | POA: Diagnosis not present

## 2022-10-03 DIAGNOSIS — M1712 Unilateral primary osteoarthritis, left knee: Secondary | ICD-10-CM

## 2022-10-03 DIAGNOSIS — Z01818 Encounter for other preprocedural examination: Secondary | ICD-10-CM

## 2022-10-03 MED ORDER — METHYLPREDNISOLONE ACETATE 40 MG/ML IJ SUSP
40.0000 mg | Freq: Once | INTRAMUSCULAR | Status: AC
  Administered 2022-10-03: 40 mg via INTRA_ARTICULAR

## 2022-10-03 MED ORDER — BUPIVACAINE-MELOXICAM ER 400-12 MG/14ML IJ SOLN: 400.0000 mg | Freq: Once | INTRAMUSCULAR | Status: AC

## 2022-10-03 NOTE — Addendum Note (Signed)
Addended byCandice Camp on: 10/03/2022 03:01 PM   Modules accepted: Orders

## 2022-10-03 NOTE — Addendum Note (Signed)
Addended by: Elizabeth Sauer on: 10/03/2022 03:51 PM   Modules accepted: Orders

## 2022-10-03 NOTE — Progress Notes (Signed)
FOLLOW UP   Encounter Diagnosis  Name Primary?   S/P left knee arthroscopy Yes     Chief Complaint  Patient presents with   Knee Pain    Lt knee still having pain     PRIOR TREATMENT: Prior arthroscopic surgery left knee in July 23   status post partial lateral meniscectomy partial medial meniscectomy posterior horn medial meniscus repair with additional findings including degenerative arthritis grade 4 in some areas   She says she is having continued problems with the knee pain at night when she sleeping difficulty climbing the stairs Global pain centered over the front of the knee  Her range of motion still seems to be good at 0 to 120 degrees but she is globally tender  X-rays left knee grad 3 OA  The patient does wish to have surgery in March  Will proceed with a left total knee arthroplasty    ADDENDUM: She asked for injection in the right shoulder she had gotten 1 from Dr. Loletha Grayer in the past she says it is really hurting and would like to have a repeat action   Procedure note the subacromial injection shoulder RIGHT    Verbal consent was obtained to inject the  RIGHT   Shoulder  Timeout was completed to confirm the injection site is a subacromial space of the  RIGHT  shoulder   Medication used Depo-Medrol 40 mg and lidocaine 1% 3 cc  Anesthesia was provided by ethyl chloride  The injection was performed in the RIGHT  posterior subacromial space. After pinning the skin with alcohol and anesthetized the skin with ethyl chloride the subacromial space was injected using a 20-gauge needle. There were no complications  Sterile dressing was applied.

## 2022-10-03 NOTE — Patient Instructions (Addendum)
Your surgery will be at [place] by Dr Harrison  plan to be in hospital overnight. The hospital will contact you with a preoperative appointment to discuss Anesthesia.  Please arrive on time or 15 minutes early for the preoperative appointment, they have a very tight schedule if you are late or do not come in your surgery will be cancelled.  The phone number for the preop area is [phone]. Please bring your medications with you for the appointment. They will tell you the arrival time for surgery and medication instructions when you have your preoperative evaluation. Do not wear nail polish the day of your surgery and if you take Phentermine you need to stop this medication ONE WEEK prior to your surgery. f you take Invokana, Farxiga, Jardiance, or Steglatro) - Hold 72 hours before the procedure.  If you take Ozempic,  Bydureon or Trulicity do not take for 8 days before your surgery. If you take Victoza, Rybelsis, Saxenda or Adlyxi stop 24 hours before the procedure. Please arrive at the hospital 2 hours before procedure if scheduled at 9:30 or later in the day or at the time the nurse tells you at your preoperative visit.   If you have my chart do not use the time given in my chart use the time given to you by the nurse during your preoperative visit.   Your surgery  time may change. Please be available for phone calls the day of your surgery and the day before. The Short Stay department may need to discuss changes about your surgery time. Not reaching the you could lead to procedure delays and possible cancellation.  You must have a ride home and someone to stay with you for 24 to 48 hours. The person taking you home will receive and sign for the your discharge instructions.  Please be prepared to give your support person's name and telephone number to [facility] [employer]. Dr Harrison will need that name and phone number post procedure.   You will also get a call from a representative of [employer], they have a machine that you will use in the first few weeks after surgery. It is called a CPM.   You will have home physical therapy for 2 weeks after surgery, the home health agency will call you before or just following the surgery to set up visits. [facility] is the agency we normally use, unless you request another agency.   You will get a call also from outpatient therapy for therapy starting when the home therapy is done.  If you have questions or need to Reschedule the surgery, call the office ask for Ronee Ranganathan.    You have decided to proceed with knee replacement surgery. You have decided not to continue with nonoperative measures such as but not limited to oral medication, weight loss, activity modification, physical therapy, bracing, or injection.  We will perform the procedure commonly known as total knee replacement. Some of the risks associated with knee replacement surgery include but are not limited to Bleeding Infection Swelling Stiffness Blood clot Pulmonary embolism  Loosening of the implant Pain that persists even after surgery  Infection is especially devastating complication of knee surgery although rare. If infection does occur your implant will usually have to be removed and several surgeries and antibiotics will be needed to eradicate the infection prior to performing a repeat replacement.   In some cases amputation is required to eradicate the infection. In other rare cases a knee fusion is   needed   In compliance with recent [place] law in federal regulation regarding opioid use and abuse and addiction, we will taper (stop) opioid medication after 2 weeks.  If you're not comfortable with these risks and would like to continue with nonoperative treatment please let Dr. Harrison know prior to your surgery.  

## 2022-10-08 NOTE — Addendum Note (Signed)
Addended byCandice Camp on: 10/08/2022 10:37 AM   Modules accepted: Orders

## 2022-10-30 ENCOUNTER — Other Ambulatory Visit: Payer: Self-pay | Admitting: Orthopedic Surgery

## 2022-10-30 DIAGNOSIS — M1712 Unilateral primary osteoarthritis, left knee: Secondary | ICD-10-CM

## 2022-11-01 ENCOUNTER — Encounter: Payer: Self-pay | Admitting: Orthopedic Surgery

## 2022-11-04 ENCOUNTER — Ambulatory Visit: Payer: 59 | Admitting: Nurse Practitioner

## 2022-11-08 ENCOUNTER — Encounter: Payer: Self-pay | Admitting: Nurse Practitioner

## 2022-11-08 ENCOUNTER — Encounter (HOSPITAL_COMMUNITY): Payer: 59

## 2022-11-08 ENCOUNTER — Ambulatory Visit: Payer: 59 | Admitting: Nurse Practitioner

## 2022-11-08 ENCOUNTER — Telehealth: Payer: Self-pay | Admitting: Radiology

## 2022-11-08 VITALS — BP 139/79 | HR 82 | Ht 62.0 in | Wt 199.4 lb

## 2022-11-08 DIAGNOSIS — I10 Essential (primary) hypertension: Secondary | ICD-10-CM

## 2022-11-08 DIAGNOSIS — E782 Mixed hyperlipidemia: Secondary | ICD-10-CM

## 2022-11-08 DIAGNOSIS — E1140 Type 2 diabetes mellitus with diabetic neuropathy, unspecified: Secondary | ICD-10-CM | POA: Diagnosis not present

## 2022-11-08 LAB — POCT GLYCOSYLATED HEMOGLOBIN (HGB A1C): Hemoglobin A1C: 6.1 % — AB (ref 4.0–5.6)

## 2022-11-08 MED ORDER — SEMAGLUTIDE (2 MG/DOSE) 8 MG/3ML ~~LOC~~ SOPN: 2.0000 mg | PEN_INJECTOR | SUBCUTANEOUS | 1 refills | Status: DC

## 2022-11-08 NOTE — Progress Notes (Signed)
Endocrinology Follow Up Note       11/08/2022, 11:42 AM   Subjective:    Patient ID: Monique Warren, female    DOB: May 06, 1951.  Monique Warren is being seen in follow up after being seen in consultation for management of currently uncontrolled symptomatic diabetes requested by  Lindell Spar, MD.   Past Medical History:  Diagnosis Date   Anxiety    Arthritis    Asthma    Celiac disease    Depression    Diabetes mellitus, type II ([place])    Diabetic neuropathy ([place])    Diastolic dysfunction    Grade 1 with preserved EF   Dysrhythmia    Heart murmur    Hiatal hernia    Sliding   Hyperlipidemia    Hypertension    Migraine    Obstructive sleep apnea    Osteoporosis    Vertigo     Past Surgical History:  Procedure Laterality Date   ABDOMINAL HYSTERECTOMY  09/29/2003   APPENDECTOMY     BALLOON DILATION N/A 02/08/2022   Procedure: BALLOON DILATION;  Surgeon: Eloise Harman, DO;  Location: [facility];  Service: Endoscopy;  Laterality: N/A;   BIOPSY  02/08/2022   Procedure: BIOPSY;  Surgeon: Eloise Harman, DO;  Location: [facility];  Service: Endoscopy;;   BREAST BIOPSY Left    CARPAL TUNNEL RELEASE     CATARACT EXTRACTION Bilateral    October and November 2016   COLONOSCOPY  09/2017   [name], MD in [place]; 5 mm tubular adenoma in the descending colon s/p resected, mild sigmoid diverticulosis, internal hemorrhoids.  Recommended repeat colonoscopy in 5 years.   CRANIOTOMY FOR ANEURYSM / VERTEBROBASILAR / CAROTID CIRCULATION Right 10/24/2015   ESOPHAGOGASTRODUODENOSCOPY  09/2017   [name]; irregular Z-line s/p biopsy, decreased LES tone, 4 cm hiatal hernia, erythema in gastric antrum biopsied, normal examined duodenum biopsied.  Esophageal biopsy suggestive of reflux, no Barrett's, gastric biopsy with nonspecific chronic gastritis with intestinal metaplasia, duodenal biopsy with  increased intraepithelial lymphocytes without blunting of villi, nonspecific.   ESOPHAGOGASTRODUODENOSCOPY (EGD) WITH PROPOFOL N/A 02/08/2022   Surgeon: Eloise Harman, DO; 2 cm hiatal hernia, short segment Barrett's esophagus without dysplasia, mild Schatzki's ring dilated, gastritis with biopsies benign, normal examined duodenum.  Repeat EGD in 5 years.   KNEE ARTHROSCOPY WITH MEDIAL MENISECTOMY Left 04/09/2022   Procedure: KNEE ARTHROSCOPY WITH PARTIAL MEDIAL MENISCECTOMY, LATERAL MENISCAL DEBRIDEMENT;  Surgeon: Carole Civil, MD;  Location: [facility];  Service: Orthopedics;  Laterality: Left;   KNEE ARTHROSCOPY WITH MENISCAL REPAIR Left 04/09/2022   Procedure: KNEE ARTHROSCOPY WITH MEDIAL MENISCAL REPAIR;  Surgeon: Carole Civil, MD;  Location: [facility];  Service: Orthopedics;  Laterality: Left;   LAPAROSCOPY     UTERINE FIBROID SURGERY      Social History   Socioeconomic History   Marital status: Married    Spouse name: Not on file   Number of children: Not on file   Years of education: Not on file   Highest education level: Not on file  Occupational History   Not on file  Tobacco Use   Smoking status: Former  Packs/day: 0.50    Types: Cigarettes   Smokeless tobacco: Never  Vaping Use   Vaping Use: Never used  Substance and Sexual Activity   Alcohol use: Yes    Alcohol/week: 4.0 standard drinks of alcohol    Types: 2 Glasses of wine, 2 Shots of liquor per week    Comment: rarely   Drug use: Never   Sexual activity: Not on file  Other Topics Concern   Not on file  Social History Narrative   Not on file   Social Determinants of Health   Financial Resource Strain: Not on file  Food Insecurity: Not on file  Transportation Needs: Not on file  Physical Activity: Not on file  Stress: Not on file  Social Connections: Not on file    Family History  Problem Relation Age of Onset   Breast cancer Mother    Thyroid disease Mother    Stroke Mother     Hypertension Father    Hyperlipidemia Father    Heart attack Father    Heart failure Father    Breast cancer Paternal Grandmother    Cancer - Colon Neg Hx    Gastric cancer Neg Hx    Esophageal cancer Neg Hx    Liver cancer Neg Hx    Autoimmune disease Neg Hx     Outpatient Encounter Medications as of 11/08/2022  Medication Sig   AIMOVIG 140 MG/ML SOAJ INJECT 140 MG UNDER THE SKIN EVERY 30 DAYS   AIRDUO DIGIHALER 232-14 MCG/ACT AEPB USE 1 INHALATION TWICE A DAY (Patient taking differently: Inhale 2 puffs into the lungs 2 (two) times daily.)   albuterol (VENTOLIN HFA) 108 (90 Base) MCG/ACT inhaler USE 2 INHALATIONS EVERY 6 HOURS AS NEEDED FOR WHEEZING OR SHORTNESS OF BREATH   atorvastatin (LIPITOR) 40 MG tablet Take 1 tablet (40 mg total) by mouth at bedtime.   Cholecalciferol (VITAMIN D3) 125 MCG (5000 UT) capsule Take 5,000 Units by mouth daily.   diltiazem (CARDIZEM SR) 120 MG 12 hr capsule Take 2 capsules (240 mg total) by mouth daily.   DULoxetine (CYMBALTA) 60 MG capsule TAKE 1 CAPSULE DAILY (Patient taking differently: Take 60 mg by mouth at bedtime.)   Estradiol 10 MCG TABS vaginal tablet Place 10 mcg vaginally every Monday.   famotidine (PEPCID) 40 MG tablet TAKE 1 TABLET DAILY (Patient taking differently: Take 40 mg by mouth at bedtime.)   gabapentin (NEURONTIN) 300 MG capsule Take 1 capsule (300 mg total) by mouth at bedtime.   Lancets (ONETOUCH DELICA PLUS 123XX123) MISC Use to check glucose once daily   LINZESS 72 MCG capsule TAKE 1 CAPSULE DAILY BEFORE BREAKFAST   metoprolol succinate (TOPROL-XL) 50 MG 24 hr tablet Take 0.5 tablets (25 mg total) by mouth daily for 14 days, THEN 1 tablet (50 mg total) daily. Take with or immediately following a meal.. (Patient taking differently: 1 tablet (25 mg total) daily. Take with or immediately following a meal..)   Multiple Vitamin (MULTIVITAMIN WITH MINERALS) TABS tablet Take 1 tablet by mouth daily.   ONETOUCH ULTRA test strip Use to  monitor glucose once daily   PROLIA 60 MG/ML SOSY injection Inject 60 mg into the skin every 6 (six) months.   RABEprazole (ACIPHEX) 20 MG tablet TAKE 1 TABLET DAILY BEFORE BREAKFAST   Semaglutide, 2 MG/DOSE, 8 MG/3ML SOPN Inject 2 mg as directed once a week.   venlafaxine (EFFEXOR) 37.5 MG tablet Take 1 tablet (37.5 mg total) by mouth 2 (two) times daily. (Patient  taking differently: Take 37.5 mg by mouth daily.)   zolmitriptan (ZOMIG) 5 MG tablet Take 5 mg by mouth daily as needed for migraine.   [DISCONTINUED] OZEMPIC, 1 MG/DOSE, 4 MG/3ML SOPN INJECT 1 MG ONCE A WEEK AS DIRECTED   acetaminophen (TYLENOL) 500 MG tablet Take 1,000 mg by mouth every 6 (six) hours as needed for moderate pain.   Facility-Administered Encounter Medications as of 11/08/2022  Medication   bupivacaine-meloxicam ER (ZYNRELEF) injection 400 mg    ALLERGIES: Allergies  Allergen Reactions   Cefuroxime Anaphylaxis, Hives, Shortness Of Breath, Swelling and Anxiety   Codeine Hives, Shortness Of Breath, Nausea And Vomiting, Swelling and Anxiety   Sulfa Antibiotics Anaphylaxis, Hives, Nausea And Vomiting and Swelling    Swollen throat   Sulfamethoxazole-Trimethoprim Shortness Of Breath, Swelling and Anxiety    Throat swelling    Gluten Meal     Severe reflux - celiac disease    VACCINATION STATUS: Immunization History  Administered Date(s) Administered   Fluad Quad(high Dose 65+) 07/02/2021, 06/05/2022   Influenza Split 07/26/2014   Influenza, High Dose Seasonal PF 09/22/2018   Influenza-Unspecified 07/26/2014   PFIZER(Purple Top)SARS-COV-2 Vaccination 12/01/2019, 12/29/2019   Pneumococcal Conjugate-13 08/02/2019   Pneumococcal Polysaccharide-23 07/28/2020   Tdap 09/30/2016   Zoster Recombinat (Shingrix) 06/03/2018, 11/10/2019    Diabetes She presents for her follow-up diabetic visit. She has type 2 diabetes mellitus. Onset time: Diagnosed at approx age of 18. Her disease course has been stable. There are  no hypoglycemic associated symptoms. Associated symptoms include chest pain (ongoing work up with cardiology) and foot paresthesias. Pertinent negatives for diabetes include no polyuria and no weight loss. There are no hypoglycemic complications. Symptoms are stable. Diabetic complications include nephropathy and peripheral neuropathy. Risk factors for coronary artery disease include diabetes mellitus, dyslipidemia, family history, hypertension, obesity, post-menopausal and sedentary lifestyle. Current diabetic treatments: Ozempic 1 mg SQ weekly. She is compliant with treatment all of the time. Her weight is increasing steadily. She is following a generally healthy diet. When asked about meal planning, she reported none. She has not had a previous visit with a dietitian. She participates in exercise intermittently. Her home blood glucose trend is increasing steadily. Her overall blood glucose range is 130-140 mg/dl. (She presents today with her meter and logs showing mostly at target fasting glycemic profile.  Her POCT A1c today is 6.1%, essentially unchanged.  She needs right TKR in the near future.  She denies any hypoglycemia.) An ACE inhibitor/angiotensin II receptor blocker is being taken. She does not see a podiatrist.Eye exam is current (has eye exam later today).  Hyperlipidemia This is a chronic problem. The current episode started more than 1 year ago. The problem is uncontrolled. Recent lipid tests were reviewed and are high. Exacerbating diseases include chronic renal disease, diabetes and obesity. Factors aggravating her hyperlipidemia include fatty foods. Associated symptoms include chest pain (ongoing work up with cardiology). Current antihyperlipidemic treatment includes statins. The current treatment provides moderate improvement of lipids. There are no compliance problems.  Risk factors for coronary artery disease include diabetes mellitus, dyslipidemia, family history, obesity, hypertension,  post-menopausal and a sedentary lifestyle.  Hypertension This is a chronic problem. The current episode started more than 1 year ago. The problem has been gradually improving since onset. The problem is controlled. Associated symptoms include chest pain (ongoing work up with cardiology). There are no associated agents to hypertension. Risk factors for coronary artery disease include diabetes mellitus, dyslipidemia, family history, obesity, post-menopausal state and sedentary  lifestyle. Past treatments include ACE inhibitors. The current treatment provides moderate improvement. There are no compliance problems.  Hypertensive end-organ damage includes kidney disease. Identifiable causes of hypertension include chronic renal disease and sleep apnea.     Review of systems  Constitutional: + stable body weight, current Body mass index is 36.47 kg/m., no fatigue, no subjective hyperthermia, no subjective hypothermia Eyes: no blurry vision, no xerophthalmia ENT: no sore throat, no nodules palpated in throat, no dysphagia/odynophagia, no hoarseness Cardiovascular: no chest pain, no shortness of breath, no palpitations, no leg swelling Respiratory: no cough, no shortness of breath Gastrointestinal: no nausea/vomiting/diarrhea Musculoskeletal: no muscle/joint aches Skin: no rashes, no hyperemia Neurological: no tremors, + numbness/tingling to BLE, no dizziness Psychiatric: no depression, no anxiety  Objective:     BP 139/79 (BP Location: Left Arm, Patient Position: Sitting, Cuff Size: Large)   Pulse 82   Ht '5\' 2"'$  (1.575 m)   Wt 199 lb 6.4 oz (90.4 kg)   BMI 36.47 kg/m   Wt Readings from Last 3 Encounters:  11/08/22 199 lb 6.4 oz (90.4 kg)  10/03/22 194 lb (88 kg)  10/02/22 194 lb 6.4 oz (88.2 kg)     BP Readings from Last 3 Encounters:  11/08/22 139/79  10/02/22 118/69  07/16/22 122/62     Physical Exam- Limited  Constitutional:  Body mass index is 36.47 kg/m. , not in acute  distress, normal state of mind Eyes:  EOMI, no exophthalmos Musculoskeletal: no gross deformities, strength intact in all four extremities, no gross restriction of joint movements Skin:  no rashes, no hyperemia Neurological: no tremor with outstretched hands     Diabetic Foot Exam - Simple   No data filed     CMP ( most recent) CMP     Component Value Date/Time   NA 142 06/05/2022 1142   K 4.6 06/05/2022 1142   CL 106 06/05/2022 1142   CO2 16 (L) 06/05/2022 1142   GLUCOSE 111 (H) 06/05/2022 1142   GLUCOSE 169 (H) 04/05/2022 1335   BUN 13 06/05/2022 1142   CREATININE 0.76 06/05/2022 1142   CALCIUM 9.0 06/05/2022 1142   PROT 6.1 06/05/2022 1142   ALBUMIN 4.1 06/05/2022 1142   AST 18 06/05/2022 1142   ALT 21 06/05/2022 1142   ALKPHOS 86 06/05/2022 1142   BILITOT 0.4 06/05/2022 1142   GFRNONAA >60 04/05/2022 1335     Diabetic Labs (most recent): Lab Results  Component Value Date   HGBA1C 6.1 (A) 11/08/2022   HGBA1C 6.0 (H) 06/05/2022   HGBA1C 6.0 (H) 04/05/2022     Lipid Panel ( most recent) Lipid Panel     Component Value Date/Time   CHOL 246 (H) 06/05/2022 1142   TRIG 165 (H) 06/05/2022 1142   HDL 41 06/05/2022 1142   CHOLHDL 6.0 (H) 06/05/2022 1142   LDLCALC 174 (H) 06/05/2022 1142   LABVLDL 31 06/05/2022 1142      Lab Results  Component Value Date   TSH 1.780 06/05/2022   TSH 2.830 10/02/2021   FREET4 1.08 10/02/2021           Assessment & Plan:   1) Type 2 diabetes mellitus with diabetic neuropathy, without long-term current use of insulin ([place])  She presents today with her meter and logs showing mostly at target fasting glycemic profile.  Her POCT A1c today is 6.1%, essentially unchanged.  She needs right TKR in the near future.  She denies any hypoglycemia.  - Monique Warren has currently  uncontrolled symptomatic type 2 DM since 72 years of age.  -Recent labs reviewed.  - I had a long discussion with her about the progressive nature  of diabetes and the pathology behind its complications. -her diabetes is complicated by mild CKD, peripheral neuropathy and she remains at a high risk for more acute and chronic complications which include CAD, CVA, CKD, retinopathy, and neuropathy. These are all discussed in detail with her.  - Nutritional counseling repeated at each appointment due to patients tendency to fall back in to old habits.  - The patient admits there is a room for improvement in their diet and drink choices. -  Suggestion is made for the patient to avoid simple carbohydrates from their diet including Cakes, Sweet Desserts / Pastries, Ice Cream, Soda (diet and regular), Sweet Tea, Candies, Chips, Cookies, Sweet Pastries, Store Bought Juices, Alcohol in Excess of 1-2 drinks a day, Artificial Sweeteners, Coffee Creamer, and "Sugar-free" Products. This will help patient to have stable blood glucose profile and potentially avoid unintended weight gain.   - I encouraged the patient to switch to unprocessed or minimally processed complex starch and increased protein intake (animal or plant source), fruits, and vegetables.   - Patient is advised to stick to a routine mealtimes to eat 3 meals a day and avoid unnecessary snacks (to snack only to correct hypoglycemia).  - I have approached her with the following individualized plan to manage her diabetes and patient agrees:   -She is advised to increase her Ozempic to 2 mg SQ weekly.  -she does not need to routinely monitor glucose due to monotherapy with Ozempic only.   - She does not tolerate Metformin due to GI upset (r/t her Celiac disease) and has allergy to sulfa meds.  - Specific targets for  A1c; LDL, HDL, and Triglycerides were discussed with the patient.  2) Blood Pressure /Hypertension:  her blood pressure is controlled to target.   she is advised to continue her current medications including Lisinopril 20 mg p.o. daily with breakfast.  3) Lipids/Hyperlipidemia:     Her most recent lipid panel from 06/05/22 shows uncontrolled LDL of 174 but she admits she had not been taking her medications.  Her PCP counseled her on this and advised she restart it which she has.  She is advised to continue Lipitor 40 mg po daily at bedtime.  Side effects and precautions discussed with her.    4)  Weight/Diet:  her Body mass index is 36.47 kg/m.  -  clearly complicating her diabetes care.   she is a candidate for weight loss. I discussed with her the fact that loss of 5 - 10% of her  current body weight will have the most impact on her diabetes management.  Exercise, and detailed carbohydrates information provided  -  detailed on discharge instructions.  5) Chronic Care/Health Maintenance: -she is on ACEI/ARB and Statin medications and is encouraged to initiate and continue to follow up with Ophthalmology, Dentist, Podiatrist at least yearly or according to recommendations, and advised to stay away from smoking. I have recommended yearly flu vaccine and pneumonia vaccine at least every 5 years; moderate intensity exercise for up to 150 minutes weekly; and sleep for at least 7 hours a day.  - she is advised to maintain close follow up with Lindell Spar, MD for primary care needs, as well as her other providers for optimal and coordinated care.      I spent  25  minutes in  the care of the patient today including review of labs from [place], Lipids, Thyroid Function, Hematology (current and previous including abstractions from other facilities); face-to-face time discussing  her blood glucose readings/logs, discussing hypoglycemia and hyperglycemia episodes and symptoms, medications doses, her options of short and long term treatment based on the latest standards of care / guidelines;  discussion about incorporating lifestyle medicine;  and documenting the encounter. Risk reduction counseling performed per USPSTF guidelines to reduce obesity and cardiovascular risk factors.      Please refer to Patient Instructions for Blood Glucose Monitoring and Insulin/Medications Dosing Guide"  in media tab for additional information. Please  also refer to " Patient Self Inventory" in the Media  tab for reviewed elements of pertinent patient history.  Monique Warren participated in the discussions, expressed understanding, and voiced agreement with the above plans.  All questions were answered to her satisfaction. she is encouraged to contact clinic should she have any questions or concerns prior to her return visit.     Follow up plan: - Return in about 4 months (around 03/09/2023) for Diabetes F/U with A1c in office.   Rayetta Pigg, [facility] [address] [place], [place] [address] Phone: [phone] Fax: [phone]  11/08/2022, 11:42 AM

## 2022-11-08 NOTE — Telephone Encounter (Signed)
Patient called, LMVM yesterday saying that she needed to make an appt with Dr Aline Brochure for evaluation of right knee for a TKA.  Please call her.

## 2022-11-12 ENCOUNTER — Encounter (HOSPITAL_COMMUNITY): Admission: RE | Payer: Self-pay | Source: Home / Self Care

## 2022-11-12 ENCOUNTER — Ambulatory Visit (HOSPITAL_COMMUNITY): Admission: RE | Admit: 2022-11-12 | Payer: 59 | Source: Home / Self Care | Admitting: Orthopedic Surgery

## 2022-11-12 SURGERY — ARTHROPLASTY, KNEE, TOTAL
Anesthesia: Choice | Site: Knee | Laterality: Left

## 2022-11-13 ENCOUNTER — Encounter: Payer: Self-pay | Admitting: Orthopedic Surgery

## 2022-11-13 ENCOUNTER — Ambulatory Visit (INDEPENDENT_AMBULATORY_CARE_PROVIDER_SITE_OTHER): Payer: 59 | Admitting: Orthopedic Surgery

## 2022-11-13 ENCOUNTER — Ambulatory Visit (INDEPENDENT_AMBULATORY_CARE_PROVIDER_SITE_OTHER): Payer: 59

## 2022-11-13 VITALS — BP 139/79 | Ht 62.0 in | Wt 199.0 lb

## 2022-11-13 DIAGNOSIS — M25561 Pain in right knee: Secondary | ICD-10-CM

## 2022-11-13 DIAGNOSIS — Z01818 Encounter for other preprocedural examination: Secondary | ICD-10-CM | POA: Diagnosis not present

## 2022-11-13 DIAGNOSIS — M1711 Unilateral primary osteoarthritis, right knee: Secondary | ICD-10-CM | POA: Diagnosis not present

## 2022-11-13 MED ORDER — BUPIVACAINE-MELOXICAM ER 400-12 MG/14ML IJ SOLN: 400.0000 mg | Freq: Once | INTRAMUSCULAR | Status: AC

## 2022-11-13 NOTE — Patient Instructions (Signed)
Your surgery will be at [facility] by Dr Aline Brochure  plan to be in hospital overnight. The hospital will contact you with a preoperative appointment to discuss Anesthesia.  Please arrive on time or 15 minutes early for the preoperative appointment, they have a very tight schedule if you are late or do not come in your surgery will be cancelled.  The phone number for the preop area is [phone]. Please bring your medications with you for the appointment. They will tell you the arrival time for surgery and medication instructions when you have your preoperative evaluation. Do not wear nail polish the day of your surgery and if you take Phentermine you need to stop this medication ONE WEEK prior to your surgery. f you take Augustina Mood, Jardiance, or Steglatro) - Hold 72 hours before the procedure.  If you take Ozempic,  Bydureon or Trulicity do not take for 8 days before your surgery. If you take Victoza, Rybelsis, Saxenda or Adlyxi stop 24 hours before the procedure. Please arrive at the hospital 2 hours before procedure if scheduled at 9:30 or later in the day or at the time the nurse tells you at your preoperative visit.   If you have my chart do not use the time given in my chart use the time given to you by the nurse during your preoperative visit.   Your surgery  time may change. Please be available for phone calls the day of your surgery and the day before. The Short Stay department may need to discuss changes about your surgery time. Not reaching the you could lead to procedure delays and possible cancellation.  You must have a ride home and someone to stay with you for 24 to 48 hours. The person taking you home will receive and sign for the your discharge instructions.  Please be prepared to give your support person's name and telephone number to [employer]. Dr Aline Brochure will need that name and phone number post procedure.   You will also get a call from a representative of [employer], they have a machine that you will use in the first few weeks after surgery. It is called a CPM.   You will have home physical therapy for 2 weeks after surgery, the home health agency will call you before or just following the surgery to set up visits. [facility] is the agency we normally use, unless you request another agency.   You will get a call also from outpatient therapy for therapy starting when the home therapy is done.  If you have questions or need to Reschedule the surgery, call the office ask for Guy Seese.

## 2022-11-13 NOTE — Progress Notes (Unsigned)
Type of appointment: New problem established patient  SUMMARY:   Encounter Diagnosis  Name Primary?   Primary osteoarthritis of right knee Yes    72 year old female with bilateral knee arthritis scheduled for left total knee arthroplasty but complained of severe right knee pain and was brought in for evaluation found to have arthritis of the right knee as well.  She is adamant that she is not having any back pain related to this or radicular symptoms and her pain is all in the right knee on the medial side  She did have anti-inflammatories for the left knee which should have helped the right knee if at all she needed  We have therefore changed her to a right total knee arthroplasty after imaging showed grade 3 disease  No orders of the defined types were placed in this encounter.    Chief Complaint  Patient presents with   Knee Pain    Right, states right knee is worse than left (cancelled left total knee replacement because right is more painful)    HPI  ROS  Body mass index is 36.4 kg/m.   Physical Exam  Constitutional:  BP 139/79 Comment: 11/08/22  Ht '5\' 2"'$  (1.575 m)   Wt 199 lb (90.3 kg)   BMI 36.40 kg/m   General appearance: normal no deformities normal grooming and hygiene  CDV: Normal pulses and perfusion normal temperature without tenderness no swelling or varicosities,  ***edema  Musculoskeletal Gait is  ***  '@PHYSEXAM'$ @   Inspection and palpation reveal  ***  Range of motion ***  Stability tests ***  Motor exam  ***  Provocative tests ***  Skin normal without rash lesion ulcers or caf au lait spots  Neuro normal sensation normal coordination normal reflexes  Psychiatric awake alert and oriented x3 without depression anxiety or agitation   Past Medical History:  Diagnosis Date   Anxiety    Arthritis    Asthma    Celiac disease    Depression    Diabetes mellitus, type II ([place])    Diabetic neuropathy (HCC)    Diastolic dysfunction     Grade 1 with preserved EF   Dysrhythmia    Heart murmur    Hiatal hernia    Sliding   Hyperlipidemia    Hypertension    Migraine    Obstructive sleep apnea    Osteoporosis    Vertigo    Past Surgical History:  Procedure Laterality Date   ABDOMINAL HYSTERECTOMY  09/29/2003   APPENDECTOMY     BALLOON DILATION N/A 02/08/2022   Procedure: BALLOON DILATION;  Surgeon: Eloise Harman, DO;  Location[facility];  Service: Endoscopy;  Laterality: N/A;   BIOPSY  02/08/2022   Procedure: BIOPSY;  Surgeon: Eloise Harman, DO;  Location: [facility];  Service: Endoscopy;;   BREAST BIOPSY Left    CARPAL TUNNEL RELEASE     CATARACT EXTRACTION Bilateral    October and November 2016   COLONOSCOPY  09/2017   [name], MD in [place]; 5 mm tubular adenoma in the descending colon s/p resected, mild sigmoid diverticulosis, internal hemorrhoids.  Recommended repeat colonoscopy in 5 years.   CRANIOTOMY FOR ANEURYSM / VERTEBROBASILAR / CAROTID CIRCULATION Right 10/24/2015   ESOPHAGOGASTRODUODENOSCOPY  09/2017   [name]; irregular Z-line s/p biopsy, decreased LES tone, 4 cm hiatal hernia, erythema in gastric antrum biopsied, normal examined duodenum biopsied.  Esophageal biopsy suggestive of reflux, no Barrett's, gastric biopsy with nonspecific chronic gastritis with intestinal metaplasia,  duodenal biopsy with increased intraepithelial lymphocytes without blunting of villi, nonspecific.   ESOPHAGOGASTRODUODENOSCOPY (EGD) WITH PROPOFOL N/A 02/08/2022   Surgeon: Eloise Harman, DO; 2 cm hiatal hernia, short segment Barrett's esophagus without dysplasia, mild Schatzki's ring dilated, gastritis with biopsies benign, normal examined duodenum.  Repeat EGD in 5 years.   KNEE ARTHROSCOPY WITH MEDIAL MENISECTOMY Left 04/09/2022   Procedure: KNEE ARTHROSCOPY WITH PARTIAL MEDIAL MENISCECTOMY, LATERAL MENISCAL DEBRIDEMENT;  Surgeon: Carole Civil, MD;  Location: [facility];  Service: Orthopedics;   Laterality: Left;   KNEE ARTHROSCOPY WITH MENISCAL REPAIR Left 04/09/2022   Procedure: KNEE ARTHROSCOPY WITH MEDIAL MENISCAL REPAIR;  Surgeon: Carole Civil, MD;  Location: [facility];  Service: Orthopedics;  Laterality: Left;   LAPAROSCOPY     UTERINE FIBROID SURGERY     Social History   Tobacco Use   Smoking status: Former    Packs/day: 0.50    Types: Cigarettes   Smokeless tobacco: Never  Vaping Use   Vaping Use: Never used  Substance Use Topics   Alcohol use: Yes    Alcohol/week: 4.0 standard drinks of alcohol    Types: 2 Glasses of wine, 2 Shots of liquor per week    Comment: rarely   Drug use: Never     Assessment and Plan:  Imaging: I have personally reviewed the images and my personal interpretation of the images:  ***  Encounter Diagnosis  Name Primary?   Primary osteoarthritis of right knee Yes     ***

## 2022-11-14 ENCOUNTER — Encounter: Payer: Self-pay | Admitting: Radiology

## 2022-11-25 ENCOUNTER — Encounter: Payer: 59 | Admitting: Orthopedic Surgery

## 2022-11-27 ENCOUNTER — Other Ambulatory Visit: Payer: Self-pay | Admitting: Orthopedic Surgery

## 2022-11-27 ENCOUNTER — Encounter: Payer: 59 | Admitting: Orthopedic Surgery

## 2022-11-27 DIAGNOSIS — M1711 Unilateral primary osteoarthritis, right knee: Secondary | ICD-10-CM

## 2022-11-27 NOTE — Patient Instructions (Addendum)
Monique Warren  11/27/2022     '@PREFPERIOPPHARMACY'$ @   Your procedure is scheduled on  12/06/2022.   Report to [facility] at  1020  A.M.   Call this number if you have problems the morning of surgery:  [phone]  If you experience any cold or flu symptoms such as cough, fever, chills, shortness of breath, etc. between now and your scheduled surgery, please notify us at the above number.   Remember:  Do not eat or drink after midnight.      Take these medicines the morning of surgery with A SIP OF WATER          diltiazem, gabapentin, metoprolol, aciphex, effexor, zomig.     Do not wear jewelry, make-up or nail polish.  Do not wear lotions, powders, or perfumes, or deodorant.  Do not shave 48 hours prior to surgery.  Men may shave face and neck.  Do not bring valuables to the hospital.  [facility] is not responsible for any belongings or valuables.  Contacts, dentures or bridgework may not be worn into surgery.  Leave your suitcase in the car.  After surgery it may be brought to your room.  For patients admitted to the hospital, discharge time will be determined by your treatment team.  Patients discharged the day of surgery will not be allowed to drive home and must have someone with them for 24 hours.    Special instructions:   DO NOT smoke tobacco or vape for 24 hours before your procedure.  Please read over the following fact sheets that you were given. Pain Booklet, Coughing and Deep Breathing, Blood Transfusion Information, Total Joint Packet, Surgical Site Infection Prevention, Anesthesia Post-op Instructions, and Care and Recovery After Surgery       Total Knee Replacement, Care After This sheet gives you information about how to care for yourself after your procedure. Your health care provider may also give you more specific instructions. If you have problems or questions, contact your health care provider. What can I expect after the  procedure? After the procedure, it is common to have: Redness, pain, and swelling at the incision area. Stiffness. Discomfort. A small amount of blood or clear fluid coming from your incision. Follow these instructions at home: Medicines Take over-the-counter and prescription medicines only as told by your health care provider. If you were prescribed a blood thinner (anticoagulant), take it as told by your health care provider. Ask your health care provider if the medicine prescribed to you: Requires you to avoid driving or using machinery. Can cause constipation. You may need to take these actions to prevent or treat constipation: Drink enough fluid to keep your urine pale yellow. Take over-the-counter or prescription medicines. Eat foods that are high in fiber, such as beans, whole grains, and fresh fruits and vegetables. Limit foods that are high in fat and processed sugars, such as fried or sweet foods. Incision care  Follow instructions from your health care provider about how to take care of your incision. Make sure you: Wash your hands with soap and water for at least 20 seconds before and after you change your bandage (dressing). If soap and water are not available, use hand sanitizer. Change your dressing as told by your health care provider. Leave stitches (sutures), staples, skin glue, or adhesive strips in place. These skin closures may need to stay in place for 2 weeks or longer. If adhesive strip edges start  to loosen and curl up, you may trim the loose edges. Do not remove adhesive strips completely unless your health care provider tells you to do that. Do not take baths, swim, or use a hot tub until your health care provider approves. Check your incision area every day for signs of infection. Check for: More redness, swelling, or pain. More fluid or blood. Warmth. Pus or a bad smell. Activity Rest as told by your health care provider. Avoid sitting for a long time  without moving. Get up to take short walks every 1-2 hours. This is important to improve blood flow and breathing. Ask for help if you feel weak or unsteady. Follow instructions from your health care provider about using a walker, crutches, or a cane. You may use your legs to support (bear) your body weight as told by your health care provider. Follow instructions about how much weight you may safely support on your affected leg (weight-bearing restrictions). A physical therapist may show you how to get out of a bed and chair and how to go up and down stairs. You will first do this with a walker, crutches, or a cane and then without any of these devices. Once you are able to walk without a limp, you may stop using a walker, crutches, or a cane. Do exercises as told by your health care provider or physical therapist. Avoid high-impact activities, including running, jumping rope, and doing jumping jacks. Do not play contact sports until your health care provider approves. Return to your normal activities as told by your health care provider. Ask your health care provider what activities are safe for you. Managing pain, stiffness, and swelling  If directed, put ice on your knee. To do this: Put ice in a plastic bag or use the icing device (cold flow pad) that you were given. Follow instructions from your health care provider about how to use the icing device. Place a towel between your skin and the bag or between your skin and the icing device. Leave the ice on for 20 minutes, 2-3 times a day. Remove the ice if your skin turns bright red. This is very important. If you cannot feel pain, heat, or cold, you have a greater risk of damage to the area. Move your toes often to reduce stiffness and swelling. Raise (elevate) your leg above the level of your heart while you are sitting or lying down. Use several pillows to keep your leg straight. Do not put a pillow just under the knee. If the knee is bent  for a long time, this may lead to stiffness. Wear elastic knee support as told by your health care provider. Safety  To help prevent falls, keep floors clear of objects you may trip over. Place items that you may need within easy reach. Wear an apron or tool belt with pockets for carrying objects. This leaves your hands free to help with your balance. Ask your health care provider when it is safe to drive. General instructions Wear compression stockings as told by your health care provider. These stockings help to prevent blood clots and reduce swelling in your legs. Continue with breathing exercises. This helps prevent lung infection. Do not use any products that contain nicotine or tobacco. These products include cigarettes, chewing tobacco, and vaping devices, such as e-cigarettes. These can delay healing after surgery. If you need help quitting, ask your health care provider. Tell your health care provider if you plan to have dental work. Also: Tell  your dentist about your joint replacement. Ask your health care provider if there are any special instructions you need to follow before having dental care and routine cleanings. Keep all follow-up visits. This is important. Contact a health care provider if: You have a fever or chills. You have a cough or feel short of breath. Your medicine is not controlling your pain. You have any of these signs of infection: More redness, swelling, or pain around your incision. More fluid or blood coming from your incision. Warmth coming from your incision. Pus or a bad smell coming from your incision. You fall. Get help right away if: You have severe pain. You have trouble breathing. You have chest pain. You have redness, swelling, pain, or warmth in your calf or leg. Your incision breaks open after sutures or staples are removed. These symptoms may represent a serious problem that is an emergency. Do not wait to see if the symptoms will go away.  Get medical help right away. Call your local emergency services (911 in the [place] Do not drive yourself to the hospital. Summary After the procedure, it is common to have pain and swelling at the incision area, a small amount of blood or fluid coming from your incision, and stiffness. Follow instructions from your health care provider about how to take care of your incision. Use crutches, a walker, or a cane as told by your health care provider. This information is not intended to replace advice given to you by your health care provider. Make sure you discuss any questions you have with your health care provider. Document Revised: 02/22/2020 Document Reviewed: 02/22/2020 Elsevier Patient Education  [place] Anesthesia, Adult, Care After The following information offers guidance on how to care for yourself after your procedure. Your health care provider may also give you more specific instructions. If you have problems or questions, contact your health care provider. What can I expect after the procedure? After the procedure, it is common for people to: Have pain or discomfort at the IV site. Have nausea or vomiting. Have a sore throat or hoarseness. Have trouble concentrating. Feel cold or chills. Feel weak, sleepy, or tired (fatigue). Have soreness and body aches. These can affect parts of the body that were not involved in surgery. Follow these instructions at home: For the time period you were told by your health care provider:  Rest. Do not participate in activities where you could fall or become injured. Do not drive or use machinery. Do not drink alcohol. Do not take sleeping pills or medicines that cause drowsiness. Do not make important decisions or sign legal documents. Do not take care of children on your own. General instructions Drink enough fluid to keep your urine pale yellow. If you have sleep apnea, surgery and certain medicines can increase your  risk for breathing problems. Follow instructions from your health care provider about wearing your sleep device: Anytime you are sleeping, including during daytime naps. While taking prescription pain medicines, sleeping medicines, or medicines that make you drowsy. Return to your normal activities as told by your health care provider. Ask your health care provider what activities are safe for you. Take over-the-counter and prescription medicines only as told by your health care provider. Do not use any products that contain nicotine or tobacco. These products include cigarettes, chewing tobacco, and vaping devices, such as e-cigarettes. These can delay incision healing after surgery. If you need help quitting, ask your health care provider. Contact a health  care provider if: You have nausea or vomiting that does not get better with medicine. You vomit every time you eat or drink. You have pain that does not get better with medicine. You cannot urinate or have bloody urine. You develop a skin rash. You have a fever. Get help right away if: You have trouble breathing. You have chest pain. You vomit blood. These symptoms may be an emergency. Get help right away. Call 911. Do not wait to see if the symptoms will go away. Do not drive yourself to the hospital. Summary After the procedure, it is common to have a sore throat, hoarseness, nausea, vomiting, or to feel weak, sleepy, or fatigue. For the time period you were told by your health care provider, do not drive or use machinery. Get help right away if you have difficulty breathing, have chest pain, or vomit blood. These symptoms may be an emergency. This information is not intended to replace advice given to you by your health care provider. Make sure you discuss any questions you have with your health care provider. Document Revised: 11/30/2021 Document Reviewed: 11/30/2021 Elsevier Patient Education  [place]. How to Use  Chlorhexidine Before Surgery Chlorhexidine gluconate (CHG) is a germ-killing (antiseptic) solution that is used to clean the skin. It can get rid of the bacteria that normally live on the skin and can keep them away for about 24 hours. To clean your skin with CHG, you may be given: A CHG solution to use in the shower or as part of a sponge bath. A prepackaged cloth that contains CHG. Cleaning your skin with CHG may help lower the risk for infection: While you are staying in the intensive care unit of the hospital. If you have a vascular access, such as a central line, to provide short-term or long-term access to your veins. If you have a catheter to drain urine from your bladder. If you are on a ventilator. A ventilator is a machine that helps you breathe by moving air in and out of your lungs. After surgery. What are the risks? Risks of using CHG include: A skin reaction. Hearing loss, if CHG gets in your ears and you have a perforated eardrum. Eye injury, if CHG gets in your eyes and is not rinsed out. The CHG product catching fire. Make sure that you avoid smoking and flames after applying CHG to your skin. Do not use CHG: If you have a chlorhexidine allergy or have previously reacted to chlorhexidine. On babies younger than 64 months of age. How to use CHG solution Use CHG only as told by your health care provider, and follow the instructions on the label. Use the full amount of CHG as directed. Usually, this is one bottle. During a shower Follow these steps when using CHG solution during a shower (unless your health care provider gives you different instructions): Start the shower. Use your normal soap and shampoo to wash your face and hair. Turn off the shower or move out of the shower stream. Pour the CHG onto a clean washcloth. Do not use any type of brush or rough-edged sponge. Starting at your neck, lather your body down to your toes. Make sure you follow these instructions: If  you will be having surgery, pay special attention to the part of your body where you will be having surgery. Scrub this area for at least 1 minute. Do not use CHG on your head or face. If the solution gets into your ears or  eyes, rinse them well with water. Avoid your genital area. Avoid any areas of skin that have broken skin, cuts, or scrapes. Scrub your back and under your arms. Make sure to wash skin folds. Let the lather sit on your skin for 1-2 minutes or as long as told by your health care provider. Thoroughly rinse your entire body in the shower. Make sure that all body creases and crevices are rinsed well. Dry off with a clean towel. Do not put any substances on your body afterward--such as powder, lotion, or perfume--unless you are told to do so by your health care provider. Only use lotions that are recommended by the manufacturer. Put on clean clothes or pajamas. If it is the night before your surgery, sleep in clean sheets.  During a sponge bath Follow these steps when using CHG solution during a sponge bath (unless your health care provider gives you different instructions): Use your normal soap and shampoo to wash your face and hair. Pour the CHG onto a clean washcloth. Starting at your neck, lather your body down to your toes. Make sure you follow these instructions: If you will be having surgery, pay special attention to the part of your body where you will be having surgery. Scrub this area for at least 1 minute. Do not use CHG on your head or face. If the solution gets into your ears or eyes, rinse them well with water. Avoid your genital area. Avoid any areas of skin that have broken skin, cuts, or scrapes. Scrub your back and under your arms. Make sure to wash skin folds. Let the lather sit on your skin for 1-2 minutes or as long as told by your health care provider. Using a different clean, wet washcloth, thoroughly rinse your entire body. Make sure that all body creases  and crevices are rinsed well. Dry off with a clean towel. Do not put any substances on your body afterward--such as powder, lotion, or perfume--unless you are told to do so by your health care provider. Only use lotions that are recommended by the manufacturer. Put on clean clothes or pajamas. If it is the night before your surgery, sleep in clean sheets. How to use CHG prepackaged cloths Only use CHG cloths as told by your health care provider, and follow the instructions on the label. Use the CHG cloth on clean, dry skin. Do not use the CHG cloth on your head or face unless your health care provider tells you to. When washing with the CHG cloth: Avoid your genital area. Avoid any areas of skin that have broken skin, cuts, or scrapes. Before surgery Follow these steps when using a CHG cloth to clean before surgery (unless your health care provider gives you different instructions): Using the CHG cloth, vigorously scrub the part of your body where you will be having surgery. Scrub using a back-and-forth motion for 3 minutes. The area on your body should be completely wet with CHG when you are done scrubbing. Do not rinse. Discard the cloth and let the area air-dry. Do not put any substances on the area afterward, such as powder, lotion, or perfume. Put on clean clothes or pajamas. If it is the night before your surgery, sleep in clean sheets.  For general bathing Follow these steps when using CHG cloths for general bathing (unless your health care provider gives you different instructions). Use a separate CHG cloth for each area of your body. Make sure you wash between any folds of skin and  between your fingers and toes. Wash your body in the following order, switching to a new cloth after each step: The front of your neck, shoulders, and chest. Both of your arms, under your arms, and your hands. Your stomach and groin area, avoiding the genitals. Your right leg and foot. Your left leg and  foot. The back of your neck, your back, and your buttocks. Do not rinse. Discard the cloth and let the area air-dry. Do not put any substances on your body afterward--such as powder, lotion, or perfume--unless you are told to do so by your health care provider. Only use lotions that are recommended by the manufacturer. Put on clean clothes or pajamas. Contact a health care provider if: Your skin gets irritated after scrubbing. You have questions about using your solution or cloth. You swallow any chlorhexidine. Call your local poison control center (1-(831) 350-2287 in the [place] Get help right away if: Your eyes itch badly, or they become very red or swollen. Your skin itches badly and is red or swollen. Your hearing changes. You have trouble seeing. You have swelling or tingling in your mouth or throat. You have trouble breathing. These symptoms may represent a serious problem that is an emergency. Do not wait to see if the symptoms will go away. Get medical help right away. Call your local emergency services (911 in the [place] Do not drive yourself to the hospital. Summary Chlorhexidine gluconate (CHG) is a germ-killing (antiseptic) solution that is used to clean the skin. Cleaning your skin with CHG may help to lower your risk for infection. You may be given CHG to use for bathing. It may be in a bottle or in a prepackaged cloth to use on your skin. Carefully follow your health care provider's instructions and the instructions on the product label. Do not use CHG if you have a chlorhexidine allergy. Contact your health care provider if your skin gets irritated after scrubbing. This information is not intended to replace advice given to you by your health care provider. Make sure you discuss any questions you have with your health care provider. Document Revised: 12/31/2021 Document Reviewed: 11/13/2020 Elsevier Patient Education  [place].

## 2022-11-29 ENCOUNTER — Encounter (HOSPITAL_COMMUNITY)
Admission: RE | Admit: 2022-11-29 | Discharge: 2022-11-29 | Disposition: A | Discharge status: Home / Self Care | Payer: 59 | Source: Ambulatory Visit | Attending: Orthopedic Surgery | Admitting: Orthopedic Surgery

## 2022-11-29 ENCOUNTER — Encounter (HOSPITAL_COMMUNITY): Payer: Self-pay

## 2022-11-29 DIAGNOSIS — M1711 Unilateral primary osteoarthritis, right knee: Secondary | ICD-10-CM | POA: Insufficient documentation

## 2022-11-29 DIAGNOSIS — Z01812 Encounter for preprocedural laboratory examination: Secondary | ICD-10-CM | POA: Diagnosis not present

## 2022-11-29 DIAGNOSIS — Z01818 Encounter for other preprocedural examination: Secondary | ICD-10-CM

## 2022-11-29 HISTORY — DX: Gastro-esophageal reflux disease without esophagitis: K21.9

## 2022-11-29 LAB — BASIC METABOLIC PANEL
Anion gap: 10 (ref 5–15)
BUN: 13 mg/dL (ref 8–23)
CO2: 18 mmol/L — ABNORMAL LOW (ref 22–32)
Calcium: 8.4 mg/dL — ABNORMAL LOW (ref 8.9–10.3)
Chloride: 106 mmol/L (ref 98–111)
Creatinine, Ser: 0.9 mg/dL (ref 0.44–1.00)
GFR, Estimated: >60 mL/min (ref >60)
Glucose, Bld: 129 mg/dL — ABNORMAL HIGH (ref 70–99)
Potassium: 3.5 mmol/L (ref 3.5–5.1)
Sodium: 134 mmol/L — ABNORMAL LOW (ref 135–145)

## 2022-11-29 LAB — CBC WITH DIFFERENTIAL/PLATELET
Abs Immature Granulocytes: 0.02 10*3/uL (ref 0.00–0.07)
Basophils Absolute: 0 10*3/uL (ref 0.0–0.1)
Basophils Relative: 0 %
Eosinophils Absolute: 0 10*3/uL (ref 0.0–0.5)
Eosinophils Relative: 0 %
HCT: 44.3 % (ref 36.0–46.0)
Hemoglobin: 15.2 g/dL — ABNORMAL HIGH (ref 12.0–15.0)
Immature Granulocytes: 0 %
Lymphocytes Relative: 26 %
Lymphs Abs: 1.9 10*3/uL (ref 0.7–4.0)
MCH: 32.8 pg (ref 26.0–34.0)
MCHC: 34.3 g/dL (ref 30.0–36.0)
MCV: 95.7 fL (ref 80.0–100.0)
Monocytes Absolute: 0.7 10*3/uL (ref 0.1–1.0)
Monocytes Relative: 9 %
Neutro Abs: 4.8 10*3/uL (ref 1.7–7.7)
Neutrophils Relative %: 65 %
Platelets: 288 10*3/uL (ref 150–400)
RBC: 4.63 MIL/uL (ref 3.87–5.11)
RDW: 12.7 % (ref 11.5–15.5)
WBC: 7.5 10*3/uL (ref 4.0–10.5)
nRBC: 0 % (ref 0.0–0.2)

## 2022-11-29 LAB — SURGICAL PCR SCREEN
MRSA, PCR: NEGATIVE
Staphylococcus aureus: NEGATIVE

## 2022-11-29 LAB — PREPARE RBC (CROSSMATCH)

## 2022-12-04 ENCOUNTER — Other Ambulatory Visit: Payer: Self-pay | Admitting: Internal Medicine

## 2022-12-05 ENCOUNTER — Telehealth: Payer: Self-pay | Admitting: Orthopedic Surgery

## 2022-12-05 NOTE — Telephone Encounter (Signed)
Monique Warren with [facility] just let me know they can no longer accept any referrals in [facility]. I called patient told her to see if she knows any agency in [facility], I do not and have not been able to get previously. She may need outpatient therapy immediately instead of the home health for 2 weeks   Left message for her to call me back  To Dr Lemmie Evens surgery is tomorrow.

## 2022-12-06 ENCOUNTER — Ambulatory Visit (HOSPITAL_COMMUNITY): Payer: 59 | Admitting: Anesthesiology

## 2022-12-06 ENCOUNTER — Encounter (HOSPITAL_COMMUNITY)
Admission: RE | Disposition: A | Discharge status: Home Health | Payer: Self-pay | Source: Home / Self Care | Attending: Orthopedic Surgery

## 2022-12-06 ENCOUNTER — Encounter (HOSPITAL_COMMUNITY): Payer: Self-pay | Admitting: Orthopedic Surgery

## 2022-12-06 ENCOUNTER — Inpatient Hospital Stay (HOSPITAL_COMMUNITY)
Admission: RE | Admit: 2022-12-06 | Discharge: 2022-12-10 | DRG: 941 | Disposition: A | Discharge status: Home Health | Payer: 59 | Attending: Orthopedic Surgery | Admitting: Orthopedic Surgery

## 2022-12-06 ENCOUNTER — Ambulatory Visit (HOSPITAL_COMMUNITY): Payer: 59

## 2022-12-06 DIAGNOSIS — J45909 Unspecified asthma, uncomplicated: Secondary | ICD-10-CM | POA: Diagnosis present

## 2022-12-06 DIAGNOSIS — M1711 Unilateral primary osteoarthritis, right knee: Secondary | ICD-10-CM

## 2022-12-06 DIAGNOSIS — E1140 Type 2 diabetes mellitus with diabetic neuropathy, unspecified: Secondary | ICD-10-CM | POA: Diagnosis present

## 2022-12-06 DIAGNOSIS — G43909 Migraine, unspecified, not intractable, without status migrainosus: Secondary | ICD-10-CM | POA: Diagnosis present

## 2022-12-06 DIAGNOSIS — Z87891 Personal history of nicotine dependence: Secondary | ICD-10-CM

## 2022-12-06 DIAGNOSIS — I10 Essential (primary) hypertension: Secondary | ICD-10-CM | POA: Diagnosis present

## 2022-12-06 DIAGNOSIS — Z7984 Long term (current) use of oral hypoglycemic drugs: Secondary | ICD-10-CM

## 2022-12-06 DIAGNOSIS — K219 Gastro-esophageal reflux disease without esophagitis: Secondary | ICD-10-CM | POA: Diagnosis present

## 2022-12-06 DIAGNOSIS — Z96651 Presence of right artificial knee joint: Secondary | ICD-10-CM

## 2022-12-06 DIAGNOSIS — Z96652 Presence of left artificial knee joint: Secondary | ICD-10-CM | POA: Diagnosis present

## 2022-12-06 DIAGNOSIS — K449 Diaphragmatic hernia without obstruction or gangrene: Secondary | ICD-10-CM | POA: Diagnosis present

## 2022-12-06 DIAGNOSIS — T402X5A Adverse effect of other opioids, initial encounter: Secondary | ICD-10-CM | POA: Diagnosis present

## 2022-12-06 DIAGNOSIS — E119 Type 2 diabetes mellitus without complications: Secondary | ICD-10-CM

## 2022-12-06 DIAGNOSIS — E785 Hyperlipidemia, unspecified: Secondary | ICD-10-CM | POA: Diagnosis present

## 2022-12-06 DIAGNOSIS — R410 Disorientation, unspecified: Principal | ICD-10-CM | POA: Diagnosis present

## 2022-12-06 DIAGNOSIS — G8929 Other chronic pain: Secondary | ICD-10-CM | POA: Diagnosis present

## 2022-12-06 DIAGNOSIS — K2270 Barrett's esophagus without dysplasia: Secondary | ICD-10-CM | POA: Diagnosis present

## 2022-12-06 DIAGNOSIS — Z880 Allergy status to penicillin: Secondary | ICD-10-CM

## 2022-12-06 DIAGNOSIS — M810 Age-related osteoporosis without current pathological fracture: Secondary | ICD-10-CM | POA: Diagnosis present

## 2022-12-06 DIAGNOSIS — Z6836 Body mass index (BMI) 36.0-36.9, adult: Secondary | ICD-10-CM

## 2022-12-06 DIAGNOSIS — G4733 Obstructive sleep apnea (adult) (pediatric): Secondary | ICD-10-CM | POA: Diagnosis present

## 2022-12-06 DIAGNOSIS — M545 Low back pain, unspecified: Secondary | ICD-10-CM | POA: Diagnosis present

## 2022-12-06 DIAGNOSIS — R339 Retention of urine, unspecified: Secondary | ICD-10-CM | POA: Diagnosis not present

## 2022-12-06 DIAGNOSIS — Z90710 Acquired absence of both cervix and uterus: Secondary | ICD-10-CM

## 2022-12-06 DIAGNOSIS — K900 Celiac disease: Secondary | ICD-10-CM | POA: Diagnosis present

## 2022-12-06 HISTORY — PX: TOTAL KNEE ARTHROPLASTY: SHX125

## 2022-12-06 LAB — GLUCOSE, CAPILLARY
Glucose-Capillary: 118 mg/dL — ABNORMAL HIGH (ref 70–99)
Glucose-Capillary: 109 mg/dL — ABNORMAL HIGH (ref 70–99)
Glucose-Capillary: 201 mg/dL — ABNORMAL HIGH (ref 70–99)

## 2022-12-06 LAB — ABO/RH: ABO/RH(D): O POS

## 2022-12-06 SURGERY — ARTHROPLASTY, KNEE, TOTAL
Anesthesia: General | Site: Knee | Laterality: Right

## 2022-12-06 MED ORDER — ALBUTEROL SULFATE (2.5 MG/3ML) 0.083% IN NEBU: 2.5000 mg | INHALATION_SOLUTION | RESPIRATORY_TRACT | Status: DC | PRN

## 2022-12-06 MED ORDER — FENTANYL CITRATE (PF) 100 MCG/2ML IJ SOLN
INTRAMUSCULAR | Status: AC
  Filled 2022-12-06: qty 2

## 2022-12-06 MED ORDER — FENTANYL CITRATE PF 50 MCG/ML IJ SOSY
50.0000 ug | PREFILLED_SYRINGE | Freq: Once | INTRAMUSCULAR | Status: AC
  Administered 2022-12-06: 50 ug via INTRAVENOUS

## 2022-12-06 MED ORDER — ESTRADIOL 10 MCG VA TABS
10.0000 ug | ORAL_TABLET | VAGINAL | Status: DC
  Filled 2022-12-06: qty 1

## 2022-12-06 MED ORDER — MIDAZOLAM HCL 2 MG/2ML IJ SOLN
INTRAMUSCULAR | Status: AC
  Filled 2022-12-06: qty 2

## 2022-12-06 MED ORDER — EPHEDRINE SULFATE-NACL 50-0.9 MG/10ML-% IV SOSY
PREFILLED_SYRINGE | INTRAVENOUS | Status: DC | PRN
  Administered 2022-12-06 (×2): 5 mg via INTRAVENOUS

## 2022-12-06 MED ORDER — DILTIAZEM HCL ER 60 MG PO CP12
120.0000 mg | ORAL_CAPSULE | Freq: Two times a day (BID) | ORAL | Status: DC
  Administered 2022-12-07 – 2022-12-08 (×3): 120 mg via ORAL
  Filled 2022-12-06 (×11): qty 2

## 2022-12-06 MED ORDER — ATORVASTATIN CALCIUM 40 MG PO TABS
40.0000 mg | ORAL_TABLET | Freq: Every day | ORAL | Status: DC
  Administered 2022-12-06 – 2022-12-09 (×4): 40 mg via ORAL
  Filled 2022-12-06 (×4): qty 1

## 2022-12-06 MED ORDER — SODIUM CHLORIDE 0.9 % IR SOLN
Status: DC | PRN
  Administered 2022-12-06: 3000 mL

## 2022-12-06 MED ORDER — MIDAZOLAM HCL 2 MG/2ML IJ SOLN
2.0000 mg | Freq: Once | INTRAMUSCULAR | Status: AC
  Administered 2022-12-06: 2 mg via INTRAVENOUS

## 2022-12-06 MED ORDER — FENTANYL CITRATE (PF) 250 MCG/5ML IJ SOLN
INTRAMUSCULAR | Status: DC | PRN
  Administered 2022-12-06 (×3): 50 ug via INTRAVENOUS
  Administered 2022-12-06: 25 ug via INTRAVENOUS

## 2022-12-06 MED ORDER — ONDANSETRON HCL 4 MG/2ML IJ SOLN
INTRAMUSCULAR | Status: DC | PRN
  Administered 2022-12-06: 4 mg via INTRAVENOUS

## 2022-12-06 MED ORDER — TRAMADOL HCL 50 MG PO TABS
50.0000 mg | ORAL_TABLET | Freq: Four times a day (QID) | ORAL | Status: DC
  Administered 2022-12-06 – 2022-12-10 (×13): 50 mg via ORAL
  Filled 2022-12-06 (×15): qty 1

## 2022-12-06 MED ORDER — TRANEXAMIC ACID-NACL 1000-0.7 MG/100ML-% IV SOLN
INTRAVENOUS | Status: AC
  Filled 2022-12-06: qty 100

## 2022-12-06 MED ORDER — METHOCARBAMOL 500 MG PO TABS
500.0000 mg | ORAL_TABLET | Freq: Four times a day (QID) | ORAL | Status: DC | PRN
  Administered 2022-12-08: 500 mg via ORAL
  Filled 2022-12-06: qty 1

## 2022-12-06 MED ORDER — LINACLOTIDE 72 MCG PO CAPS
72.0000 ug | ORAL_CAPSULE | Freq: Every day | ORAL | Status: DC
  Administered 2022-12-07 – 2022-12-10 (×4): 72 ug via ORAL
  Filled 2022-12-06 (×5): qty 1

## 2022-12-06 MED ORDER — ONDANSETRON HCL 4 MG/2ML IJ SOLN
4.0000 mg | Freq: Four times a day (QID) | INTRAMUSCULAR | Status: DC
  Administered 2022-12-06: 4 mg via INTRAVENOUS
  Filled 2022-12-06 (×5): qty 2

## 2022-12-06 MED ORDER — HYDROCODONE-ACETAMINOPHEN 5-325 MG PO TABS
1.0000 | ORAL_TABLET | ORAL | Status: DC | PRN
  Administered 2022-12-07: 1 via ORAL
  Filled 2022-12-06: qty 1

## 2022-12-06 MED ORDER — SCOPOLAMINE 1 MG/3DAYS TD PT72
1.0000 | MEDICATED_PATCH | Freq: Once | TRANSDERMAL | Status: DC
  Filled 2022-12-06: qty 1

## 2022-12-06 MED ORDER — MENTHOL 3 MG MT LOZG
1.0000 | LOZENGE | OROMUCOSAL | Status: DC | PRN
  Administered 2022-12-08: 3 mg via ORAL
  Filled 2022-12-06 (×2): qty 9

## 2022-12-06 MED ORDER — METOCLOPRAMIDE HCL 5 MG/ML IJ SOLN
10.0000 mg | Freq: Once | INTRAMUSCULAR | Status: AC
  Administered 2022-12-06: 10 mg via INTRAVENOUS

## 2022-12-06 MED ORDER — HYDROMORPHONE HCL 1 MG/ML IJ SOLN
0.2500 mg | INTRAMUSCULAR | Status: DC | PRN
  Administered 2022-12-06: 0.5 mg via INTRAVENOUS
  Filled 2022-12-06: qty 0.5

## 2022-12-06 MED ORDER — PHENOL 1.4 % MT LIQD
1.0000 | OROMUCOSAL | Status: DC | PRN
  Filled 2022-12-06: qty 177

## 2022-12-06 MED ORDER — VANCOMYCIN HCL IN DEXTROSE 1-5 GM/200ML-% IV SOLN
1000.0000 mg | Freq: Two times a day (BID) | INTRAVENOUS | Status: AC
  Administered 2022-12-06: 1000 mg via INTRAVENOUS
  Filled 2022-12-06: qty 200

## 2022-12-06 MED ORDER — ASPIRIN 325 MG PO TBEC
325.0000 mg | DELAYED_RELEASE_TABLET | Freq: Every day | ORAL | Status: DC
  Administered 2022-12-07 – 2022-12-10 (×4): 325 mg via ORAL
  Filled 2022-12-06 (×4): qty 1

## 2022-12-06 MED ORDER — ONDANSETRON HCL 4 MG/2ML IJ SOLN
INTRAMUSCULAR | Status: AC
  Filled 2022-12-06: qty 2

## 2022-12-06 MED ORDER — KETAMINE HCL 10 MG/ML IJ SOLN
INTRAMUSCULAR | Status: DC | PRN
  Administered 2022-12-06: 20 mg via INTRAVENOUS

## 2022-12-06 MED ORDER — PROPOFOL 10 MG/ML IV BOLUS
INTRAVENOUS | Status: AC
  Filled 2022-12-06: qty 20

## 2022-12-06 MED ORDER — PROPOFOL 10 MG/ML IV BOLUS
INTRAVENOUS | Status: DC | PRN
  Administered 2022-12-06: 150 mg via INTRAVENOUS

## 2022-12-06 MED ORDER — DULOXETINE HCL 60 MG PO CPEP
60.0000 mg | ORAL_CAPSULE | Freq: Every day | ORAL | Status: DC
  Administered 2022-12-06 – 2022-12-09 (×4): 60 mg via ORAL
  Filled 2022-12-06 (×4): qty 1

## 2022-12-06 MED ORDER — VANCOMYCIN HCL IN DEXTROSE 1-5 GM/200ML-% IV SOLN
INTRAVENOUS | Status: AC
  Filled 2022-12-06: qty 200

## 2022-12-06 MED ORDER — TRANEXAMIC ACID-NACL 1000-0.7 MG/100ML-% IV SOLN
1000.0000 mg | Freq: Once | INTRAVENOUS | Status: AC
  Administered 2022-12-06: 1000 mg via INTRAVENOUS
  Filled 2022-12-06 (×2): qty 100

## 2022-12-06 MED ORDER — CHLORHEXIDINE GLUCONATE 0.12 % MT SOLN
OROMUCOSAL | Status: AC
  Administered 2022-12-06: 15 mL
  Filled 2022-12-06: qty 15

## 2022-12-06 MED ORDER — DILTIAZEM HCL ER 90 MG PO CP12: 240.0000 mg | ORAL_CAPSULE | Freq: Every day | ORAL | Status: DC

## 2022-12-06 MED ORDER — SEMAGLUTIDE (2 MG/DOSE) 8 MG/3ML ~~LOC~~ SOPN: 2.0000 mg | PEN_INJECTOR | SUBCUTANEOUS | Status: DC

## 2022-12-06 MED ORDER — LACTATED RINGERS IV SOLN: INTRAVENOUS | Status: DC

## 2022-12-06 MED ORDER — ROPIVACAINE HCL 5 MG/ML IJ SOLN
INTRAMUSCULAR | Status: AC
  Filled 2022-12-06: qty 30

## 2022-12-06 MED ORDER — METHOCARBAMOL 1000 MG/10ML IJ SOLN: 500.0000 mg | Freq: Four times a day (QID) | INTRAVENOUS | Status: DC | PRN

## 2022-12-06 MED ORDER — BUPIVACAINE-MELOXICAM ER 200-6 MG/7ML IJ SOLN
INTRAMUSCULAR | Status: DC | PRN
  Administered 2022-12-06: 400 mg

## 2022-12-06 MED ORDER — NAPROXEN 250 MG PO TABS
250.0000 mg | ORAL_TABLET | Freq: Two times a day (BID) | ORAL | Status: DC
  Administered 2022-12-07 – 2022-12-10 (×7): 250 mg via ORAL
  Filled 2022-12-06 (×8): qty 1

## 2022-12-06 MED ORDER — SCOPOLAMINE 1 MG/3DAYS TD PT72
MEDICATED_PATCH | TRANSDERMAL | Status: AC
  Administered 2022-12-06: 1.5 mg
  Filled 2022-12-06: qty 1

## 2022-12-06 MED ORDER — ALBUTEROL SULFATE HFA 108 (90 BASE) MCG/ACT IN AERS: 2.0000 | INHALATION_SPRAY | RESPIRATORY_TRACT | Status: DC | PRN

## 2022-12-06 MED ORDER — IBUPROFEN 400 MG PO TABS
400.0000 mg | ORAL_TABLET | Freq: Once | ORAL | Status: AC
  Administered 2022-12-06: 400 mg via ORAL
  Filled 2022-12-06: qty 1

## 2022-12-06 MED ORDER — METOPROLOL SUCCINATE ER 25 MG PO TB24
25.0000 mg | ORAL_TABLET | Freq: Every day | ORAL | Status: DC
  Administered 2022-12-07 – 2022-12-10 (×4): 25 mg via ORAL
  Filled 2022-12-06 (×4): qty 1

## 2022-12-06 MED ORDER — SODIUM CHLORIDE 0.9 % IV SOLN: INTRAVENOUS | Status: DC

## 2022-12-06 MED ORDER — LIDOCAINE HCL (PF) 1 % IJ SOLN
INTRAMUSCULAR | Status: DC | PRN
  Administered 2022-12-06: 4 mL

## 2022-12-06 MED ORDER — EPHEDRINE 5 MG/ML INJ
INTRAVENOUS | Status: AC
  Filled 2022-12-06: qty 5

## 2022-12-06 MED ORDER — METOCLOPRAMIDE HCL 5 MG/ML IJ SOLN
INTRAMUSCULAR | Status: AC
  Filled 2022-12-06: qty 2

## 2022-12-06 MED ORDER — PROPOFOL 500 MG/50ML IV EMUL
INTRAVENOUS | Status: AC
  Filled 2022-12-06: qty 100

## 2022-12-06 MED ORDER — ROPIVACAINE HCL 5 MG/ML IJ SOLN
INTRAMUSCULAR | Status: DC | PRN
  Administered 2022-12-06: 30 mL via PERINEURAL

## 2022-12-06 MED ORDER — FENTANYL CITRATE PF 50 MCG/ML IJ SOSY
PREFILLED_SYRINGE | INTRAMUSCULAR | Status: AC
  Filled 2022-12-06: qty 1

## 2022-12-06 MED ORDER — DOCUSATE SODIUM 100 MG PO CAPS
100.0000 mg | ORAL_CAPSULE | Freq: Two times a day (BID) | ORAL | Status: DC
  Administered 2022-12-06 – 2022-12-10 (×8): 100 mg via ORAL
  Filled 2022-12-06 (×8): qty 1

## 2022-12-06 MED ORDER — METOCLOPRAMIDE HCL 10 MG PO TABS: 5.0000 mg | ORAL_TABLET | Freq: Three times a day (TID) | ORAL | Status: DC | PRN

## 2022-12-06 MED ORDER — PHENYLEPHRINE HCL-NACL 20-0.9 MG/250ML-% IV SOLN
INTRAVENOUS | Status: DC | PRN
  Administered 2022-12-06 (×2): 80 ug via INTRAVENOUS
  Administered 2022-12-06: 20 ug/min via INTRAVENOUS

## 2022-12-06 MED ORDER — PANTOPRAZOLE SODIUM 40 MG PO TBEC
40.0000 mg | DELAYED_RELEASE_TABLET | Freq: Every day | ORAL | Status: DC
  Administered 2022-12-06 – 2022-12-10 (×5): 40 mg via ORAL
  Filled 2022-12-06 (×5): qty 1

## 2022-12-06 MED ORDER — METOCLOPRAMIDE HCL 5 MG/ML IJ SOLN: 10.0000 mg | Freq: Once | INTRAMUSCULAR | Status: AC

## 2022-12-06 MED ORDER — PHENYLEPHRINE HCL-NACL 20-0.9 MG/250ML-% IV SOLN
INTRAVENOUS | Status: AC
  Filled 2022-12-06: qty 250

## 2022-12-06 MED ORDER — ONDANSETRON HCL 4 MG/2ML IJ SOLN: 4.0000 mg | Freq: Four times a day (QID) | INTRAMUSCULAR | Status: DC | PRN

## 2022-12-06 MED ORDER — MOMETASONE FURO-FORMOTEROL FUM 200-5 MCG/ACT IN AERO
2.0000 | INHALATION_SPRAY | Freq: Two times a day (BID) | RESPIRATORY_TRACT | Status: DC
  Administered 2022-12-06 – 2022-12-10 (×8): 2 via RESPIRATORY_TRACT
  Filled 2022-12-06: qty 8.8

## 2022-12-06 MED ORDER — TRANEXAMIC ACID-NACL 1000-0.7 MG/100ML-% IV SOLN
1000.0000 mg | INTRAVENOUS | Status: AC
  Administered 2022-12-06: 1000 mg via INTRAVENOUS

## 2022-12-06 MED ORDER — HYDROCODONE-ACETAMINOPHEN 7.5-325 MG PO TABS: 1.0000 | ORAL_TABLET | ORAL | Status: DC | PRN

## 2022-12-06 MED ORDER — METOCLOPRAMIDE HCL 5 MG/ML IJ SOLN: 5.0000 mg | Freq: Three times a day (TID) | INTRAMUSCULAR | Status: DC | PRN

## 2022-12-06 MED ORDER — VITAMIN D 25 MCG (1000 UNIT) PO TABS
5000.0000 [IU] | ORAL_TABLET | Freq: Every day | ORAL | Status: DC
  Administered 2022-12-06 – 2022-12-10 (×5): 5000 [IU] via ORAL
  Filled 2022-12-06 (×5): qty 5

## 2022-12-06 MED ORDER — ADULT MULTIVITAMIN W/MINERALS CH
1.0000 | ORAL_TABLET | Freq: Every day | ORAL | Status: DC
  Administered 2022-12-06 – 2022-12-10 (×5): 1 via ORAL
  Filled 2022-12-06 (×5): qty 1

## 2022-12-06 MED ORDER — 0.9 % SODIUM CHLORIDE (POUR BTL) OPTIME
TOPICAL | Status: DC | PRN
  Administered 2022-12-06: 1000 mL

## 2022-12-06 MED ORDER — MEPERIDINE HCL 50 MG/ML IJ SOLN: 6.2500 mg | INTRAMUSCULAR | Status: DC | PRN

## 2022-12-06 MED ORDER — ACETAMINOPHEN 325 MG PO TABS: 325.0000 mg | ORAL_TABLET | Freq: Four times a day (QID) | ORAL | Status: DC | PRN

## 2022-12-06 MED ORDER — ONDANSETRON HCL 4 MG/2ML IJ SOLN
4.0000 mg | Freq: Once | INTRAMUSCULAR | Status: AC | PRN
  Administered 2022-12-06: 4 mg via INTRAVENOUS

## 2022-12-06 MED ORDER — LIDOCAINE HCL (PF) 2 % IJ SOLN
INTRAMUSCULAR | Status: AC
  Filled 2022-12-06: qty 5

## 2022-12-06 MED ORDER — LIDOCAINE HCL (PF) 1 % IJ SOLN
INTRAMUSCULAR | Status: AC
  Filled 2022-12-06: qty 30

## 2022-12-06 MED ORDER — ROCURONIUM BROMIDE 10 MG/ML (PF) SYRINGE
PREFILLED_SYRINGE | INTRAVENOUS | Status: DC | PRN
  Administered 2022-12-06: 50 mg via INTRAVENOUS

## 2022-12-06 MED ORDER — VENLAFAXINE HCL 37.5 MG PO TABS: 37.5000 mg | ORAL_TABLET | Freq: Two times a day (BID) | ORAL | Status: DC

## 2022-12-06 MED ORDER — DEXAMETHASONE SODIUM PHOSPHATE 10 MG/ML IJ SOLN
INTRAMUSCULAR | Status: AC
  Filled 2022-12-06: qty 1

## 2022-12-06 MED ORDER — FLUTICASONE-SALMETEROL(SENSOR) 232-14 MCG/ACT IN AEPB: 2.0000 | INHALATION_SPRAY | Freq: Two times a day (BID) | RESPIRATORY_TRACT | Status: DC

## 2022-12-06 MED ORDER — ORAL CARE MOUTH RINSE: 15.0000 mL | Freq: Once | OROMUCOSAL | Status: AC

## 2022-12-06 MED ORDER — MORPHINE SULFATE (PF) 2 MG/ML IV SOLN: 0.5000 mg | INTRAVENOUS | Status: DC | PRN

## 2022-12-06 MED ORDER — ROCURONIUM BROMIDE 10 MG/ML (PF) SYRINGE
PREFILLED_SYRINGE | INTRAVENOUS | Status: AC
  Filled 2022-12-06: qty 10

## 2022-12-06 MED ORDER — DEXAMETHASONE SODIUM PHOSPHATE 10 MG/ML IJ SOLN
INTRAMUSCULAR | Status: DC | PRN
  Administered 2022-12-06: 10 mg via INTRAVENOUS

## 2022-12-06 MED ORDER — CHLORHEXIDINE GLUCONATE 0.12 % MT SOLN
15.0000 mL | Freq: Once | OROMUCOSAL | Status: AC
  Administered 2022-12-06: 15 mL via OROMUCOSAL

## 2022-12-06 MED ORDER — POVIDONE-IODINE 10 % EX SWAB
2.0000 | Freq: Once | CUTANEOUS | Status: AC
  Administered 2022-12-06: 2 via TOPICAL

## 2022-12-06 MED ORDER — DEXAMETHASONE SODIUM PHOSPHATE 10 MG/ML IJ SOLN
10.0000 mg | Freq: Once | INTRAMUSCULAR | Status: AC
  Administered 2022-12-07: 10 mg via INTRAVENOUS
  Filled 2022-12-06: qty 1

## 2022-12-06 MED ORDER — PREGABALIN 50 MG PO CAPS
50.0000 mg | ORAL_CAPSULE | Freq: Three times a day (TID) | ORAL | Status: DC
  Administered 2022-12-06 – 2022-12-07 (×3): 50 mg via ORAL
  Filled 2022-12-06 (×3): qty 1

## 2022-12-06 MED ORDER — POLYETHYLENE GLYCOL 3350 17 G PO PACK: 17.0000 g | PACK | Freq: Every day | ORAL | Status: DC | PRN

## 2022-12-06 MED ORDER — SURGIRINSE WOUND IRRIGATION SYSTEM - OPTIME
TOPICAL | Status: DC | PRN
  Administered 2022-12-06: 450 mL via TOPICAL

## 2022-12-06 MED ORDER — BUPIVACAINE-MELOXICAM ER 200-6 MG/7ML IJ SOLN
INTRAMUSCULAR | Status: AC
  Filled 2022-12-06: qty 2

## 2022-12-06 MED ORDER — FAMOTIDINE 20 MG PO TABS
40.0000 mg | ORAL_TABLET | Freq: Every day | ORAL | Status: DC
  Administered 2022-12-06 – 2022-12-09 (×4): 40 mg via ORAL
  Filled 2022-12-06 (×4): qty 2

## 2022-12-06 MED ORDER — VANCOMYCIN HCL IN DEXTROSE 1-5 GM/200ML-% IV SOLN
1000.0000 mg | INTRAVENOUS | Status: AC
  Administered 2022-12-06: 1000 mg via INTRAVENOUS

## 2022-12-06 MED ORDER — ONDANSETRON HCL 4 MG PO TABS: 4.0000 mg | ORAL_TABLET | Freq: Four times a day (QID) | ORAL | Status: DC | PRN

## 2022-12-06 MED ORDER — SUGAMMADEX SODIUM 200 MG/2ML IV SOLN
INTRAVENOUS | Status: DC | PRN
  Administered 2022-12-06: 180.6 mg via INTRAVENOUS

## 2022-12-06 MED ORDER — METHOCARBAMOL 1000 MG/10ML IJ SOLN
500.0000 mg | Freq: Four times a day (QID) | INTRAVENOUS | Status: DC
  Administered 2022-12-06 – 2022-12-08 (×5): 500 mg via INTRAVENOUS
  Filled 2022-12-06: qty 500
  Filled 2022-12-06: qty 5

## 2022-12-06 MED ORDER — METHOCARBAMOL 1000 MG/10ML IJ SOLN
INTRAMUSCULAR | Status: AC
  Filled 2022-12-06: qty 10

## 2022-12-06 MED ORDER — OXYCODONE HCL 5 MG PO TABS
5.0000 mg | ORAL_TABLET | ORAL | Status: DC
  Administered 2022-12-06 – 2022-12-07 (×3): 5 mg via ORAL
  Filled 2022-12-06 (×5): qty 1

## 2022-12-06 SURGICAL SUPPLY — 66 items
ATTUNE MED DOME PAT 38 KNEE (Knees) IMPLANT
ATTUNE PSFEM RTSZ6 NARCEM KNEE (Femur) IMPLANT
BANDAGE ESMARK 6X9 LF (GAUZE/BANDAGES/DRESSINGS) ×1 IMPLANT
BASEPLATE TIB CMT FB PCKT SZ4 (Stem) IMPLANT
BLADE SAGITTAL 25.0X1.27X90 (BLADE) ×1 IMPLANT
BLADE SAW SGTL 11.0X1.19X90.0M (BLADE) ×1 IMPLANT
BLADE SURG SZ10 CARB STEEL (BLADE) ×1 IMPLANT
BNDG CMPR 9X6 STRL LF SNTH (GAUZE/BANDAGES/DRESSINGS) ×1
BNDG CMPR STD VLCR NS LF 5.8X4 (GAUZE/BANDAGES/DRESSINGS) ×2
BNDG CMPR STD VLCR NS LF 5.8X6 (GAUZE/BANDAGES/DRESSINGS) ×1
BNDG ELASTIC 4X5.8 VLCR NS LF (GAUZE/BANDAGES/DRESSINGS) ×2 IMPLANT
BNDG ELASTIC 6X5.8 VLCR NS LF (GAUZE/BANDAGES/DRESSINGS) ×1 IMPLANT
BNDG ESMARK 6X9 LF (GAUZE/BANDAGES/DRESSINGS) ×1
BSPLAT TIB 4 CMNT FX BRNG STRL (Stem) ×1 IMPLANT
CEMENT HV SMART SET (Cement) ×2 IMPLANT
CLOTH BEACON ORANGE TIMEOUT ST (SAFETY) ×1 IMPLANT
COOLER ICEMAN CLASSIC (MISCELLANEOUS) ×1 IMPLANT
COVER LIGHT HANDLE STERIS (MISCELLANEOUS) ×2 IMPLANT
CUFF TOURN SGL QUICK 34 (TOURNIQUET CUFF) ×1
CUFF TRNQT CYL 34X4.125X (TOURNIQUET CUFF) ×1 IMPLANT
DRAPE BACK TABLE (DRAPES) ×1 IMPLANT
DRAPE EXTREMITY T 121X128X90 (DISPOSABLE) ×1 IMPLANT
DRESSING AQUACEL AG ADV 3.5X12 (MISCELLANEOUS) ×1 IMPLANT
DRSG AQUACEL AG ADV 3.5X10 (GAUZE/BANDAGES/DRESSINGS) IMPLANT
DRSG AQUACEL AG ADV 3.5X12 (MISCELLANEOUS) ×1
DURAPREP 26ML APPLICATOR (WOUND CARE) ×2 IMPLANT
ELECT REM PT RETURN 9FT ADLT (ELECTROSURGICAL) ×1
ELECTRODE REM PT RTRN 9FT ADLT (ELECTROSURGICAL) ×1 IMPLANT
GLOVE BIO SURGEON STRL SZ7 (GLOVE) IMPLANT
GLOVE BIOGEL PI IND STRL 7.0 (GLOVE) ×3 IMPLANT
GLOVE BIOGEL PI IND STRL 8.5 (GLOVE) ×1 IMPLANT
GLOVE SKINSENSE STRL SZ8.0 LF (GLOVE) ×1 IMPLANT
GOWN STRL REUS W/TWL LRG LVL3 (GOWN DISPOSABLE) ×3 IMPLANT
GOWN STRL REUS W/TWL XL LVL3 (GOWN DISPOSABLE) ×1 IMPLANT
HANDPIECE INTERPULSE COAX TIP (DISPOSABLE) ×1
HOOD W/PEELAWAY (MISCELLANEOUS) ×4 IMPLANT
INSERT TIB ATTUNE FB SZ6X6 (Insert) IMPLANT
INSERT TIB PS FB ATTUNE SZ6X5 (Knees) IMPLANT
INST SET MAJOR BONE (KITS) ×1 IMPLANT
IV NS IRRIG 3000ML ARTHROMATIC (IV SOLUTION) ×1 IMPLANT
KIT BLADEGUARD II DBL (SET/KITS/TRAYS/PACK) ×1 IMPLANT
KIT TURNOVER KIT A (KITS) ×1 IMPLANT
MANIFOLD NEPTUNE II (INSTRUMENTS) ×1 IMPLANT
MARKER SKIN DUAL TIP RULER LAB (MISCELLANEOUS) ×1 IMPLANT
NS IRRIG 1000ML POUR BTL (IV SOLUTION) ×1 IMPLANT
PACK TOTAL JOINT (CUSTOM PROCEDURE TRAY) ×1 IMPLANT
PAD ARMBOARD 7.5X6 YLW CONV (MISCELLANEOUS) ×1 IMPLANT
PAD COLD SHLDR SM WRAP-ON (PAD) ×1 IMPLANT
PILLOW KNEE EXTENSION 0 DEG (MISCELLANEOUS) ×1 IMPLANT
PIN/DRILL PACK ORTHO 1/8X3.0 (PIN) ×1 IMPLANT
SAW OSC TIP CART 19.5X105X1.3 (SAW) ×1 IMPLANT
SET BASIN LINEN APH (SET/KITS/TRAYS/PACK) ×1 IMPLANT
SET HNDPC FAN SPRY TIP SCT (DISPOSABLE) ×1 IMPLANT
SOLUTION IRRIG SURGIPHOR (IV SOLUTION) IMPLANT
SPONGE T-LAP 18X18 ~~LOC~~+RFID (SPONGE) IMPLANT
STAPLER VISISTAT 35W (STAPLE) ×1 IMPLANT
SUT BRALON NAB BRD #1 30IN (SUTURE) ×1 IMPLANT
SUT MNCRL 0 VIOLET CTX 36 (SUTURE) ×1 IMPLANT
SUT MON AB 0 CT1 (SUTURE) ×1 IMPLANT
SUT MONOCRYL 0 CTX 36 (SUTURE) ×1
SYR BULB IRRIG 60ML STRL (SYRINGE) ×1 IMPLANT
TOWEL OR 17X26 4PK STRL BLUE (TOWEL DISPOSABLE) ×1 IMPLANT
TOWER CARTRIDGE SMART MIX (DISPOSABLE) ×1 IMPLANT
TRAY FOLEY MTR SLVR 16FR STAT (SET/KITS/TRAYS/PACK) ×1 IMPLANT
WATER STERILE IRR 1000ML POUR (IV SOLUTION) ×2 IMPLANT
YANKAUER SUCT 12FT TUBE ARGYLE (SUCTIONS) ×1 IMPLANT

## 2022-12-06 NOTE — H&P (Signed)
SUMMARY:       Encounter Diagnosis  Name Primary?   Primary osteoarthritis of right knee Yes      72 year old female with bilateral knee arthritis scheduled for left total knee arthroplasty but complained of severe right knee pain and was brought in for evaluation found to have arthritis of the right knee as well.  She is adamant that she is not having any back pain related to this or radicular symptoms and her pain is all in the right knee on the medial side   She did have anti-inflammatories for the left knee which should have helped the right knee if at all she needed   We have therefore changed her to a right total knee arthroplasty after imaging showed grade 3 disease   No orders of the defined types were placed in this encounter.           Chief Complaint  Patient presents with   Knee Pain      Right, states right knee is worse than left (cancelled left total knee replacement because right is more painful)      72 year old female with bilateral knee arthritis scheduled for left total knee arthroplasty but complained of severe right knee pain and was brought in for evaluation found to have arthritis of the right knee as well.  She is adamant that she is not having any back pain related to this or radicular symptoms and her pain is all in the right knee on the medial side   She did have anti-inflammatories for the left knee which should have helped the right knee if at all she needed   We have therefore changed her to a right total knee arthroplasty after imaging showed grade 3 disease           Review of Systems  Respiratory:  Negative for shortness of breath.   Cardiovascular:  Negative for chest pain.  Musculoskeletal:        I am concerned about the patient's symptoms.  She does have some chronic back pain and some chronic radicular type symptoms.  She assures me today that her right leg pain is not radiating from her back.  It is mostly in the right knee she does have  some lateral lower leg pain but says this pain is radiating from the knee itself.      Body mass index is 36.4 kg/m.         Constitutional:  BP 139/79 Comment: 11/08/22  Ht 5\' 2"  (1.575 m)   Wt 199 lb (90.3 kg)   BMI 36.40 kg/m    General appearance: normal no deformities normal grooming and hygiene   CDV: Normal pulses and perfusion normal temperature without tenderness no swelling or varicosities,  noedema   Musculoskeletal Gait is abnormal, is hard to tell which knee is bothering her more it seems like the right side is worse   Physical Exam Musculoskeletal:     Right knee: Effusion, bony tenderness and crepitus present. No swelling, deformity, erythema, ecchymosis or lacerations. Tenderness present over the medial joint line. No LCL laxity, MCL laxity, ACL laxity or PCL laxity. Normal alignment.     Instability Tests: Anterior drawer test positive.     Left knee: Effusion, bony tenderness and crepitus present. No swelling, deformity, erythema, ecchymosis or lacerations. Tenderness present over the medial joint line. No LCL laxity, MCL laxity or ACL laxity.Normal alignment.     Instability Tests: Anterior drawer test negative.  Comments: She really has diffuse tenderness around both knees.  She has some tenderness in the anterolateral compartment suggesting L5 root irritation, however she assures me that she has no radicular symptoms although she has lower back tenderness and pain as well          Skin normal without rash lesion ulcers or caf au lait spots   Neuro normal sensation normal coordination normal reflexes   Psychiatric awake alert and oriented x3 without depression anxiety or agitation         Past Medical History:  Diagnosis Date   Anxiety     Arthritis     Asthma     Celiac disease     Depression     Diabetes mellitus, type II ([place])     Diabetic neuropathy (HCC)     Diastolic dysfunction      Grade 1 with preserved EF   Dysrhythmia     Heart  murmur     Hiatal hernia      Sliding   Hyperlipidemia     Hypertension     Migraine     Obstructive sleep apnea     Osteoporosis     Vertigo           Past Surgical History:  Procedure Laterality Date   ABDOMINAL HYSTERECTOMY   09/29/2003   APPENDECTOMY       BALLOON DILATION N/A 02/08/2022    Procedure: BALLOON DILATION;  Surgeon: Eloise Harman, DO;  Location: [facility];  Service: Endoscopy;  Laterality: N/A;   BIOPSY   02/08/2022    Procedure: BIOPSY;  Surgeon: Eloise Harman, DO;  Location: [facility];  Service: Endoscopy;;   BREAST BIOPSY Left     CARPAL TUNNEL RELEASE       CATARACT EXTRACTION Bilateral      October and November 2016   COLONOSCOPY   09/2017    [name], MD in [place]; 5 mm tubular adenoma in the descending colon s/p resected, mild sigmoid diverticulosis, internal hemorrhoids.  Recommended repeat colonoscopy in 5 years.   CRANIOTOMY FOR ANEURYSM / VERTEBROBASILAR / CAROTID CIRCULATION Right 10/24/2015   ESOPHAGOGASTRODUODENOSCOPY   09/2017    [name]; irregular Z-line s/p biopsy, decreased LES tone, 4 cm hiatal hernia, erythema in gastric antrum biopsied, normal examined duodenum biopsied.  Esophageal biopsy suggestive of reflux, no Barrett's, gastric biopsy with nonspecific chronic gastritis with intestinal metaplasia, duodenal biopsy with increased intraepithelial lymphocytes without blunting of villi, nonspecific.   ESOPHAGOGASTRODUODENOSCOPY (EGD) WITH PROPOFOL N/A 02/08/2022    Surgeon: Eloise Harman, DO; 2 cm hiatal hernia, short segment Barrett's esophagus without dysplasia, mild Schatzki's ring dilated, gastritis with biopsies benign, normal examined duodenum.  Repeat EGD in 5 years.   KNEE ARTHROSCOPY WITH MEDIAL MENISECTOMY Left 04/09/2022    Procedure: KNEE ARTHROSCOPY WITH PARTIAL MEDIAL MENISCECTOMY, LATERAL MENISCAL DEBRIDEMENT;  Surgeon: Carole Civil, MD;  Location: [facility];  Service: Orthopedics;  Laterality: Left;    KNEE ARTHROSCOPY WITH MENISCAL REPAIR Left 04/09/2022    Procedure: KNEE ARTHROSCOPY WITH MEDIAL MENISCAL REPAIR;  Surgeon: Carole Civil, MD;  Location: [facility];  Service: Orthopedics;  Laterality: Left;   LAPAROSCOPY       UTERINE FIBROID SURGERY        Social History         Tobacco Use   Smoking status: Former      Packs/day: 0.50      Types: Cigarettes   Smokeless tobacco: Never  Vaping Use   Vaping Use: Never used  Substance Use Topics   Alcohol use: Yes      Alcohol/week: 4.0 standard drinks of alcohol      Types: 2 Glasses of wine, 2 Shots of liquor per week      Comment: rarely   Drug use: Never        Assessment and Plan:   Imaging: I have personally reviewed the images and my personal interpretation of the images: Images today show osteoarthritis of the right knee as well.  There is valgus alignment there are multiple osteophytes surrounding the joint with joint space narrowing which is not severe but noticeable.  I would grade this as grade 3 disease       Encounter Diagnosis  Name Primary?   Primary osteoarthritis of right knee Yes        The procedure has been fully reviewed with the patient; The risks and benefits of surgery have been discussed and explained and understood. Alternative treatment has also been reviewed, questions were encouraged and answered. The postoperative plan is also been reviewed.     Plan is for right total knee arthroplasty

## 2022-12-06 NOTE — Brief Op Note (Signed)
12/06/2022  1:12 PM  PATIENT:  Monique Warren  72 y.o. female  PRE-OPERATIVE DIAGNOSIS:  Right knee osteoarthritis  POST-OPERATIVE DIAGNOSIS:  Right knee osteoarthritis  PROCEDURE:  Procedure(s): TOTAL KNEE ARTHROPLASTY (Right)  SURGEON:  Surgeon(s) and Role:    Carole Civil, MD - Primary  PHYSICIAN ASSISTANT:   ASSISTANTS: cynthia wrenn / kaitlin raines    ANESTHESIA:   general and adductor canal block   EBL:  5 mL   BLOOD ADMINISTERED:none  DRAINS: none   LOCAL MEDICATIONS USED:  OTHER zinrelef  SPECIMEN:  No Specimen  DISPOSITION OF SPECIMEN:  N/A  COUNTS:  YES  TOURNIQUET:   Total Tourniquet Time Documented: Thigh (Right) - 91 minutes Total: Thigh (Right) - 91 minutes   DICTATION: .Viviann Spare Dictation  PLAN OF CARE: Admit for overnight observation  PATIENT DISPOSITION:  PACU - hemodynamically stable.   Delay start of Pharmacological VTE agent (>24hrs) due to surgical blood loss or risk of bleeding: not applicable

## 2022-12-06 NOTE — Progress Notes (Signed)
Patient received to floor @1604  somnolent but aroused by voice, accompanied by husband from PACU. Right knee unobservable due to surgical dressings, ice brace applied to right knee. Held 6 pm dose of OxyContin due to lethargy, was able to swallow pills whole with water. Patient oriented to room and and staff resting quietly with eyes closed and call bell in reach.

## 2022-12-06 NOTE — Interval H&P Note (Signed)
History and Physical Interval Note:  12/06/2022 10:34 AM  Monique Warren  has presented today for surgery, with the diagnosis of Right knee osteoarthritis.  The various methods of treatment have been discussed with the patient and family. After consideration of risks, benefits and other options for treatment, the patient has consented to  Procedure(s): TOTAL KNEE ARTHROPLASTY (Right) as a surgical intervention.  The patient's history has been reviewed, patient examined, no change in status, stable for surgery.  I have reviewed the patient's chart and labs.  Questions were answered to the patient's satisfaction.     Arther Abbott

## 2022-12-06 NOTE — Anesthesia Procedure Notes (Signed)
Anesthesia Regional Block: Adductor canal block   Pre-Anesthetic Checklist: , timeout performed,  Correct Patient, Correct Site, Correct Laterality,  Correct Procedure, Correct Position, site marked,  Risks and benefits discussed,  Surgical consent,  Pre-op evaluation,  At surgeon's request and post-op pain management  Laterality: Right and Lower  Prep: chloraprep       Needles:  Injection technique: Single-shot  Needle Type: Echogenic Stimulator Needle     Needle Length: 10cm  Needle Gauge: 20   Needle insertion depth: 7 cm   Additional Needles:   Procedures:, nerve stimulator,,, ultrasound used (permanent image in chart),,     Nerve Stimulator or Paresthesia:  Response: Twitch elicited, 0.8 mA, 7 cm  Additional Responses:   Narrative:  Start time: 12/06/2022 10:14 AM End time: 12/06/2022 10:20 AM Injection made incrementally with aspirations every 5 mL.  Performed by: Personally  Anesthesiologist: Denese Killings, MD  Additional Notes: BP cuff, EKG monitors applied. Sedation begun. After nerve location anesthetic injected incrementally, slowly , and after neg aspirations. Tolerated well.

## 2022-12-06 NOTE — Addendum Note (Signed)
Addendum  created 12/06/22 1730 by Denese Killings, MD   Child order released for a procedure order, Clinical Note Signed, Intraprocedure Blocks edited, Intraprocedure Meds edited, SmartForm saved

## 2022-12-06 NOTE — Anesthesia Preprocedure Evaluation (Addendum)
Anesthesia Evaluation  Patient identified by MRN, date of birth, ID band Patient awake    Reviewed: Allergy & Precautions, H&P , NPO status , Patient's Chart, lab work & pertinent test results  Airway Mallampati: II  TM Distance: >3 FB Neck ROM: Full    Dental  (+) Dental Advisory Given, Missing, Poor Dentition, Chipped   Pulmonary asthma , sleep apnea , former smoker   Pulmonary exam normal breath sounds clear to auscultation       Cardiovascular hypertension, Pt. on medications + dysrhythmias Atrial Fibrillation + Valvular Problems/Murmurs  Rhythm:Regular Rate:Normal + Systolic murmurs    Neuro/Psych  Headaches PSYCHIATRIC DISORDERS Anxiety Depression     Neuromuscular disease    GI/Hepatic Neg liver ROS, hiatal hernia,GERD  Medicated and Controlled,,  Endo/Other  diabetes, Well Controlled, Type 2, Oral Hypoglycemic Agents    Renal/GU negative Renal ROS  negative genitourinary   Musculoskeletal  (+) Arthritis , Osteoarthritis,    Abdominal   Peds negative pediatric ROS (+)  Hematology negative hematology ROS (+)   Anesthesia Other Findings   Reproductive/Obstetrics negative OB ROS                             Anesthesia Physical Anesthesia Plan  ASA: 3  Anesthesia Plan: General and Spinal   Post-op Pain Management: Dilaudid IV and Regional block*   Induction: Intravenous  PONV Risk Score and Plan: 4 or greater and Ondansetron, Midazolam, Scopolamine patch - Pre-op and Metaclopromide  Airway Management Planned: Nasal Cannula, Natural Airway and Simple Face Mask  Additional Equipment:   Intra-op Plan:   Post-operative Plan:   Informed Consent:      Dental advisory given  Plan Discussed with: CRNA and Surgeon  Anesthesia Plan Comments: (Patient has diabetic peripheral neuropathy, small risk of neuropathy getting worse was explained. Agreed to get regional block for postop  pain.)        Anesthesia Quick Evaluation

## 2022-12-06 NOTE — Anesthesia Postprocedure Evaluation (Signed)
Anesthesia Post Note  Patient: Monique Warren  Procedure(s) Performed: TOTAL KNEE ARTHROPLASTY (Right: Knee)  Patient location during evaluation: Phase II Anesthesia Type: General Level of consciousness: awake and alert and oriented Pain management: pain level controlled Vital Signs Assessment: post-procedure vital signs reviewed and stable Respiratory status: spontaneous breathing, nonlabored ventilation and respiratory function stable Cardiovascular status: blood pressure returned to baseline and stable Postop Assessment: no apparent nausea or vomiting Anesthetic complications: no  No notable events documented.   Last Vitals:  Vitals:   12/06/22 1530 12/06/22 1603  BP: (!) 141/76 110/69  Pulse: 76 68  Resp: 11 20  Temp: 36.7 C 36.8 C  SpO2: 94% 92%    Last Pain:  Vitals:   12/06/22 1603  TempSrc: Oral  PainSc:                  Simon Aaberg C Yousof Alderman

## 2022-12-06 NOTE — Op Note (Signed)
12/06/2022  1:12 PM  PATIENT:  Monique Warren  72 y.o. female  PRE-OPERATIVE DIAGNOSIS:  Right knee osteoarthritis  POST-OPERATIVE DIAGNOSIS:  Right knee osteoarthritis  PROCEDURE:  Procedure(s): TOTAL KNEE ARTHROPLASTY (Right)  SURGEON:  Surgeon(s) and Role:    Carole Civil, MD - Primary  PHYSICIAN ASSISTANT:   ASSISTANTS: cynthia wrenn / kaitlin raines    ANESTHESIA:   general and adductor canal block   EBL:  5 mL   BLOOD ADMINISTERED:none  DRAINS: none   LOCAL MEDICATIONS USED:  OTHER zinrelef  SPECIMEN:  No Specimen  DISPOSITION OF SPECIMEN:  N/A  COUNTS:  YES  TOURNIQUET:   Total Tourniquet Time Documented: Thigh (Right) - 91 minutes Total: Thigh (Right) - 91 minutes   DICTATION: .Viviann Spare Dictation  PLAN OF CARE: Admit for overnight observation  PATIENT DISPOSITION:  PACU - hemodynamically stable.   Delay start of Pharmacological VTE agent (>24hrs) due to surgical blood loss or risk of bleeding: not applicable  Orthopaedic Surgery Operative Note (CSN: [id number])  Monique Warren  08/19/1951 Date of Surgery: 12/06/2022  Implants: Implant Name Type Inv. Item Serial No. Manufacturer Lot No. LRB No. Used Action  CEMENT HV SMART SET - [id number] Cement CEMENT HV SMART SET  [employer] [id number] Right 2 Implanted  ATTUNE MED DOME PAT 38 KNEE - [id number] Knees ATTUNE MED DOME PAT 38 KNEE  [employer] [id number] Right 1 Implanted  BASEPLATE TIB CMT FB PCKT SZ4 - [id number] Stem BASEPLATE TIB CMT FB PCKT SZ4  [employer] [id number] Right 1 Implanted  ATTUNE PSFEM RTSZ6 NARCEM KNEE - [id number] Femur ATTUNE PSFEM RTSZ6 NARCEM KNEE  [employer] [id number] Right 1 Implanted  INSERT TIB ATTUNE FB SZ6X6 - [id number] Insert INSERT TIB ATTUNE FB SZ6X6  [employer] [id number] Right 1 Implanted   Dictation for total knee replacement  Bone cuts:  Femur 9 + 2   Tibia 3 mm from medial side    12/06/2022  1:21  PM  Orthopaedic Surgery Operative Note (CSN: [id number])  Monique Warren  October 11, 1950 Date of Surgery: 12/06/2022    Implants: Implant Name Type Inv. Item Serial No. Manufacturer Lot No. LRB No. Used Action  CEMENT HV SMART SET - [id number] Cement CEMENT HV SMART SET  [employer] [id number] Right 2 Implanted  ATTUNE MED DOME PAT 38 KNEE - [id number] Knees ATTUNE MED DOME PAT 38 KNEE  [employer] [id number] Right 1 Implanted  BASEPLATE TIB CMT FB PCKT SZ4 - [id number] Stem BASEPLATE TIB CMT FB PCKT SZ4  [employer] [id number] Right 1 Implanted  ATTUNE PSFEM RTSZ6 NARCEM KNEE - [id number] Femur ATTUNE PSFEM RTSZ6 NARCEM KNEE  [employer] [id number] Right 1 Implanted  INSERT TIB ATTUNE FB SZ6X6 - [id number] Insert INSERT TIB ATTUNE FB SZ6X6  [employer] [id number] Right 1 Implanted     Indications for Surgery:   Monique Warren is a 72 y.o. female who presented with disabling right knee pain from osteoarthritis.  She could not get improvement with nonoperative measures..  Benefits and risks of operative and nonoperative management were discussed prior to surgery with patient/guardian(s) and informed consent form was completed.  While all risks cannot be anticipated, specific risks including infection, need for additional surgery, stiffness, postop pain, infection, implant removal, loosening, infection requiring amputation, deep vein thrombosis, pulmonary embolus were discussed.   Procedure:   The patient was identified properly. Informed consent was obtained and the surgical site was marked. The patient was taken to the OR where  general anesthesia was induced.  The patient was positioned supine on the operating table.  The right lower extremity was prepped and draped in the usual sterile fashion.  Timeout was performed before the beginning of the case.  Tourniquet was used for the above duration.   Details of surgery: The patient was identified by 2 approved  identification mechanisms. The operative extremity was evaluated and found to be acceptable for surgical treatment today. The chart was reviewed. The surgical site was confirmed and marked. The patient had adductor canal block  The patient was taken to the operating room and given appropriate antibiotic vancomycin due to penicillin allergy. This is consistent with the SCIP protocol.  The patient was given the following anesthetic: General anesthetic as stated  The patient was then placed supine on the operating table. A Foley catheter was inserted. The operative extremity was prepped and draped sterilely from the toes to the groin.  Timeout was executed confirming the patient's name, surgical site, antibiotic administration, x-rays available, and implants available.  The operative limb,  was exsanguinated with a six-inch Esmarch and the tourniquet was inflated to 300 mmHg.  A straight midline incision was made over the right KNEE and taken down to the extensor mechanism. A medial arthrotomy was performed. The patella was everted and the patellofemoral soft tissue was released, along with the patellar fat pad.  The anterior cruciate ligament and PCL were resected.  The anterior horns of the lateral and medial meniscus were resected. The medial soft tissue sleeve was elevated to the mid coronal plane.  A three-eighths inch drill bit was used to enter the femoral canal which was decompressed with suction and irrigation until clear.   The distal femoral cutting guide was set for 9 mm distal resection,  5valgus alignment, for a right knee. The distal femur was resected and checked for flatness.  The attune sizing femoral guide was placed and the femur was preliminarily sized to a size 6.   The external alignment guide for the tibial resection was then applied to the distal and proximal tibia and set for anatomic slope along with 3 MM resection  from the deficient medial side.   Rotational  alignment was set using the malleolus, the tibial tubercle and the tibial spines.  The proximal tibia was resected along with  residual menisci. The tibia was sized using a base plate to a size 4.   The extension gap was checked.  The gap was tight so an additional 2 mm were taken from the distal femur.  A 6 spacer block fit   A 4-in-1 cutting block was placed along with collateral ligament retractors.  Our target was proper rotation based on the epicondylar axis using Whitesides line as a guide as well.  Once the block which was a 6 was pinned in place we took the spacer block that balanced extension gap and placed it on top of the tibia under the femoral block.  This fit with a good snug fit.   Once I was satisfied with the spacer block collateral ligament retractors were placed and I completed the 4 distal femoral cuts   The extension gap was rechecked with the same spacer block which was a size 6   The correct sized notch cutting guide for the femur was then applied and the notch cut was made.  Trial reduction was completed using size 6 femur, 4 tibia with a 5 trial implants.  I tried a 6 it felt too tight.  Patella tracking was normal  We then skeletonized the patella. It measured 23 in thickness and the patellar resection was set for 12 millimeters. the patellar resection was completed. The patella diameter measured 12. We then drilled the peg holes for the patella.    The proximal tibia was prepared using the size 38 base plate.  Thorough irrigation was performed using saline  and the bone was dried and prepared for cement. The cement was mixed on the back table using third generation preparation techniques  The implants were then cemented in place and excess cement was removed. The cement was allowed to cure. Surgipor irrigation was placed followed by saline irrigation  Any excess bone fragments and cement was removed.  Repeat trial range of motion I felt the knee was loose in  flexion and extension so I took out the 5 implant poly and put in the 6.  The first 6 poly implant did not seat and had to be removed and a second 6 poly implant was placed  This gave better stability in flexion extension and maintained range of motion of 120 degrees with passive flexion of 115 degrees  Surgery 4 was used as final irrigation  The extensor mechanism was closed with #1 Bralon suture followed by subcutaneous tissue closure using 0 Monocryl suture in 2 layers  Zynrelef a total of 2 vials were injected prior to complete extensor mechanism closure.  The openings and the capsule were then closed with #1 Braylon   Skin approximation was performed using staples  A sterile dressing was applied, TED hose were placed on the operative extremity followed by Cryo/Cuff.  The patient was taken recovery room in stable condition  Postop plan: Weightbearing as tolerated CPM machine Immediate physical therapy Discharge tomorrow if stable

## 2022-12-06 NOTE — Interval H&P Note (Signed)
History and Physical Interval Note:  12/06/2022 10:35 AM  Monique Warren  has presented today for surgery, with the diagnosis of Right knee osteoarthritis.  The various methods of treatment have been discussed with the patient and family. After consideration of risks, benefits and other options for treatment, the patient has consented to  Procedure(s): TOTAL KNEE ARTHROPLASTY (Right) as a surgical intervention.  The patient's history has been reviewed, patient examined, no change in status, stable for surgery.  I have reviewed the patient's chart and labs.  Questions were answered to the patient's satisfaction.     Arther Abbott

## 2022-12-06 NOTE — Transfer of Care (Signed)
Immediate Anesthesia Transfer of Care Note  Patient: Monique Warren  Procedure(s) Performed: TOTAL KNEE ARTHROPLASTY (Right: Knee)  Patient Location: PACU  Anesthesia Type:General  Level of Consciousness: awake and patient cooperative  Airway & Oxygen Therapy: Patient Spontanous Breathing and Patient connected to nasal cannula oxygen  Post-op Assessment: Report given to RN, Post -op Vital signs reviewed and stable, and Patient moving all extremities  Post vital signs: Reviewed and stable  Last Vitals:  Vitals Value Taken Time  BP 153/79 12/06/22 1312  Temp 97.4 12/06/22 1312  Pulse 80 12/06/22 1315  Resp 17 12/06/22 1315  SpO2 93 % 12/06/22 1315  Vitals shown include unvalidated device data.  Last Pain:  Vitals:   12/06/22 0921  TempSrc: Oral  PainSc: 7       Patients Stated Pain Goal: 0 ([id number])  Complications: No notable events documented.

## 2022-12-06 NOTE — Anesthesia Procedure Notes (Signed)
Procedure Name: Intubation Date/Time: 12/06/2022 11:05 AM  Performed by: Maude Leriche, CRNAPre-anesthesia Checklist: Patient identified, Suction available, Emergency Drugs available, Patient being monitored and Timeout performed Patient Re-evaluated:Patient Re-evaluated prior to induction Oxygen Delivery Method: Circle system utilized Preoxygenation: Pre-oxygenation with 100% oxygen Induction Type: IV induction Ventilation: Mask ventilation without difficulty Laryngoscope Size: Miller and 2 Grade View: Grade I Tube type: Oral Tube size: 7.0 mm Number of attempts: 1 Airway Equipment and Method: Patient positioned with wedge pillow and Stylet Placement Confirmation: ETT inserted through vocal cords under direct vision, positive ETCO2, CO2 detector and breath sounds checked- equal and bilateral Secured at: 21 cm Tube secured with: Tape Dental Injury: Teeth and Oropharynx as per pre-operative assessment

## 2022-12-07 LAB — BASIC METABOLIC PANEL
Anion gap: 8 (ref 5–15)
BUN: 13 mg/dL (ref 8–23)
CO2: 21 mmol/L — ABNORMAL LOW (ref 22–32)
Calcium: 7.8 mg/dL — ABNORMAL LOW (ref 8.9–10.3)
Chloride: 107 mmol/L (ref 98–111)
Creatinine, Ser: 0.82 mg/dL (ref 0.44–1.00)
GFR, Estimated: >60 mL/min (ref >60)
Glucose, Bld: 181 mg/dL — ABNORMAL HIGH (ref 70–99)
Potassium: 4.3 mmol/L (ref 3.5–5.1)
Sodium: 136 mmol/L (ref 135–145)

## 2022-12-07 LAB — CBC
HCT: 41.8 % (ref 36.0–46.0)
Hemoglobin: 14.2 g/dL (ref 12.0–15.0)
MCH: 32.9 pg (ref 26.0–34.0)
MCHC: 34 g/dL (ref 30.0–36.0)
MCV: 97 fL (ref 80.0–100.0)
Platelets: 301 10*3/uL (ref 150–400)
RBC: 4.31 MIL/uL (ref 3.87–5.11)
RDW: 12.6 % (ref 11.5–15.5)
WBC: 16 10*3/uL — ABNORMAL HIGH (ref 4.0–10.5)
nRBC: 0 % (ref 0.0–0.2)

## 2022-12-07 MED ORDER — ACETAMINOPHEN 500 MG PO TABS: 500.0000 mg | ORAL_TABLET | Freq: Four times a day (QID) | ORAL | Status: DC | PRN

## 2022-12-07 MED ORDER — METHOCARBAMOL 1000 MG/10ML IJ SOLN
INTRAMUSCULAR | Status: AC
  Filled 2022-12-07: qty 10

## 2022-12-07 MED ORDER — NALOXONE HCL 0.4 MG/ML IJ SOLN
0.4000 mg | Freq: Once | INTRAMUSCULAR | Status: AC
  Administered 2022-12-07: 0.4 mg via INTRAVENOUS
  Filled 2022-12-07: qty 1

## 2022-12-07 NOTE — Progress Notes (Signed)
Ultrasound used to obtain IV access by Jeff,RN.

## 2022-12-07 NOTE — Progress Notes (Signed)
1630 unwitnessed fall. patient found in floor sitting on her bottom. Reports pain rating at a 4 on scale 1-10. Orientation/mentation still questionable. See flowsheet for vitals. Fall protocol followed. MD informed. Charge informed. Patient states she was trying to get up to use rest room and that she felt the urge to go. Bed alarm sounding call bell was initiated.  Assisted patient to bedside commode but she was unable to urinate. Bladder scanner showed >800 in bladder. In and out catheter done drained 825 cc. No new orders placed at this present time.

## 2022-12-07 NOTE — Progress Notes (Signed)
Patient ID: Monique Warren, female   DOB: [birth year], 72 y.o.   MRN: :[id number]  POD1 RT TKA  Ms. Lintz is very confused.  Her knee looks fine her calf is supple  She is otherwise neurovascularly intact  She had 700 cc of urine in the straight cath  BP 135/68 (BP Location: Left Arm)   Pulse 97   Temp 98.2 F (36.8 C) (Oral)   Resp 18   Ht 5\' 2"  (1.575 m)   Wt 90 kg   SpO2 94%   BMI 36.29 kg/m   #1 confusion change all pain medications use Tylenol and tramadol  #2 urinary retention order in and out caths  #3 postop total knee advance PT as tolerated recognizing the confusion  Assessment and plan  Confusion postop day 1 status post right total knee with urinary retention  Plan as above

## 2022-12-07 NOTE — Progress Notes (Signed)
Pt husband Lynann Bologna informed of patient fall.

## 2022-12-07 NOTE — Progress Notes (Signed)
Patient distended and tender on palpation has not urinated yet since foley removal this AM 0600. Bladder scanned 759 present in bladder. MD informed. No new orders placed at this present time.

## 2022-12-07 NOTE — Progress Notes (Signed)
Patient states "I hear voices" states her dog is hiding under her blanket beside of her bed. Bed alarm on bed in lowest position nonskid socks on fall mats have already been implemented.

## 2022-12-07 NOTE — Evaluation (Addendum)
Physical Therapy Evaluation Patient Details Name: Monique Warren MRN: [id number] DOB: March 13, 1951 Today's Date: 12/07/2022  History of Present Illness  Monique Warren is a 72 y.o. female with past medical history of hypertension, cerebral artery aneurysm s/p clip, migraine, asthma, OSA, celiac disease, GERD, type II DM, osteoporosis, depression with anxiety and obesity who Recieved a Rt TKR on 12/06/22  Clinical Impression  PT thinks that she is at her home despite telling pt multiple times that she is in the hospital.  Pt is reluctant to use Rt UE for bed mobility or bearing wt on walker stating the it has OA and is to painful.  Pt needed max assist to come sit to stand and could not advance Rt LE.  Pt will continue to need skilled PT to improve mobility.         Recommendations for follow up therapy are one component of a multi-disciplinary discharge planning process, led by the attending physician.  Recommendations may be updated based on patient status, additional functional criteria and insurance authorization.  Follow Up Recommendations Follow physician's recommendations for discharge plan and follow up therapies      Assistance Recommended at Discharge Frequent or constant Supervision/Assistance  Patient can return home with the following  Two people to help with walking and/or transfers    Equipment Recommendations Rolling walker (2 wheels);BSC/3in1  Recommendations for Other Services       Functional Status Assessment Patient has had a recent decline in their functional status and demonstrates the ability to make significant improvements in function in a reasonable and predictable amount of time.     Precautions / Restrictions Precautions Precautions: Fall Restrictions Weight Bearing Restrictions: Yes RLE Weight Bearing: Weight bearing as tolerated      Mobility  Bed Mobility Overal bed mobility: Needs Assistance Bed Mobility: Supine to Sit, Sit to Supine      Supine to sit: Max assist Sit to supine: Min assist        Transfers Overall transfer level: Needs assistance Equipment used: Rolling walker (2 wheels) Transfers: Sit to/from Stand Sit to Stand: Max assist                Ambulation/Gait   Gait Distance (Feet): 0 Feet (therapist could not get pt to advance her right leg. Pt also states that she can not weight bear on Rt UE due to OA and will not) Assistive device: Rolling walker (2 wheels)                   Pertinent Vitals/Pain Pain Assessment Pain Assessment: 0-10 Pain Score: 5  Pain Location: rt knee Pain Descriptors / Indicators: Aching, Sharp    Home Living Family/patient expects to be discharged to:: Private residence Living Arrangements: Spouse/significant other Available Help at Discharge: Family Type of Home: House Home Access: Stairs to enter Entrance Stairs-Rails: Right Entrance Stairs-Number of Steps: 3   Home Layout: One level Home Equipment: Conservation officer, nature (2 wheels)      Prior Function Prior Level of Function : Independent/Modified Independent                        Extremity/Trunk Assessment        Lower Extremity Assessment Lower Extremity Assessment: RLE deficits/detail RLE Deficits / Details: generally 3- for knee, 2+ hip       Communication   Communication: No difficulties  Cognition Arousal/Alertness: Lethargic Behavior During Therapy: WFL for tasks assessed/performed Overall Cognitive Status: Impaired/Different  from baseline Area of Impairment: Orientation, Following commands                 Orientation Level: Place     Following Commands: Follows one step commands consistently                General Comments      Exercises General Exercises - Lower Extremity Ankle Circles/Pumps: AROM, Both, 10 reps Quad Sets: Right, 10 reps Gluteal Sets: Both, 10 reps Long Arc Quad: AROM, Right, 5 reps Heel Slides: AAROM, Right, 10 reps, Supine Rt  LE ROM 8-65   Assessment/Plan    PT Assessment Patient needs continued PT services  PT Problem List Decreased strength;Decreased range of motion;Decreased activity tolerance;Decreased balance;Decreased mobility;Obesity;Pain       PT Treatment Interventions Gait training;DME instruction;Functional mobility training;Therapeutic exercise;Patient/family education    PT Goals (Current goals can be found in the Care Plan section)  Acute Rehab PT Goals Patient Stated Goal: to go home PT Goal Formulation: With patient Time For Goal Achievement: 12/10/22 Potential to Achieve Goals: Good    Frequency Min 5X/week     Co-evaluation               AM-PAC PT "6 Clicks" Mobility  Outcome Measure Help needed turning from your back to your side while in a flat bed without using bedrails?: A Little Help needed moving from lying on your back to sitting on the side of a flat bed without using bedrails?: A Lot Help needed moving to and from a bed to a chair (including a wheelchair)?: Total Help needed standing up from a chair using your arms (e.g., wheelchair or bedside chair)?: A Lot Help needed to walk in hospital room?: Total Help needed climbing 3-5 steps with a railing? : Total 6 Click Score: 10    End of Session Equipment Utilized During Treatment: Gait belt;Oxygen Activity Tolerance: Patient limited by pain Patient left: in bed;with nursing/sitter in room Nurse Communication: Mobility status PT Visit Diagnosis: Unsteadiness on feet (R26.81);Muscle weakness (generalized) (M62.81);Difficulty in walking, not elsewhere classified (R26.2)    Time: [id number] PT Time Calculation (min) (ACUTE ONLY): 68 min   Charges:   PT Evaluation $PT Eval Moderate Complexity: 1 Mod PT Treatments $Therapeutic Exercise: 8-22 mins        Rayetta Humphrey, PT CLT [phone]  12/07/2022, 11:23 AM

## 2022-12-07 NOTE — Plan of Care (Signed)
  Problem: Acute Rehab PT Goals(only PT should resolve) Goal: Pt Will Go Supine/Side To Sit Flowsheets (Taken 12/07/2022 1120) Pt will go Supine/Side to Sit: with modified independence Goal: Pt Will Go Sit To Supine/Side Flowsheets (Taken 12/07/2022 1120) Pt will go Sit to Supine/Side: with modified independence Goal: Pt Will Transfer Bed To Chair/Chair To Bed Flowsheets (Taken 12/07/2022 1120) Pt will Transfer Bed to Chair/Chair to Bed: with modified independence Goal: Pt Will Ambulate Flowsheets (Taken 12/07/2022 1120) Pt will Ambulate:  75 feet  with minimal assist Goal: Pt Will Go Up/Down Stairs Flowsheets (Taken 12/07/2022 1120) Pt will Go Up / Down Stairs:  3-5 stairs  with moderate assist  with rail(s)

## 2022-12-07 NOTE — Progress Notes (Signed)
In and out cath done drained 700 ml. Pt reported feeling some relief. Denies tenderness on palpation in abdomen now. Still presents with confusion even after narcan administration.

## 2022-12-07 NOTE — Progress Notes (Signed)
Assisted patient with ambulating to the restroom with rolling walker. Patient voided a medium amount. Patient stated she felt like her bladder was empty at this time. Assisted back to bed. Bed alarm on, call bell within reach. Plan of care ongoing.

## 2022-12-08 DIAGNOSIS — Z87891 Personal history of nicotine dependence: Secondary | ICD-10-CM | POA: Diagnosis not present

## 2022-12-08 DIAGNOSIS — E1140 Type 2 diabetes mellitus with diabetic neuropathy, unspecified: Secondary | ICD-10-CM | POA: Diagnosis present

## 2022-12-08 DIAGNOSIS — Z880 Allergy status to penicillin: Secondary | ICD-10-CM | POA: Diagnosis not present

## 2022-12-08 DIAGNOSIS — Z96652 Presence of left artificial knee joint: Secondary | ICD-10-CM | POA: Diagnosis present

## 2022-12-08 DIAGNOSIS — R339 Retention of urine, unspecified: Secondary | ICD-10-CM | POA: Diagnosis not present

## 2022-12-08 DIAGNOSIS — Z90710 Acquired absence of both cervix and uterus: Secondary | ICD-10-CM | POA: Diagnosis not present

## 2022-12-08 DIAGNOSIS — E785 Hyperlipidemia, unspecified: Secondary | ICD-10-CM | POA: Diagnosis present

## 2022-12-08 DIAGNOSIS — K449 Diaphragmatic hernia without obstruction or gangrene: Secondary | ICD-10-CM | POA: Diagnosis present

## 2022-12-08 DIAGNOSIS — R410 Disorientation, unspecified: Secondary | ICD-10-CM | POA: Diagnosis present

## 2022-12-08 DIAGNOSIS — I10 Essential (primary) hypertension: Secondary | ICD-10-CM | POA: Diagnosis present

## 2022-12-08 DIAGNOSIS — K219 Gastro-esophageal reflux disease without esophagitis: Secondary | ICD-10-CM | POA: Diagnosis present

## 2022-12-08 DIAGNOSIS — G43909 Migraine, unspecified, not intractable, without status migrainosus: Secondary | ICD-10-CM | POA: Diagnosis present

## 2022-12-08 DIAGNOSIS — Z96651 Presence of right artificial knee joint: Secondary | ICD-10-CM | POA: Diagnosis present

## 2022-12-08 DIAGNOSIS — J45909 Unspecified asthma, uncomplicated: Secondary | ICD-10-CM | POA: Diagnosis present

## 2022-12-08 DIAGNOSIS — Z6836 Body mass index (BMI) 36.0-36.9, adult: Secondary | ICD-10-CM | POA: Diagnosis not present

## 2022-12-08 DIAGNOSIS — M1711 Unilateral primary osteoarthritis, right knee: Secondary | ICD-10-CM | POA: Diagnosis present

## 2022-12-08 DIAGNOSIS — K900 Celiac disease: Secondary | ICD-10-CM | POA: Diagnosis present

## 2022-12-08 DIAGNOSIS — K2270 Barrett's esophagus without dysplasia: Secondary | ICD-10-CM | POA: Diagnosis present

## 2022-12-08 DIAGNOSIS — G4733 Obstructive sleep apnea (adult) (pediatric): Secondary | ICD-10-CM | POA: Diagnosis present

## 2022-12-08 DIAGNOSIS — T402X5A Adverse effect of other opioids, initial encounter: Secondary | ICD-10-CM | POA: Diagnosis present

## 2022-12-08 DIAGNOSIS — M810 Age-related osteoporosis without current pathological fracture: Secondary | ICD-10-CM | POA: Diagnosis present

## 2022-12-08 LAB — CBC
HCT: 38.8 % (ref 36.0–46.0)
Hemoglobin: 12.8 g/dL (ref 12.0–15.0)
MCH: 32.4 pg (ref 26.0–34.0)
MCHC: 33 g/dL (ref 30.0–36.0)
MCV: 98.2 fL (ref 80.0–100.0)
Platelets: 299 10*3/uL (ref 150–400)
RBC: 3.95 MIL/uL (ref 3.87–5.11)
RDW: 12.6 % (ref 11.5–15.5)
WBC: 15.2 10*3/uL — ABNORMAL HIGH (ref 4.0–10.5)
nRBC: 0 % (ref 0.0–0.2)

## 2022-12-08 MED ORDER — METHOCARBAMOL 1000 MG/10ML IJ SOLN
INTRAMUSCULAR | Status: AC
  Filled 2022-12-08: qty 10

## 2022-12-08 NOTE — Progress Notes (Signed)
Patient ID: Monique Warren, female   DOB: 09/27/1950, 72 y.o.   MRN: [id number]  Postop day 2 status post right total knee  Monique Warren had issues with her mentation yesterday was given Narcan and taken off most of her opioids except for tramadol and Tylenol  She is much more lucid today  However, she does not have a discharge plan in terms of her PT/home health.  Apparently she lives in [place] and the home health agencies and therapist do not go to that [place].  This will need to be settled prior to discharge  She also has not seen therapy to clear the steps because she was having such an issue with mobilization.  That has improved according to nursing staff.  Today's exam shows that she is much more lucid as I stated so she should be able to tolerate physical therapy today but her discharge plan needs to be settled prior to discharging home

## 2022-12-08 NOTE — Progress Notes (Addendum)
Ambulated patient to the restroom with rolling walker. Patient tolerated well. Patient did void as well. Patient states, "I feel like I emptied my bladder". No distention or discomfort. Plan of care ongoing.

## 2022-12-08 NOTE — Progress Notes (Signed)
Pt A&Ox4 back to her baseline

## 2022-12-08 NOTE — Progress Notes (Signed)
Physical Therapy Treatment Patient Details Name: Monique Warren MRN: [id number] DOB: Jun 02, 1951 Today's Date: 12/08/2022   History of Present Illness Monique Warren is a 72 y.o. female with past medical history of hypertension, cerebral artery aneurysm s/p clip, migraine, asthma, OSA, celiac disease, GERD, type II DM, osteoporosis, depression with anxiety and obesity who Recieved a Rt TKR on 12/06/22    PT Comments    PT much improved today as far as orientation.  Able to ambulate with RW with SBA and complete 3 steps with SBA.     Recommendations for follow up therapy are one component of a multi-disciplinary discharge planning process, led by the attending physician.  Recommendations may be updated based on patient status, additional functional criteria and insurance authorization.  Follow Up Recommendations  Home health PT     Assistance Recommended at Discharge Intermittent Supervision/Assistance  Patient can return home with the following A little help with walking and/or transfers;A little help with bathing/dressing/bathroom;Assistance with cooking/housework;Help with stairs or ramp for entrance   Equipment Recommendations  Rolling walker (2 wheels);BSC/3in1    Recommendations for Other Services       Precautions / Restrictions Precautions Precautions: None Restrictions Weight Bearing Restrictions: Yes RLE Weight Bearing: Weight bearing as tolerated     Mobility  Bed Mobility Overal bed mobility: Needs Assistance Bed Mobility: Supine to Sit     Supine to sit: Min assist          Transfers Overall transfer level: Needs assistance Equipment used: Rolling walker (2 wheels) Transfers: Sit to/from Stand Sit to Stand: Min assist                Ambulation/Gait Ambulation/Gait assistance: Supervision Gait Distance (Feet): 90 Feet Assistive device: Rolling walker (2 wheels) Gait Pattern/deviations: WFL(Within Functional Limits)   Gait velocity  interpretation: 1.31 - 2.62 ft/sec, indicative of limited community ambulator       Stairs Stairs: Yes Stairs assistance: Supervision Stair Management: One rail Right, Sideways Number of Stairs: 3       Balance                                          Cognition Arousal/Alertness: Awake/alert Behavior During Therapy: WFL for tasks assessed/performed Overall Cognitive Status: Within Functional Limits for tasks assessed Area of Impairment: Orientation, Following commands                                        Exercises Total Joint Exercises Ankle Circles/Pumps: AROM, Both, 10 reps Quad Sets: AROM, Right, 10 reps Gluteal Sets: Both, 5 reps Heel Slides: AROM, Right, 10 reps Hip ABduction/ADduction: AROM, Right, 10 reps Goniometric ROM: 5-70    General Comments        Pertinent Vitals/Pain Pain Assessment Pain Score: 4  Pain Location: rt knee Pain Descriptors / Indicators: Sore Pain Intervention(s): Limited activity within patient's tolerance    Home Living                          Prior Function            PT Goals (current goals can now be found in the care plan section) Acute Rehab PT Goals Patient Stated Goal: to go home PT Goal Formulation: With patient  Time For Goal Achievement: 12/10/22 Progress towards PT goals: Progressing toward goals    Frequency    Min 5X/week      PT Plan Current plan remains appropriate    Co-evaluation              AM-PAC PT "6 Clicks" Mobility   Outcome Measure  Help needed turning from your back to your side while in a flat bed without using bedrails?: A Little Help needed moving from lying on your back to sitting on the side of a flat bed without using bedrails?: A Little Help needed moving to and from a bed to a chair (including a wheelchair)?: A Little Help needed standing up from a chair using your arms (e.g., wheelchair or bedside chair)?: A Little Help  needed to walk in hospital room?: A Little Help needed climbing 3-5 steps with a railing? : A Little 6 Click Score: 18    End of Session Equipment Utilized During Treatment: Gait belt Activity Tolerance: Patient tolerated treatment well Patient left: in chair;with call bell/phone within reach Nurse Communication: Mobility status PT Visit Diagnosis: Unsteadiness on feet (R26.81);Muscle weakness (generalized) (M62.81);Difficulty in walking, not elsewhere classified (R26.2)     Time: 1030-1100 PT Time Calculation (min) (ACUTE ONLY): 30 min  Charges:  $Gait Training: 8-22 mins $Therapeutic Exercise: 8-22 mins                      Rayetta Humphrey, PT CLT [phone]  12/08/2022, 12:38 PM

## 2022-12-08 NOTE — Progress Notes (Addendum)
Bladder scan completed by nurse tech. 297 ml in at this time.

## 2022-12-09 ENCOUNTER — Other Ambulatory Visit: Payer: Self-pay

## 2022-12-09 MED ORDER — DILTIAZEM HCL ER COATED BEADS 240 MG PO CP24
240.0000 mg | ORAL_CAPSULE | Freq: Every day | ORAL | Status: DC
  Administered 2022-12-09 – 2022-12-10 (×2): 240 mg via ORAL
  Filled 2022-12-09 (×2): qty 1

## 2022-12-09 NOTE — Progress Notes (Signed)
Physical Therapy Treatment Patient Details Name: Monique Warren MRN: [id number] DOB: 1951-02-27 Today's Date: 12/09/2022   RIGHT KNEE ROM:  0 - 90 degrees AMBULATION DISTANCE: 100 feet using RW with Supervision/Modified Independent   History of Present Illness Monique Warren is a 72 y.o. female with past medical history of hypertension, cerebral artery aneurysm s/p clip, migraine, asthma, OSA, celiac disease, GERD, type II DM, osteoporosis, depression with anxiety and obesity who Recieved a Rt TKR on 12/06/22    PT Comments    Patient presents alert and oriented x 3 and agreeable for therapy.  Patient demonstrates labored movement for sitting up at bedside with difficulty propping on elbows to hands with HOB flat, good return for completing sit to stands, transferring to chair and ambulating in hallway without loss of balance.  Patient demonstrates fair/good return for going up/down steps in stairwell using both hands on 1 side rail without loss of balance and tolerated sitting up in chair with RLE dangling after therapy - nursing staff aware.  Patient will benefit from continued skilled physical therapy in hospital and recommended venue below to increase strength, balance, endurance for safe ADLs and gait.     Recommendations for follow up therapy are one component of a multi-disciplinary discharge planning process, led by the attending physician.  Recommendations may be updated based on patient status, additional functional criteria and insurance authorization.  Follow Up Recommendations       Assistance Recommended at Discharge Set up Supervision/Assistance  Patient can return home with the following A little help with walking and/or transfers;A little help with bathing/dressing/bathroom;Assistance with cooking/housework;Help with stairs or ramp for entrance   Equipment Recommendations  None recommended by PT    Recommendations for Other Services       Precautions /  Restrictions Precautions Precautions: Fall Restrictions Weight Bearing Restrictions: Yes RLE Weight Bearing: Weight bearing as tolerated     Mobility  Bed Mobility Overal bed mobility: Needs Assistance Bed Mobility: Supine to Sit     Supine to sit: Min guard, Min assist     General bed mobility comments: slow labored movement with difficulty propping up on elbows to hands    Transfers Overall transfer level: Needs assistance Equipment used: Rolling walker (2 wheels) Transfers: Sit to/from Stand, Bed to chair/wheelchair/BSC Sit to Stand: Supervision   Step pivot transfers: Supervision       General transfer comment: slightly labored movement    Ambulation/Gait Ambulation/Gait assistance: Supervision, Modified independent (Device/Increase time) Gait Distance (Feet): 100 Feet Assistive device: Rolling walker (2 wheels) Gait Pattern/deviations: Decreased step length - right, Decreased step length - left, Decreased stride length, Antalgic Gait velocity: decreased     General Gait Details: slow slightly labored cadence with fair/good return for right heel to toe stepping without loss of balance   Stairs Stairs: Yes Stairs assistance: Supervision Stair Management: One rail Left, Sideways, Step to pattern Number of Stairs: 3 General stair comments: demonstrates fair/good return for going up/down steps using both hands on 1 side rail without loss of balance and understanding acknowledged   Wheelchair Mobility    Modified Rankin (Stroke Patients Only)       Balance Overall balance assessment: Needs assistance Sitting-balance support: Feet supported, No upper extremity supported Sitting balance-Leahy Scale: Good Sitting balance - Comments: seated at EOB   Standing balance support: During functional activity, Bilateral upper extremity supported Standing balance-Leahy Scale: Fair Standing balance comment: fair/good using RW  Cognition Arousal/Alertness: Awake/alert Behavior During Therapy: WFL for tasks assessed/performed Overall Cognitive Status: Within Functional Limits for tasks assessed                                 General Comments: Patient Alert and Oriented x 3        Exercises Total Joint Exercises Ankle Circles/Pumps: Supine, 10 reps, Right, Strengthening, AROM Quad Sets: AROM, Strengthening, Right, 10 reps, Supine Short Arc Quad: AROM, Strengthening, Right, 10 reps, Supine Heel Slides: AROM, Strengthening, Right, 10 reps, Supine Goniometric ROM: Right knee: 0 - 90 degrees    General Comments        Pertinent Vitals/Pain Pain Assessment Pain Assessment: 0-10 Pain Score: 9  Pain Location: Right Knee Pain Descriptors / Indicators: Sore Pain Intervention(s): Limited activity within patient's tolerance, Monitored during session, Repositioned    Home Living                          Prior Function            PT Goals (current goals can now be found in the care plan section) Acute Rehab PT Goals Patient Stated Goal: to go home PT Goal Formulation: With patient Time For Goal Achievement: 12/10/22 Potential to Achieve Goals: Good Progress towards PT goals: Progressing toward goals    Frequency    BID      PT Plan Current plan remains appropriate    Co-evaluation              AM-PAC PT "6 Clicks" Mobility   Outcome Measure  Help needed turning from your back to your side while in a flat bed without using bedrails?: A Little Help needed moving from lying on your back to sitting on the side of a flat bed without using bedrails?: A Little Help needed moving to and from a bed to a chair (including a wheelchair)?: A Little Help needed standing up from a chair using your arms (e.g., wheelchair or bedside chair)?: A Little Help needed to walk in hospital room?: A Little Help needed climbing 3-5 steps with a railing? : A Little 6 Click Score:  18    End of Session Equipment Utilized During Treatment: Gait belt Activity Tolerance: Patient tolerated treatment well;Other (comment) Patient left: in chair;with call bell/phone within reach;with chair alarm set Nurse Communication: Mobility status PT Visit Diagnosis: Unsteadiness on feet (R26.81);Muscle weakness (generalized) (M62.81);Difficulty in walking, not elsewhere classified (R26.2)     Time: [id number] PT Time Calculation (min) (ACUTE ONLY): 30 min  Charges:  $Gait Training: 8-22 mins $Therapeutic Exercise: 8-22 mins                     9:12 AM, 12/09/22 Lonell Grandchild, MPT Physical Therapist with [phone] office [phone] mobile phone

## 2022-12-09 NOTE — Progress Notes (Addendum)
I was administering patient medications and when scanning the Diltiazem 120 MG 12 hr capsule (this medication is a patient specific medication) an error message popped up indicating wrong medication / medication not ordered. I reviewed medication and attempted to scan again and got same error message. Reviewed with [place], RN and called pharmacy and spoke with Kenney Houseman the pharmacist at this time I noticed that in small print it stated that it was a once a day and not a BID dose and was informed to hold medication by [place] pharmacist. Notified charge nurse Lawernce Keas and pharmacy notified [facility]. Pulled medication from patient specific bin and labeled it and placed in bin for pharmacy to pick up. Notified patient of findings. Patient has no complaints and VS are WNL. HR during this shift is 86-88. Notified Dr. Arther Abbott via [facility] page. No new orders at this time.

## 2022-12-09 NOTE — Progress Notes (Addendum)
Out of bed to chair today. Assisted patient to bathroom using front wheel  walker. Assisted back to bed. Call bell in reach. Plan of care on going.

## 2022-12-09 NOTE — Progress Notes (Signed)
Mobility Specialist Progress Note:    12/09/22 1312  Mobility  Activity Transferred to/from [facility];Ambulated with assistance to bathroom  Level of Assistance Contact guard assist, steadying assist  Assistive Device Front wheel walker  Distance Ambulated (ft) 14 ft  RLE Weight Bearing WBAT  Activity Response Tolerated well  Mobility Referral Yes  $Mobility charge 1 Mobility   Pt requested assistance from BR > B. CGA throughout for safety with RW. Tolerated well, c/o RLE pain. Returned pt to bed, all needs met, call bell in reach.   Royetta Crochet Mobility Specialist Please contact via Solicitor or  Rehab office at [phone]

## 2022-12-09 NOTE — TOC Progression Note (Signed)
Transition of Care [facility]) - Progression Note    Patient Details  Name: Monique Warren MRN: [id number] Date of Birth: [birth year]  Transition of [facility]) CM/SW Contact  Boneta Lucks, RN Phone Number: 12/09/2022, 3:28 PM  Clinical Narrative:   [facility] unable to take patient for homehealth, Patient lives in [place]. PT has no follow up recommendation. Patient walked 100 feet.     Expected Discharge Plan: Home/Self Care Barriers to Discharge: No [facility] will accept this patient  Expected Discharge Plan and Services       Social Determinants of Health (SDOH) Interventions SDOH Screenings   Food Insecurity: No Food Insecurity (12/06/2022)  Transportation Needs: No Transportation Needs (12/06/2022)  Utilities: Not At Risk (12/06/2022)  Depression (PHQ2-9): High Risk (10/02/2022)  Tobacco Use: Medium Risk (12/06/2022)    Readmission Risk Interventions     No data to display

## 2022-12-10 ENCOUNTER — Other Ambulatory Visit: Payer: Self-pay | Admitting: Internal Medicine

## 2022-12-10 ENCOUNTER — Other Ambulatory Visit: Payer: Self-pay

## 2022-12-10 LAB — GROUP A STREP BY PCR: Group A Strep by PCR: NOT DETECTED

## 2022-12-10 MED ORDER — POLYETHYLENE GLYCOL 3350 17 G PO PACK: 17.0000 g | PACK | Freq: Every day | ORAL | 0 refills | Status: DC | PRN

## 2022-12-10 MED ORDER — NYSTATIN 100000 UNIT/ML MT SUSP
5.0000 mL | Freq: Four times a day (QID) | OROMUCOSAL | Status: DC
  Administered 2022-12-10 (×2): 500000 [IU] via ORAL
  Filled 2022-12-10 (×3): qty 5

## 2022-12-10 MED ORDER — ASPIRIN 325 MG PO TBEC: 325.0000 mg | DELAYED_RELEASE_TABLET | Freq: Every day | ORAL | 0 refills | Status: DC

## 2022-12-10 MED ORDER — DOCUSATE SODIUM 100 MG PO CAPS: 100.0000 mg | ORAL_CAPSULE | Freq: Two times a day (BID) | ORAL | 0 refills | Status: DC

## 2022-12-10 MED ORDER — MENTHOL 3 MG MT LOZG: 1.0000 | LOZENGE | OROMUCOSAL | Status: DC | PRN

## 2022-12-10 MED ORDER — ACETAMINOPHEN 500 MG PO TABS: 500.0000 mg | ORAL_TABLET | Freq: Four times a day (QID) | ORAL | 0 refills | Status: AC | PRN

## 2022-12-10 MED ORDER — METHOCARBAMOL 500 MG PO TABS: 500.0000 mg | ORAL_TABLET | Freq: Four times a day (QID) | ORAL | 2 refills | Status: DC | PRN

## 2022-12-10 MED ORDER — TRAMADOL HCL 50 MG PO TABS: 50.0000 mg | ORAL_TABLET | Freq: Four times a day (QID) | ORAL | 0 refills | Status: DC

## 2022-12-10 NOTE — Discharge Summary (Signed)
Physician Discharge Summary  Patient ID: Monique Warren MRN: [id number] DOB/AGE: May 12, 1951 72 y.o.  Admit date: 12/06/2022 Discharge date: 12/10/2022  Admission Diagnoses: OA RIGHT KNEE  Discharge Diagnoses: OA RIGHT KNEE, POST CONFUSION, 2ND TO OPIODS   Discharged Condition: good  Procedure: RT TKA ATTUNE   Hospital Course:  SURGERY ON HD 1   CONFUSION NOTED   NARCAN AND STOPPING AL OPIOIDS EXCEPT TRAMADOL  IMPROVED SIGNIFICANTLY BY HD 2   HD 2-DC  PARTICIPATED IN PT AND ADVANCED AS TOLERATED    Implant Name Type Inv. Item Serial No. Manufacturer Lot No. LRB No. Used Action  CEMENT HV SMART SET - [id number] Cement CEMENT HV SMART SET  [employer] [id number] Right 2 Implanted  ATTUNE MED DOME PAT 38 KNEE - [id number] Knees ATTUNE MED DOME PAT 38 KNEE  [employer] [id number] Right 1 Implanted  BASEPLATE TIB CMT FB PCKT SZ4 - [id number] Stem BASEPLATE TIB CMT FB PCKT SZ4  [employer] [id number] Right 1 Implanted  ATTUNE PSFEM RTSZ6 NARCEM KNEE - [id number] Femur ATTUNE PSFEM RTSZ6 NARCEM KNEE  [employer] [id number] Right 1 Implanted  INSERT TIB ATTUNE FB SZ6X6 - [id number] Insert INSERT TIB ATTUNE FB SZ6X6  [employer] [id number] Right 1 Implanted         Discharge Exam: BP (!) 155/66 (BP Location: Left Arm)   Pulse 90   Temp 98.2 F (36.8 C) (Oral)   Resp 18   Ht 5\' 2"  (1.575 m)   Wt 90 kg   SpO2 98%   BMI 36.29 kg/m  Physical Exam Vitals and nursing note reviewed.  Constitutional:      Appearance: Normal appearance.  HENT:     Head: Normocephalic and atraumatic.  Eyes:     General: No scleral icterus.       Right eye: No discharge.        Left eye: No discharge.     Extraocular Movements: Extraocular movements intact.     Conjunctiva/sclera: Conjunctivae normal.     Pupils: Pupils are equal, round, and reactive to light.  Cardiovascular:     Rate and Rhythm: Normal rate.     Pulses: Normal pulses.  Musculoskeletal:      Right knee: Swelling and effusion present. No deformity, erythema or ecchymosis. Tenderness present.     Comments: NO SIGNS OF INFECTION OR DVT  Skin:    General: Skin is warm and dry.     Capillary Refill: Capillary refill takes less than 2 seconds.  Neurological:     General: No focal deficit present.     Mental Status: She is alert and oriented to person, place, and time.  Psychiatric:        Mood and Affect: Mood normal.        Behavior: Behavior normal.        Thought Content: Thought content normal.        Judgment: Judgment normal.        Disposition: Discharge disposition: 01-Home or Self Care       Discharge Instructions     Call MD / Call 911   Complete by: As directed    If you experience chest pain or shortness of breath, CALL 911 and be transported to the hospital emergency room.  If you develope a fever above 101 F, pus (white drainage) or increased drainage or redness at the wound, or calf pain, call your surgeon's office.   Constipation Prevention   Complete by: As directed  Drink plenty of fluids.  Prune juice may be helpful.  You may use a stool softener, such as Colace (over the counter) 100 mg twice a day.  Use MiraLax (over the counter) for constipation as needed.   Diet - low sodium heart healthy   Complete by: As directed    Increase activity slowly as tolerated   Complete by: As directed    Post-operative opioid taper instructions:   Complete by: As directed    POST-OPERATIVE OPIOID TAPER INSTRUCTIONS: It is important to wean off of your opioid medication as soon as possible. If you do not need pain medication after your surgery it is ok to stop day one. Opioids include: Codeine, Hydrocodone(Norco, Vicodin), Oxycodone(Percocet, oxycontin) and hydromorphone amongst others.  Long term and even short term use of opiods can cause: Increased pain response Dependence Constipation Depression Respiratory depression And more.  Withdrawal symptoms  can include Flu like symptoms Nausea, vomiting And more Techniques to manage these symptoms Hydrate well Eat regular healthy meals Stay active Use relaxation techniques(deep breathing, meditating, yoga) Do Not substitute Alcohol to help with tapering If you have been on opioids for less than two weeks and do not have pain than it is ok to stop all together.  Plan to wean off of opioids This plan should start within one week post op of your joint replacement. Maintain the same interval or time between taking each dose and first decrease the dose.  Cut the total daily intake of opioids by one tablet each day Next start to increase the time between doses. The last dose that should be eliminated is the evening dose.         Allergies as of 12/10/2022       Reactions   Cefuroxime Anaphylaxis, Hives, Shortness Of Breath, Swelling, Anxiety   Codeine Hives, Shortness Of Breath, Nausea And Vomiting, Swelling, Anxiety   Sulfa Antibiotics Anaphylaxis, Hives, Nausea And Vomiting, Swelling   Swollen throat   Sulfamethoxazole-trimethoprim Shortness Of Breath, Swelling, Anxiety   Throat swelling   Gluten Meal    Severe reflux - celiac disease        Medication List     TAKE these medications    acetaminophen 500 MG tablet Commonly known as: TYLENOL Take 1 tablet (500 mg total) by mouth every 6 (six) hours as needed for moderate pain.   Aimovig 140 MG/ML Soaj Generic drug: Erenumab-aooe INJECT 140 MG UNDER THE SKIN EVERY 30 DAYS   AirDuo Digihaler 232-14 MCG/ACT Aepb Generic drug: Fluticasone-Salmeterol(sensor) USE 1 INHALATION TWICE A DAY What changed: See the new instructions.   albuterol 108 (90 Base) MCG/ACT inhaler Commonly known as: VENTOLIN HFA USE 2 INHALATIONS EVERY 6 HOURS AS NEEDED FOR WHEEZING OR SHORTNESS OF BREATH   aspirin EC 325 MG tablet Take 1 tablet (325 mg total) by mouth daily with breakfast.   atorvastatin 40 MG tablet Commonly known as:  LIPITOR Take 1 tablet (40 mg total) by mouth at bedtime.   diltiazem 120 MG 12 hr capsule Commonly known as: CARDIZEM SR TAKE 2 CAPSULES DAILY   docusate sodium 100 MG capsule Commonly known as: COLACE Take 1 capsule (100 mg total) by mouth 2 (two) times daily.   DULoxetine 60 MG capsule Commonly known as: CYMBALTA TAKE 1 CAPSULE DAILY What changed: when to take this   Estradiol 10 MCG Tabs vaginal tablet Place 10 mcg vaginally every Monday.   famotidine 40 MG tablet Commonly known as: PEPCID TAKE 1 TABLET DAILY What changed:  when to take this   gabapentin 300 MG capsule Commonly known as: NEURONTIN Take 1 capsule (300 mg total) by mouth at bedtime. What changed:  when to take this reasons to take this   Linzess 72 MCG capsule Generic drug: linaclotide TAKE 1 CAPSULE DAILY BEFORE BREAKFAST   methocarbamol 500 MG tablet Commonly known as: ROBAXIN Take 1 tablet (500 mg total) by mouth every 6 (six) hours as needed for muscle spasms.   metoprolol succinate 50 MG 24 hr tablet Commonly known as: TOPROL-XL Take 0.5 tablets (25 mg total) by mouth daily for 14 days, THEN 1 tablet (50 mg total) daily. Take with or immediately following a meal.. Start taking on: Feb 12, 2022 What changed: See the new instructions.   multivitamin with minerals Tabs tablet Take 1 tablet by mouth daily.   OneTouch Delica Plus 0000000 Misc Use to check glucose once daily   OneTouch Ultra test strip Generic drug: glucose blood Use to monitor glucose once daily   polyethylene glycol 17 g packet Commonly known as: MIRALAX / GLYCOLAX Take 17 g by mouth daily as needed for mild constipation.   Prolia 60 MG/ML Sosy injection Generic drug: denosumab INJECT 60 MG UNDER THE SKIN EVERY 6 MONTHS What changed: See the new instructions.   RABEprazole 20 MG tablet Commonly known as: ACIPHEX TAKE 1 TABLET DAILY BEFORE BREAKFAST   Semaglutide (2 MG/DOSE) 8 MG/3ML Sopn Inject 2 mg as directed  once a week.   traMADol 50 MG tablet Commonly known as: ULTRAM Take 1 tablet (50 mg total) by mouth every 6 (six) hours.   venlafaxine 37.5 MG tablet Commonly known as: EFFEXOR Take 1 tablet (37.5 mg total) by mouth 2 (two) times daily.   Vitamin D3 125 MCG (5000 UT) capsule Generic drug: Cholecalciferol Take 5,000 Units by mouth daily.   zolmitriptan 5 MG tablet Commonly known as: ZOMIG Take 5 mg by mouth daily as needed for migraine.         Signed: Arther Abbott 12/10/2022, 10:44 AM

## 2022-12-10 NOTE — TOC Transition Note (Signed)
Transition of Care [facility]) - CM/SW Discharge Note   Patient Details  Name: Monique Warren MRN: [id number] Date of Birth: 12/21/1950  Transition of Care [facility]) CM/SW Contact:  Boneta Lucks, RN Phone Number: 12/10/2022, 10:46 AM   Clinical Narrative:   Patient has medicare A and B, She did not want to use, TOC explained, she can use her medicare to get home health. It will not be covered under her [employer]. She is agreeable. [facility] has staffing for [employer]. Caryl Pina accepted the referral. MD placed orders and will discharge patient home. RN updated.    Final next level of care: Home/Self Care Barriers to Discharge: Barriers Resolved   Patient Goals and CMS Choice CMS Medicare.gov Compare Post Acute Care list provided to:: Patient Choice offered to / list presented to : Patient  Discharge Placement                 Patient to be transferred to facility by: Nephew   Patient and family notified of of transfer: 12/10/22  Discharge Plan and Services Additional resources added to the After Visit Summary for                   Social Determinants of Health (SDOH) Interventions SDOH Screenings   Food Insecurity: No Food Insecurity (12/06/2022)  Housing: [place]  (12/10/2022)  Transportation Needs: No Transportation Needs (12/06/2022)  Utilities: Not At Risk (12/06/2022)  Depression (PHQ2-9): High Risk (10/02/2022)  Tobacco Use: Medium Risk (12/06/2022)    Readmission Risk Interventions    12/10/2022   10:45 AM  Readmission Risk Prevention Plan  Post Dischage Appt Not Complete  Medication Screening Complete  Transportation Screening Complete

## 2022-12-10 NOTE — Progress Notes (Addendum)
Mobility Specialist Progress Note:    12/10/22 0937  Mobility  Activity Ambulated with assistance in hallway  Level of Assistance Contact guard assist, steadying assist  Assistive Device Front wheel walker  Distance Ambulated (ft) 300 ft  RLE Weight Bearing WBAT  Activity Response Tolerated well  Mobility Referral Yes  $Mobility charge 1 Mobility   Pt agreeable to mobility session. Tolerated well, c/o RLE pain (4/10 pain). CGA required for safety with RW. Returned pt to room, sitting up in chair, call bell in reach, floor mat placed, all needs met.   Royetta Crochet Mobility Specialist Please contact via Solicitor or  Rehab office at [phone]

## 2022-12-10 NOTE — Progress Notes (Signed)
Discharge instructions given on medications and follow up visits, patient verbalized understanding. Prescriptions sent to Pharmacy of choice documented on AVS. [facility] care to follow up with patient at home.Accompanied by staff to an awaiting vehicle.

## 2022-12-11 ENCOUNTER — Telehealth: Payer: Self-pay | Admitting: Radiology

## 2022-12-11 ENCOUNTER — Telehealth: Payer: Self-pay

## 2022-12-11 NOTE — Telephone Encounter (Signed)
Call patient/ no shower. Per Dr Aline Brochure

## 2022-12-11 NOTE — Transitions of Care (Post Inpatient/ED Visit) (Signed)
   12/11/2022  Name: Monique Warren MRN: [id number] DOB: [birth year]  Today's TOC FU Call Status: Today's TOC FU Call Status:: Successful TOC FU Call Competed TOC FU Call Complete Date: 12/11/22  Transition Care Management Follow-up Telephone Call Date of Discharge: 12/10/22 Discharge [facility] Type of Discharge: Inpatient Admission Primary Inpatient Discharge Diagnosis:: osteoarthritis How have you been since you were released from the hospital?: Better Any questions or concerns?: No  Items Reviewed: Did you receive and understand the discharge instructions provided?: Yes Medications obtained and verified?: Yes (Medications Reviewed) Any new allergies since your discharge?: No Dietary orders reviewed?: Yes Do you have support at home?: Yes People in Home: spouse  Home Care and Equipment/Supplies: [place] Ordered?: Yes Name of [place]:: [place] set up a time to come to your home?: Yes [place] Visit Date: 12/11/22 Any new equipment or medical supplies ordered?: Yes Name of Medical supply agency?: unknown Were you able to get the equipment/medical supplies?: Yes Do you have any questions related to the use of the equipment/supplies?: No  Functional Questionnaire: Do you need assistance with bathing/showering or dressing?: Yes Do you need assistance with meal preparation?: No Do you need assistance with eating?: No Do you have difficulty maintaining continence: No Do you need assistance with getting out of bed/getting out of a chair/moving?: Yes Do you have difficulty managing or taking your medications?: No  Follow up appointments reviewed: PCP Follow-up appointment confirmed?: [facility] Follow-up appointment confirmed?: Yes Date of Specialist follow-up appointment?: 12/20/22 Follow-Up Specialty Provider:: Dr Aline Brochure Do you need transportation to your follow-up appointment?: No Do you  understand care options if your condition(s) worsen?: Yes-patient verbalized understanding    [place], [place] Nurse Health Advisor Direct Dial [phone]

## 2022-12-11 NOTE — Telephone Encounter (Signed)
I called patient to advise. Left message for her to call back. Need to advise her mistake in her discharge summary, she should not shower until after staples removed.

## 2022-12-12 LAB — BPAM RBC
Blood Product Expiration Date: 202404252359
Blood Product Expiration Date: 202404252359
Unit Type and Rh: 5100
Unit Type and Rh: 5100

## 2022-12-12 LAB — TYPE AND SCREEN
ABO/RH(D): O POS
Antibody Screen: NEGATIVE
Unit division: 0
Unit division: 0

## 2022-12-12 NOTE — Telephone Encounter (Signed)
I called again to advise. Told her to leave bandage on not to shower, she did get my message about no shower, and voiced understanding. However, she took the bandage off and it is uncovered, she said her dog is trying to get to it, I told her to send someone to pick up bandages here if she doesn't have anything to put on it, she states her husband will come by this afternoon. I left bandages at front for him to pick up  To you FYI

## 2022-12-18 ENCOUNTER — Encounter (HOSPITAL_COMMUNITY): Payer: Self-pay | Admitting: Orthopedic Surgery

## 2022-12-20 ENCOUNTER — Encounter: Payer: Self-pay | Admitting: Orthopedic Surgery

## 2022-12-20 ENCOUNTER — Ambulatory Visit: Payer: Medicare Other | Admitting: Orthopedic Surgery

## 2022-12-20 ENCOUNTER — Ambulatory Visit (INDEPENDENT_AMBULATORY_CARE_PROVIDER_SITE_OTHER): Payer: 59 | Admitting: Orthopedic Surgery

## 2022-12-20 DIAGNOSIS — Z96651 Presence of right artificial knee joint: Secondary | ICD-10-CM

## 2022-12-20 DIAGNOSIS — R42 Dizziness and giddiness: Secondary | ICD-10-CM

## 2022-12-20 MED ORDER — MECLIZINE HCL 12.5 MG PO TABS: 12.5000 mg | ORAL_TABLET | Freq: Three times a day (TID) | ORAL | 0 refills | Status: DC | PRN

## 2022-12-20 MED ORDER — PROMETHAZINE HCL 12.5 MG PO TABS: 12.5000 mg | ORAL_TABLET | Freq: Four times a day (QID) | ORAL | 0 refills | Status: AC | PRN

## 2022-12-20 NOTE — Progress Notes (Signed)
Chief Complaint  Patient presents with   Post-op Follow-up    Right knee replacement 12/06/22 has cancelled last 3 home therapy visits due to vertigo states has not scheduled physical therapy visits yet.    Postop visit #1 postop day #14  Complains of nausea and dizziness  Wound looks good staples taken out.  Knee flexing up to 90 degrees  Recommend she continue aspirin for DVT prevention tramadol for pain  Add medication for vertigo push fluids.  Patient thinks she is having vertigo thinking maybe it is more fluid depletion  Meds ordered this encounter  Medications   promethazine (PHENERGAN) 12.5 MG tablet    Sig: Take 1 tablet (12.5 mg total) by mouth every 6 (six) hours as needed for nausea or vomiting.    Dispense:  30 tablet    Refill:  0   meclizine (ANTIVERT) 12.5 MG tablet    Sig: Take 1 tablet (12.5 mg total) by mouth 3 (three) times daily as needed for dizziness.    Dispense:  30 tablet    Refill:  0    Start outpatient PT follow-up in 3 weeks

## 2022-12-20 NOTE — Patient Instructions (Addendum)
Call therapy today now to schedule the physical therapy Physical therapy has been ordered for you at [facility]. [phone] is the phone number to call, you need appointments soon, today    Medications for the surgery   Aspirin   Tramadol pain medication   Robaxin muscle relaxer   For the "vertigo" Meclizine Fluids 1 liter per day  Phenergan

## 2022-12-23 ENCOUNTER — Ambulatory Visit (HOSPITAL_COMMUNITY): Payer: 59

## 2022-12-25 ENCOUNTER — Encounter (HOSPITAL_COMMUNITY): Payer: 59

## 2022-12-25 ENCOUNTER — Telehealth (HOSPITAL_COMMUNITY): Payer: Self-pay

## 2022-12-25 NOTE — Telephone Encounter (Signed)
I called patient as well to discuss therapy. She states she is unable to do therapy, I explained to her she will have a bad outcome if she does not do physical therapy after the knee replacement and may require additional surgery or have a failed knee replacement.   Told her to call her primary care about the vertigo, now. Told her to reschedule the therapy now.   She voiced understanding

## 2022-12-25 NOTE — Telephone Encounter (Signed)
Left message on patient's voicemail regarding her status after TKA and requested a call back.  We have appointments scheduled out for her but she did not come for her initial evaluation so wanted to get an update and she if she is going to be able to attend outpatient therapy. It looks like from chart review she is having some issue with vertigo.  9:56 AM, 12/25/22 Afton Mikelson Small Emmory Solivan MPT [place] [facility] [place] [phone]

## 2022-12-27 ENCOUNTER — Encounter (HOSPITAL_COMMUNITY): Payer: 59

## 2022-12-30 ENCOUNTER — Ambulatory Visit (HOSPITAL_COMMUNITY): Payer: 59

## 2022-12-30 ENCOUNTER — Telehealth: Payer: Self-pay | Admitting: Orthopedic Surgery

## 2022-12-30 NOTE — Telephone Encounter (Signed)
Dr. Mort Sawyers pt - spoke w/Stacey in Outpatient Rehabilitation [phone] and she stated this patient cancelled her evaluation appointment again, this is the third time the patient has cancelled.  The are going to try again Friday.

## 2023-01-01 ENCOUNTER — Other Ambulatory Visit: Payer: Self-pay | Admitting: Internal Medicine

## 2023-01-01 ENCOUNTER — Ambulatory Visit: Payer: 59 | Admitting: Internal Medicine

## 2023-01-01 VITALS — BP 87/65 | HR 112 | Ht 62.0 in | Wt 181.6 lb

## 2023-01-01 DIAGNOSIS — E1140 Type 2 diabetes mellitus with diabetic neuropathy, unspecified: Secondary | ICD-10-CM

## 2023-01-01 DIAGNOSIS — F331 Major depressive disorder, recurrent, moderate: Secondary | ICD-10-CM

## 2023-01-01 DIAGNOSIS — F418 Other specified anxiety disorders: Secondary | ICD-10-CM

## 2023-01-01 DIAGNOSIS — Z96651 Presence of right artificial knee joint: Secondary | ICD-10-CM

## 2023-01-01 DIAGNOSIS — I10 Essential (primary) hypertension: Secondary | ICD-10-CM

## 2023-01-01 DIAGNOSIS — H8110 Benign paroxysmal vertigo, unspecified ear: Secondary | ICD-10-CM | POA: Diagnosis not present

## 2023-01-01 DIAGNOSIS — M810 Age-related osteoporosis without current pathological fracture: Secondary | ICD-10-CM

## 2023-01-01 NOTE — Patient Instructions (Addendum)
Please do not take Lisinopril. Please take at least 64 ounces of fluid in a day. Okay to take fresh fruit juice or gatorade.  Please take Meclizine 25 mg as needed for dizziness.  Please avoid sudden positional changes.

## 2023-01-01 NOTE — Assessment & Plan Note (Addendum)
BP Readings from Last 1 Encounters:  01/01/23 (!) 87/65   Usually well-controlled with lisinopril, metoprolol and carvedilol Hypotension today likely due to poor p.o. intake and vomiting, advised to hold lisinopril for now until her vomiting resolves Counseled for compliance with the medications Advised DASH diet and moderate exercise/walking, at least 150 mins/week

## 2023-01-01 NOTE — Progress Notes (Addendum)
Established Patient Office Visit  Subjective:  Patient ID: Monique Warren, female    DOB: [birth year]  Age: 72 y.o. MRN: [id number]  CC:  Chief Complaint  Patient presents with   Fatigue    Patient had surgery on 12/11/2022 since surgery, she has been over sleeping , vomiting and constant sweats. She feels she is in the beginning on a vertigo flare up. She also states two staples were not removed and would like them to be removed today.    HPI PENELOPY Warren is a 72 y.o. female with past medical history of hypertension, cerebral artery aneurysm s/p clip, migraine, asthma, OSA, celiac disease, GERD, type II DM, osteoporosis, depression with anxiety and obesity who presents for f/u of her chronic medical conditions.  She is having dizziness with position change.  She also reports nausea.  She was given meclizine for dizziness and Zofran for nausea in the postop visit by Dr. Romeo Apple.  She has had very poor p.o. intake and has been sleeping throughout the day due to dizziness.  HTN: BP is low today. Takes medications regularly. Patient denies headache, chest pain, dyspnea or palpitations.  She agrees to hold lisinopril until her nausea/vomiting improves.   Type II DM: Her HbA1C was 6.1 in 02/24. Her Ozempic dose was increased by endocrinology to 2 mg qw.  Her blood glucose remains around 120-150 most of the time.  She denies any polyuria or polydipsia.  MDD: She had been feeling apathy, decreased concentration, fatigue and insomnia despite taking with Wellbutrin 300 mg and Cymbalta 60 mg daily.  She was placed on Effexor instead of Wellbutrin.  She has been tolerating it better.  She reports improvement in her apathy and concentration with it. Denies any SI or HI currently.  Migraine: Takes Aimovig and feels better now. Takes zolmitriptan as needed for breakthrough headache.   OSA: She had sleep study done in 05/20 and was given CPAP device, but has had difficulty with its mask.  She has  chronic fatigue and daytime hypersomnolence currently.  Chronic fatigue: She has chronic fatigue, worse for the past few months.  Denies any fever, chills, chronic cough, recent weight loss, night sweats or LAD.  She has OSA, likely untreated.  She is also placed on beta-blocker for tachycardia in addition to Cardizem.  She has had episodes of dizziness.  She has not had cardiology visit since adding Cardizem.    Past Medical History:  Diagnosis Date   Anxiety    Arthritis    Asthma    Celiac disease    Depression    Diabetes mellitus, type II    Diabetic neuropathy    Diastolic dysfunction    Grade 1 with preserved EF   Dysrhythmia    GERD (gastroesophageal reflux disease)    Heart murmur    Hiatal hernia    Sliding   Hyperlipidemia    Hypertension    Migraine    Obstructive sleep apnea    Osteoporosis    Vertigo     Past Surgical History:  Procedure Laterality Date   ABDOMINAL HYSTERECTOMY  09/29/2003   APPENDECTOMY     BALLOON DILATION N/A 02/08/2022   Procedure: BALLOON DILATION;  Surgeon: Lanelle Bal, DO;  [facility]: [facility];  Service: Endoscopy;  Laterality: N/A;   BIOPSY  02/08/2022   Procedure: BIOPSY;  Surgeon: Lanelle Bal, DO;  Location: [facility];  Service: Endoscopy;;   BREAST BIOPSY Left    CARPAL TUNNEL  RELEASE     CATARACT EXTRACTION Bilateral    October and November 2016   COLONOSCOPY  09/2017   Blake Divine, MD in [place]; 5 mm tubular adenoma in the descending colon s/p resected, mild sigmoid diverticulosis, internal hemorrhoids.  Recommended repeat colonoscopy in 5 years.   CRANIOTOMY FOR ANEURYSM / VERTEBROBASILAR / CAROTID CIRCULATION Right 10/24/2015   ESOPHAGOGASTRODUODENOSCOPY  09/2017   [name]; irregular Z-line s/p biopsy, decreased LES tone, 4 cm hiatal hernia, erythema in gastric antrum biopsied, normal examined duodenum biopsied.  Esophageal biopsy suggestive of reflux, no Barrett's, gastric biopsy with nonspecific  chronic gastritis with intestinal metaplasia, duodenal biopsy with increased intraepithelial lymphocytes without blunting of villi, nonspecific.   ESOPHAGOGASTRODUODENOSCOPY (EGD) WITH PROPOFOL N/A 02/08/2022   Surgeon: Lanelle Bal, DO; 2 cm hiatal hernia, short segment Barrett's esophagus without dysplasia, mild Schatzki's ring dilated, gastritis with biopsies benign, normal examined duodenum.  Repeat EGD in 5 years.   KNEE ARTHROSCOPY WITH MEDIAL MENISECTOMY Left 04/09/2022   Procedure: KNEE ARTHROSCOPY WITH PARTIAL MEDIAL MENISCECTOMY, LATERAL MENISCAL DEBRIDEMENT;  Surgeon: Vickki Hearing, MD;  Location: [facility];  Service: Orthopedics;  Laterality: Left;   KNEE ARTHROSCOPY WITH MENISCAL REPAIR Left 04/09/2022   Procedure: KNEE ARTHROSCOPY WITH MEDIAL MENISCAL REPAIR;  Surgeon: Vickki Hearing, MD;  Location: [facility];  Service: Orthopedics;  Laterality: Left;   LAPAROSCOPY     TOTAL KNEE ARTHROPLASTY Right 12/06/2022   Procedure: TOTAL KNEE ARTHROPLASTY;  Surgeon: Vickki Hearing, MD;  Location: [facility];  Service: Orthopedics;  Laterality: Right;   UTERINE FIBROID SURGERY     WRIST SURGERY Bilateral     Family History  Problem Relation Age of Onset   Breast cancer Mother    Thyroid disease Mother    Stroke Mother    Hypertension Father    Hyperlipidemia Father    Heart attack Father    Heart failure Father    Breast cancer Paternal Grandmother    Cancer - Colon Neg Hx    Gastric cancer Neg Hx    Esophageal cancer Neg Hx    Liver cancer Neg Hx    Autoimmune disease Neg Hx     Social History   Socioeconomic History   Marital status: Married    Spouse name: Not on file   Number of children: Not on file   Years of education: Not on file   Highest education level: GED or equivalent  Occupational History   Not on file  Tobacco Use   Smoking status: Former    Packs/day: .5    Types: Cigarettes   Smokeless tobacco: Never  Vaping Use   Vaping Use: Never used   Substance and Sexual Activity   Alcohol use: Yes    Alcohol/week: 4.0 standard drinks of alcohol    Types: 2 Glasses of wine, 2 Shots of liquor per week    Comment: rarely   Drug use: Never   Sexual activity: Not on file  Other Topics Concern   Not on file  Social History Narrative   Not on file   Social Determinants of Health   Financial Resource Strain: Low Risk  (01/01/2023)   Overall Financial Resource Strain (CARDIA)    Difficulty of Paying Living Expenses: Not hard at all  Food Insecurity: No Food Insecurity (01/01/2023)   Hunger Vital Sign    Worried About Running Out of Food in the Last Year: Never true    Ran Out of Food in the Last Year: Never  true  Transportation Needs: No Transportation Needs (01/01/2023)   PRAPARE - Administrator, Civil Service (Medical): No    Lack of Transportation (Non-Medical): No  Physical Activity: Unknown (01/01/2023)   Exercise Vital Sign    Days of Exercise per Week: 0 days    Minutes of Exercise per Session: Not on file  Stress: Stress Concern Present (01/01/2023)   [employer] of Occupational Health - Occupational Stress Questionnaire    Feeling of Stress : To some extent  Social Connections: Moderately Integrated (01/01/2023)   Social Connection and Isolation Panel [NHANES]    Frequency of Communication with Friends and Family: More than three times a week    Frequency of Social Gatherings with Friends and Family: More than three times a week    Attends Religious Services: 1 to 4 times per year    Active Member of [employer] or Organizations: No    Attends Banker Meetings: Not on file    Marital Status: Married  Catering manager Violence: Not At Risk (12/06/2022)   Humiliation, Afraid, Rape, and Kick questionnaire    Fear of Current or Ex-Partner: No    Emotionally Abused: No    Physically Abused: No    Sexually Abused: No    Outpatient Medications Prior to Visit  Medication Sig Dispense Refill    acetaminophen (TYLENOL) 500 MG tablet Take 1 tablet (500 mg total) by mouth every 6 (six) hours as needed for moderate pain. 30 tablet 0   AIMOVIG 140 MG/ML SOAJ INJECT 140 MG UNDER THE SKIN EVERY 30 DAYS 1 mL 11   AIRDUO DIGIHALER 232-14 MCG/ACT AEPB USE 1 INHALATION TWICE A DAY (Patient taking differently: Inhale 2 puffs into the lungs 2 (two) times daily.) 1 each 11   albuterol (VENTOLIN HFA) 108 (90 Base) MCG/ACT inhaler USE 2 INHALATIONS EVERY 6 HOURS AS NEEDED FOR WHEEZING OR SHORTNESS OF BREATH 8.5 g 10   aspirin EC 325 MG tablet Take 1 tablet (325 mg total) by mouth daily with breakfast. 35 tablet 0   atorvastatin (LIPITOR) 40 MG tablet Take 1 tablet (40 mg total) by mouth at bedtime. 90 tablet 3   Cholecalciferol (VITAMIN D3) 125 MCG (5000 UT) capsule Take 5,000 Units by mouth daily.     diltiazem (CARDIZEM SR) 120 MG 12 hr capsule TAKE 2 CAPSULES DAILY 180 capsule 1   docusate sodium (COLACE) 100 MG capsule Take 1 capsule (100 mg total) by mouth 2 (two) times daily. 10 capsule 0   DULoxetine (CYMBALTA) 60 MG capsule TAKE 1 CAPSULE DAILY (Patient taking differently: Take 60 mg by mouth at bedtime.) 90 capsule 3   Estradiol 10 MCG TABS vaginal tablet Place 10 mcg vaginally every Monday.     famotidine (PEPCID) 40 MG tablet TAKE 1 TABLET DAILY (Patient taking differently: Take 40 mg by mouth at bedtime.) 90 tablet 3   gabapentin (NEURONTIN) 300 MG capsule Take 1 capsule (300 mg total) by mouth at bedtime. (Patient taking differently: Take 300 mg by mouth at bedtime as needed (pain).) 90 capsule 3   Lancets (ONETOUCH DELICA PLUS LANCET33G) MISC Use to check glucose once daily 100 each 6   LINZESS 72 MCG capsule TAKE 1 CAPSULE DAILY BEFORE BREAKFAST 30 capsule 11   meclizine (ANTIVERT) 12.5 MG tablet Take 1 tablet (12.5 mg total) by mouth 3 (three) times daily as needed for dizziness. 30 tablet 0   methocarbamol (ROBAXIN) 500 MG tablet Take 1 tablet (500 mg total) by mouth  every 6 (six) hours as  needed for muscle spasms. 60 tablet 2   metoprolol succinate (TOPROL-XL) 50 MG 24 hr tablet Take 0.5 tablets (25 mg total) by mouth daily for 14 days, THEN 1 tablet (50 mg total) daily. Take with or immediately following a meal.. (Patient taking differently: 1 tablet (25 mg total) daily. Take with or immediately following a meal..) 90 tablet 3   Multiple Vitamin (MULTIVITAMIN WITH MINERALS) TABS tablet Take 1 tablet by mouth daily.     ONETOUCH ULTRA test strip Use to monitor glucose once daily 100 each 6   polyethylene glycol (MIRALAX / GLYCOLAX) 17 g packet Take 17 g by mouth daily as needed for mild constipation. 14 each 0   PROLIA 60 MG/ML SOSY injection INJECT 60 MG UNDER THE SKIN EVERY 6 MONTHS 1 mL 1   promethazine (PHENERGAN) 12.5 MG tablet Take 1 tablet (12.5 mg total) by mouth every 6 (six) hours as needed for nausea or vomiting. 30 tablet 0   RABEprazole (ACIPHEX) 20 MG tablet TAKE 1 TABLET DAILY BEFORE BREAKFAST 30 tablet 11   Semaglutide, 2 MG/DOSE, 8 MG/3ML SOPN Inject 2 mg as directed once a week. 6 mL 1   traMADol (ULTRAM) 50 MG tablet Take 1 tablet (50 mg total) by mouth every 6 (six) hours. 30 tablet 0   venlafaxine (EFFEXOR) 37.5 MG tablet Take 1 tablet (37.5 mg total) by mouth 2 (two) times daily. 60 tablet 3   zolmitriptan (ZOMIG) 5 MG tablet Take 5 mg by mouth daily as needed for migraine.     Facility-Administered Medications Prior to Visit  Medication Dose Route Frequency Provider Last Rate Last Admin   bupivacaine-meloxicam ER (ZYNRELEF) injection 400 mg  400 mg Infiltration Once Vickki Hearing, MD       bupivacaine-meloxicam ER (ZYNRELEF) injection 400 mg  400 mg Infiltration Once Vickki Hearing, MD        Allergies  Allergen Reactions   Cefuroxime Anaphylaxis, Hives, Shortness Of Breath, Swelling and Anxiety   Codeine Hives, Shortness Of Breath, Nausea And Vomiting, Swelling and Anxiety   Sulfa Antibiotics Anaphylaxis, Hives, Nausea And Vomiting and  Swelling    Swollen throat   Sulfamethoxazole-Trimethoprim Shortness Of Breath, Swelling and Anxiety    Throat swelling    Gluten Meal     Severe reflux - celiac disease    ROS Review of Systems  Constitutional:  Positive for fatigue. Negative for chills and fever.  HENT:  Negative for congestion, sinus pressure, sinus pain and sore throat.   Eyes:  Negative for pain and discharge.  Respiratory:  Negative for cough and shortness of breath.   Cardiovascular:  Negative for chest pain and palpitations.  Gastrointestinal:  Positive for constipation. Negative for abdominal pain, diarrhea, nausea and vomiting.  Endocrine: Negative for polydipsia and polyuria.  Genitourinary:  Negative for dysuria and hematuria.  Musculoskeletal:  Positive for arthralgias (Left knee and heel, right shoulder, elbow and hand), back pain and neck pain. Negative for neck stiffness.  Skin:  Negative for rash.  Neurological:  Positive for dizziness, weakness (Right hand) and headaches.  Psychiatric/Behavioral:  Positive for decreased concentration, dysphoric mood and sleep disturbance. Negative for agitation and behavioral problems.       Objective:    Physical Exam Vitals reviewed.  Constitutional:      General: She is not in acute distress.    Appearance: She is obese. She is not diaphoretic.  HENT:     Head: Normocephalic and  atraumatic.     Nose: Nose normal.     Mouth/Throat:     Mouth: Mucous membranes are moist.  Eyes:     General: No scleral icterus.    Extraocular Movements: Extraocular movements intact.  Cardiovascular:     Rate and Rhythm: Normal rate and regular rhythm.     Pulses: Normal pulses.     Heart sounds: Murmur (Systolic over right upper sternal border) heard.  Pulmonary:     Breath sounds: Normal breath sounds. No wheezing or rales.  Musculoskeletal:     Right shoulder: Decreased range of motion. Decreased strength.     Cervical back: Neck supple. No tenderness.     Right  lower leg: No edema.     Left lower leg: No edema.     Comments: ROM limited at left knee due to pain S/p right TKA, incision C/D/I  Skin:    General: Skin is warm.     Findings: No rash.  Neurological:     General: No focal deficit present.     Mental Status: She is alert and oriented to person, place, and time.     Cranial Nerves: No cranial nerve deficit.     Sensory: No sensory deficit.     Motor: No weakness.  Psychiatric:        Mood and Affect: Mood normal.        Behavior: Behavior normal.     BP (!) 87/65 (BP Location: Left Arm, Patient Position: Sitting, Cuff Size: Normal)   Pulse (!) 112   Ht 5\' 2"  (1.575 m)   Wt 181 lb 9.6 oz (82.4 kg)   SpO2 94%   BMI 33.22 kg/m  Wt Readings from Last 3 Encounters:  01/01/23 181 lb 9.6 oz (82.4 kg)  12/06/22 198 lb 6.6 oz (90 kg)  11/13/22 199 lb (90.3 kg)    Lab Results  Component Value Date   TSH 1.780 06/05/2022   Lab Results  Component Value Date   WBC 15.2 (H) 12/08/2022   HGB 12.8 12/08/2022   HCT 38.8 12/08/2022   MCV 98.2 12/08/2022   PLT 299 12/08/2022   Lab Results  Component Value Date   NA 136 12/07/2022   K 4.3 12/07/2022   CO2 21 (L) 12/07/2022   GLUCOSE 181 (H) 12/07/2022   BUN 13 12/07/2022   CREATININE 0.82 12/07/2022   BILITOT 0.4 06/05/2022   ALKPHOS 86 06/05/2022   AST 18 06/05/2022   ALT 21 06/05/2022   PROT 6.1 06/05/2022   ALBUMIN 4.1 06/05/2022   CALCIUM 7.8 (L) 12/07/2022   ANIONGAP 8 12/07/2022   EGFR 84 06/05/2022   Lab Results  Component Value Date   CHOL 246 (H) 06/05/2022   Lab Results  Component Value Date   HDL 41 06/05/2022   Lab Results  Component Value Date   LDLCALC 174 (H) 06/05/2022   Lab Results  Component Value Date   TRIG 165 (H) 06/05/2022   Lab Results  Component Value Date   CHOLHDL 6.0 (H) 06/05/2022   Lab Results  Component Value Date   HGBA1C 6.1 (A) 11/08/2022      Assessment & Plan:   Problem List Items Addressed This Visit        Cardiovascular and Mediastinum   HTN (hypertension) - Primary    BP Readings from Last 1 Encounters:  01/01/23 (!) 87/65  Usually well-controlled with lisinopril, metoprolol and carvedilol Hypotension today likely due to poor p.o. intake and vomiting, advised  to hold lisinopril for now until her vomiting resolves Counseled for compliance with the medications Advised DASH diet and moderate exercise/walking, at least 150 mins/week        Endocrine   Type 2 diabetes mellitus with diabetic neuropathy, unspecified    Lab Results  Component Value Date   HGBA1C 6.1 (A) 11/08/2022  Associated with HTN, GERD and depression Well controlled now On Ozempic, followed by Endocrinology On statin and ACEi Diabetic eye exam: Referred to Ophthalmology On Cymbalta for neuropathy Had added gabapentin for chronic pain        Nervous and Auditory   BPPV (benign paroxysmal positional vertigo)    Her dizziness and vomiting likely due to BPPV Meclizine as needed, needs to take 25 mg as needed Avoid sudden positional changes Maintain adequate hydration and eat at regular intervals Advised to hold lisinopril for now due to hypotension        Musculoskeletal and Integument   Osteoporosis    Gets Prolia, followed by Rheumatology in [place] Has tried oral bisphosphonates, had GERD/esophagitis        Other   MDD (major depressive disorder), recurrent episode    Better controlled since starting Effexor, offered to increase dose, but she prefers to stay on same dose for now On Cymbalta (for back pain) as well Switched from Wellbutrin to Effexor as she was having resistant depression with Wellbutrin 300 mg dose      Status post total right knee replacement    Had 1 pending staple, removed today No active bleeding or discharge Advised to participate in PT once her vertigo improves      No orders of the defined types were placed in this encounter.   Follow-up: Return in about 4 months  (around 05/03/2023) for Annual physical.    Anabel Halon, MD

## 2023-01-01 NOTE — Assessment & Plan Note (Addendum)
Lab Results  Component Value Date   HGBA1C 6.1 (A) 11/08/2022   Associated with HTN, GERD and depression Well controlled now On Ozempic, followed by Endocrinology On statin and ACEi Diabetic eye exam: Referred to Ophthalmology On Cymbalta for neuropathy Had added gabapentin for chronic pain

## 2023-01-01 NOTE — Assessment & Plan Note (Addendum)
Better controlled since starting Effexor, offered to increase dose, but she prefers to stay on same dose for now On Cymbalta (for back pain) as well Switched from Wellbutrin to Effexor as she was having resistant depression with Wellbutrin 300 mg dose

## 2023-01-03 ENCOUNTER — Ambulatory Visit (HOSPITAL_COMMUNITY): Payer: 59

## 2023-01-03 NOTE — Assessment & Plan Note (Addendum)
Her dizziness and vomiting likely due to BPPV Meclizine as needed, needs to take 25 mg as needed Avoid sudden positional changes Maintain adequate hydration and eat at regular intervals Advised to hold lisinopril for now due to hypotension

## 2023-01-03 NOTE — Assessment & Plan Note (Signed)
Gets Prolia, followed by Rheumatology in [place] Has tried oral bisphosphonates, had GERD/esophagitis

## 2023-01-03 NOTE — Assessment & Plan Note (Signed)
Had 1 pending staple, removed today No active bleeding or discharge Advised to participate in PT once her vertigo improves

## 2023-01-06 ENCOUNTER — Encounter (HOSPITAL_COMMUNITY): Payer: 59

## 2023-01-08 ENCOUNTER — Encounter (HOSPITAL_COMMUNITY): Payer: 59

## 2023-01-08 NOTE — Therapy (Signed)
OUTPATIENT PHYSICAL THERAPY LOWER EXTREMITY EVALUATION   Patient Name: Monique Warren MRN: [id number] DOB:1951-01-17, 72 y.o., female Today's Date: 01/10/2023  END OF SESSION:  PT End of Session - 01/10/23 1044     Visit Number 1    Number of Visits 8    Date for PT Re-Evaluation 02/07/23    Authorization Type [employer]    PT Start Time 1040    PT Stop Time 1115    PT Time Calculation (min) 35 min    Activity Tolerance Patient tolerated treatment well    Behavior During Therapy WFL for tasks assessed/performed             Past Medical History:  Diagnosis Date   Anxiety    Arthritis    Asthma    Celiac disease    Depression    Diabetes mellitus, type II (HCC)    Diabetic neuropathy (HCC)    Diastolic dysfunction    Grade 1 with preserved EF   Dysrhythmia    GERD (gastroesophageal reflux disease)    Heart murmur    Hiatal hernia    Sliding   Hyperlipidemia    Hypertension    Migraine    Obstructive sleep apnea    Osteoporosis    Vertigo    Past Surgical History:  Procedure Laterality Date   ABDOMINAL HYSTERECTOMY  09/29/2003   APPENDECTOMY     BALLOON DILATION N/A 02/08/2022   Procedure: BALLOON DILATION;  Surgeon: Lanelle Bal, DO;  Location: [facility];  Service: Endoscopy;  Laterality: N/A;   BIOPSY  02/08/2022   Procedure: BIOPSY;  Surgeon: Lanelle Bal, DO;  Location: [facility];  Service: Endoscopy;;   BREAST BIOPSY Left    CARPAL TUNNEL RELEASE     CATARACT EXTRACTION Bilateral    October and November 2016   COLONOSCOPY  09/2017   Blake Divine, MD in [place]; 5 mm tubular adenoma in the descending colon s/p resected, mild sigmoid diverticulosis, internal hemorrhoids.  Recommended repeat colonoscopy in 5 years.   CRANIOTOMY FOR ANEURYSM / VERTEBROBASILAR / CAROTID CIRCULATION Right 10/24/2015   ESOPHAGOGASTRODUODENOSCOPY  09/2017   [name]; irregular Z-line s/p biopsy, decreased LES tone, 4 cm hiatal hernia, erythema in gastric  antrum biopsied, normal examined duodenum biopsied.  Esophageal biopsy suggestive of reflux, no Barrett's, gastric biopsy with nonspecific chronic gastritis with intestinal metaplasia, duodenal biopsy with increased intraepithelial lymphocytes without blunting of villi, nonspecific.   ESOPHAGOGASTRODUODENOSCOPY (EGD) WITH PROPOFOL N/A 02/08/2022   Surgeon: Lanelle Bal, DO; 2 cm hiatal hernia, short segment Barrett's esophagus without dysplasia, mild Schatzki's ring dilated, gastritis with biopsies benign, normal examined duodenum.  Repeat EGD in 5 years.   KNEE ARTHROSCOPY WITH MEDIAL MENISECTOMY Left 04/09/2022   Procedure: KNEE ARTHROSCOPY WITH PARTIAL MEDIAL MENISCECTOMY, LATERAL MENISCAL DEBRIDEMENT;  Surgeon: Vickki Hearing, MD;  Location: [facility];  Service: Orthopedics;  Laterality: Left;   KNEE ARTHROSCOPY WITH MENISCAL REPAIR Left 04/09/2022   Procedure: KNEE ARTHROSCOPY WITH MEDIAL MENISCAL REPAIR;  Surgeon: Vickki Hearing, MD;  Location: [facility];  Service: Orthopedics;  Laterality: Left;   LAPAROSCOPY     TOTAL KNEE ARTHROPLASTY Right 12/06/2022   Procedure: TOTAL KNEE ARTHROPLASTY;  Surgeon: Vickki Hearing, MD;  Location: [facility];  Service: Orthopedics;  Laterality: Right;   UTERINE FIBROID SURGERY     WRIST SURGERY Bilateral    Patient Active Problem List   Diagnosis Date Noted   Primary osteoarthritis of right knee 12/06/2022  Status post total right knee replacement 12/06/2022   Chronic fatigue 10/02/2022   Acute pain of right shoulder 07/05/2022   S/P left knee arthroscopy 04/09/22 04/15/2022   Acute medial meniscus tear of left knee    Tear of lateral meniscus of left knee    Encounter for general adult medical examination with abnormal findings 04/01/2022   Dysphagia 01/16/2022   Gastroesophageal reflux disease 01/16/2022   Barrett's esophagus without dysplasia 01/16/2022   Chronic idiopathic constipation 01/16/2022   Elevated LFTs 01/16/2022   Fatty  liver 01/16/2022   History of colonic polyps 01/16/2022   Constipation 10/02/2021   Bilateral thumb pain 08/14/2021   Chest pain 07/02/2021   Hernia of abdominal cavity 06/12/2021   Vertigo 06/12/2021   Carpal tunnel syndrome of right wrist 05/16/2021   Polyneuropathy due to type 2 diabetes mellitus (HCC) 05/16/2021   MDD (major depressive disorder), recurrent episode (HCC) 04/19/2021   HTN (hypertension) 04/19/2021   BPPV (benign paroxysmal positional vertigo) 09/25/2019   Celiac disease 09/25/2019   Obstructive sleep apnea 09/25/2019   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   Hyperlipidemia 10/16/2015   Arthritis 09/27/2015   Asthma 09/27/2015   Migraine without aura or status migrainosus 09/27/2015   Other intervertebral disc degeneration, lumbosacral region 01/26/2014   Neck pain 12/31/2013   Osteoporosis 09/16/2008   Type 2 diabetes mellitus with diabetic neuropathy, unspecified (HCC) 09/16/2008   Sliding hiatal hernia 04-Jun-1951    PCP: Trena Platt; MD  REFERRING PROVIDER: Vickki Hearing, MD  REFERRING DIAG: M17.11 (ICD-10-CM) - Primary osteoarthritis of right knee  THERAPY DIAG:  Stiffness of right knee, not elsewhere classified - Plan: PT plan of care cert/re-cert  Difficulty in walking, not elsewhere classified - Plan: PT plan of care cert/re-cert  Pain of right knee after injury - Plan: PT plan of care cert/re-cert  Rationale for Evaluation and Treatment: Rehabilitation  ONSET DATE: 12/06/2022  SUBJECTIVE:   SUBJECTIVE STATEMENT: Trouble with the knees for long time; " I have a balance problem"; when woke up from surgery had severe vertigo which limited her ability to attend outpatient therapy.  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 Yes: NPRS scale: 2/10 Pain location: right knee Pain description: sore Aggravating factors:  sitting too long Relieving factors: exercise, ice  PRECAUTIONS: Fall  WEIGHT BEARING RESTRICTIONS: No  FALLS:  Has patient fallen in last 6 months? Yes. Number of falls several  LIVING ENVIRONMENT: Lives with: lives with their spouse Lives in: House/apartment Stairs: Yes: External: 4 steps; on left going up Has following equipment at home: Walker - 4 wheeled  OCCUPATION: retjred  PLOF: Independent  PATIENT GOALS: walking by myself and feeling safe; being able to work in [employer] by myself; driving  NEXT MD VISIT: today  OBJECTIVE:   DIAGNOSTIC FINDINGS:   PATIENT SURVEYS:  LEFS 11/80  COGNITION: Overall cognitive status: Within functional limits for tasks assessed     SENSATION: WFL  EDEMA:  Minimal swelling noted; does have some bruising right shin  POSTURE: rounded shoulders and forward head  PALPATION: Tenderness right knee anterior and posterior  LOWER EXTREMITY ROM:  Active ROM Right eval Left eval  Hip flexion    Hip extension    Hip abduction    Hip adduction    Hip internal rotation    Hip external rotation    Knee flexion 112   Knee  extension -13   Ankle dorsiflexion    Ankle plantarflexion    Ankle inversion    Ankle eversion     (Blank rows = not tested)  LOWER EXTREMITY MMT:  MMT Right eval Left eval  Hip flexion 4-   Hip extension    Hip abduction    Hip adduction    Hip internal rotation    Hip external rotation    Knee flexion 3+ isometric in sitting   Knee extension 3-   Ankle dorsiflexion 4-   Ankle plantarflexion    Ankle inversion    Ankle eversion     (Blank rows = not tested)   FUNCTIONAL TESTS:  5 times sit to stand: 17.97 sec using arms to push up to stand 2 minute walk test: 202 with rollator  GAIT: Distance walked: 202 ft Assistive device utilized: Walker - 4 wheeled Level of assistance: SBA Comments: slower than normal gait speed   TODAY'S TREATMENT:                                                                                                                               DATE: 01/10/2023 physical therapy evaluation and HEP    PATIENT EDUCATION:  Education details: Patient educated on exam findings, POC, scope of PT, HEP, and what to expect next visit. Person educated: Patient Education method: Explanation, Demonstration, and Handouts Education comprehension: verbalized understanding, returned demonstration, verbal cues required, and tactile cues required   HOME EXERCISE PROGRAM: Access Code: [email or web address] URL: [email or web address] Date: 01/10/2023 Prepared by: [facility]  Exercises - Supine Quad Set  - 2 x daily - 7 x weekly - 1 sets - 10 reps - 5 sec hold - Supine Knee Extension Stretch on Towel Roll  - 2 x daily - 7 x weekly - 1 sets - 1 reps - 5 min hold - Seated Passive Knee Extension  - 2 x daily - 7 x weekly - 1 sets - 1 reps - 5 min hold  ASSESSMENT:  CLINICAL IMPRESSION: Patient is a 73 y.o. female who was seen today for physical therapy evaluation and treatment for M17.11 (ICD-10-CM) - Primary osteoarthritis of right knee. Patient  presents to physical therapy with complaint of Right knee pain and stiffness; also having ongoing issues with vertigo. Patient demonstrates muscle weakness, reduced ROM, and fascial restrictions which are likely contributing to symptoms of pain and are negatively impacting patient ability to perform ADLs and functional mobility tasks. Patient will benefit from skilled physical therapy services to address these deficits to reduce pain and improve level of function with ADLs and functional mobility tasks.   OBJECTIVE IMPAIRMENTS: Abnormal gait, decreased activity tolerance, decreased balance, decreased endurance, decreased mobility, difficulty walking, decreased ROM, decreased strength, dizziness, hypomobility, increased fascial restrictions, impaired perceived functional ability, impaired flexibility, and pain.   ACTIVITY  LIMITATIONS: carrying, lifting, bending, sitting, standing, squatting, sleeping, stairs, transfers, bed mobility, bathing, toileting, dressing, locomotion level, and caring  for others  PARTICIPATION LIMITATIONS: meal prep, cleaning, laundry, driving, shopping, and community activity  PERSONAL FACTORS:  vertigo  are also affecting patient's functional outcome.   REHAB POTENTIAL: Good  CLINICAL DECISION MAKING: Stable/uncomplicated  EVALUATION COMPLEXITY: Moderate   GOALS: Goals reviewed with patient? No  SHORT TERM GOALS: Target date: 01/24/2023 patient will be independent with initial HEP   Baseline: Goal status: INITIAL  2.  Patient will improve right knee extension to -5 to increase stance phase time on the right leg with ambulation Baseline: -12 Goal status: INITIAL    LONG TERM GOALS: Target date: 02/07/2023  Patient will be independent in self management strategies to improve quality of life and functional outcomes.  Baseline:  Goal status: INITIAL  2.  Patient will increase right leg MMTs to 4+-5/5 without pain to promote return to ambulation community distances with minimal deviation.  Baseline: see above Goal status: INITIAL  3.  Patient will self report 50% improvement to improve tolerance for functional activity   Baseline:  Goal status: INITIAL  4.  Patient will increased knee mobility to -2 to 120 to promote normal navigation of steps; step over step pattern  Baseline:  Goal status: INITIAL  5.  Patient will increase distance on to 250 ft  to demonstrate improved functional mobility walking household and community distances.   Baseline: 202 ft with rollator Goal status: INITIAL   PLAN:  PT FREQUENCY: 2x/week  PT DURATION: 4 weeks  PLANNED INTERVENTIONS: Therapeutic exercises, Therapeutic activity, Neuromuscular re-education, Balance training, Gait training, Patient/Family education, Joint manipulation, Joint mobilization, Stair  training, Orthotic/Fit training, DME instructions, Aquatic Therapy, Dry Needling, Electrical stimulation, Spinal manipulation, Spinal mobilization, Cryotherapy, Moist heat, Compression bandaging, scar mobilization, Splintting, Taping, Traction, Ultrasound, Ionotophoresis 4mg /ml Dexamethasone, and Manual therapy   PLAN FOR NEXT SESSION: patient unable to lie completely flat due to vertigo; review HEP and goals; progress right knee mobility and strength as able  1:28 PM, 01/10/23 Varina Hulon Small Kaylei Frink MPT [facility] [place] [phone] Ph:[phone]

## 2023-01-10 ENCOUNTER — Encounter: Payer: Self-pay | Admitting: Orthopedic Surgery

## 2023-01-10 ENCOUNTER — Ambulatory Visit (HOSPITAL_COMMUNITY): Payer: 59 | Attending: Orthopedic Surgery

## 2023-01-10 ENCOUNTER — Other Ambulatory Visit: Payer: Self-pay | Admitting: Internal Medicine

## 2023-01-10 ENCOUNTER — Other Ambulatory Visit: Payer: Self-pay

## 2023-01-10 ENCOUNTER — Ambulatory Visit (INDEPENDENT_AMBULATORY_CARE_PROVIDER_SITE_OTHER): Payer: 59 | Admitting: Orthopedic Surgery

## 2023-01-10 DIAGNOSIS — M25661 Stiffness of right knee, not elsewhere classified: Secondary | ICD-10-CM | POA: Insufficient documentation

## 2023-01-10 DIAGNOSIS — M25561 Pain in right knee: Secondary | ICD-10-CM | POA: Diagnosis present

## 2023-01-10 DIAGNOSIS — R262 Difficulty in walking, not elsewhere classified: Secondary | ICD-10-CM | POA: Diagnosis present

## 2023-01-10 DIAGNOSIS — Z96651 Presence of right artificial knee joint: Secondary | ICD-10-CM

## 2023-01-10 NOTE — Progress Notes (Signed)
   This is a follow-up appointment  Encounter Diagnosis  Name Primary?   S/P total knee replacement, right 12/06/22 Yes     Chief Complaint  Patient presents with   Post-op Follow-up    Knee replacement right / has gone for PT assessment today,     Postop visit #2 she is postop week #5 she has not gone to therapy she has basically done her own therapy she had an assessment today she will start Monday with formalized physical therapy  Fortunately she has regained all of her flexion and she has 5 degree to 8 degree flexion contracture which we will try to work out in physical therapy  Her vertigo has been managed by Dr. Allena Katz with double doses of meclizine which also controls her nausea  Her incision is clean she has minimal swelling she has no signs of DVT I will see her in 6 weeks

## 2023-01-13 ENCOUNTER — Ambulatory Visit (HOSPITAL_COMMUNITY): Payer: 59

## 2023-01-13 ENCOUNTER — Encounter (HOSPITAL_COMMUNITY): Payer: Self-pay

## 2023-01-13 DIAGNOSIS — R262 Difficulty in walking, not elsewhere classified: Secondary | ICD-10-CM

## 2023-01-13 DIAGNOSIS — M25561 Pain in right knee: Secondary | ICD-10-CM

## 2023-01-13 DIAGNOSIS — M25661 Stiffness of right knee, not elsewhere classified: Secondary | ICD-10-CM

## 2023-01-13 NOTE — Therapy (Signed)
OUTPATIENT PHYSICAL THERAPY LOWER EXTREMITY EVALUATION   Patient Name: Monique Warren MRN: [id number] DOB:18-Jun-1951, 72 y.o., female Today's Date: 01/13/2023  END OF SESSION:  PT End of Session - 01/13/23 1135     Visit Number 2    Number of Visits 8    Date for PT Re-Evaluation 02/07/23    Authorization Type [employer]    PT Start Time 1133    PT Stop Time 1213    PT Time Calculation (min) 40 min    Activity Tolerance Patient tolerated treatment well    Behavior During Therapy WFL for tasks assessed/performed              Past Medical History:  Diagnosis Date   Anxiety    Arthritis    Asthma    Celiac disease    Depression    Diabetes mellitus, type II (HCC)    Diabetic neuropathy (HCC)    Diastolic dysfunction    Grade 1 with preserved EF   Dysrhythmia    GERD (gastroesophageal reflux disease)    Heart murmur    Hiatal hernia    Sliding   Hyperlipidemia    Hypertension    Migraine    Obstructive sleep apnea    Osteoporosis    Vertigo    Past Surgical History:  Procedure Laterality Date   ABDOMINAL HYSTERECTOMY  09/29/2003   APPENDECTOMY     BALLOON DILATION N/A 02/08/2022   Procedure: BALLOON DILATION;  Surgeon: Lanelle Bal, DO;  Location: [facility];  Service: Endoscopy;  Laterality: N/A;   BIOPSY  02/08/2022   Procedure: BIOPSY;  Surgeon: Lanelle Bal, DO;  Location: [facility];  Service: Endoscopy;;   BREAST BIOPSY Left    CARPAL TUNNEL RELEASE     CATARACT EXTRACTION Bilateral    October and November 2016   COLONOSCOPY  09/2017   Blake Divine, MD in [place]; 5 mm tubular adenoma in the descending colon s/p resected, mild sigmoid diverticulosis, internal hemorrhoids.  Recommended repeat colonoscopy in 5 years.   CRANIOTOMY FOR ANEURYSM / VERTEBROBASILAR / CAROTID CIRCULATION Right 10/24/2015   ESOPHAGOGASTRODUODENOSCOPY  09/2017   [name]; irregular Z-line s/p biopsy, decreased LES tone, 4 cm hiatal hernia, erythema in gastric  antrum biopsied, normal examined duodenum biopsied.  Esophageal biopsy suggestive of reflux, no Barrett's, gastric biopsy with nonspecific chronic gastritis with intestinal metaplasia, duodenal biopsy with increased intraepithelial lymphocytes without blunting of villi, nonspecific.   ESOPHAGOGASTRODUODENOSCOPY (EGD) WITH PROPOFOL N/A 02/08/2022   Surgeon: Lanelle Bal, DO; 2 cm hiatal hernia, short segment Barrett's esophagus without dysplasia, mild Schatzki's ring dilated, gastritis with biopsies benign, normal examined duodenum.  Repeat EGD in 5 years.   KNEE ARTHROSCOPY WITH MEDIAL MENISECTOMY Left 04/09/2022   Procedure: KNEE ARTHROSCOPY WITH PARTIAL MEDIAL MENISCECTOMY, LATERAL MENISCAL DEBRIDEMENT;  Surgeon: Vickki Hearing, MD;  Location: [facility];  Service: Orthopedics;  Laterality: Left;   KNEE ARTHROSCOPY WITH MENISCAL REPAIR Left 04/09/2022   Procedure: KNEE ARTHROSCOPY WITH MEDIAL MENISCAL REPAIR;  Surgeon: Vickki Hearing, MD;  Location: [facility];  Service: Orthopedics;  Laterality: Left;   LAPAROSCOPY     TOTAL KNEE ARTHROPLASTY Right 12/06/2022   Procedure: TOTAL KNEE ARTHROPLASTY;  Surgeon: Vickki Hearing, MD;  Location: [facility];  Service: Orthopedics;  Laterality: Right;   UTERINE FIBROID SURGERY     WRIST SURGERY Bilateral    Patient Active Problem List   Diagnosis Date Noted   Primary osteoarthritis of right knee 12/06/2022  Status post total right knee replacement 12/06/2022   Chronic fatigue 10/02/2022   Acute pain of right shoulder 07/05/2022   S/P left knee arthroscopy 04/09/22 04/15/2022   Acute medial meniscus tear of left knee    Tear of lateral meniscus of left knee    Encounter for general adult medical examination with abnormal findings 04/01/2022   Dysphagia 01/16/2022   Gastroesophageal reflux disease 01/16/2022   Barrett's esophagus without dysplasia 01/16/2022   Chronic idiopathic constipation 01/16/2022   Elevated LFTs 01/16/2022   Fatty  liver 01/16/2022   History of colonic polyps 01/16/2022   Constipation 10/02/2021   Bilateral thumb pain 08/14/2021   Chest pain 07/02/2021   Hernia of abdominal cavity 06/12/2021   Vertigo 06/12/2021   Carpal tunnel syndrome of right wrist 05/16/2021   Polyneuropathy due to type 2 diabetes mellitus (HCC) 05/16/2021   MDD (major depressive disorder), recurrent episode (HCC) 04/19/2021   HTN (hypertension) 04/19/2021   BPPV (benign paroxysmal positional vertigo) 09/25/2019   Celiac disease 09/25/2019   Obstructive sleep apnea 09/25/2019   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   Hyperlipidemia 10/16/2015   Arthritis 09/27/2015   Asthma 09/27/2015   Migraine without aura or status migrainosus 09/27/2015   Other intervertebral disc degeneration, lumbosacral region 01/26/2014   Neck pain 12/31/2013   Osteoporosis 09/16/2008   Type 2 diabetes mellitus with diabetic neuropathy, unspecified (HCC) 09/16/2008   Sliding hiatal hernia April 01, 1951    PCP: Trena Platt; MD  REFERRING PROVIDER: Vickki Hearing, MD  REFERRING DIAG: M17.11 (ICD-10-CM) - Primary osteoarthritis of right knee  THERAPY DIAG:  Stiffness of right knee, not elsewhere classified  Difficulty in walking, not elsewhere classified  Pain of right knee after injury  Rationale for Evaluation and Treatment: Rehabilitation  ONSET DATE: 12/06/2022  SUBJECTIVE:   SUBJECTIVE STATEMENT: 01/13/23:  Reports she had a vertigo episode this morning and took half a Meclizine.  Stated she has began the HEP without questions.  Stated she completed exercises this morning and feels stiff today.  Wishes to be able to go to kitchen and stand for long periods of time.    Eval:  Trouble with the knees for long time; " I have a balance problem"; when woke up from surgery had severe vertigo which limited her ability to attend outpatient therapy.  She did have home therapy; reports history of falls  PERTINENT HISTORY: 2017  aneurysm behind right eye; vertigo ever since PAIN:  Are you having pain? Yes: NPRS scale: 2/10 Pain location: right knee Pain description: sore Aggravating factors: sitting too long Relieving factors: exercise, ice  PRECAUTIONS: Fall  WEIGHT BEARING RESTRICTIONS: No  FALLS:  Has patient fallen in last 6 months? Yes. Number of falls several  LIVING ENVIRONMENT: Lives with: lives with their spouse Lives in: House/apartment Stairs: Yes: External: 4 steps; on left going up Has following equipment at home: Walker - 4 wheeled  OCCUPATION: retjred  PLOF: Independent  PATIENT GOALS: walking by myself and feeling safe; being able to work in [employer] by myself; driving  NEXT MD VISIT: today  OBJECTIVE:   DIAGNOSTIC FINDINGS:   PATIENT SURVEYS:  LEFS 11/80  COGNITION: Overall cognitive status: Within functional limits for tasks assessed     SENSATION: WFL  EDEMA:  Minimal swelling noted; does have some bruising right shin  POSTURE: rounded shoulders and forward head  PALPATION: Tenderness right knee anterior and posterior  LOWER EXTREMITY ROM:  Active ROM Right eval Left eval  Hip flexion  Hip extension    Hip abduction    Hip adduction    Hip internal rotation    Hip external rotation    Knee flexion 112   Knee extension -13   Ankle dorsiflexion    Ankle plantarflexion    Ankle inversion    Ankle eversion     (Blank rows = not tested)  LOWER EXTREMITY MMT:  MMT Right eval Left eval  Hip flexion 4-   Hip extension    Hip abduction    Hip adduction    Hip internal rotation    Hip external rotation    Knee flexion 3+ isometric in sitting   Knee extension 3-   Ankle dorsiflexion 4-   Ankle plantarflexion    Ankle inversion    Ankle eversion     (Blank rows = not tested)   FUNCTIONAL TESTS:  5 times sit to stand: 17.97 sec using arms to push up to stand 2 minute walk test: 202 with rollator  GAIT: Distance walked: 202 ft Assistive  device utilized: Environmental consultant - 4 wheeled Level of assistance: SBA Comments: slower than normal gait speed   TODAY'S TREATMENT:                                                                                                                              DATE:  01/13/23:   Reviewed goals Educated importance of HEP compliance for maximal benefits  Prone knee hang 2' Seated with heel prop for extension x3 min  Supine: Quad sets - encouraged to complete 100 a day. SAQ 10x 5" Hamstring stretch 3x 30" 1 with hands behind knee; 2 with rope    01/10/2023 physical therapy evaluation and HEP    PATIENT EDUCATION:  Education details: Patient educated on exam findings, POC, scope of PT, HEP, and what to expect next visit. Person educated: Patient Education method: Explanation, Demonstration, and Handouts Education comprehension: verbalized understanding, returned demonstration, verbal cues required, and tactile cues required   HOME EXERCISE PROGRAM: Access Code: [email or web address] URL: [email or web address] Date: 01/10/2023 Prepared by: [facility]  Exercises - Supine Quad Set  - 2 x daily - 7 x weekly - 1 sets - 10 reps - 5 sec hold - Supine Knee Extension Stretch on Towel Roll  - 2 x daily - 7 x weekly - 1 sets - 1 reps - 5 min hold - Seated Passive Knee Extension  - 2 x daily - 7 x weekly - 1 sets - 1 reps - 5 min hold  01/13/23:  Prone  ASSESSMENT:  CLINICAL IMPRESSION: 01/13/23:  Reviewed goals and educated importance of HEP compliance.  Session focus with knee mobility main focus knee extension.  Pt presents with weak quadriceps, improved contraction with tactile cueing and SAQ exercise.  Added SAQ, hamstring stretch and prone knee hang to HEP with print out given.    Patient is a 72 y.o. female who was seen today for physical therapy  evaluation and treatment for M17.11 (ICD-10-CM) - Primary osteoarthritis of right knee. Patient  presents to physical therapy with complaint of Right  knee pain and stiffness; also having ongoing issues with vertigo. Patient demonstrates muscle weakness, reduced ROM, and fascial restrictions which are likely contributing to symptoms of pain and are negatively impacting patient ability to perform ADLs and functional mobility tasks. Patient will benefit from skilled physical therapy services to address these deficits to reduce pain and improve level of function with ADLs and functional mobility tasks.   OBJECTIVE IMPAIRMENTS: Abnormal gait, decreased activity tolerance, decreased balance, decreased endurance, decreased mobility, difficulty walking, decreased ROM, decreased strength, dizziness, hypomobility, increased fascial restrictions, impaired perceived functional ability, impaired flexibility, and pain.   ACTIVITY LIMITATIONS: carrying, lifting, bending, sitting, standing, squatting, sleeping, stairs, transfers, bed mobility, bathing, toileting, dressing, locomotion level, and caring for others  PARTICIPATION LIMITATIONS: meal prep, cleaning, laundry, driving, shopping, and community activity  PERSONAL FACTORS:  vertigo  are also affecting patient's functional outcome.   REHAB POTENTIAL: Good  CLINICAL DECISION MAKING: Stable/uncomplicated  EVALUATION COMPLEXITY: Moderate   GOALS: Goals reviewed with patient? No  SHORT TERM GOALS: Target date: 01/24/2023 patient will be independent with initial HEP   Baseline: Goal status: IN PROGRESS  2.  Patient will improve right knee extension to -5 to increase stance phase time on the right leg with ambulation Baseline: -12 Goal status: IN PROGRESS    LONG TERM GOALS: Target date: 02/07/2023  Patient will be independent in self management strategies to improve quality of life and functional outcomes.  Baseline:  Goal status: IN PROGRESS  2.  Patient will increase right leg MMTs to 4+-5/5 without pain to promote return to ambulation community distances with minimal  deviation.  Baseline: see above Goal status: IN PROGRESS  3.  Patient will self report 50% improvement to improve tolerance for functional activity   Baseline:  Goal status: IN PROGRESS  4.  Patient will increased knee mobility to -2 to 120 to promote normal navigation of steps; step over step pattern  Baseline:  Goal status: IN PROGRESS  5.  Patient will increase distance on to 250 ft  to demonstrate improved functional mobility walking household and community distances.   Baseline: 202 ft with rollator Goal status: IN PROGRESS   PLAN:  PT FREQUENCY: 2x/week  PT DURATION: 4 weeks  PLANNED INTERVENTIONS: Therapeutic exercises, Therapeutic activity, Neuromuscular re-education, Balance training, Gait training, Patient/Family education, Joint manipulation, Joint mobilization, Stair training, Orthotic/Fit training, DME instructions, Aquatic Therapy, Dry Needling, Electrical stimulation, Spinal manipulation, Spinal mobilization, Cryotherapy, Moist heat, Compression bandaging, scar mobilization, Splintting, Taping, Traction, Ultrasound, Ionotophoresis 4mg /ml Dexamethasone, and Manual therapy   PLAN FOR NEXT SESSION: patient unable to lie completely flat due to vertigo; review HEP and goals; progress right knee mobility and strength as able  Becky Sax, LPTA/CLT; CBIS [phone]  12:23 PM, 01/13/23

## 2023-01-15 ENCOUNTER — Encounter (HOSPITAL_COMMUNITY): Payer: 59

## 2023-01-17 ENCOUNTER — Encounter (HOSPITAL_COMMUNITY): Payer: Self-pay

## 2023-01-17 ENCOUNTER — Ambulatory Visit (HOSPITAL_COMMUNITY): Payer: 59 | Attending: Orthopedic Surgery

## 2023-01-17 DIAGNOSIS — M25541 Pain in joints of right hand: Secondary | ICD-10-CM | POA: Insufficient documentation

## 2023-01-17 DIAGNOSIS — M25641 Stiffness of right hand, not elsewhere classified: Secondary | ICD-10-CM | POA: Diagnosis present

## 2023-01-17 DIAGNOSIS — M25561 Pain in right knee: Secondary | ICD-10-CM | POA: Diagnosis present

## 2023-01-17 DIAGNOSIS — R262 Difficulty in walking, not elsewhere classified: Secondary | ICD-10-CM | POA: Insufficient documentation

## 2023-01-17 DIAGNOSIS — M25661 Stiffness of right knee, not elsewhere classified: Secondary | ICD-10-CM | POA: Diagnosis present

## 2023-01-17 NOTE — Therapy (Signed)
OUTPATIENT PHYSICAL THERAPY LOWER EXTREMITY EVALUATION   Patient Name: Monique Warren MRN: [id number] DOB:[birth year], 72 y.o., female Today's Date: 01/17/2023  END OF SESSION:  PT End of Session - 01/17/23 1547     Visit Number 3    Number of Visits 8    Date for PT Re-Evaluation 02/07/23    Authorization Type [employer]    PT Start Time 1133    PT Stop Time 1218    PT Time Calculation (min) 45 min    Activity Tolerance Patient tolerated treatment well    Behavior During Therapy WFL for tasks assessed/performed               Past Medical History:  Diagnosis Date   Anxiety    Arthritis    Asthma    Celiac disease    Depression    Diabetes mellitus, type II (HCC)    Diabetic neuropathy (HCC)    Diastolic dysfunction    Grade 1 with preserved EF   Dysrhythmia    GERD (gastroesophageal reflux disease)    Heart murmur    Hiatal hernia    Sliding   Hyperlipidemia    Hypertension    Migraine    Obstructive sleep apnea    Osteoporosis    Vertigo    Past Surgical History:  Procedure Laterality Date   ABDOMINAL HYSTERECTOMY  09/29/2003   APPENDECTOMY     BALLOON DILATION N/A 02/08/2022   Procedure: BALLOON DILATION;  Surgeon: Lanelle Bal, DO;  [facility]: [facility];  Service: Endoscopy;  Laterality: N/A;   BIOPSY  02/08/2022   Procedure: BIOPSY;  Surgeon: Lanelle Bal, DO;  [facility];  Service: Endoscopy;;   BREAST BIOPSY Left    CARPAL TUNNEL RELEASE     CATARACT EXTRACTION Bilateral    October and November 2016   COLONOSCOPY  09/2017   Blake Divine, MD in [place]; 5 mm tubular adenoma in the descending colon s/p resected, mild sigmoid diverticulosis, internal hemorrhoids.  Recommended repeat colonoscopy in 5 years.   CRANIOTOMY FOR ANEURYSM / VERTEBROBASILAR / CAROTID CIRCULATION Right 10/24/2015   ESOPHAGOGASTRODUODENOSCOPY  09/2017   [name]; irregular Z-line s/p biopsy, decreased LES tone, 4 cm hiatal hernia, erythema in gastric  antrum biopsied, normal examined duodenum biopsied.  Esophageal biopsy suggestive of reflux, no Barrett's, gastric biopsy with nonspecific chronic gastritis with intestinal metaplasia, duodenal biopsy with increased intraepithelial lymphocytes without blunting of villi, nonspecific.   ESOPHAGOGASTRODUODENOSCOPY (EGD) WITH PROPOFOL N/A 02/08/2022   Surgeon: Lanelle Bal, DO; 2 cm hiatal hernia, short segment Barrett's esophagus without dysplasia, mild Schatzki's ring dilated, gastritis with biopsies benign, normal examined duodenum.  Repeat EGD in 5 years.   KNEE ARTHROSCOPY WITH MEDIAL MENISECTOMY Left 04/09/2022   Procedure: KNEE ARTHROSCOPY WITH PARTIAL MEDIAL MENISCECTOMY, LATERAL MENISCAL DEBRIDEMENT;  Surgeon: Vickki Hearing, MD;  Location: [facility];  Service: Orthopedics;  Laterality: Left;   KNEE ARTHROSCOPY WITH MENISCAL REPAIR Left 04/09/2022   Procedure: KNEE ARTHROSCOPY WITH MEDIAL MENISCAL REPAIR;  Surgeon: Vickki Hearing, MD;  Location: [facility];  Service: Orthopedics;  Laterality: Left;   LAPAROSCOPY     TOTAL KNEE ARTHROPLASTY Right 12/06/2022   Procedure: TOTAL KNEE ARTHROPLASTY;  Surgeon: Vickki Hearing, MD;  Location: [facility];  Service: Orthopedics;  Laterality: Right;   UTERINE FIBROID SURGERY     WRIST SURGERY Bilateral    Patient Active Problem List   Diagnosis Date Noted   Primary osteoarthritis of right knee 12/06/2022  Status post total right knee replacement 12/06/2022   Chronic fatigue 10/02/2022   Acute pain of right shoulder 07/05/2022   S/P left knee arthroscopy 04/09/22 04/15/2022   Acute medial meniscus tear of left knee    Tear of lateral meniscus of left knee    Encounter for general adult medical examination with abnormal findings 04/01/2022   Dysphagia 01/16/2022   Gastroesophageal reflux disease 01/16/2022   Barrett's esophagus without dysplasia 01/16/2022   Chronic idiopathic constipation 01/16/2022   Elevated LFTs 01/16/2022   Fatty  liver 01/16/2022   History of colonic polyps 01/16/2022   Constipation 10/02/2021   Bilateral thumb pain 08/14/2021   Chest pain 07/02/2021   Hernia of abdominal cavity 06/12/2021   Vertigo 06/12/2021   Carpal tunnel syndrome of right wrist 05/16/2021   Polyneuropathy due to type 2 diabetes mellitus (HCC) 05/16/2021   MDD (major depressive disorder), recurrent episode (HCC) 04/19/2021   HTN (hypertension) 04/19/2021   BPPV (benign paroxysmal positional vertigo) 09/25/2019   Celiac disease 09/25/2019   Obstructive sleep apnea 09/25/2019   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   Hyperlipidemia 10/16/2015   Arthritis 09/27/2015   Asthma 09/27/2015   Migraine without aura or status migrainosus 09/27/2015   Other intervertebral disc degeneration, lumbosacral region 01/26/2014   Neck pain 12/31/2013   Osteoporosis 09/16/2008   Type 2 diabetes mellitus with diabetic neuropathy, unspecified (HCC) 09/16/2008   Sliding hiatal hernia January 14, 1951    PCP: Trena Platt; MD  REFERRING PROVIDER: Vickki Hearing, MD  REFERRING DIAG: M17.11 (ICD-10-CM) - Primary osteoarthritis of right knee  THERAPY DIAG:  Stiffness of right knee, not elsewhere classified  Difficulty in walking, not elsewhere classified  Pain of right knee after injury  Rationale for Evaluation and Treatment: Rehabilitation  ONSET DATE: 12/06/2022  SUBJECTIVE:   SUBJECTIVE STATEMENT: 01/17/23:  reports she got sick on her stomach due to knee pain, reason had to cancel apt on Wednesday.  Knee is sore today, pain scale 3/10.  Eval:  Trouble with the knees for long time; " I have a balance problem"; when woke up from surgery had severe vertigo which limited her ability to attend outpatient therapy.  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 Yes: NPRS scale: 3/10 Pain location: right knee Pain  description: sore Aggravating factors: sitting too long Relieving factors: exercise, ice  PRECAUTIONS: Fall  WEIGHT BEARING RESTRICTIONS: No  FALLS:  Has patient fallen in last 6 months? Yes. Number of falls several  LIVING ENVIRONMENT: Lives with: lives with their spouse Lives in: House/apartment Stairs: Yes: External: 4 steps; on left going up Has following equipment at home: Walker - 4 wheeled  OCCUPATION: retjred  PLOF: Independent  PATIENT GOALS: walking by myself and feeling safe; being able to work in [employer] by myself; driving  NEXT MD VISIT: today  OBJECTIVE:   DIAGNOSTIC FINDINGS:   PATIENT SURVEYS:  LEFS 11/80  COGNITION: Overall cognitive status: Within functional limits for tasks assessed     SENSATION: WFL  EDEMA:  Minimal swelling noted; does have some bruising right shin  POSTURE: rounded shoulders and forward head  PALPATION: Tenderness right knee anterior and posterior  LOWER EXTREMITY ROM:  Active ROM Right eval Left eval Right 01/17/23  Hip flexion     Hip extension     Hip abduction     Hip adduction     Hip internal  rotation     Hip external rotation     Knee flexion 112  110  Knee extension -13  10  Ankle dorsiflexion     Ankle plantarflexion     Ankle inversion     Ankle eversion      (Blank rows = not tested)  LOWER EXTREMITY MMT:  MMT Right eval Left eval  Hip flexion 4-   Hip extension    Hip abduction    Hip adduction    Hip internal rotation    Hip external rotation    Knee flexion 3+ isometric in sitting   Knee extension 3-   Ankle dorsiflexion 4-   Ankle plantarflexion    Ankle inversion    Ankle eversion     (Blank rows = not tested)   FUNCTIONAL TESTS:  5 times sit to stand: 17.97 sec using arms to push up to stand 2 minute walk test: 202 with rollator  GAIT: Distance walked: 202 ft Assistive device utilized: Environmental consultant - 4 wheeled Level of assistance: SBA Comments: slower than normal gait  speed   TODAY'S TREATMENT:                                                                                                                              DATE:  01/17/23:   Rec bike seat 8 x 3 min  Standing:  heel raise 10x 2 Toe raise 10x  Slant board 2x 30" TKE 10x 5" with YTB  Seated: LAQ with DF 10x 3" holds  Supine: quad sets 10x 5" Heel slide 10x AROM 10-110   01/13/23:   Reviewed goals Educated importance of HEP compliance for maximal benefits  Prone knee hang 2' Seated with heel prop for extension x3 min  Supine: Quad sets - encouraged to complete 100 a day. SAQ 10x 5" Hamstring stretch 3x 30" 1 with hands behind knee; 2 with rope    01/10/2023 physical therapy evaluation and HEP    PATIENT EDUCATION:  Education details: Patient educated on exam findings, POC, scope of PT, HEP, and what to expect next visit. Person educated: Patient Education method: Explanation, Demonstration, and Handouts Education comprehension: verbalized understanding, returned demonstration, verbal cues required, and tactile cues required   HOME EXERCISE PROGRAM: Access Code: [email or web address] URL: [email or web address] Date: 01/10/2023 Prepared by: [facility]  Exercises - Supine Quad Set  - 2 x daily - 7 x weekly - 1 sets - 10 reps - 5 sec hold - Supine Knee Extension Stretch on Towel Roll  - 2 x daily - 7 x weekly - 1 sets - 1 reps - 5 min hold - Seated Passive Knee Extension  - 2 x daily - 7 x weekly - 1 sets - 1 reps - 5 min hold  01/13/23:  Prone knee hang  ASSESSMENT:  CLINICAL IMPRESSION: 01/17/23:  Session focus with knee mobility and quad strengthening.   Pt limited by pain with movements.  Pt presents  with weak quadriceps, required multimodal cueing to improve contraction.  Encouraged to hold for increased hold times and to complete 100 times a day.  Added TKE based exercises and instructed proper gait mechanics.  Discussed calf pain and educated on risks of DVT and to  call 911 if has symptoms.  Pt stated she is not taking her aspirin, advised to share with MD.    Patient is a 72 y.o. female who was seen today for physical therapy evaluation and treatment for M17.11 (ICD-10-CM) - Primary osteoarthritis of right knee. Patient  presents to physical therapy with complaint of Right knee pain and stiffness; also having ongoing issues with vertigo. Patient demonstrates muscle weakness, reduced ROM, and fascial restrictions which are likely contributing to symptoms of pain and are negatively impacting patient ability to perform ADLs and functional mobility tasks. Patient will benefit from skilled physical therapy services to address these deficits to reduce pain and improve level of function with ADLs and functional mobility tasks.   OBJECTIVE IMPAIRMENTS: Abnormal gait, decreased activity tolerance, decreased balance, decreased endurance, decreased mobility, difficulty walking, decreased ROM, decreased strength, dizziness, hypomobility, increased fascial restrictions, impaired perceived functional ability, impaired flexibility, and pain.   ACTIVITY LIMITATIONS: carrying, lifting, bending, sitting, standing, squatting, sleeping, stairs, transfers, bed mobility, bathing, toileting, dressing, locomotion level, and caring for others  PARTICIPATION LIMITATIONS: meal prep, cleaning, laundry, driving, shopping, and community activity  PERSONAL FACTORS:  vertigo  are also affecting patient's functional outcome.   REHAB POTENTIAL: Good  CLINICAL DECISION MAKING: Stable/uncomplicated  EVALUATION COMPLEXITY: Moderate   GOALS: Goals reviewed with patient? No  SHORT TERM GOALS: Target date: 01/24/2023 patient will be independent with initial HEP   Baseline: Goal status: IN PROGRESS  2.  Patient will improve right knee extension to -5 to increase stance phase time on the right leg with ambulation Baseline: -12 Goal status: IN PROGRESS    LONG TERM GOALS: Target  date: 02/07/2023  Patient will be independent in self management strategies to improve quality of life and functional outcomes.  Baseline:  Goal status: IN PROGRESS  2.  Patient will increase right leg MMTs to 4+-5/5 without pain to promote return to ambulation community distances with minimal deviation.  Baseline: see above Goal status: IN PROGRESS  3.  Patient will self report 50% improvement to improve tolerance for functional activity   Baseline:  Goal status: IN PROGRESS  4.  Patient will increased knee mobility to -2 to 120 to promote normal navigation of steps; step over step pattern  Baseline:  Goal status: IN PROGRESS  5.  Patient will increase distance on to 250 ft  to demonstrate improved functional mobility walking household and community distances.   Baseline: 202 ft with rollator Goal status: IN PROGRESS   PLAN:  PT FREQUENCY: 2x/week  PT DURATION: 4 weeks  PLANNED INTERVENTIONS: Therapeutic exercises, Therapeutic activity, Neuromuscular re-education, Balance training, Gait training, Patient/Family education, Joint manipulation, Joint mobilization, Stair training, Orthotic/Fit training, DME instructions, Aquatic Therapy, Dry Needling, Electrical stimulation, Spinal manipulation, Spinal mobilization, Cryotherapy, Moist heat, Compression bandaging, scar mobilization, Splintting, Taping, Traction, Ultrasound, Ionotophoresis 4mg /ml Dexamethasone, and Manual therapy   PLAN FOR NEXT SESSION: patient unable to lie completely flat due to vertigo; review HEP and goals; progress right knee mobility and strength as able  Becky Sax, LPTA/CLT; [facility] [phone]  3:48 PM, 01/17/23

## 2023-01-20 ENCOUNTER — Other Ambulatory Visit
Admission: RE | Admit: 2023-01-20 | Discharge: 2023-01-20 | Disposition: A | Discharge status: Home / Self Care | Payer: 59 | Source: Ambulatory Visit | Attending: Internal Medicine | Admitting: Internal Medicine

## 2023-01-20 ENCOUNTER — Encounter (HOSPITAL_COMMUNITY): Payer: 59

## 2023-01-20 ENCOUNTER — Ambulatory Visit (INDEPENDENT_AMBULATORY_CARE_PROVIDER_SITE_OTHER): Payer: 59 | Admitting: Internal Medicine

## 2023-01-20 ENCOUNTER — Encounter: Payer: Self-pay | Admitting: Internal Medicine

## 2023-01-20 ENCOUNTER — Ambulatory Visit (INDEPENDENT_AMBULATORY_CARE_PROVIDER_SITE_OTHER): Payer: 59

## 2023-01-20 VITALS — BP 136/84 | HR 84 | Ht 62.0 in | Wt 182.6 lb

## 2023-01-20 DIAGNOSIS — Z87891 Personal history of nicotine dependence: Secondary | ICD-10-CM | POA: Diagnosis not present

## 2023-01-20 DIAGNOSIS — E782 Mixed hyperlipidemia: Secondary | ICD-10-CM

## 2023-01-20 DIAGNOSIS — E785 Hyperlipidemia, unspecified: Secondary | ICD-10-CM | POA: Insufficient documentation

## 2023-01-20 DIAGNOSIS — E1140 Type 2 diabetes mellitus with diabetic neuropathy, unspecified: Secondary | ICD-10-CM | POA: Insufficient documentation

## 2023-01-20 DIAGNOSIS — Z7982 Long term (current) use of aspirin: Secondary | ICD-10-CM | POA: Diagnosis not present

## 2023-01-20 DIAGNOSIS — E119 Type 2 diabetes mellitus without complications: Secondary | ICD-10-CM | POA: Diagnosis not present

## 2023-01-20 DIAGNOSIS — F32A Depression, unspecified: Secondary | ICD-10-CM | POA: Insufficient documentation

## 2023-01-20 DIAGNOSIS — Z86010 Personal history of colonic polyps: Secondary | ICD-10-CM | POA: Diagnosis not present

## 2023-01-20 DIAGNOSIS — G4733 Obstructive sleep apnea (adult) (pediatric): Secondary | ICD-10-CM | POA: Diagnosis not present

## 2023-01-20 DIAGNOSIS — I2510 Atherosclerotic heart disease of native coronary artery without angina pectoris: Secondary | ICD-10-CM | POA: Diagnosis not present

## 2023-01-20 DIAGNOSIS — K2100 Gastro-esophageal reflux disease with esophagitis, without bleeding: Secondary | ICD-10-CM | POA: Diagnosis not present

## 2023-01-20 DIAGNOSIS — I119 Hypertensive heart disease without heart failure: Secondary | ICD-10-CM | POA: Insufficient documentation

## 2023-01-20 DIAGNOSIS — I421 Obstructive hypertrophic cardiomyopathy: Secondary | ICD-10-CM | POA: Diagnosis not present

## 2023-01-20 DIAGNOSIS — F419 Anxiety disorder, unspecified: Secondary | ICD-10-CM | POA: Insufficient documentation

## 2023-01-20 DIAGNOSIS — M810 Age-related osteoporosis without current pathological fracture: Secondary | ICD-10-CM | POA: Diagnosis not present

## 2023-01-20 DIAGNOSIS — R0789 Other chest pain: Secondary | ICD-10-CM | POA: Insufficient documentation

## 2023-01-20 DIAGNOSIS — Z823 Family history of stroke: Secondary | ICD-10-CM | POA: Diagnosis not present

## 2023-01-20 DIAGNOSIS — J45909 Unspecified asthma, uncomplicated: Secondary | ICD-10-CM | POA: Diagnosis not present

## 2023-01-20 DIAGNOSIS — Z79899 Other long term (current) drug therapy: Secondary | ICD-10-CM | POA: Diagnosis not present

## 2023-01-20 DIAGNOSIS — Q231 Congenital insufficiency of aortic valve: Secondary | ICD-10-CM | POA: Insufficient documentation

## 2023-01-20 MED ORDER — DENOSUMAB 60 MG/ML ~~LOC~~ SOSY
60.0000 mg | PREFILLED_SYRINGE | Freq: Once | SUBCUTANEOUS | Status: AC
  Administered 2023-01-20: 60 mg via SUBCUTANEOUS

## 2023-01-20 NOTE — Patient Instructions (Signed)
Medication Instructions:  Your physician recommends that you continue on your current medications as directed. Please refer to the Current Medication list given to you today.  *If you need a refill on your cardiac medications before your next appointment, please call your pharmacy*   Lab Work: Your physician recommends that you return for lab work in: Today   If you have labs (blood work) drawn today and your tests are completely normal, you will receive your results only by: MyChart Message (if you have MyChart) OR A paper copy in the mail If you have any lab test that is abnormal or we need to change your treatment, we will call you to review the results.   Testing/Procedures: NONE   Follow-Up: At [facility], you and your health needs are our priority.  As part of our continuing mission to provide you with exceptional heart care, we have created designated Provider Care Teams.  These Care Teams include your primary Cardiologist (physician) and Advanced Practice Providers (APPs -  Physician Assistants and Nurse Practitioners) who all work together to provide you with the care you need, when you need it.  We recommend signing up for the patient portal called "MyChart".  Sign up information is provided on this After Visit Summary.  MyChart is used to connect with patients for Virtual Visits (Telemedicine).  Patients are able to view lab/test results, encounter notes, upcoming appointments, etc.  Non-urgent messages can be sent to your provider as well.   To learn more about what you can do with MyChart, go to [email or web address].    Your next appointment:    February   Provider:   Dietrich Pates, MD    Other Instructions Thank you for choosing [facility]!

## 2023-01-20 NOTE — Progress Notes (Signed)
Cardiology Office Note   Date:  01/20/2023   ID:  Monique Warren, DOB 02-12-1951, MRN [id number]  PCP:  Anabel Halon, MD  Cardiologist:   Dietrich Pates, MD   Pt presents for follow up of HTN   History of Present Illness: Monique Warren is a 72 y.o. female with a history of HTN, cerebral aneurysm, s/p clip, asthma, syncope (remote) OSA(on CPAP),  GERD, esophagitis, T2DM and CP   Echo in Jan 2023 showed LVH   Vigorous LV functoin with near cavity obliteration during systolice   Recomm adding diltiazem 120 then 240  Cut back on lisinopril  I saw the pt in March 2023   Coronary CT showed no CAD with 0 Ca score   MRI in May 2023 showed asymmetric septal hypoertrophy with SAM  Did not meet criteria for HCM.   Bicuspid AV          She was seen by Alyson Ingles in Sept 2023  for CP    Seen in GI   Hx of GERD  CP not felt to be cardiac  Since seen she says she has intermittent episodes of CP   Not associated with activity   She attrib to reflux    Breathing is OK    Current Meds  Medication Sig   acetaminophen (TYLENOL) 500 MG tablet Take 1 tablet (500 mg total) by mouth every 6 (six) hours as needed for moderate pain.   AIMOVIG 140 MG/ML SOAJ INJECT 140 MG UNDER THE SKIN EVERY 30 DAYS   AIRDUO DIGIHALER 232-14 MCG/ACT AEPB USE 1 INHALATION TWICE A DAY (Patient taking differently: Inhale 2 puffs into the lungs 2 (two) times daily.)   albuterol (VENTOLIN HFA) 108 (90 Base) MCG/ACT inhaler USE 2 INHALATIONS EVERY 6 HOURS AS NEEDED FOR WHEEZING OR SHORTNESS OF BREATH   aspirin EC 325 MG tablet Take 1 tablet (325 mg total) by mouth daily with breakfast.   atorvastatin (LIPITOR) 40 MG tablet Take 1 tablet (40 mg total) by mouth at bedtime.   Cholecalciferol (VITAMIN D3) 125 MCG (5000 UT) capsule Take 5,000 Units by mouth daily.   diltiazem (CARDIZEM SR) 120 MG 12 hr capsule TAKE 2 CAPSULES DAILY   docusate sodium (COLACE) 100 MG capsule Take 1 capsule (100 mg total) by mouth 2 (two) times  daily.   DULoxetine (CYMBALTA) 60 MG capsule TAKE 1 CAPSULE DAILY (Patient taking differently: Take 60 mg by mouth at bedtime.)   Estradiol 10 MCG TABS vaginal tablet Place 10 mcg vaginally every Monday.   famotidine (PEPCID) 40 MG tablet TAKE 1 TABLET DAILY (Patient taking differently: Take 40 mg by mouth at bedtime.)   gabapentin (NEURONTIN) 300 MG capsule Take 1 capsule (300 mg total) by mouth at bedtime. (Patient taking differently: Take 300 mg by mouth at bedtime as needed (pain).)   Lancets (ONETOUCH DELICA PLUS LANCET33G) MISC Use to check glucose once daily   LINZESS 72 MCG capsule TAKE 1 CAPSULE DAILY BEFORE BREAKFAST   meclizine (ANTIVERT) 12.5 MG tablet Take 1 tablet (12.5 mg total) by mouth 3 (three) times daily as needed for dizziness.   methocarbamol (ROBAXIN) 500 MG tablet Take 1 tablet (500 mg total) by mouth every 6 (six) hours as needed for muscle spasms.   metoprolol succinate (TOPROL-XL) 50 MG 24 hr tablet 1 tablet (25 mg total) daily. Take with or immediately following a meal..   Multiple Vitamin (MULTIVITAMIN WITH MINERALS) TABS tablet Take 1 tablet by mouth daily.  ONETOUCH ULTRA test strip Use to monitor glucose once daily   polyethylene glycol (MIRALAX / GLYCOLAX) 17 g packet Take 17 g by mouth daily as needed for mild constipation.   PROLIA 60 MG/ML SOSY injection INJECT 60 MG UNDER THE SKIN EVERY 6 MONTHS   promethazine (PHENERGAN) 12.5 MG tablet Take 1 tablet (12.5 mg total) by mouth every 6 (six) hours as needed for nausea or vomiting.   RABEprazole (ACIPHEX) 20 MG tablet TAKE 1 TABLET DAILY BEFORE BREAKFAST   Semaglutide, 2 MG/DOSE, 8 MG/3ML SOPN Inject 2 mg as directed once a week.   traMADol (ULTRAM) 50 MG tablet Take 1 tablet (50 mg total) by mouth every 6 (six) hours.   venlafaxine (EFFEXOR) 37.5 MG tablet Take 1 tablet (37.5 mg total) by mouth 2 (two) times daily.   zolmitriptan (ZOMIG) 5 MG tablet Take 5 mg by mouth daily as needed for migraine.   Current  Facility-Administered Medications for the 01/20/23 encounter (Office Visit) with Pricilla Riffle, MD  Medication   bupivacaine-meloxicam ER (ZYNRELEF) injection 400 mg   bupivacaine-meloxicam ER (ZYNRELEF) injection 400 mg     Allergies:   Cefuroxime, Codeine, Sulfa antibiotics, Sulfamethoxazole-trimethoprim, and Gluten meal   Past Medical History:  Diagnosis Date   Anxiety    Arthritis    Asthma    Celiac disease    Depression    Diabetes mellitus, type II (HCC)    Diabetic neuropathy (HCC)    Diastolic dysfunction    Grade 1 with preserved EF   Dysrhythmia    GERD (gastroesophageal reflux disease)    Heart murmur    Hiatal hernia    Sliding   Hyperlipidemia    Hypertension    Migraine    Obstructive sleep apnea    Osteoporosis    Vertigo     Past Surgical History:  Procedure Laterality Date   ABDOMINAL HYSTERECTOMY  09/29/2003   APPENDECTOMY     BALLOON DILATION N/A 02/08/2022   Procedure: BALLOON DILATION;  Surgeon: Lanelle Bal, DO;  Location: [facility];  Service: Endoscopy;  Laterality: N/A;   BIOPSY  02/08/2022   Procedure: BIOPSY;  Surgeon: Lanelle Bal, DO;  Location: [facility];  Service: Endoscopy;;   BREAST BIOPSY Left    CARPAL TUNNEL RELEASE     CATARACT EXTRACTION Bilateral    October and November 2016   COLONOSCOPY  09/2017   Blake Divine, MD in [place]; 5 mm tubular adenoma in the descending colon s/p resected, mild sigmoid diverticulosis, internal hemorrhoids.  Recommended repeat colonoscopy in 5 years.   CRANIOTOMY FOR ANEURYSM / VERTEBROBASILAR / CAROTID CIRCULATION Right 10/24/2015   ESOPHAGOGASTRODUODENOSCOPY  09/2017   [name]; irregular Z-line s/p biopsy, decreased LES tone, 4 cm hiatal hernia, erythema in gastric antrum biopsied, normal examined duodenum biopsied.  Esophageal biopsy suggestive of reflux, no Barrett's, gastric biopsy with nonspecific chronic gastritis with intestinal metaplasia, duodenal biopsy with increased  intraepithelial lymphocytes without blunting of villi, nonspecific.   ESOPHAGOGASTRODUODENOSCOPY (EGD) WITH PROPOFOL N/A 02/08/2022   Surgeon: Lanelle Bal, DO; 2 cm hiatal hernia, short segment Barrett's esophagus without dysplasia, mild Schatzki's ring dilated, gastritis with biopsies benign, normal examined duodenum.  Repeat EGD in 5 years.   KNEE ARTHROSCOPY WITH MEDIAL MENISECTOMY Left 04/09/2022   Procedure: KNEE ARTHROSCOPY WITH PARTIAL MEDIAL MENISCECTOMY, LATERAL MENISCAL DEBRIDEMENT;  Surgeon: Vickki Hearing, MD;  Location: [facility];  Service: Orthopedics;  Laterality: Left;   KNEE ARTHROSCOPY WITH MENISCAL REPAIR Left 04/09/2022  Procedure: KNEE ARTHROSCOPY WITH MEDIAL MENISCAL REPAIR;  Surgeon: Vickki Hearing, MD;  Location: [facility];  Service: Orthopedics;  Laterality: Left;   LAPAROSCOPY     TOTAL KNEE ARTHROPLASTY Right 12/06/2022   Procedure: TOTAL KNEE ARTHROPLASTY;  Surgeon: Vickki Hearing, MD;  Location: [facility];  Service: Orthopedics;  Laterality: Right;   UTERINE FIBROID SURGERY     WRIST SURGERY Bilateral      Social History:  The patient  reports that she has quit smoking. Her smoking use included cigarettes. She smoked an average of .5 packs per day. She has never used smokeless tobacco. She reports current alcohol use of about 4.0 standard drinks of alcohol per week. She reports that she does not use drugs.   Family History:  The patient's family history includes Breast cancer in her mother and paternal grandmother; Heart attack in her father; Heart failure in her father; Hyperlipidemia in her father; Hypertension in her father; Stroke in her mother; Thyroid disease in her mother.    ROS:  Please see the history of present illness. All other systems are reviewed and  Negative to the above problem except as noted.    PHYSICAL EXAM: VS:  BP 136/84   Pulse 84   Ht 5\' 2"  (1.575 m)   Wt 182 lb 9.6 oz (82.8 kg)   SpO2 99%   BMI 33.40 kg/m   WRU:EAVWU  72 yo in no acute distress  HEENT: normal  Neck: no JVD, no bruit Cardiac: RRR; II/Vi systolic murmur LSB   No LE edema  Respiratory:  CTA GI: soft, nontender   No hepatomegaly   EKG:  EKG is not ordered today.   MRI  May 2023  FINDINGS: 1. Small to normal left ventricular size, with LVEDD 45 mm, and LVEDVi 59 mL/m2.   Asymmetric left ventricular remodeling, with intraventricular septal thickness of 14 mm, posterior wall thickness of 8 mm, and myocardial mass index of 55 g/m2.   Hyperdynamic left ventricular systolic function (LVEF =69%). There is systolic anterior of the mitral chordae tendineae, qualitatively there is LVOT obstruction.   Left ventricular parametric mapping notable for normal T2 signal and normal ECV signal.   There is no late gadolinium enhancement in the left ventricular myocardium.   2.  Small to normal right ventricular size with RVEDVI 46 mL/m2.   Normal right ventricular thickness.   Normal right ventricular systolic function (RVEF 62%=). There are no regional wall motion abnormalities or aneurysms.   3.  Normal left and right atrial size.   4. Normal size of the aortic root, ascending aorta and pulmonary artery.   5. Valve assessment:   Aortic Valve: Siever's Zero Bicuspid Valve. Mean gradient 2 mm Hg. Regurgitant fraction 2 %. There is no significant regurgitation or stenosis.   Pulmonic Valve: Mean gradient < 1 mm Hg. Regurgitant fraction 1 %. There is no significant regurgitation or stenosis.   Tricuspid Valve: Qualitatively, there is mild, central regurgitation.   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 Regurgitant volume 17 mL. Regurgitant fraction 24%. Mild to moderate central mitral regurgitation.   6.  Normal pericardium.  No pericardial effusion.   7. Grossly, no extracardiac findings. Recommended dedicated study if concerned for non-cardiac pathology.   8.  Wrap Around artifact noted.    IMPRESSION: Unless patient has family history of hypertrophic cardiomyopathy, diagnostic criteria for HCM not met.   Siever's Zero Bicuspid Valve without significant aortopathy, stenosis, or regurgitation.  Mild to moderate mitral regurgitation.    CT coronary angiogram   March 2023  Calcium Score: No calcium noted   Coronary Arteries: Right dominant with no anomalies   LM: Normal side by side LCX/LAD ostia   IM: Small vessel normal   LAD: Normal   D1: Normal   D2: Normal   D3: Normal   Circumflex: Normal   OM1: Normal   OM2: Normal   RCA: Normal   PDA: Normal   PLA: Normal   IMPRESSION: 1. Calcium score 0   2.  CAD RADS 0 normal right dominant coronary arteries   3.  Normal ascending thoracic aorta 3.3 cm    Echo  10/11/21  Left ventricular ejection fraction, by estimation, is 70 to 75%. The left ventricle has hyperdynamic function. The left ventricle has no regional wall motion abnormalities. There is moderate concentric left ventricular hypertrophy. Left ventricular diastolic parameters are consistent with Grade I diastolic dysfunction (impaired relaxation). Elevated left ventricular end-diastolic pressure. There is a mildly increased LVOT gradient that is stable with Valsalva, also some degree of mitral chordal SAM based on limited views. 1. Right ventricular systolic function is normal. The right ventricular size is normal. Tricuspid regurgitation signal is inadequate for assessing PA pressure. 2. 3. The mitral valve is grossly normal. Trivial mitral valve regurgitation. The aortic valve was not well visualized. There is mild calcification of the aortic valve. Aortic valve regurgitation is not visualized. Aortic valve mean gradient measures 10.0 mmHg. Suspect LVOT gradient more a contributor than any significant degree of aortic stenosis. 4. 5. Unable to estimate CVP. Comparison(s): No prior Echocardiogram.   Lipid Panel    Component  Value Date/Time   CHOL 246 (H) 06/05/2022 1142   TRIG 165 (H) 06/05/2022 1142   HDL 41 06/05/2022 1142   CHOLHDL 6.0 (H) 06/05/2022 1142   LDLCALC 174 (H) 06/05/2022 1142      Wt Readings from Last 3 Encounters:  01/20/23 182 lb 9.6 oz (82.8 kg)  01/01/23 181 lb 9.6 oz (82.4 kg)  12/06/22 198 lb 6.6 oz (90 kg)      ASSESSMENT AND PLAN:   1  Chest pain  Atypical   Most likely GI    WOrk up last year showed no CAD     2  HOCM physiology.   Vigorous LV function with SAM     No severe obstruction   Stay hydrated   3  Bicuspid AV    No significant gradient    Will need to be followed.    4  HTN   BP is mildly increased   Follow    5  HL   Repeat lipomed    6 DM  Watch Carbs    A1C 6.1      Follow up in Feb    Current medicines are reviewed at length with the patient today.  The patient does not have concerns regarding medicines.  Signed, Dietrich Pates, MD  01/20/2023 8:03 PM    [facility] [address] [place], [place], [place]  [address] Phone: [phone]; Fax: [phone]

## 2023-01-21 LAB — MISC LABCORP TEST (SEND OUT): Labcorp test code: 884247

## 2023-01-22 ENCOUNTER — Encounter (HOSPITAL_COMMUNITY): Payer: 59

## 2023-01-22 LAB — LIPOPROTEIN A (LPA): Lipoprotein (a): 11.6 nmol/L (ref <75.0)

## 2023-01-22 LAB — MISC LABCORP TEST (SEND OUT): Labcorp test code: 167015

## 2023-01-23 MED ORDER — ONETOUCH ULTRA VI STRP
ORAL_STRIP | 3 refills | Status: AC
Start: 2023-01-23 — End: ?

## 2023-01-23 MED ORDER — ONETOUCH DELICA PLUS LANCET33G MISC
3 refills | Status: AC
Start: 2023-01-23 — End: ?

## 2023-01-24 ENCOUNTER — Ambulatory Visit (HOSPITAL_COMMUNITY): Payer: 59

## 2023-01-24 DIAGNOSIS — M25661 Stiffness of right knee, not elsewhere classified: Secondary | ICD-10-CM

## 2023-01-24 DIAGNOSIS — R262 Difficulty in walking, not elsewhere classified: Secondary | ICD-10-CM

## 2023-01-24 DIAGNOSIS — M25561 Pain in right knee: Secondary | ICD-10-CM

## 2023-01-24 NOTE — Therapy (Signed)
OUTPATIENT PHYSICAL THERAPY LOWER EXTREMITY EVALUATION   Patient Name: Monique Warren MRN: [id number] DOB:09-09-1951, 72 y.o., female Today's Date: 01/24/2023  END OF SESSION:  PT End of Session - 01/24/23 1131     Visit Number 4    Number of Visits 8    Date for PT Re-Evaluation 02/07/23    Authorization Type [employer]    PT Start Time 1130   late check in   PT Stop Time 1200    PT Time Calculation (min) 30 min    Activity Tolerance Patient tolerated treatment well    Behavior During Therapy WFL for tasks assessed/performed               Past Medical History:  Diagnosis Date   Anxiety    Arthritis    Asthma    Celiac disease    Depression    Diabetes mellitus, type II (HCC)    Diabetic neuropathy (HCC)    Diastolic dysfunction    Grade 1 with preserved EF   Dysrhythmia    GERD (gastroesophageal reflux disease)    Heart murmur    Hiatal hernia    Sliding   Hyperlipidemia    Hypertension    Migraine    Obstructive sleep apnea    Osteoporosis    Vertigo    Past Surgical History:  Procedure Laterality Date   ABDOMINAL HYSTERECTOMY  09/29/2003   APPENDECTOMY     BALLOON DILATION N/A 02/08/2022   Procedure: BALLOON DILATION;  Surgeon: Lanelle Bal, DO;  [facility]: [facility];  Service: Endoscopy;  Laterality: N/A;   BIOPSY  02/08/2022   Procedure: BIOPSY;  Surgeon: Lanelle Bal, DO;  [facility]: [facility];  Service: Endoscopy;;   BREAST BIOPSY Left    CARPAL TUNNEL RELEASE     CATARACT EXTRACTION Bilateral    October and November 2016   COLONOSCOPY  09/2017   Blake Divine, MD in [place]; 5 mm tubular adenoma in the descending colon s/p resected, mild sigmoid diverticulosis, internal hemorrhoids.  Recommended repeat colonoscopy in 5 years.   CRANIOTOMY FOR ANEURYSM / VERTEBROBASILAR / CAROTID CIRCULATION Right 10/24/2015   ESOPHAGOGASTRODUODENOSCOPY  09/2017   [name]; irregular Z-line s/p biopsy, decreased LES tone, 4 cm hiatal hernia,  erythema in gastric antrum biopsied, normal examined duodenum biopsied.  Esophageal biopsy suggestive of reflux, no Barrett's, gastric biopsy with nonspecific chronic gastritis with intestinal metaplasia, duodenal biopsy with increased intraepithelial lymphocytes without blunting of villi, nonspecific.   ESOPHAGOGASTRODUODENOSCOPY (EGD) WITH PROPOFOL N/A 02/08/2022   Surgeon: Lanelle Bal, DO; 2 cm hiatal hernia, short segment Barrett's esophagus without dysplasia, mild Schatzki's ring dilated, gastritis with biopsies benign, normal examined duodenum.  Repeat EGD in 5 years.   KNEE ARTHROSCOPY WITH MEDIAL MENISECTOMY Left 04/09/2022   Procedure: KNEE ARTHROSCOPY WITH PARTIAL MEDIAL MENISCECTOMY, LATERAL MENISCAL DEBRIDEMENT;  Surgeon: Vickki Hearing, MD;  Location: [facility];  Service: Orthopedics;  Laterality: Left;   KNEE ARTHROSCOPY WITH MENISCAL REPAIR Left 04/09/2022   Procedure: KNEE ARTHROSCOPY WITH MEDIAL MENISCAL REPAIR;  Surgeon: Vickki Hearing, MD;  Location: [facility];  Service: Orthopedics;  Laterality: Left;   LAPAROSCOPY     TOTAL KNEE ARTHROPLASTY Right 12/06/2022   Procedure: TOTAL KNEE ARTHROPLASTY;  Surgeon: Vickki Hearing, MD;  Location: [facility];  Service: Orthopedics;  Laterality: Right;   UTERINE FIBROID SURGERY     WRIST SURGERY Bilateral    Patient Active Problem List   Diagnosis Date Noted   Primary osteoarthritis  of right knee 12/06/2022   Status post total right knee replacement 12/06/2022   Chronic fatigue 10/02/2022   Acute pain of right shoulder 07/05/2022   S/P left knee arthroscopy 04/09/22 04/15/2022   Acute medial meniscus tear of left knee    Tear of lateral meniscus of left knee    Encounter for general adult medical examination with abnormal findings 04/01/2022   Dysphagia 01/16/2022   Gastroesophageal reflux disease 01/16/2022   Barrett's esophagus without dysplasia 01/16/2022   Chronic idiopathic constipation 01/16/2022   Elevated LFTs  01/16/2022   Fatty liver 01/16/2022   History of colonic polyps 01/16/2022   Constipation 10/02/2021   Bilateral thumb pain 08/14/2021   Chest pain 07/02/2021   Hernia of abdominal cavity 06/12/2021   Vertigo 06/12/2021   Carpal tunnel syndrome of right wrist 05/16/2021   Polyneuropathy due to type 2 diabetes mellitus (HCC) 05/16/2021   MDD (major depressive disorder), recurrent episode (HCC) 04/19/2021   HTN (hypertension) 04/19/2021   BPPV (benign paroxysmal positional vertigo) 09/25/2019   Celiac disease 09/25/2019   Obstructive sleep apnea 09/25/2019   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   Hyperlipidemia 10/16/2015   Arthritis 09/27/2015   Asthma 09/27/2015   Migraine without aura or status migrainosus 09/27/2015   Other intervertebral disc degeneration, lumbosacral region 01/26/2014   Neck pain 12/31/2013   Osteoporosis 09/16/2008   Type 2 diabetes mellitus with diabetic neuropathy, unspecified (HCC) 09/16/2008   Sliding hiatal hernia 11-10-1950    PCP: Trena Platt; MD  REFERRING PROVIDER: Vickki Hearing, MD  REFERRING DIAG: M17.11 (ICD-10-CM) - Primary osteoarthritis of right knee  THERAPY DIAG:  Stiffness of right knee, not elsewhere classified - Plan: PT plan of care cert/re-cert  Difficulty in walking, not elsewhere classified - Plan: PT plan of care cert/re-cert  Pain of right knee after injury - Plan: PT plan of care cert/re-cert  Rationale for Evaluation and Treatment: Rehabilitation  ONSET DATE: 12/06/2022  SUBJECTIVE:   SUBJECTIVE STATEMENT: Patient states she overslept this morning; forgot my walker in the driveway.  "Stiff " this morning 3/10 pain today   Eval:  Trouble with the knees for long time; " I have a balance problem"; when woke up from surgery had severe vertigo which limited her ability to attend outpatient therapy.  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 Yes: NPRS scale: 3/10 Pain location: right knee Pain description: sore Aggravating factors: sitting too long Relieving factors: exercise, ice  PRECAUTIONS: Fall  WEIGHT BEARING RESTRICTIONS: No  FALLS:  Has patient fallen in last 6 months? Yes. Number of falls several  LIVING ENVIRONMENT: Lives with: lives with their spouse Lives in: House/apartment Stairs: Yes: External: 4 steps; on left going up Has following equipment at home: Walker - 4 wheeled  OCCUPATION: retjred  PLOF: Independent  PATIENT GOALS: walking by myself and feeling safe; being able to work in [employer] by myself; driving  NEXT MD VISIT: today  OBJECTIVE:   DIAGNOSTIC FINDINGS:   PATIENT SURVEYS:  LEFS 11/80  COGNITION: Overall cognitive status: Within functional limits for tasks assessed     SENSATION: WFL  EDEMA:  Minimal swelling noted; does have some bruising right shin  POSTURE: rounded shoulders and forward head  PALPATION: Tenderness right knee anterior and posterior  LOWER EXTREMITY ROM:  Active ROM Right eval Left eval Right 01/17/23  Hip flexion  Hip extension     Hip abduction     Hip adduction     Hip internal rotation     Hip external rotation     Knee flexion 112  110  Knee extension -13  10  Ankle dorsiflexion     Ankle plantarflexion     Ankle inversion     Ankle eversion      (Blank rows = not tested)  LOWER EXTREMITY MMT:  MMT Right eval Left eval  Hip flexion 4-   Hip extension    Hip abduction    Hip adduction    Hip internal rotation    Hip external rotation    Knee flexion 3+ isometric in sitting   Knee extension 3-   Ankle dorsiflexion 4-   Ankle plantarflexion    Ankle inversion    Ankle eversion     (Blank rows = not tested)   FUNCTIONAL TESTS:  5 times sit to stand: 17.97 sec using arms to push up to stand 2 minute walk test: 202 with rollator  GAIT: Distance walked: 202 ft Assistive  device utilized: Environmental consultant - 4 wheeled Level of assistance: SBA Comments: slower than normal gait speed   TODAY'S TREATMENT:                                                                                                                              DATE:  01/24/23 Standing: Heel/toe raises x 20 Knee drives for flexion on 12" box x 2' Hamstring stretch on 6" box x 2' TKE with RTB x 5 (had to d/c due to dizziness)  Seated: 2# LAQs 2 x 10 Hamstring stretch with strap 5 x 20' Extension stretch x 2'      01/17/23:   Rec bike seat 8 x 3 min  Standing:  heel raise 10x 2 Toe raise 10x  Slant board 2x 30" TKE 10x 5" with YTB  Seated: LAQ with DF 10x 3" holds  Supine: quad sets 10x 5" Heel slide 10x AROM 10-110   01/13/23:   Reviewed goals Educated importance of HEP compliance for maximal benefits  Prone knee hang 2' Seated with heel prop for extension x3 min  Supine: Quad sets - encouraged to complete 100 a day. SAQ 10x 5" Hamstring stretch 3x 30" 1 with hands behind knee; 2 with rope    01/10/2023 physical therapy evaluation and HEP    PATIENT EDUCATION:  Education details: Patient educated on exam findings, POC, scope of PT, HEP, and what to expect next visit. Person educated: Patient Education method: Explanation, Demonstration, and Handouts Education comprehension: verbalized understanding, returned demonstration, verbal cues required, and tactile cues required   HOME EXERCISE PROGRAM: Access Code: [email or web address] URL: [email or web address] Date: 01/10/2023 Prepared by: [facility]  Exercises - Supine Quad Set  - 2 x daily - 7 x weekly - 1 sets - 10 reps - 5 sec hold - Supine Knee Extension Stretch on Towel  Roll  - 2 x daily - 7 x weekly - 1 sets - 1 reps - 5 min hold - Seated Passive Knee Extension  - 2 x daily - 7 x weekly - 1 sets - 1 reps - 5 min hold  01/13/23:  Prone knee hang  ASSESSMENT:  CLINICAL IMPRESSION: Late arrival today. Forgot  her walker in a rush to get here.  She is able to walk without AD but gait is slow and antalgic.   Patient with complaints of intermittent dizziness throughout today and had to discontinue standing exercises for safety purposes. Patient continues with extension lag.  Needs assist to walk out at the end of treatment today.  Patient will benefit from continued skilled therapy services to address deficits and promote return to optimal function.      Patient is a 72 y.o. female who was seen today for physical therapy evaluation and treatment for M17.11 (ICD-10-CM) - Primary osteoarthritis of right knee. Patient  presents to physical therapy with complaint of Right knee pain and stiffness; also having ongoing issues with vertigo. Patient demonstrates muscle weakness, reduced ROM, and fascial restrictions which are likely contributing to symptoms of pain and are negatively impacting patient ability to perform ADLs and functional mobility tasks. Patient will benefit from skilled physical therapy services to address these deficits to reduce pain and improve level of function with ADLs and functional mobility tasks.   OBJECTIVE IMPAIRMENTS: Abnormal gait, decreased activity tolerance, decreased balance, decreased endurance, decreased mobility, difficulty walking, decreased ROM, decreased strength, dizziness, hypomobility, increased fascial restrictions, impaired perceived functional ability, impaired flexibility, and pain.   ACTIVITY LIMITATIONS: carrying, lifting, bending, sitting, standing, squatting, sleeping, stairs, transfers, bed mobility, bathing, toileting, dressing, locomotion level, and caring for others  PARTICIPATION LIMITATIONS: meal prep, cleaning, laundry, driving, shopping, and community activity  PERSONAL FACTORS:  vertigo  are also affecting patient's functional outcome.   REHAB POTENTIAL: Good  CLINICAL DECISION MAKING: Stable/uncomplicated  EVALUATION COMPLEXITY:  Moderate   GOALS: Goals reviewed with patient? No  SHORT TERM GOALS: Target date: 01/24/2023 patient will be independent with initial HEP   Baseline: Goal status: IN PROGRESS  2.  Patient will improve right knee extension to -5 to increase stance phase time on the right leg with ambulation Baseline: -12 Goal status: IN PROGRESS    LONG TERM GOALS: Target date: 02/07/2023  Patient will be independent in self management strategies to improve quality of life and functional outcomes.  Baseline:  Goal status: IN PROGRESS  2.  Patient will increase right leg MMTs to 4+-5/5 without pain to promote return to ambulation community distances with minimal deviation.  Baseline: see above Goal status: IN PROGRESS  3.  Patient will self report 50% improvement to improve tolerance for functional activity   Baseline:  Goal status: IN PROGRESS  4.  Patient will increased knee mobility to -2 to 120 to promote normal navigation of steps; step over step pattern  Baseline:  Goal status: IN PROGRESS  5.  Patient will increase distance on to 250 ft  to demonstrate improved functional mobility walking household and community distances.   Baseline: 202 ft with rollator Goal status: IN PROGRESS   PLAN:  PT FREQUENCY: 2x/week  PT DURATION: 4 weeks  PLANNED INTERVENTIONS: Therapeutic exercises, Therapeutic activity, Neuromuscular re-education, Balance training, Gait training, Patient/Family education, Joint manipulation, Joint mobilization, Stair training, Orthotic/Fit training, DME instructions, Aquatic Therapy, Dry Needling, Electrical stimulation, Spinal manipulation, Spinal mobilization, Cryotherapy, Moist heat, Compression bandaging, scar mobilization,  Splintting, Taping, Traction, Ultrasound, Ionotophoresis 4mg /ml Dexamethasone, and Manual therapy   PLAN FOR NEXT SESSION: patient unable to lie completely flat due to vertigo; review HEP and goals; progress right knee mobility  and strength as able  12:00 PM, 01/24/23 Chance Karam Small Beniah Magnan MPT [facility] [place] [phone] [phone]

## 2023-01-29 ENCOUNTER — Ambulatory Visit (HOSPITAL_COMMUNITY): Payer: 59 | Admitting: Physical Therapy

## 2023-01-29 DIAGNOSIS — M25661 Stiffness of right knee, not elsewhere classified: Secondary | ICD-10-CM

## 2023-01-29 DIAGNOSIS — M25641 Stiffness of right hand, not elsewhere classified: Secondary | ICD-10-CM

## 2023-01-29 DIAGNOSIS — R262 Difficulty in walking, not elsewhere classified: Secondary | ICD-10-CM

## 2023-01-29 DIAGNOSIS — M25541 Pain in joints of right hand: Secondary | ICD-10-CM

## 2023-01-29 DIAGNOSIS — M25561 Pain in right knee: Secondary | ICD-10-CM

## 2023-01-29 NOTE — Therapy (Signed)
OUTPATIENT PHYSICAL THERAPY TREATMENT   Patient Name: CORENNA FORNSHELL MRN: [id number] DOB:[birth year], 72 y.o., female Today's Date: 01/29/2023  END OF SESSION:  PT End of Session - 01/29/23 1447     Visit Number 5    Number of Visits 8    Date for PT Re-Evaluation 02/07/23    Authorization Type [employer]    PT Start Time 1445    PT Stop Time 1515    PT Time Calculation (min) 30 min    Activity Tolerance Patient tolerated treatment well    Behavior During Therapy WFL for tasks assessed/performed               Past Medical History:  Diagnosis Date   Anxiety    Arthritis    Asthma    Celiac disease    Depression    Diabetes mellitus, type II (HCC)    Diabetic neuropathy (HCC)    Diastolic dysfunction    Grade 1 with preserved EF   Dysrhythmia    GERD (gastroesophageal reflux disease)    Heart murmur    Hiatal hernia    Sliding   Hyperlipidemia    Hypertension    Migraine    Obstructive sleep apnea    Osteoporosis    Vertigo    Past Surgical History:  Procedure Laterality Date   ABDOMINAL HYSTERECTOMY  09/29/2003   APPENDECTOMY     BALLOON DILATION N/A 02/08/2022   Procedure: BALLOON DILATION;  Surgeon: Lanelle Bal, DO;  Location: [facility];  Service: Endoscopy;  Laterality: N/A;   BIOPSY  02/08/2022   Procedure: BIOPSY;  Surgeon: Lanelle Bal, DO;  Location: [facility];  Service: Endoscopy;;   BREAST BIOPSY Left    CARPAL TUNNEL RELEASE     CATARACT EXTRACTION Bilateral    October and November 2016   COLONOSCOPY  09/2017   Blake Divine, MD in [place]; 5 mm tubular adenoma in the descending colon s/p resected, mild sigmoid diverticulosis, internal hemorrhoids.  Recommended repeat colonoscopy in 5 years.   CRANIOTOMY FOR ANEURYSM / VERTEBROBASILAR / CAROTID CIRCULATION Right 10/24/2015   ESOPHAGOGASTRODUODENOSCOPY  09/2017   [name]; irregular Z-line s/p biopsy, decreased LES tone, 4 cm hiatal hernia, erythema in gastric antrum biopsied,  normal examined duodenum biopsied.  Esophageal biopsy suggestive of reflux, no Barrett's, gastric biopsy with nonspecific chronic gastritis with intestinal metaplasia, duodenal biopsy with increased intraepithelial lymphocytes without blunting of villi, nonspecific.   ESOPHAGOGASTRODUODENOSCOPY (EGD) WITH PROPOFOL N/A 02/08/2022   Surgeon: Lanelle Bal, DO; 2 cm hiatal hernia, short segment Barrett's esophagus without dysplasia, mild Schatzki's ring dilated, gastritis with biopsies benign, normal examined duodenum.  Repeat EGD in 5 years.   KNEE ARTHROSCOPY WITH MEDIAL MENISECTOMY Left 04/09/2022   Procedure: KNEE ARTHROSCOPY WITH PARTIAL MEDIAL MENISCECTOMY, LATERAL MENISCAL DEBRIDEMENT;  Surgeon: Vickki Hearing, MD;  Location: [facility];  Service: Orthopedics;  Laterality: Left;   KNEE ARTHROSCOPY WITH MENISCAL REPAIR Left 04/09/2022   Procedure: KNEE ARTHROSCOPY WITH MEDIAL MENISCAL REPAIR;  Surgeon: Vickki Hearing, MD;  Location: [facility];  Service: Orthopedics;  Laterality: Left;   LAPAROSCOPY     TOTAL KNEE ARTHROPLASTY Right 12/06/2022   Procedure: TOTAL KNEE ARTHROPLASTY;  Surgeon: Vickki Hearing, MD;  Location: [facility];  Service: Orthopedics;  Laterality: Right;   UTERINE FIBROID SURGERY     WRIST SURGERY Bilateral    Patient Active Problem List   Diagnosis Date Noted   Primary osteoarthritis of right knee 12/06/2022  Status post total right knee replacement 12/06/2022   Chronic fatigue 10/02/2022   Acute pain of right shoulder 07/05/2022   S/P left knee arthroscopy 04/09/22 04/15/2022   Acute medial meniscus tear of left knee    Tear of lateral meniscus of left knee    Encounter for general adult medical examination with abnormal findings 04/01/2022   Dysphagia 01/16/2022   Gastroesophageal reflux disease 01/16/2022   Barrett's esophagus without dysplasia 01/16/2022   Chronic idiopathic constipation 01/16/2022   Elevated LFTs 01/16/2022   Fatty liver 01/16/2022    History of colonic polyps 01/16/2022   Constipation 10/02/2021   Bilateral thumb pain 08/14/2021   Chest pain 07/02/2021   Hernia of abdominal cavity 06/12/2021   Vertigo 06/12/2021   Carpal tunnel syndrome of right wrist 05/16/2021   Polyneuropathy due to type 2 diabetes mellitus (HCC) 05/16/2021   MDD (major depressive disorder), recurrent episode (HCC) 04/19/2021   HTN (hypertension) 04/19/2021   BPPV (benign paroxysmal positional vertigo) 09/25/2019   Celiac disease 09/25/2019   Obstructive sleep apnea 09/25/2019   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   Hyperlipidemia 10/16/2015   Arthritis 09/27/2015   Asthma 09/27/2015   Migraine without aura or status migrainosus 09/27/2015   Other intervertebral disc degeneration, lumbosacral region 01/26/2014   Neck pain 12/31/2013   Osteoporosis 09/16/2008   Type 2 diabetes mellitus with diabetic neuropathy, unspecified (HCC) 09/16/2008   Sliding hiatal hernia [birth year]    PCP: Trena Platt; MD  REFERRING PROVIDER: Vickki Hearing, MD  REFERRING DIAG: M17.11 (ICD-10-CM) - Primary osteoarthritis of right knee  THERAPY DIAG:  Stiffness of right knee, not elsewhere classified  Difficulty in walking, not elsewhere classified  Pain of right knee after injury  Pain in joint of right hand  Stiffness of right hand, not elsewhere classified  Rationale for Evaluation and Treatment: Rehabilitation  ONSET DATE: 12/06/2022  SUBJECTIVE:   SUBJECTIVE STATEMENT: Patient states her Rt leg gets tired.  Tried to walk some without her walker she is is more stiff and her Rt LE will swell. 2/10 pain today, more of a soreness than pain.  Pt reports she uses the lift chair at home.   Eval:  Trouble with the knees for long time; " I have a balance problem"; when woke up from surgery had severe vertigo which limited her ability to attend outpatient therapy.  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 Yes: NPRS scale: 3/10 Pain location: right knee Pain description: sore Aggravating factors: sitting too long Relieving factors: exercise, ice  PRECAUTIONS: Fall  WEIGHT BEARING RESTRICTIONS: No  FALLS:  Has patient fallen in last 6 months? Yes. Number of falls several  LIVING ENVIRONMENT: Lives with: lives with their spouse Lives in: House/apartment Stairs: Yes: External: 4 steps; on left going up Has following equipment at home: Walker - 4 wheeled  OCCUPATION: retjred  PLOF: Independent  PATIENT GOALS: walking by myself and feeling safe; being able to work in [employer] by myself; driving  NEXT MD VISIT: today  OBJECTIVE:   DIAGNOSTIC FINDINGS:   PATIENT SURVEYS:  LEFS 11/80  COGNITION: Overall cognitive status: Within functional limits for tasks assessed     SENSATION: WFL  EDEMA:  Minimal swelling noted; does have some bruising right shin  POSTURE: rounded shoulders and forward head  PALPATION: Tenderness right knee anterior and posterior  LOWER EXTREMITY ROM:  Active ROM  Right eval Left eval Right 01/17/23 Right 01/29/23  Hip flexion      Hip extension      Hip abduction      Hip adduction      Hip internal rotation      Hip external rotation      Knee flexion 112  110 110  Knee extension -13  10 -15  Ankle dorsiflexion      Ankle plantarflexion      Ankle inversion      Ankle eversion       (Blank rows = not tested)  LOWER EXTREMITY MMT:  MMT Right eval Left eval  Hip flexion 4-   Hip extension    Hip abduction    Hip adduction    Hip internal rotation    Hip external rotation    Knee flexion 3+ isometric in sitting   Knee extension 3-   Ankle dorsiflexion 4-   Ankle plantarflexion    Ankle inversion    Ankle eversion     (Blank rows = not tested)   FUNCTIONAL TESTS:  5 times sit to stand: 17.97 sec using arms to push up to stand 2 minute  walk test: 202 with rollator  GAIT: Distance walked: 202 ft Assistive device utilized: Environmental consultant - 4 wheeled Level of assistance: SBA Comments: slower than normal gait speed   TODAY'S TREATMENT:                                                                                                                              DATE:  01/29/23 Standing:  heelraises 20X  Toeraises 20X  12" step Rt hamstring stretch with OP 3X20"   Slant board 3X30"   Knee drives for flexion on 12" box 10X10"  Rt hamstring curls 10X Ambulation using SPC step through AROM seated -15 to 110  01/24/23 Standing: Heel/toe raises x 20 Knee drives for flexion on 12" box x 2' Hamstring stretch on 6" box x 2' TKE with RTB x 5 (had to d/c due to dizziness)  Seated: 2# LAQs 2 x 10 Hamstring stretch with strap 5 x 20' Extension stretch x 2'      01/17/23:   Rec bike seat 8 x 3 min  Standing:  heel raise 10x 2 Toe raise 10x  Slant board 2x 30" TKE 10x 5" with YTB  Seated: LAQ with DF 10x 3" holds  Supine: quad sets 10x 5" Heel slide 10x AROM 10-110   01/13/23:   Reviewed goals Educated importance of HEP compliance for maximal benefits  Prone knee hang 2' Seated with heel prop for extension x3 min  Supine: Quad sets - encouraged to complete 100 a day. SAQ 10x 5" Hamstring stretch 3x 30" 1 with hands behind knee; 2 with rope    01/10/2023 physical therapy evaluation and HEP    PATIENT EDUCATION:  Education details: Patient educated on exam findings, POC, scope of PT, HEP, and what  to expect next visit. Person educated: Patient Education method: Explanation, Demonstration, and Handouts Education comprehension: verbalized understanding, returned demonstration, verbal cues required, and tactile cues required   HOME EXERCISE PROGRAM: Access Code: [email or web address] URL: [email or web address] Date: 01/10/2023 Prepared by: [facility]  Exercises - Supine Quad Set  - 2 x daily - 7 x weekly  - 1 sets - 10 reps - 5 sec hold - Supine Knee Extension Stretch on Towel Roll  - 2 x daily - 7 x weekly - 1 sets - 1 reps - 5 min hold - Seated Passive Knee Extension  - 2 x daily - 7 x weekly - 1 sets - 1 reps - 5 min hold  01/13/23:  Prone knee hang  ASSESSMENT:  CLINICAL IMPRESSION: Late arrival today. Contiued with focus on Rt LE mobilty and strength.  No dizziness or pain reported during or at conclusion of session today. Instructed with gait using SPC today with overall good cadence and stability.  Able to increase reps today without rest breaks.  Treatment was limited by late arrival.  AROM -15 to 110 degrees today.  Encouraged to work more on this at home and to discontinue use of lift chair.   Patient will benefit from continued skilled therapy services to address deficits and promote return to optimal function.      Patient is a 72 y.o. female who was seen today for physical therapy evaluation and treatment for M17.11 (ICD-10-CM) - Primary osteoarthritis of right knee. Patient  presents to physical therapy with complaint of Right knee pain and stiffness; also having ongoing issues with vertigo. Patient demonstrates muscle weakness, reduced ROM, and fascial restrictions which are likely contributing to symptoms of pain and are negatively impacting patient ability to perform ADLs and functional mobility tasks. Patient will benefit from skilled physical therapy services to address these deficits to reduce pain and improve level of function with ADLs and functional mobility tasks.   OBJECTIVE IMPAIRMENTS: Abnormal gait, decreased activity tolerance, decreased balance, decreased endurance, decreased mobility, difficulty walking, decreased ROM, decreased strength, dizziness, hypomobility, increased fascial restrictions, impaired perceived functional ability, impaired flexibility, and pain.   ACTIVITY LIMITATIONS: carrying, lifting, bending, sitting, standing, squatting, sleeping, stairs,  transfers, bed mobility, bathing, toileting, dressing, locomotion level, and caring for others  PARTICIPATION LIMITATIONS: meal prep, cleaning, laundry, driving, shopping, and community activity  PERSONAL FACTORS:  vertigo  are also affecting patient's functional outcome.   REHAB POTENTIAL: Good  CLINICAL DECISION MAKING: Stable/uncomplicated  EVALUATION COMPLEXITY: Moderate   GOALS: Goals reviewed with patient? No  SHORT TERM GOALS: Target date: 01/24/2023 patient will be independent with initial HEP   Baseline: Goal status: IN PROGRESS  2.  Patient will improve right knee extension to -5 to increase stance phase time on the right leg with ambulation Baseline: -12 Goal status: IN PROGRESS    LONG TERM GOALS: Target date: 02/07/2023  Patient will be independent in self management strategies to improve quality of life and functional outcomes.  Baseline:  Goal status: IN PROGRESS  2.  Patient will increase right leg MMTs to 4+-5/5 without pain to promote return to ambulation community distances with minimal deviation.  Baseline: see above Goal status: IN PROGRESS  3.  Patient will self report 50% improvement to improve tolerance for functional activity   Baseline:  Goal status: IN PROGRESS  4.  Patient will increased knee mobility to -2 to 120 to promote normal navigation of steps; step over step pattern  Baseline:  Goal status:  IN PROGRESS  5.  Patient will increase distance on to 250 ft  to demonstrate improved functional mobility walking household and community distances.   Baseline: 202 ft with rollator Goal status: IN PROGRESS   PLAN:  PT FREQUENCY: 2x/week  PT DURATION: 4 weeks  PLANNED INTERVENTIONS: Therapeutic exercises, Therapeutic activity, Neuromuscular re-education, Balance training, Gait training, Patient/Family education, Joint manipulation, Joint mobilization, Stair training, Orthotic/Fit training, DME instructions, Aquatic Therapy,  Dry Needling, Electrical stimulation, Spinal manipulation, Spinal mobilization, Cryotherapy, Moist heat, Compression bandaging, scar mobilization, Splintting, Taping, Traction, Ultrasound, Ionotophoresis 4mg /ml Dexamethasone, and Manual therapy   PLAN FOR NEXT SESSION: continue to progress towards goals.  patient unable to lie completely flat due to vertigo    Lurena Nida, PTA/CLT [facility] Ph: [phone]  4:31 PM, 01/29/23

## 2023-02-05 ENCOUNTER — Telehealth (HOSPITAL_COMMUNITY): Payer: Self-pay | Admitting: Physical Therapy

## 2023-02-05 ENCOUNTER — Encounter (HOSPITAL_COMMUNITY): Payer: Medicare Other | Admitting: Physical Therapy

## 2023-02-05 NOTE — Telephone Encounter (Signed)
Pt did not show for worked in appt today.  Called and spoke to patient who reported she fell asleep.  Pt then cancelled her next appt due to being out of town next week.  Lurena Nida, PTA/CLT [facility] Ph: [phone]

## 2023-02-12 ENCOUNTER — Encounter (HOSPITAL_COMMUNITY): Payer: Medicare Other

## 2023-02-14 ENCOUNTER — Encounter (HOSPITAL_COMMUNITY): Payer: Medicare Other

## 2023-02-18 ENCOUNTER — Ambulatory Visit (HOSPITAL_COMMUNITY): Payer: Medicare HMO | Attending: Orthopedic Surgery

## 2023-02-18 ENCOUNTER — Encounter (HOSPITAL_COMMUNITY): Payer: Self-pay

## 2023-02-18 DIAGNOSIS — R262 Difficulty in walking, not elsewhere classified: Secondary | ICD-10-CM | POA: Insufficient documentation

## 2023-02-18 DIAGNOSIS — M25661 Stiffness of right knee, not elsewhere classified: Secondary | ICD-10-CM | POA: Diagnosis not present

## 2023-02-18 DIAGNOSIS — M25561 Pain in right knee: Secondary | ICD-10-CM | POA: Diagnosis not present

## 2023-02-18 NOTE — Therapy (Addendum)
OUTPATIENT PHYSICAL THERAPY TREATMENT  Progress Note Reporting Period 01/10/23 to 02/18/23  See note below for Objective Data and Assessment of Progress/Goals.     Patient Name: Monique Warren MRN: [id number] DOB:09-26-1950, 72 y.o., female Today's Date: 02/19/2023  END OF SESSION:  PT End of Session - 02/18/23 1254     Visit Number 6    Number of Visits 8    Date for PT Re-Evaluation 03/07/23    Authorization Type [employer]    Authorization Time Period Cert dates: 86/57-->8/46/96    PT Start Time 1140   late sign in   PT Stop Time 1220    PT Time Calculation (min) 40 min    Activity Tolerance Patient tolerated treatment well    Behavior During Therapy [facility] for tasks assessed/performed                Past Medical History:  Diagnosis Date   Anxiety    Arthritis    Asthma    Celiac disease    Depression    Diabetes mellitus, type II (HCC)    Diabetic neuropathy (HCC)    Diastolic dysfunction    Grade 1 with preserved EF   Dysrhythmia    GERD (gastroesophageal reflux disease)    Heart murmur    Hiatal hernia    Sliding   Hyperlipidemia    Hypertension    Migraine    Obstructive sleep apnea    Osteoporosis    Vertigo    Past Surgical History:  Procedure Laterality Date   ABDOMINAL HYSTERECTOMY  09/29/2003   APPENDECTOMY     BALLOON DILATION N/A 02/08/2022   Procedure: BALLOON DILATION;  Surgeon: Lanelle Bal, DO;  Location: [facility];  Service: Endoscopy;  Laterality: N/A;   BIOPSY  02/08/2022   Procedure: BIOPSY;  Surgeon: Lanelle Bal, DO;  Location: [facility];  Service: Endoscopy;;   BREAST BIOPSY Left    CARPAL TUNNEL RELEASE     CATARACT EXTRACTION Bilateral    October and November 2016   COLONOSCOPY  09/2017   Blake Divine, MD in [place]; 5 mm tubular adenoma in the descending colon s/p resected, mild sigmoid diverticulosis, internal hemorrhoids.  Recommended repeat colonoscopy in 5 years.   CRANIOTOMY FOR ANEURYSM /  VERTEBROBASILAR / CAROTID CIRCULATION Right 10/24/2015   ESOPHAGOGASTRODUODENOSCOPY  09/2017   [name]; irregular Z-line s/p biopsy, decreased LES tone, 4 cm hiatal hernia, erythema in gastric antrum biopsied, normal examined duodenum biopsied.  Esophageal biopsy suggestive of reflux, no Barrett's, gastric biopsy with nonspecific chronic gastritis with intestinal metaplasia, duodenal biopsy with increased intraepithelial lymphocytes without blunting of villi, nonspecific.   ESOPHAGOGASTRODUODENOSCOPY (EGD) WITH PROPOFOL N/A 02/08/2022   Surgeon: Lanelle Bal, DO; 2 cm hiatal hernia, short segment Barrett's esophagus without dysplasia, mild Schatzki's ring dilated, gastritis with biopsies benign, normal examined duodenum.  Repeat EGD in 5 years.   KNEE ARTHROSCOPY WITH MEDIAL MENISECTOMY Left 04/09/2022   Procedure: KNEE ARTHROSCOPY WITH PARTIAL MEDIAL MENISCECTOMY, LATERAL MENISCAL DEBRIDEMENT;  Surgeon: Vickki Hearing, MD;  Location: [facility];  Service: Orthopedics;  Laterality: Left;   KNEE ARTHROSCOPY WITH MENISCAL REPAIR Left 04/09/2022   Procedure: KNEE ARTHROSCOPY WITH MEDIAL MENISCAL REPAIR;  Surgeon: Vickki Hearing, MD;  Location: [facility];  Service: Orthopedics;  Laterality: Left;   LAPAROSCOPY     TOTAL KNEE ARTHROPLASTY Right 12/06/2022   Procedure: TOTAL KNEE ARTHROPLASTY;  Surgeon: Vickki Hearing, MD;  Location: [facility];  Service: Orthopedics;  Laterality:  Right;   UTERINE FIBROID SURGERY     WRIST SURGERY Bilateral    Patient Active Problem List   Diagnosis Date Noted   Primary osteoarthritis of right knee 12/06/2022   Status post total right knee replacement 12/06/2022   Chronic fatigue 10/02/2022   Acute pain of right shoulder 07/05/2022   S/P left knee arthroscopy 04/09/22 04/15/2022   Acute medial meniscus tear of left knee    Tear of lateral meniscus of left knee    Encounter for general adult medical examination with abnormal findings 04/01/2022   Dysphagia  01/16/2022   Gastroesophageal reflux disease 01/16/2022   Barrett's esophagus without dysplasia 01/16/2022   Chronic idiopathic constipation 01/16/2022   Elevated LFTs 01/16/2022   Fatty liver 01/16/2022   History of colonic polyps 01/16/2022   Constipation 10/02/2021   Bilateral thumb pain 08/14/2021   Chest pain 07/02/2021   Hernia of abdominal cavity 06/12/2021   Vertigo 06/12/2021   Carpal tunnel syndrome of right wrist 05/16/2021   Polyneuropathy due to type 2 diabetes mellitus (HCC) 05/16/2021   MDD (major depressive disorder), recurrent episode (HCC) 04/19/2021   HTN (hypertension) 04/19/2021   BPPV (benign paroxysmal positional vertigo) 09/25/2019   Celiac disease 09/25/2019   Obstructive sleep apnea 09/25/2019   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   Hyperlipidemia 10/16/2015   Arthritis 09/27/2015   Asthma 09/27/2015   Migraine without aura or status migrainosus 09/27/2015   Other intervertebral disc degeneration, lumbosacral region 01/26/2014   Neck pain 12/31/2013   Osteoporosis 09/16/2008   Type 2 diabetes mellitus with diabetic neuropathy, unspecified (HCC) 09/16/2008   Sliding hiatal hernia 11/07/51    PCP: Trena Platt; MD  REFERRING PROVIDER: Vickki Hearing, MD  REFERRING DIAG: M17.11 (ICD-10-CM) - Primary osteoarthritis of right knee  THERAPY DIAG:  Stiffness of right knee, not elsewhere classified  Difficulty in walking, not elsewhere classified  Pain of right knee after injury  Rationale for Evaluation and Treatment: Rehabilitation  ONSET DATE: 12/06/2022  SUBJECTIVE:   SUBJECTIVE STATEMENT: Pt reports 2 falls last week landing on knees.  Arrived walking with rollator.  No reports of pain currently.  Has apt with Dr Romeo Apple on Friday.  Eval:  Trouble with the knees for long time; " I have a balance problem"; when woke up from surgery had severe vertigo which limited her ability to attend outpatient therapy.  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 Yes: NPRS scale: 3/10 Pain location: right knee Pain description: sore Aggravating factors: sitting too long Relieving factors: exercise, ice  PRECAUTIONS: Fall  WEIGHT BEARING RESTRICTIONS: No  FALLS:  Has patient fallen in last 6 months? Yes. Number of falls several  LIVING ENVIRONMENT: Lives with: lives with their spouse Lives in: House/apartment Stairs: Yes: External: 4 steps; on left going up Has following equipment at home: Walker - 4 wheeled  OCCUPATION: retjred  PLOF: Independent  PATIENT GOALS: walking by myself and feeling safe; being able to work in [employer] by myself; driving  NEXT MD VISIT: 09/21/08  OBJECTIVE:   DIAGNOSTIC FINDINGS:   PATIENT SURVEYS:  LEFS 11/80  COGNITION: Overall cognitive status: Within functional limits for tasks assessed     SENSATION: WFL  EDEMA:  Minimal swelling noted; does have some bruising right shin  POSTURE: rounded shoulders and forward head  PALPATION: Tenderness right knee anterior and posterior  LOWER EXTREMITY ROM:  Active ROM Right eval Left eval Right 01/17/23 Right 01/29/23 Right 02/18/23  Hip flexion       Hip extension       Hip abduction       Hip adduction       Hip internal rotation       Hip external rotation       Knee flexion 112  110 110 120  Knee extension -13  10 -15 9  Ankle dorsiflexion       Ankle plantarflexion       Ankle inversion       Ankle eversion        (Blank rows = not tested)  LOWER EXTREMITY MMT:  MMT Right eval Left eval Right  02/18/23: Left 02/18/23  Hip flexion 4-  4+/5 extension lag 4+  Hip extension   Prone 4/5 Prone 4/5  Hip abduction   4+ 4+  Hip adduction      Hip internal rotation      Hip external rotation      Knee flexion 3+ isometric in sitting  4/5 prone   Knee extension 3-  3-/5   Ankle dorsiflexion 4-     Ankle  plantarflexion      Ankle inversion      Ankle eversion       (Blank rows = not tested)   FUNCTIONAL TESTS:  5 times sit to stand: 17.97 sec using arms to push up to stand 2 minute walk test: 202 with rollator  02/18/23: 5 STS: 15.10" hands on thigh : 352 with rollator  GAIT: Distance walked: 202 ft Assistive device utilized: Environmental consultant - 4 wheeled Level of assistance: SBA Comments: slower than normal gait speed   TODAY'S TREATMENT:                                                                                                                              DATE:  02/18/23: 352 with rollator 5RT stairs: 1st: lateral, 2nd: step to; 3-5RT reciprocal pattern with cueing to reduce ER  5STS 15.10" MMT see above ROM measurement 9-120 TKE GTB 10x 10" TKE with ball against wall- HEP 10x  01/29/23 Standing:  heelraises 20X  Toeraises 20X  12" step Rt hamstring stretch with OP 3X20"   Slant board 3X30"   Knee drives for flexion on 12" box 10X10"  Rt hamstring curls 10X Ambulation using SPC step through AROM seated -15 to 110  01/24/23 Standing: Heel/toe raises x 20 Knee drives for flexion on 12" box x 2' Hamstring stretch on 6" box x 2' TKE with RTB x 5 (had to d/c due to dizziness)  Seated: 2# LAQs 2 x 10 Hamstring stretch with strap 5 x 20' Extension stretch x 2'      01/17/23:   Rec bike seat 8 x 3 min  Standing:  heel raise 10x 2 Toe raise 10x  Slant board 2x 30" TKE 10x 5" with  YTB  Seated: LAQ with DF 10x 3" holds  Supine: quad sets 10x 5" Heel slide 10x AROM 10-110   01/13/23:   Reviewed goals Educated importance of HEP compliance for maximal benefits  Prone knee hang 2' Seated with heel prop for extension x3 min  Supine: Quad sets - encouraged to complete 100 a day. SAQ 10x 5" Hamstring stretch 3x 30" 1 with hands behind knee; 2 with rope    01/10/2023 physical therapy evaluation and HEP    PATIENT EDUCATION:  Education details:  Patient educated on exam findings, POC, scope of PT, HEP, and what to expect next visit. Person educated: Patient Education method: Explanation, Demonstration, and Handouts Education comprehension: verbalized understanding, returned demonstration, verbal cues required, and tactile cues required   HOME EXERCISE PROGRAM: Access Code: [email or web address] URL: [email or web address] Date: 01/10/2023 Prepared by: [facility] - [facility]  Exercises - Supine Quad Set  - 2 x daily - 7 x weekly - 1 sets - 10 reps - 5 sec hold - Supine Knee Extension Stretch on Towel Roll  - 2 x daily - 7 x weekly - 1 sets - 1 reps - 5 min hold - Seated Passive Knee Extension  - 2 x daily - 7 x weekly - 1 sets - 1 reps - 5 min hold  01/13/23:  Prone knee hang  02/18/23:  TKE with ball behind knee.  ASSESSMENT:  CLINICAL IMPRESSION: Late arrival today.  Progress note complete this session with the following findings:  Pt reports compliance with HEP, increased cadence with and decreased time required for 5 STSs.  Pt continues to be limited with quad weakness and extension lag with AROM 9-120 degrees.  Reviewed importance of HEP compliance with additional TKE exercise added to home exercise program.  Pt reports she feels 75% improvements.  Pt did reports 2 forward falls last week.  Reviewed sleeping position and encouraged pt to not sleep with pillow under knee.  MD apt on 02/21/23.  Pt will continue to benefit from PT to address goals unmet and to improve knee extension.     Patient is a 72 y.o. female who was seen today for physical therapy evaluation and treatment for M17.11 (ICD-10-CM) - Primary osteoarthritis of right knee. Patient  presents to physical therapy with complaint of Right knee pain and stiffness; also having ongoing issues with vertigo. Patient demonstrates muscle weakness, reduced ROM, and fascial restrictions which are likely contributing to symptoms of pain and are negatively impacting patient ability to  perform ADLs and functional mobility tasks. Patient will benefit from skilled physical therapy services to address these deficits to reduce pain and improve level of function with ADLs and functional mobility tasks.   OBJECTIVE IMPAIRMENTS: Abnormal gait, decreased activity tolerance, decreased balance, decreased endurance, decreased mobility, difficulty walking, decreased ROM, decreased strength, dizziness, hypomobility, increased fascial restrictions, impaired perceived functional ability, impaired flexibility, and pain.   ACTIVITY LIMITATIONS: carrying, lifting, bending, sitting, standing, squatting, sleeping, stairs, transfers, bed mobility, bathing, toileting, dressing, locomotion level, and caring for others  PARTICIPATION LIMITATIONS: meal prep, cleaning, laundry, driving, shopping, and community activity  PERSONAL FACTORS:  vertigo  are also affecting patient's functional outcome.   REHAB POTENTIAL: Good  CLINICAL DECISION MAKING: Stable/uncomplicated  EVALUATION COMPLEXITY: Moderate   GOALS: Goals reviewed with patient? No  SHORT TERM GOALS: Target date: 01/24/2023 patient will be independent with initial HEP Baseline: 02/18/23:  Reports compliance with HEP daily Goal status: IN PROGRESS  2.  Patient will improve right knee extension  to -5 to increase stance phase time on the right leg with ambulation Baseline: -12; 02/18/23: AROM 9-120 Goal status: IN PROGRESS    LONG TERM GOALS: Target date: 02/07/2023  Patient will be independent in self management strategies to improve quality of life and functional outcomes. Baseline:  Goal status: MET  2.  Patient will increase right leg MMTs to 4+-5/5 without pain to promote return to ambulation community distances with minimal deviation. Baseline: see above Goal status: IN PROGRESS  3.  Patient will self report 50% improvement to improve tolerance for functional activity Baseline: 02/18/23: Feels she has improved by 75% Goal  status: IN PROGRESS  4.  Patient will increased knee mobility to -2 to 120 to promote normal navigation of steps; step over step pattern  Baseline: 02/18/23: 9-120 degrees Goal status: IN PROGRESS  5.  Patient will increase distance on to 250 ft  to demonstrate improved functional mobility walking household and community distances.   Baseline: 202 ft with rollator; 02/17/53: 312ft with rollator Goal status: IN PROGRESS   PLAN:  PT FREQUENCY: 2x/week  PT DURATION: 4 weeks  PLANNED INTERVENTIONS: Therapeutic exercises, Therapeutic activity, Neuromuscular re-education, Balance training, Gait training, Patient/Family education, Joint manipulation, Joint mobilization, Stair training, Orthotic/Fit training, DME instructions, Aquatic Therapy, Dry Needling, Electrical stimulation, Spinal manipulation, Spinal mobilization, Cryotherapy, Moist heat, Compression bandaging, scar mobilization, Splintting, Taping, Traction, Ultrasound, Ionotophoresis 4mg /ml Dexamethasone, and Manual therapy   PLAN FOR NEXT SESSION: continue to progress towards goals.  patient unable to lie completely flat due to vertigo.  F/U with MD apt.    Becky Sax, LPTA/CLT; CBIS [phone]  Juel Burrow, PTA 02/19/2023, 3:17 PM

## 2023-02-19 ENCOUNTER — Encounter: Payer: Self-pay | Admitting: Internal Medicine

## 2023-02-20 ENCOUNTER — Encounter (HOSPITAL_COMMUNITY): Payer: Medicare Other

## 2023-02-20 ENCOUNTER — Other Ambulatory Visit: Payer: Self-pay

## 2023-02-20 DIAGNOSIS — F331 Major depressive disorder, recurrent, moderate: Secondary | ICD-10-CM

## 2023-02-20 DIAGNOSIS — E782 Mixed hyperlipidemia: Secondary | ICD-10-CM

## 2023-02-20 DIAGNOSIS — G43019 Migraine without aura, intractable, without status migrainosus: Secondary | ICD-10-CM

## 2023-02-20 DIAGNOSIS — J4540 Moderate persistent asthma, uncomplicated: Secondary | ICD-10-CM

## 2023-02-20 DIAGNOSIS — E1140 Type 2 diabetes mellitus with diabetic neuropathy, unspecified: Secondary | ICD-10-CM

## 2023-02-20 MED ORDER — DULOXETINE HCL 60 MG PO CPEP: 60.0000 mg | ORAL_CAPSULE | Freq: Every day | ORAL | 3 refills | Status: DC

## 2023-02-20 MED ORDER — GABAPENTIN 300 MG PO CAPS: 300.0000 mg | ORAL_CAPSULE | Freq: Every day | ORAL | 3 refills | Status: DC

## 2023-02-20 MED ORDER — FAMOTIDINE 40 MG PO TABS: 40.0000 mg | ORAL_TABLET | Freq: Every day | ORAL | 3 refills | Status: DC

## 2023-02-20 MED ORDER — VENLAFAXINE HCL 37.5 MG PO TABS: 37.5000 mg | ORAL_TABLET | Freq: Two times a day (BID) | ORAL | 3 refills | Status: DC

## 2023-02-20 MED ORDER — ATORVASTATIN CALCIUM 40 MG PO TABS: 40.0000 mg | ORAL_TABLET | Freq: Every day | ORAL | 3 refills | Status: DC

## 2023-02-20 MED ORDER — PROLIA 60 MG/ML ~~LOC~~ SOSY: PREFILLED_SYRINGE | SUBCUTANEOUS | 1 refills | Status: DC

## 2023-02-20 MED ORDER — ALBUTEROL SULFATE HFA 108 (90 BASE) MCG/ACT IN AERS: INHALATION_SPRAY | RESPIRATORY_TRACT | 10 refills | Status: DC

## 2023-02-20 MED ORDER — AIRDUO DIGIHALER 232-14 MCG/ACT IN AEPB: 1.0000 | INHALATION_SPRAY | Freq: Two times a day (BID) | RESPIRATORY_TRACT | 11 refills | Status: AC

## 2023-02-20 MED ORDER — AIMOVIG 140 MG/ML ~~LOC~~ SOAJ: SUBCUTANEOUS | 11 refills | Status: DC

## 2023-02-21 ENCOUNTER — Ambulatory Visit (INDEPENDENT_AMBULATORY_CARE_PROVIDER_SITE_OTHER): Payer: Medicare HMO | Admitting: Orthopedic Surgery

## 2023-02-21 DIAGNOSIS — Z792 Long term (current) use of antibiotics: Secondary | ICD-10-CM

## 2023-02-21 DIAGNOSIS — Z96651 Presence of right artificial knee joint: Secondary | ICD-10-CM

## 2023-02-21 MED ORDER — CLINDAMYCIN HCL 150 MG PO CAPS: ORAL_CAPSULE | ORAL | 1 refills | Status: DC

## 2023-02-21 NOTE — Progress Notes (Signed)
Post op appt  Chief Complaint  Patient presents with   Follow-up    Recheck on right knee, DOS 12-06-22.    RT TKA   2 falls   Knee looks and feels fine   ROM 5-120   I m happy with that   Manipul. Not needed or indicated   I advised her to use the walker (seems to have s/s of spinal stenosis)   Encounter Diagnosis  Name Primary?   S/P total knee replacement, right 12/06/22 Yes   Meds ordered this encounter  Medications   clindamycin (CLEOCIN) 150 MG capsule    Sig: Take 1 - 30 min prior to dental work    Dispense:  5 capsule    Refill:  1   Allergies  Allergen Reactions   Cefuroxime Anaphylaxis, Hives, Shortness Of Breath, Swelling and Anxiety   Codeine Hives, Shortness Of Breath, Nausea And Vomiting, Swelling and Anxiety   Sulfa Antibiotics Anaphylaxis, Hives, Nausea And Vomiting and Swelling    Swollen throat   Sulfamethoxazole-Trimethoprim Shortness Of Breath, Swelling and Anxiety    Throat swelling    Gluten Meal     Severe reflux - celiac disease     Fu in 2 months

## 2023-02-25 ENCOUNTER — Encounter (HOSPITAL_COMMUNITY): Payer: Medicare Other

## 2023-02-27 ENCOUNTER — Encounter (HOSPITAL_COMMUNITY): Payer: Medicare Other

## 2023-03-04 ENCOUNTER — Encounter (HOSPITAL_COMMUNITY): Payer: Medicare Other

## 2023-03-06 ENCOUNTER — Encounter (HOSPITAL_COMMUNITY): Payer: Medicare Other

## 2023-03-10 ENCOUNTER — Other Ambulatory Visit: Payer: Self-pay | Admitting: Nurse Practitioner

## 2023-03-10 ENCOUNTER — Ambulatory Visit: Payer: Medicare HMO | Admitting: Nurse Practitioner

## 2023-03-10 ENCOUNTER — Encounter: Payer: Self-pay | Admitting: Nurse Practitioner

## 2023-03-10 VITALS — BP 114/82 | HR 98 | Ht 62.0 in | Wt 184.4 lb

## 2023-03-10 DIAGNOSIS — Z7985 Long-term (current) use of injectable non-insulin antidiabetic drugs: Secondary | ICD-10-CM | POA: Diagnosis not present

## 2023-03-10 DIAGNOSIS — E559 Vitamin D deficiency, unspecified: Secondary | ICD-10-CM

## 2023-03-10 DIAGNOSIS — I10 Essential (primary) hypertension: Secondary | ICD-10-CM

## 2023-03-10 DIAGNOSIS — E782 Mixed hyperlipidemia: Secondary | ICD-10-CM

## 2023-03-10 DIAGNOSIS — E1140 Type 2 diabetes mellitus with diabetic neuropathy, unspecified: Secondary | ICD-10-CM

## 2023-03-10 LAB — POCT GLYCOSYLATED HEMOGLOBIN (HGB A1C): Hemoglobin A1C: 5.7 % — AB (ref 4.0–5.6)

## 2023-03-10 MED ORDER — SEMAGLUTIDE (2 MG/DOSE) 8 MG/3ML ~~LOC~~ SOPN: 2.0000 mg | PEN_INJECTOR | SUBCUTANEOUS | 3 refills | Status: DC

## 2023-03-10 MED ORDER — ACCU-CHEK GUIDE ME W/DEVICE KIT: PACK | 0 refills | Status: DC

## 2023-03-10 NOTE — Progress Notes (Signed)
Endocrinology Follow Up Note       03/10/2023, 2:31 PM   Subjective:    Patient ID: Monique Warren, female    DOB: [birth year].  Monique Warren is being seen in follow up after being seen in consultation for management of currently uncontrolled symptomatic diabetes requested by  Anabel Halon, MD.   Past Medical History:  Diagnosis Date   Anxiety    Arthritis    Asthma    Celiac disease    Depression    Diabetes mellitus, type II (HCC)    Diabetic neuropathy (HCC)    Diastolic dysfunction    Grade 1 with preserved EF   Dysrhythmia    GERD (gastroesophageal reflux disease)    Heart murmur    Hiatal hernia    Sliding   Hyperlipidemia    Hypertension    Migraine    Obstructive sleep apnea    Osteoporosis    Vertigo     Past Surgical History:  Procedure Laterality Date   ABDOMINAL HYSTERECTOMY  09/29/2003   APPENDECTOMY     BALLOON DILATION N/A 02/08/2022   Procedure: BALLOON DILATION;  Surgeon: Lanelle Bal, DO;  [facility]: [facility];  Service: Endoscopy;  Laterality: N/A;   BIOPSY  02/08/2022   Procedure: BIOPSY;  Surgeon: Lanelle Bal, DO;  Location: [facility];  Service: Endoscopy;;   BREAST BIOPSY Left    CARPAL TUNNEL RELEASE     CATARACT EXTRACTION Bilateral    October and November 2016   COLONOSCOPY  09/2017   Blake Divine, MD in [place]; 5 mm tubular adenoma in the descending colon s/p resected, mild sigmoid diverticulosis, internal hemorrhoids.  Recommended repeat colonoscopy in 5 years.   CRANIOTOMY FOR ANEURYSM / VERTEBROBASILAR / CAROTID CIRCULATION Right 10/24/2015   ESOPHAGOGASTRODUODENOSCOPY  09/2017   [name]; irregular Z-line s/p biopsy, decreased LES tone, 4 cm hiatal hernia, erythema in gastric antrum biopsied, normal examined duodenum biopsied.  Esophageal biopsy suggestive of reflux, no Barrett's, gastric biopsy with nonspecific chronic gastritis with  intestinal metaplasia, duodenal biopsy with increased intraepithelial lymphocytes without blunting of villi, nonspecific.   ESOPHAGOGASTRODUODENOSCOPY (EGD) WITH PROPOFOL N/A 02/08/2022   Surgeon: Lanelle Bal, DO; 2 cm hiatal hernia, short segment Barrett's esophagus without dysplasia, mild Schatzki's ring dilated, gastritis with biopsies benign, normal examined duodenum.  Repeat EGD in 5 years.   KNEE ARTHROSCOPY WITH MEDIAL MENISECTOMY Left 04/09/2022   Procedure: KNEE ARTHROSCOPY WITH PARTIAL MEDIAL MENISCECTOMY, LATERAL MENISCAL DEBRIDEMENT;  Surgeon: Vickki Hearing, MD;  Location: [facility];  Service: Orthopedics;  Laterality: Left;   KNEE ARTHROSCOPY WITH MENISCAL REPAIR Left 04/09/2022   Procedure: KNEE ARTHROSCOPY WITH MEDIAL MENISCAL REPAIR;  Surgeon: Vickki Hearing, MD;  Location: [facility];  Service: Orthopedics;  Laterality: Left;   LAPAROSCOPY     TOTAL KNEE ARTHROPLASTY Right 12/06/2022   Procedure: TOTAL KNEE ARTHROPLASTY;  Surgeon: Vickki Hearing, MD;  Location: [facility];  Service: Orthopedics;  Laterality: Right;   UTERINE FIBROID SURGERY     WRIST SURGERY Bilateral     Social History   Socioeconomic History   Marital status: Married    Spouse name: Not on file  Number of children: Not on file   Years of education: Not on file   Highest education level: GED or equivalent  Occupational History   Not on file  Tobacco Use   Smoking status: Former    Packs/Warren: .5    Types: Cigarettes   Smokeless tobacco: Never  Vaping Use   Vaping Use: Never used  Substance and Sexual Activity   Alcohol use: Yes    Alcohol/week: 4.0 standard drinks of alcohol    Types: 2 Glasses of wine, 2 Shots of liquor per week    Comment: rarely   Drug use: Never   Sexual activity: Not on file  Other Topics Concern   Not on file  Social History Narrative   Not on file   Social Determinants of Health   Financial Resource Strain: Low Risk  (01/01/2023)   Overall Financial  Resource Strain (CARDIA)    Difficulty of Paying Living Expenses: Not hard at all  Food Insecurity: No Food Insecurity (01/01/2023)   Hunger Vital Sign    Worried About Running Out of Food in the Last Year: Never true    Ran Out of Food in the Last Year: Never true  Transportation Needs: No Transportation Needs (01/01/2023)   PRAPARE - Administrator, Civil Service (Medical): No    Lack of Transportation (Non-Medical): No  Physical Activity: Unknown (01/01/2023)   Exercise Vital Sign    Days of Exercise per Week: 0 days    Minutes of Exercise per Session: Not on file  Stress: Stress Concern Present (01/01/2023)   [employer] of Occupational Health - Occupational Stress Questionnaire    Feeling of Stress : To some extent  Social Connections: Moderately Integrated (01/01/2023)   Social Connection and Isolation Panel [NHANES]    Frequency of Communication with Friends and Family: More than three times a week    Frequency of Social Gatherings with Friends and Family: More than three times a week    Attends Religious Services: 1 to 4 times per year    Active Member of [employer] or Organizations: No    Attends Engineer, structural: Not on file    Marital Status: Married    Family History  Problem Relation Age of Onset   Breast cancer Mother    Thyroid disease Mother    Stroke Mother    Hypertension Father    Hyperlipidemia Father    Heart attack Father    Heart failure Father    Breast cancer Paternal Grandmother    Cancer - Colon Neg Hx    Gastric cancer Neg Hx    Esophageal cancer Neg Hx    Liver cancer Neg Hx    Autoimmune disease Neg Hx     Outpatient Encounter Medications as of 03/10/2023  Medication Sig   acetaminophen (TYLENOL) 500 MG tablet Take 1 tablet (500 mg total) by mouth every 6 (six) hours as needed for moderate pain.   AIMOVIG 140 MG/ML SOAJ INJECT 140 MG UNDER THE SKIN EVERY 30 DAYS   albuterol (VENTOLIN HFA) 108 (90 Base) MCG/ACT inhaler  USE 2 INHALATIONS EVERY 6 HOURS AS NEEDED FOR WHEEZING OR SHORTNESS OF BREATH   aspirin EC 325 MG tablet Take 1 tablet (325 mg total) by mouth daily with breakfast.   atorvastatin (LIPITOR) 40 MG tablet Take 1 tablet (40 mg total) by mouth at bedtime.   Blood Glucose Monitoring Suppl (ACCU-CHEK GUIDE ME) w/Device KIT Use to check glucose once daily.  Cholecalciferol (VITAMIN D3) 125 MCG (5000 UT) capsule Take 5,000 Units by mouth daily.   clindamycin (CLEOCIN) 150 MG capsule Take 1 - 30 min prior to dental work   diltiazem (CARDIZEM SR) 120 MG 12 hr capsule TAKE 2 CAPSULES DAILY   docusate sodium (COLACE) 100 MG capsule Take 1 capsule (100 mg total) by mouth 2 (two) times daily.   DULoxetine (CYMBALTA) 60 MG capsule Take 1 capsule (60 mg total) by mouth daily.   Estradiol 10 MCG TABS vaginal tablet Place 10 mcg vaginally every Monday.   famotidine (PEPCID) 40 MG tablet Take 1 tablet (40 mg total) by mouth daily.   Fluticasone-Salmeterol,sensor, (AIRDUO DIGIHALER) 232-14 MCG/ACT AEPB Inhale 1 Inhalation into the lungs 2 (two) times daily. USE 1 INHALATION TWICE A Warren Strength: 232-14 MCG/ACT   gabapentin (NEURONTIN) 300 MG capsule Take 1 capsule (300 mg total) by mouth at bedtime.   Lancets (ONETOUCH DELICA PLUS LANCET33G) MISC Use to check glucose once daily   LINZESS 72 MCG capsule TAKE 1 CAPSULE DAILY BEFORE BREAKFAST   meclizine (ANTIVERT) 12.5 MG tablet Take 1 tablet (12.5 mg total) by mouth 3 (three) times daily as needed for dizziness.   methocarbamol (ROBAXIN) 500 MG tablet Take 1 tablet (500 mg total) by mouth every 6 (six) hours as needed for muscle spasms.   metoprolol succinate (TOPROL-XL) 50 MG 24 hr tablet 1 tablet (25 mg total) daily. Take with or immediately following a meal..   Multiple Vitamin (MULTIVITAMIN WITH MINERALS) TABS tablet Take 1 tablet by mouth daily.   ONETOUCH ULTRA test strip Use to monitor glucose once daily   polyethylene glycol (MIRALAX / GLYCOLAX) 17 g packet  Take 17 g by mouth daily as needed for mild constipation.   PROLIA 60 MG/ML SOSY injection INJECT 60 MG UNDER THE SKIN EVERY 6 MONTHS   promethazine (PHENERGAN) 12.5 MG tablet Take 1 tablet (12.5 mg total) by mouth every 6 (six) hours as needed for nausea or vomiting.   RABEprazole (ACIPHEX) 20 MG tablet TAKE 1 TABLET DAILY BEFORE BREAKFAST   traMADol (ULTRAM) 50 MG tablet Take 1 tablet (50 mg total) by mouth every 6 (six) hours.   venlafaxine (EFFEXOR) 37.5 MG tablet Take 1 tablet (37.5 mg total) by mouth 2 (two) times daily.   zolmitriptan (ZOMIG) 5 MG tablet Take 5 mg by mouth daily as needed for migraine.   [DISCONTINUED] Semaglutide, 2 MG/DOSE, 8 MG/3ML SOPN Inject 2 mg as directed once a week.   Semaglutide, 2 MG/DOSE, 8 MG/3ML SOPN Inject 2 mg as directed once a week.   Facility-Administered Encounter Medications as of 03/10/2023  Medication   bupivacaine-meloxicam ER (ZYNRELEF) injection 400 mg   bupivacaine-meloxicam ER (ZYNRELEF) injection 400 mg    ALLERGIES: Allergies  Allergen Reactions   Cefuroxime Anaphylaxis, Hives, Shortness Of Breath, Swelling and Anxiety   Codeine Hives, Shortness Of Breath, Nausea And Vomiting, Swelling and Anxiety   Sulfa Antibiotics Anaphylaxis, Hives, Nausea And Vomiting and Swelling    Swollen throat   Sulfamethoxazole-Trimethoprim Shortness Of Breath, Swelling and Anxiety    Throat swelling    Gluten Meal     Severe reflux - celiac disease    VACCINATION STATUS: Immunization History  Administered Date(s) Administered   Fluad Quad(high Dose 65+) 07/02/2021, 06/05/2022   Influenza Split 07/26/2014   Influenza, High Dose Seasonal PF 09/22/2018   Influenza-Unspecified 07/26/2014   PFIZER(Purple Top)SARS-COV-2 Vaccination 12/01/2019, 12/29/2019   Pneumococcal Conjugate-13 08/02/2019   Pneumococcal Polysaccharide-23 07/28/2020   Tdap 09/30/2016  Zoster Recombinat (Shingrix) 06/03/2018, 11/10/2019    Diabetes She presents for her  follow-up diabetic visit. She has type 2 diabetes mellitus. Onset time: Diagnosed at approx age of 68. Her disease course has been stable. There are no hypoglycemic associated symptoms. Associated symptoms include chest pain (ongoing work up with cardiology) and foot paresthesias. Pertinent negatives for diabetes include no polyuria and no weight loss. There are no hypoglycemic complications. Symptoms are stable. Diabetic complications include nephropathy and peripheral neuropathy. Risk factors for coronary artery disease include diabetes mellitus, dyslipidemia, family history, hypertension, obesity, post-menopausal and sedentary lifestyle. Current diabetic treatments: Ozempic. She is compliant with treatment all of the time. Her weight is fluctuating minimally. She is following a generally healthy diet. When asked about meal planning, she reported none. She has not had a previous visit with a dietitian. She participates in exercise intermittently. Her home blood glucose trend is fluctuating minimally. Her breakfast blood glucose range is generally 130-140 mg/dl. (She presents today with her meter and logs showing mostly at target fasting glycemic profile.  Her POCT A1c today is 5.7%, improving from last visit of 6.1%.  She denies any hypoglycemia.) An ACE inhibitor/angiotensin II receptor blocker is being taken. She does not see a podiatrist.Eye exam is current (has eye exam later today).  Hyperlipidemia This is a chronic problem. The current episode started more than 1 year ago. The problem is uncontrolled. Recent lipid tests were reviewed and are high. Exacerbating diseases include chronic renal disease, diabetes and obesity. Factors aggravating her hyperlipidemia include fatty foods. Associated symptoms include chest pain (ongoing work up with cardiology). Current antihyperlipidemic treatment includes statins. The current treatment provides moderate improvement of lipids. There are no compliance problems.   Risk factors for coronary artery disease include diabetes mellitus, dyslipidemia, family history, obesity, hypertension, post-menopausal and a sedentary lifestyle.  Hypertension This is a chronic problem. The current episode started more than 1 year ago. The problem has been gradually improving since onset. The problem is controlled. Associated symptoms include chest pain (ongoing work up with cardiology). There are no associated agents to hypertension. Risk factors for coronary artery disease include diabetes mellitus, dyslipidemia, family history, obesity, post-menopausal state and sedentary lifestyle. Past treatments include ACE inhibitors. The current treatment provides moderate improvement. There are no compliance problems.  Hypertensive end-organ damage includes kidney disease. Identifiable causes of hypertension include chronic renal disease and sleep apnea.     Review of systems  Constitutional: + minimally fluctuating body weight, current Body mass index is 33.73 kg/m., no fatigue, no subjective hyperthermia, no subjective hypothermia Eyes: no blurry vision, no xerophthalmia ENT: no sore throat, no nodules palpated in throat, no dysphagia/odynophagia, no hoarseness Cardiovascular: no chest pain, no shortness of breath, no palpitations, no leg swelling Respiratory: no cough, no shortness of breath Gastrointestinal: no nausea/vomiting/diarrhea Musculoskeletal: no muscle/joint aches Skin: no rashes, no hyperemia Neurological: no tremors, + numbness/tingling to BLE, no dizziness Psychiatric: no depression, no anxiety  Objective:     BP 114/82 (BP Location: Left Arm, Patient Position: Sitting, Cuff Size: Large)   Pulse 98   Ht 5\' 2"  (1.575 m)   Wt 184 lb 6.4 oz (83.6 kg)   BMI 33.73 kg/m   Wt Readings from Last 3 Encounters:  03/10/23 184 lb 6.4 oz (83.6 kg)  01/20/23 182 lb 9.6 oz (82.8 kg)  01/01/23 181 lb 9.6 oz (82.4 kg)     BP Readings from Last 3 Encounters:  03/10/23  114/82  01/20/23 136/84  01/01/23 Marland Kitchen)  87/65     Physical Exam- Limited  Constitutional:  Body mass index is 33.73 kg/m. , not in acute distress, normal state of mind Eyes:  EOMI, no exophthalmos Musculoskeletal: no gross deformities, strength intact in all four extremities, no gross restriction of joint movements Skin:  no rashes, no hyperemia Neurological: no tremor with outstretched hands     Diabetic Foot Exam - Simple   No data filed     CMP ( most recent) CMP     Component Value Date/Time   NA 136 12/07/2022 0557   NA 142 06/05/2022 1142   K 4.3 12/07/2022 0557   CL 107 12/07/2022 0557   CO2 21 (L) 12/07/2022 0557   GLUCOSE 181 (H) 12/07/2022 0557   BUN 13 12/07/2022 0557   BUN 13 06/05/2022 1142   CREATININE 0.82 12/07/2022 0557   CALCIUM 7.8 (L) 12/07/2022 0557   PROT 6.1 06/05/2022 1142   ALBUMIN 4.1 06/05/2022 1142   AST 18 06/05/2022 1142   ALT 21 06/05/2022 1142   ALKPHOS 86 06/05/2022 1142   BILITOT 0.4 06/05/2022 1142   GFRNONAA >60 12/07/2022 0557     Diabetic Labs (most recent): Lab Results  Component Value Date   HGBA1C 5.7 (A) 03/10/2023   HGBA1C 6.1 (A) 11/08/2022   HGBA1C 6.0 (H) 06/05/2022     Lipid Panel ( most recent) Lipid Panel     Component Value Date/Time   CHOL 246 (H) 06/05/2022 1142   TRIG 165 (H) 06/05/2022 1142   HDL 41 06/05/2022 1142   CHOLHDL 6.0 (H) 06/05/2022 1142   LDLCALC 174 (H) 06/05/2022 1142   LABVLDL 31 06/05/2022 1142      Lab Results  Component Value Date   TSH 1.780 06/05/2022   TSH 2.830 10/02/2021   FREET4 1.08 10/02/2021           Assessment & Plan:   1) Type 2 diabetes mellitus with diabetic neuropathy, without long-term current use of insulin (HCC)   She presents today with her meter and logs showing mostly at target fasting glycemic profile.  Her POCT A1c today is 5.7%, improving from last visit of 6.1%.  She denies any hypoglycemia.  - Asra Gambrel Saldierna has currently uncontrolled  symptomatic type 2 DM since 72 years of age.  -Recent labs reviewed.  - I had a long discussion with her about the progressive nature of diabetes and the pathology behind its complications. -her diabetes is complicated by mild CKD, peripheral neuropathy and she remains at a high risk for more acute and chronic complications which include CAD, CVA, CKD, retinopathy, and neuropathy. These are all discussed in detail with her.  - Nutritional counseling repeated at each appointment due to patients tendency to fall back in to old habits.  - The patient admits there is a room for improvement in their diet and drink choices. -  Suggestion is made for the patient to avoid simple carbohydrates from their diet including Cakes, Sweet Desserts / Pastries, Ice Cream, Soda (diet and regular), Sweet Tea, Candies, Chips, Cookies, Sweet Pastries, Store Bought Juices, Alcohol in Excess of 1-2 drinks a Warren, Artificial Sweeteners, Coffee Creamer, and "Sugar-free" Products. This will help patient to have stable blood glucose profile and potentially avoid unintended weight gain.   - I encouraged the patient to switch to unprocessed or minimally processed complex starch and increased protein intake (animal or plant source), fruits, and vegetables.   - Patient is advised to stick to a routine mealtimes to  eat 3 meals a Warren and avoid unnecessary snacks (to snack only to correct hypoglycemia).  - I have approached her with the following individualized plan to manage her diabetes and patient agrees:   -She is advised to continue Ozempic 2 mg SQ weekly.  -she does not need to routinely monitor glucose due to monotherapy with Ozempic only.   - She does not tolerate Metformin due to GI upset (r/t her Celiac disease) and has allergy to sulfa meds.  - Specific targets for  A1c; LDL, HDL, and Triglycerides were discussed with the patient.  2) Blood Pressure /Hypertension:  her blood pressure is controlled to target.   she  is advised to continue her current medications including Lisinopril 20 mg p.o. daily with breakfast.  3) Lipids/Hyperlipidemia:    Her most recent lipid panel from 06/05/22 shows uncontrolled LDL of 174 but she admits she had not been taking her medications.  Her PCP counseled her on this and advised she restart it which she has.  She is advised to continue Lipitor 40 mg po daily at bedtime.  Side effects and precautions discussed with her.  She has appt coming up with her PCP.  4)  Weight/Diet:  her Body mass index is 33.73 kg/m.  -  clearly complicating her diabetes care.   she is a candidate for weight loss. I discussed with her the fact that loss of 5 - 10% of her  current body weight will have the most impact on her diabetes management.  Exercise, and detailed carbohydrates information provided  -  detailed on discharge instructions.  5) Chronic Care/Health Maintenance: -she is on ACEI/ARB and Statin medications and is encouraged to initiate and continue to follow up with Ophthalmology, Dentist, Podiatrist at least yearly or according to recommendations, and advised to stay away from smoking. I have recommended yearly flu vaccine and pneumonia vaccine at least every 5 years; moderate intensity exercise for up to 150 minutes weekly; and sleep for at least 7 hours a Warren.  - she is advised to maintain close follow up with Anabel Halon, MD for primary care needs, as well as her other providers for optimal and coordinated care.      I spent  30  minutes in the care of the patient today including review of labs from CMP, Lipids, Thyroid Function, Hematology (current and previous including abstractions from other facilities); face-to-face time discussing  her blood glucose readings/logs, discussing hypoglycemia and hyperglycemia episodes and symptoms, medications doses, her options of short and long term treatment based on the latest standards of care / guidelines;  discussion about incorporating  lifestyle medicine;  and documenting the encounter. Risk reduction counseling performed per USPSTF guidelines to reduce obesity and cardiovascular risk factors.     Please refer to Patient Instructions for Blood Glucose Monitoring and Insulin/Medications Dosing Guide"  in media tab for additional information. Please  also refer to " Patient Self Inventory" in the Media  tab for reviewed elements of pertinent patient history.  Kathyrn Sheriff participated in the discussions, expressed understanding, and voiced agreement with the above plans.  All questions were answered to her satisfaction. she is encouraged to contact clinic should she have any questions or concerns prior to her return visit.     Follow up plan: - Return in about 6 months (around 09/09/2023) for Diabetes F/U with A1c in office, No previsit labs.   Ronny Bacon, [facility] [address] [place], [place] [address] Phone:  [phone] Fax: [phone]  03/10/2023, 2:31 PM

## 2023-03-11 ENCOUNTER — Telehealth: Payer: Self-pay

## 2023-03-11 NOTE — Telephone Encounter (Signed)
Patient Advocate Encounter   Received notification from [facility] that prior authorization is required for Ozempic  Submitted: 03/11/23 Key [id number]  Status is pending

## 2023-03-14 NOTE — Telephone Encounter (Signed)
Pharmacy Patient Advocate Encounter  Prior Authorization for Ozempic has been APPROVED   Effective through 09/16/2023

## 2023-03-17 ENCOUNTER — Other Ambulatory Visit: Payer: Self-pay | Admitting: Nurse Practitioner

## 2023-03-24 NOTE — Telephone Encounter (Signed)
Patient was called and made aware. 

## 2023-03-26 ENCOUNTER — Ambulatory Visit: Payer: Medicare Other | Admitting: Internal Medicine

## 2023-04-03 ENCOUNTER — Other Ambulatory Visit: Payer: Self-pay | Admitting: Internal Medicine

## 2023-04-03 DIAGNOSIS — E782 Mixed hyperlipidemia: Secondary | ICD-10-CM

## 2023-04-24 ENCOUNTER — Ambulatory Visit: Payer: Medicare Other | Admitting: Internal Medicine

## 2023-04-24 ENCOUNTER — Encounter: Payer: Medicare Other | Admitting: Orthopedic Surgery

## 2023-04-28 ENCOUNTER — Telehealth: Payer: Self-pay

## 2023-04-28 ENCOUNTER — Encounter: Payer: Medicare Other | Admitting: Orthopedic Surgery

## 2023-04-28 DIAGNOSIS — Z792 Long term (current) use of antibiotics: Secondary | ICD-10-CM

## 2023-04-28 MED ORDER — CLINDAMYCIN HCL 150 MG PO CAPS: ORAL_CAPSULE | ORAL | 1 refills | Status: DC

## 2023-04-28 NOTE — Telephone Encounter (Signed)
Patient is needing a script for antibiotic by tomorrow for her dentist appointment on Wednesday  PATIENT USES [facility]

## 2023-04-28 NOTE — Telephone Encounter (Signed)
Medication sent to patient's pharmacy.

## 2023-04-29 ENCOUNTER — Other Ambulatory Visit: Payer: Self-pay | Admitting: Nurse Practitioner

## 2023-05-02 ENCOUNTER — Other Ambulatory Visit: Payer: Self-pay | Admitting: Internal Medicine

## 2023-05-02 ENCOUNTER — Encounter: Payer: Self-pay | Admitting: Internal Medicine

## 2023-05-02 DIAGNOSIS — U071 COVID-19: Secondary | ICD-10-CM | POA: Insufficient documentation

## 2023-05-02 MED ORDER — NIRMATRELVIR/RITONAVIR (PAXLOVID)TABLET: 3.0000 | ORAL_TABLET | Freq: Two times a day (BID) | ORAL | 0 refills | Status: AC

## 2023-05-02 NOTE — Telephone Encounter (Signed)
Scheduled per nurse

## 2023-05-05 ENCOUNTER — Encounter: Payer: Self-pay | Admitting: Internal Medicine

## 2023-05-05 ENCOUNTER — Telehealth (INDEPENDENT_AMBULATORY_CARE_PROVIDER_SITE_OTHER): Payer: Medicare HMO | Admitting: Internal Medicine

## 2023-05-05 ENCOUNTER — Ambulatory Visit: Payer: Medicare Other | Admitting: Internal Medicine

## 2023-05-05 DIAGNOSIS — U071 COVID-19: Secondary | ICD-10-CM | POA: Diagnosis not present

## 2023-05-05 NOTE — Progress Notes (Signed)
Virtual Visit via Video Note   Because of [name]'s co-morbid illnesses, she is at least at moderate risk for complications without adequate follow up.  This format is felt to be most appropriate for this patient at this time.  All issues noted in this document were discussed and addressed.  A limited physical exam was performed with this format.      Evaluation Performed:  Follow-up visit  Date:  05/05/2023   ID:  Monique Warren, DOB 02/29/1951, MRN [id number]  Patient Location: Home Provider Location: Office/Clinic  Participants: Patient Location of Patient: Home Location of Provider: Telehealth Consent was obtain for visit to be over via telehealth. I verified that I am speaking with the correct person using two identifiers.  PCP:  Anabel Halon, MD   Chief Complaint:  Cough and nasal congestion  History of Present Illness:    Monique Warren is a 72 y.o. female who has a video visit for complaint of nasal congestion, cough and fatigue since 05/01/23.  She tested positive for COVID the next day, and was placed on Paxlovid after MyChart message.  Today, she reports mild improvement in cough.  She has been taking Robitussin as needed for cough.  Denies any fever or chills.  She reports acid reflux and sores in mouth with Paxlovid, but is still taking rabeprazole for GERD.  She still has nasal congestion and postnasal drip.  The patient does have symptoms concerning for COVID-19 infection (fever, chills, cough, or new shortness of breath).   Past Medical, Surgical, Social History, Allergies, and Medications have been Reviewed.  Past Medical History:  Diagnosis Date   Anxiety    Arthritis    Asthma    Celiac disease    Depression    Diabetes mellitus, type II (HCC)    Diabetic neuropathy (HCC)    Diastolic dysfunction    Grade 1 with preserved EF   Dysrhythmia    GERD (gastroesophageal reflux disease)    Heart murmur    Hiatal hernia    Sliding    Hyperlipidemia    Hypertension    Migraine    Obstructive sleep apnea    Osteoporosis    Vertigo    Past Surgical History:  Procedure Laterality Date   ABDOMINAL HYSTERECTOMY  09/29/2003   APPENDECTOMY     BALLOON DILATION N/A 02/08/2022   Procedure: BALLOON DILATION;  Surgeon: Lanelle Bal, DO;  [facility];  Service: Endoscopy;  Laterality: N/A;   BIOPSY  02/08/2022   Procedure: BIOPSY;  Surgeon: Lanelle Bal, DO;  [facility];  Service: Endoscopy;;   BREAST BIOPSY Left    CARPAL TUNNEL RELEASE     CATARACT EXTRACTION Bilateral    October and November 2016   COLONOSCOPY  09/2017   Blake Divine, MD in [place]; 5 mm tubular adenoma in the descending colon s/p resected, mild sigmoid diverticulosis, internal hemorrhoids.  Recommended repeat colonoscopy in 5 years.   CRANIOTOMY FOR ANEURYSM / VERTEBROBASILAR / CAROTID CIRCULATION Right 10/24/2015   ESOPHAGOGASTRODUODENOSCOPY  09/2017   [name]; irregular Z-line s/p biopsy, decreased LES tone, 4 cm hiatal hernia, erythema in gastric antrum biopsied, normal examined duodenum biopsied.  Esophageal biopsy suggestive of reflux, no Barrett's, gastric biopsy with nonspecific chronic gastritis with intestinal metaplasia, duodenal biopsy with increased intraepithelial lymphocytes without blunting of villi, nonspecific.   ESOPHAGOGASTRODUODENOSCOPY (EGD) WITH PROPOFOL N/A 02/08/2022   Surgeon: Lanelle Bal, DO; 2 cm hiatal  hernia, short segment Barrett's esophagus without dysplasia, mild Schatzki's ring dilated, gastritis with biopsies benign, normal examined duodenum.  Repeat EGD in 5 years.   KNEE ARTHROSCOPY WITH MEDIAL MENISECTOMY Left 04/09/2022   Procedure: KNEE ARTHROSCOPY WITH PARTIAL MEDIAL MENISCECTOMY, LATERAL MENISCAL DEBRIDEMENT;  Surgeon: Vickki Hearing, MD;  Location: [facility];  Service: Orthopedics;  Laterality: Left;   KNEE ARTHROSCOPY WITH MENISCAL REPAIR Left 04/09/2022   Procedure: KNEE  ARTHROSCOPY WITH MEDIAL MENISCAL REPAIR;  Surgeon: Vickki Hearing, MD;  Location: [facility];  Service: Orthopedics;  Laterality: Left;   LAPAROSCOPY     TOTAL KNEE ARTHROPLASTY Right 12/06/2022   Procedure: TOTAL KNEE ARTHROPLASTY;  Surgeon: Vickki Hearing, MD;  Location: [facility];  Service: Orthopedics;  Laterality: Right;   UTERINE FIBROID SURGERY     WRIST SURGERY Bilateral      No outpatient medications have been marked as taking for the 05/05/23 encounter (Video Visit) with Anabel Halon, MD.   Current Facility-Administered Medications for the 05/05/23 encounter (Video Visit) with Anabel Halon, MD  Medication   bupivacaine-meloxicam ER (ZYNRELEF) injection 400 mg   bupivacaine-meloxicam ER (ZYNRELEF) injection 400 mg     Allergies:   Cefuroxime, Codeine, Sulfa antibiotics, Sulfamethoxazole-trimethoprim, and Gluten meal   ROS:   Please see the history of present illness.     All other systems reviewed and are negative.   Labs/Other Tests and Data Reviewed:    Recent Labs: 06/05/2022: ALT 21; TSH 1.780 12/07/2022: BUN 13; Creatinine, Ser 0.82; Potassium 4.3; Sodium 136 12/08/2022: Hemoglobin 12.8; Platelets 299   Recent Lipid Panel Lab Results  Component Value Date/Time   CHOL 246 (H) 06/05/2022 11:42 AM   TRIG 165 (H) 06/05/2022 11:42 AM   HDL 41 06/05/2022 11:42 AM   CHOLHDL 6.0 (H) 06/05/2022 11:42 AM   LDLCALC 174 (H) 06/05/2022 11:42 AM    Wt Readings from Last 3 Encounters:  03/10/23 184 lb 6.4 oz (83.6 kg)  01/20/23 182 lb 9.6 oz (82.8 kg)  01/01/23 181 lb 9.6 oz (82.4 kg)     Objective:    Vital Signs:  There were no vitals taken for this visit.   VITAL SIGNS:  reviewed GEN:  no acute distress EYES:  sclerae anicteric, EOMI - Extraocular Movements Intact RESPIRATORY:  normal respiratory effort, symmetric expansion NEURO:  alert and oriented x 3, no obvious focal deficit PSYCH:  normal affect  ASSESSMENT & PLAN:    COVID-19 Started Paxlovid  considering his chronic medical conditions Albuterol PRN for dyspnea/wheezing Robitussin as needed for cough Advised to use vaporizer or nasal saline spray as needed for nasal congestion   I discussed the assessment and treatment plan with the patient. The patient was provided an opportunity to ask questions, and all were answered. The patient agreed with the plan and demonstrated an understanding of the instructions.   The patient was advised to call back or seek an in-person evaluation if the symptoms worsen or if the condition fails to improve as anticipated.  The above assessment and management plan was discussed with the patient. The patient verbalized understanding of and has agreed to the management plan.   Medication Adjustments/Labs and Tests Ordered: Current medicines are reviewed at length with the patient today.  Concerns regarding medicines are outlined above.   Tests Ordered: No orders of the defined types were placed in this encounter.   Medication Changes: No orders of the defined types were placed in this encounter.    Note: This  dictation was prepared with Dragon dictation along with smaller phrase technology. Similar sounding words can be transcribed inadequately or may not be corrected upon review. Any transcriptional errors that result from this process are unintentional.      Disposition:  Follow up  Signed, Anabel Halon, MD  05/05/2023 11:44 AM     [facility]

## 2023-05-05 NOTE — Assessment & Plan Note (Addendum)
Started Paxlovid considering his chronic medical conditions Albuterol PRN for dyspnea/wheezing Robitussin as needed for cough Advised to use vaporizer or nasal saline spray as needed for nasal congestion

## 2023-05-12 ENCOUNTER — Encounter: Payer: Self-pay | Admitting: Orthopedic Surgery

## 2023-05-12 ENCOUNTER — Ambulatory Visit (INDEPENDENT_AMBULATORY_CARE_PROVIDER_SITE_OTHER): Payer: Medicare HMO | Admitting: Orthopedic Surgery

## 2023-05-12 ENCOUNTER — Ambulatory Visit: Payer: Medicare Other | Admitting: Internal Medicine

## 2023-05-12 VITALS — BP 129/80 | HR 103 | Ht 62.0 in | Wt 184.0 lb

## 2023-05-12 DIAGNOSIS — Z96651 Presence of right artificial knee joint: Secondary | ICD-10-CM | POA: Diagnosis not present

## 2023-05-12 DIAGNOSIS — M25512 Pain in left shoulder: Secondary | ICD-10-CM | POA: Diagnosis not present

## 2023-05-12 DIAGNOSIS — M545 Low back pain, unspecified: Secondary | ICD-10-CM | POA: Diagnosis not present

## 2023-05-12 DIAGNOSIS — M1711 Unilateral primary osteoarthritis, right knee: Secondary | ICD-10-CM

## 2023-05-12 MED ORDER — METHYLPREDNISOLONE ACETATE 40 MG/ML IJ SUSP
40.0000 mg | Freq: Once | INTRAMUSCULAR | Status: AC
  Administered 2023-05-12: 40 mg via INTRA_ARTICULAR

## 2023-05-12 NOTE — Patient Instructions (Signed)
Physical therapy at [facility] [phone]

## 2023-05-12 NOTE — Progress Notes (Signed)
VISIT TYPE: FOLLOW UP   Chief Complaint  Patient presents with   Post-op Follow-up    Right total knee replacement 3/22/24improving sometimes has trouble with stairs     Encounter Diagnoses  Name Primary?   S/P total knee replacement, right 12/06/22 Yes   Primary osteoarthritis of right knee     Assessment and Plan: #1 improved stable right total knee  2.  Chronic lower back pain with increasing symptoms recommend physical therapy  3.  Complains of left shoulder plain acute no trauma recommend subacromial injection  Follow-up in March for x-rays at the 1 year mark for the right total knee    HPI: [name] is at the 76-month point from her right total knee in March she is doing well.  She has no real pain in her knee some difficulty going up and down.  She was concerned that she felt some noise in the knee so this was evaluated as well.  She complained of acute left shoulder pain with no trauma and she also complained of exacerbation of her chronic lower back pain.  She says she used to get injections in the lower back seen at the spine center  No leg pain no bowel or bladder dysfunction at this time  As far as the shoulder goes she has decreased range of motion and pain NIGHT    BP 129/80   Pulse (!) 103   Ht 5\' 2"  (1.575 m)   Wt 184 lb (83.5 kg)   BMI 33.65 kg/m   Right Knee Exam   Muscle Strength  The patient has normal right knee strength.  Tenderness  The patient is experiencing no tenderness.   Range of Motion  Extension:  normal  Flexion:  120   Tests  Drawer:  Anterior - negative    Posterior - negative  Other  Erythema: absent Scars: present Sensation: normal Pulse: present Swelling: none Effusion: no effusion present   Back Exam   Tenderness  The patient is experiencing tenderness in the lumbar.  Other  Gait: normal   Comments:  No radicular symptoms or signs   Left Shoulder Exam   Tenderness  Left shoulder tenderness  location: Posterior soft tissue inferior to the acromion.  Range of Motion  Active abduction:  abnormal  External rotation:  normal  Forward flexion:  abnormal   Muscle Strength  Internal rotation: 5/5  Subscapularis: 5/5   Tests  Apprehension: negative Cross arm: negative Impingement: positive Drop arm: negative Sulcus: absent  Other  Erythema: absent Scars: absent Sensation: normal Pulse: present      Imaging NONE TODAY   A/P Encounter Diagnoses  Name Primary?   S/P total knee replacement, right 12/06/22 Yes   Primary osteoarthritis of right knee    [name]'s knee is doing well we can x-ray at the 1 year mark  She can have physical therapy on her lower back and an injection in her left shoulder  Procedure note the subacromial injection shoulder left   Verbal consent was obtained to inject the  Left   Shoulder  Timeout was completed to confirm the injection site is a subacromial space of the  left  shoulder  Medication used Depo-Medrol 40 mg and lidocaine 1% 3 cc  Anesthesia was provided by ethyl chloride  The injection was performed in the left  posterior subacromial space. After pinning the skin with alcohol and anesthetized the skin with ethyl chloride the subacromial space was injected using a 20-gauge  needle. There were no complications  Sterile dressing was applied.         No orders of the defined types were placed in this encounter.

## 2023-05-12 NOTE — Addendum Note (Signed)
Addended by: Michaele Offer on: 05/12/2023 03:37 PM   Modules accepted: Orders

## 2023-05-23 ENCOUNTER — Encounter (HOSPITAL_COMMUNITY): Payer: Medicare HMO

## 2023-05-23 DIAGNOSIS — Z1231 Encounter for screening mammogram for malignant neoplasm of breast: Secondary | ICD-10-CM

## 2023-05-28 ENCOUNTER — Other Ambulatory Visit: Payer: Self-pay | Admitting: Internal Medicine

## 2023-05-28 DIAGNOSIS — F331 Major depressive disorder, recurrent, moderate: Secondary | ICD-10-CM

## 2023-06-02 ENCOUNTER — Ambulatory Visit (HOSPITAL_COMMUNITY): Payer: Medicare HMO | Attending: Orthopedic Surgery

## 2023-06-02 ENCOUNTER — Other Ambulatory Visit: Payer: Self-pay | Admitting: Internal Medicine

## 2023-06-02 DIAGNOSIS — M25661 Stiffness of right knee, not elsewhere classified: Secondary | ICD-10-CM | POA: Insufficient documentation

## 2023-06-02 DIAGNOSIS — R262 Difficulty in walking, not elsewhere classified: Secondary | ICD-10-CM | POA: Diagnosis not present

## 2023-06-02 DIAGNOSIS — M545 Low back pain, unspecified: Secondary | ICD-10-CM | POA: Insufficient documentation

## 2023-06-02 DIAGNOSIS — M6281 Muscle weakness (generalized): Secondary | ICD-10-CM | POA: Diagnosis not present

## 2023-06-02 DIAGNOSIS — M5459 Other low back pain: Secondary | ICD-10-CM | POA: Insufficient documentation

## 2023-06-02 NOTE — Addendum Note (Signed)
Addended by: Seymour Bars on: 06/02/2023 03:51 PM   Modules accepted: Orders

## 2023-06-02 NOTE — Therapy (Signed)
[facility] OUTPATIENT PHYSICAL THERAPY EVALUATION (THORACOLUMBAR)   Patient Name: Monique Warren MRN: [id number] DOB:[birth year], 72 y.o., female Today's Date: 06/02/2023  END OF SESSION:   PT End of Session - 06/02/23 1114     Visit Number 1    Number of Visits 8    Date for PT Re-Evaluation 07/28/23    Authorization Type [employer]    Authorization Time Period Cert dates: 4/09 - 07/28/23    Progress Note Due on Visit 8    PT Start Time 1100    PT Stop Time 1145    PT Time Calculation (min) 45 min    Activity Tolerance Patient tolerated treatment well    Behavior During Therapy WFL for tasks assessed/performed              Past Medical History:  Diagnosis Date   Anxiety    Arthritis    Asthma    Celiac disease    Depression    Diabetes mellitus, type II (HCC)    Diabetic neuropathy (HCC)    Diastolic dysfunction    Grade 1 with preserved EF   Dysrhythmia    GERD (gastroesophageal reflux disease)    Heart murmur    Hiatal hernia    Sliding   Hyperlipidemia    Hypertension    Migraine    Obstructive sleep apnea    Osteoporosis    Vertigo    Past Surgical History:  Procedure Laterality Date   ABDOMINAL HYSTERECTOMY  09/29/2003   APPENDECTOMY     BALLOON DILATION N/A 02/08/2022   Procedure: BALLOON DILATION;  Surgeon: Lanelle Bal, DO;  [facility];  Service: Endoscopy;  Laterality: N/A;   BIOPSY  02/08/2022   Procedure: BIOPSY;  Surgeon: Lanelle Bal, DO;  [facility];  Service: Endoscopy;;   BREAST BIOPSY Left    CARPAL TUNNEL RELEASE     CATARACT EXTRACTION Bilateral    October and November 2016   COLONOSCOPY  09/2017   Blake Divine, MD in [place]; 5 mm tubular adenoma in the descending colon s/p resected, mild sigmoid diverticulosis, internal hemorrhoids.  Recommended repeat colonoscopy in 5 years.   CRANIOTOMY FOR ANEURYSM / VERTEBROBASILAR / CAROTID CIRCULATION Right 10/24/2015   ESOPHAGOGASTRODUODENOSCOPY  09/2017   [name];  irregular Z-line s/p biopsy, decreased LES tone, 4 cm hiatal hernia, erythema in gastric antrum biopsied, normal examined duodenum biopsied.  Esophageal biopsy suggestive of reflux, no Barrett's, gastric biopsy with nonspecific chronic gastritis with intestinal metaplasia, duodenal biopsy with increased intraepithelial lymphocytes without blunting of villi, nonspecific.   ESOPHAGOGASTRODUODENOSCOPY (EGD) WITH PROPOFOL N/A 02/08/2022   Surgeon: Lanelle Bal, DO; 2 cm hiatal hernia, short segment Barrett's esophagus without dysplasia, mild Schatzki's ring dilated, gastritis with biopsies benign, normal examined duodenum.  Repeat EGD in 5 years.   KNEE ARTHROSCOPY WITH MEDIAL MENISECTOMY Left 04/09/2022   Procedure: KNEE ARTHROSCOPY WITH PARTIAL MEDIAL MENISCECTOMY, LATERAL MENISCAL DEBRIDEMENT;  Surgeon: Vickki Hearing, MD;  Location: [facility];  Service: Orthopedics;  Laterality: Left;   KNEE ARTHROSCOPY WITH MENISCAL REPAIR Left 04/09/2022   Procedure: KNEE ARTHROSCOPY WITH MEDIAL MENISCAL REPAIR;  Surgeon: Vickki Hearing, MD;  Location: [facility];  Service: Orthopedics;  Laterality: Left;   LAPAROSCOPY     TOTAL KNEE ARTHROPLASTY Right 12/06/2022   Procedure: TOTAL KNEE ARTHROPLASTY;  Surgeon: Vickki Hearing, MD;  Location: [facility];  Service: Orthopedics;  Laterality: Right;   UTERINE FIBROID SURGERY     WRIST SURGERY Bilateral  Patient Active Problem List   Diagnosis Date Noted   COVID-19 05/02/2023   Primary osteoarthritis of right knee 12/06/2022   Status post total right knee replacement 12/06/2022   Chronic fatigue 10/02/2022   Acute pain of right shoulder 07/05/2022   S/P left knee arthroscopy 04/09/22 04/15/2022   Acute medial meniscus tear of left knee    Tear of lateral meniscus of left knee    Encounter for general adult medical examination with abnormal findings 04/01/2022   Dysphagia 01/16/2022   Gastroesophageal reflux disease 01/16/2022   Barrett's esophagus  without dysplasia 01/16/2022   Chronic idiopathic constipation 01/16/2022   Elevated LFTs 01/16/2022   Fatty liver 01/16/2022   History of colonic polyps 01/16/2022   Constipation 10/02/2021   Bilateral thumb pain 08/14/2021   Chest pain 07/02/2021   Hernia of abdominal cavity 06/12/2021   Vertigo 06/12/2021   Carpal tunnel syndrome of right wrist 05/16/2021   Polyneuropathy due to type 2 diabetes mellitus (HCC) 05/16/2021   MDD (major depressive disorder), recurrent episode (HCC) 04/19/2021   HTN (hypertension) 04/19/2021   BPPV (benign paroxysmal positional vertigo) 09/25/2019   Celiac disease 09/25/2019   Obstructive sleep apnea 09/25/2019   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   Hyperlipidemia 10/16/2015   Arthritis 09/27/2015   Asthma 09/27/2015   Migraine without aura or status migrainosus 09/27/2015   Other intervertebral disc degeneration, lumbosacral region 01/26/2014   Neck pain 12/31/2013   Osteoporosis 09/16/2008   Type 2 diabetes mellitus with diabetic neuropathy, unspecified (HCC) 09/16/2008   Sliding hiatal hernia 1951/03/03    PCP: Anabel Halon, MDRef Provider (PCP)   REFERRING PROVIDER:   Vickki Hearing, MD    REFERRING DIAG: M54.50 (ICD-10-CM) - Lumbar pain   Rationale for Evaluation and Treatment: Rehabilitation  THERAPY DIAG:  Difficulty in walking, not elsewhere classified  Other low back pain  Muscle weakness (generalized)  ONSET DATE: Chronic 30+ yrs --------------------------------------------------------------------------------------------- SUBJECTIVE:                                                                                                                                                                                           SUBJECTIVE STATEMENT: Patient has had chronic LBP for 30+ years. Patient had right TKR March 2024 and finished rehab here at [facility]. Patient went to MD for right knee f/u on 05/12/23 and  was referred to OPPT for LBP this time around. Patient also has had GI issues at the moment and will have GI appointment.    PERTINENT HISTORY:  Afib- medicated Right TKR  Celiac *Vertigo- keep head elevated in supine;  SUPINE TO  [facility] OUTPATIENT PHYSICAL THERAPY EVALUATION (THORACOLUMBAR)   Patient Name: Monique Warren MRN: [id number] DOB:[birth year], 72 y.o., female Today's Date: 06/02/2023  END OF SESSION:   PT End of Session - 06/02/23 1114     Visit Number 1    Number of Visits 8    Date for PT Re-Evaluation 07/28/23    Authorization Type [employer]et[employer]    Authorization Time Period Cert dates: 4/09 - 07/28/23    Progress Note Due on Visit 8    PT Start Time 1100    PT Stop Time 1145    PT Time Calculation (min) 45 min    Activity Tolerance Patient tolerated treatment well    Behavior During Therapy WFL for tasks assessed/performed              Past Medical History:  Diagnosis Date   Anxiety    Arthritis    Asthma    Celiac disease    Depression    Diabetes mellitus, type II (HCC)    Diabetic neuropathy (HCC)    Diastolic dysfunction    Grade 1 with preserved EF   Dysrhythmia    GERD (gastroesophageal reflux disease)    Heart murmur    Hiatal hernia    Sliding   Hyperlipidemia    Hypertension    Migraine    Obstructive sleep apnea    Osteoporosis    Vertigo    Past Surgical History:  Procedure Laterality Date   ABDOMINAL HYSTERECTOMY  09/29/2003   APPENDECTOMY     BALLOON DILATION N/A 02/08/2022   Procedure: BALLOON DILATION;  Surgeon: Lanelle Bal, DO;  [facility];  Service: Endoscopy;  Laterality: N/A;   BIOPSY  02/08/2022   Procedure: BIOPSY;  Surgeon: Lanelle Bal, DO;  [facility];  Service: Endoscopy;;   BREAST BIOPSY Left    CARPAL TUNNEL RELEASE     CATARACT EXTRACTION Bilateral    October and November 2016   COLONOSCOPY  09/2017   Blake Divine, MD in [place]; 5 mm tubular adenoma in the descending colon s/p resected, mild sigmoid diverticulosis, internal hemorrhoids.  Recommended repeat colonoscopy in 5 years.   CRANIOTOMY FOR ANEURYSM / VERTEBROBASILAR / CAROTID CIRCULATION Right 10/24/2015   ESOPHAGOGASTRODUODENOSCOPY  09/2017   [name];  irregular Z-line s/p biopsy, decreased LES tone, 4 cm hiatal hernia, erythema in gastric antrum biopsied, normal examined duodenum biopsied.  Esophageal biopsy suggestive of reflux, no Barrett's, gastric biopsy with nonspecific chronic gastritis with intestinal metaplasia, duodenal biopsy with increased intraepithelial lymphocytes without blunting of villi, nonspecific.   ESOPHAGOGASTRODUODENOSCOPY (EGD) WITH PROPOFOL N/A 02/08/2022   Surgeon: Lanelle Bal, DO; 2 cm hiatal hernia, short segment Barrett's esophagus without dysplasia, mild Schatzki's ring dilated, gastritis with biopsies benign, normal examined duodenum.  Repeat EGD in 5 years.   KNEE ARTHROSCOPY WITH MEDIAL MENISECTOMY Left 04/09/2022   Procedure: KNEE ARTHROSCOPY WITH PARTIAL MEDIAL MENISCECTOMY, LATERAL MENISCAL DEBRIDEMENT;  Surgeon: Vickki Hearing, MD;  Location[facility];  Service: Orthopedics;  Laterality: Left;   KNEE ARTHROSCOPY WITH MENISCAL REPAIR Left 04/09/2022   Procedure: KNEE ARTHROSCOPY WITH MEDIAL MENISCAL REPAIR;  Surgeon: Vickki Hearing, MD;  Location: [facility];  Service: Orthopedics;  Laterality: Left;   LAPAROSCOPY     TOTAL KNEE ARTHROPLASTY Right 12/06/2022   Procedure: TOTAL KNEE ARTHROPLASTY;  Surgeon: Vickki Hearing, MD;  [facility];  Service: Orthopedics;  Laterality: Right;   UTERINE FIBROID SURGERY     WRIST SURGERY Bilateral  [facility] OUTPATIENT PHYSICAL THERAPY EVALUATION (THORACOLUMBAR)   Patient Name: Monique Warren MRN: [id number] DOB:[birth year], 72 y.o., female Today's Date: 06/02/2023  END OF SESSION:   PT End of Session - 06/02/23 1114     Visit Number 1    Number of Visits 8    Date for PT Re-Evaluation 07/28/23    Authorization Type [employer]et[employer]    Authorization Time Period Cert dates: 4/09 - 07/28/23    Progress Note Due on Visit 8    PT Start Time 1100    PT Stop Time 1145    PT Time Calculation (min) 45 min    Activity Tolerance Patient tolerated treatment well    Behavior During Therapy WFL for tasks assessed/performed              Past Medical History:  Diagnosis Date   Anxiety    Arthritis    Asthma    Celiac disease    Depression    Diabetes mellitus, type II (HCC)    Diabetic neuropathy (HCC)    Diastolic dysfunction    Grade 1 with preserved EF   Dysrhythmia    GERD (gastroesophageal reflux disease)    Heart murmur    Hiatal hernia    Sliding   Hyperlipidemia    Hypertension    Migraine    Obstructive sleep apnea    Osteoporosis    Vertigo    Past Surgical History:  Procedure Laterality Date   ABDOMINAL HYSTERECTOMY  09/29/2003   APPENDECTOMY     BALLOON DILATION N/A 02/08/2022   Procedure: BALLOON DILATION;  Surgeon: Lanelle Bal, DO;  [facility];  Service: Endoscopy;  Laterality: N/A;   BIOPSY  02/08/2022   Procedure: BIOPSY;  Surgeon: Lanelle Bal, DO;  [facility];  Service: Endoscopy;;   BREAST BIOPSY Left    CARPAL TUNNEL RELEASE     CATARACT EXTRACTION Bilateral    October and November 2016   COLONOSCOPY  09/2017   Blake Divine, MD in [place]; 5 mm tubular adenoma in the descending colon s/p resected, mild sigmoid diverticulosis, internal hemorrhoids.  Recommended repeat colonoscopy in 5 years.   CRANIOTOMY FOR ANEURYSM / VERTEBROBASILAR / CAROTID CIRCULATION Right 10/24/2015   ESOPHAGOGASTRODUODENOSCOPY  09/2017   [name];  irregular Z-line s/p biopsy, decreased LES tone, 4 cm hiatal hernia, erythema in gastric antrum biopsied, normal examined duodenum biopsied.  Esophageal biopsy suggestive of reflux, no Barrett's, gastric biopsy with nonspecific chronic gastritis with intestinal metaplasia, duodenal biopsy with increased intraepithelial lymphocytes without blunting of villi, nonspecific.   ESOPHAGOGASTRODUODENOSCOPY (EGD) WITH PROPOFOL N/A 02/08/2022   Surgeon: Lanelle Bal, DO; 2 cm hiatal hernia, short segment Barrett's esophagus without dysplasia, mild Schatzki's ring dilated, gastritis with biopsies benign, normal examined duodenum.  Repeat EGD in 5 years.   KNEE ARTHROSCOPY WITH MEDIAL MENISECTOMY Left 04/09/2022   Procedure: KNEE ARTHROSCOPY WITH PARTIAL MEDIAL MENISCECTOMY, LATERAL MENISCAL DEBRIDEMENT;  Surgeon: Vickki Hearing, MD;  [facility];  Service: Orthopedics;  Laterality: Left;   KNEE ARTHROSCOPY WITH MENISCAL REPAIR Left 04/09/2022   Procedure: KNEE ARTHROSCOPY WITH MEDIAL MENISCAL REPAIR;  Surgeon: Vickki Hearing, MD;  [facility];  Service: Orthopedics;  Laterality: Left;   LAPAROSCOPY     TOTAL KNEE ARTHROPLASTY Right 12/06/2022   Procedure: TOTAL KNEE ARTHROPLASTY;  Surgeon: Vickki Hearing, MD;  [facility];  Service: Orthopedics;  Laterality: Right;   UTERINE FIBROID SURGERY     WRIST SURGERY Bilateral

## 2023-06-08 NOTE — Progress Notes (Unsigned)
Referring Provider: Anabel Halon, MD Primary Care Physician:  Anabel Halon, MD Primary GI Physician: Dr. Marletta Lor  No chief complaint on file.   HPI:   Monique Warren is a 72 y.o. female with a history of of type 2 diabetes, HTN, HLD, OSA, grade 1 diastolic dysfunction with preserved EF, possible celiac disease with duodenal  biopsies non-specific in 2019, Barrett's esophagus, GERD, constipation, adenomatous colon polyps, fatty liver, elevated liver enzymes previously with normalization in September 2023, presenting today with chief complaint of chest pain/GERD.***   Patient had office visit with cardiology 01/20/2023 noting atypical chest pain.  It was felt that this was most likely GI related.  Stated workup last year showed no CAD.   Last seen by our office via virtual visit 05/17/2022.  Noted some improvement in dysphagia after esophageal dilation in May 2023.  GERD was not adequately managed on Pepcid 40 mg at bedtime.  She was knowing her taking Aciphex and did not realize this.  She had previously been taking Aciphex twice a day.  For constipation, she reported Trulance was not helpful and she was taking milk of magnesia once a week with bowel movements on these days.  Plan included barium pill esophagram, resume Aciphex 20 mg daily, start Linzess 72 mcg daily.  Regarding her history of mildly elevated LFTs and fatty liver, she had intentionally lost over 20 pounds with the help of Ozempic.  She had plans for labs the following week with her primary care provider.  Recommended additional workup if LFTs remain elevated.  Also recommended abdominal ultrasound, avoid OTC supplements/herbal teas/alcohol.  BPE 05/22/2022 with small hiatal hernia, subtle area of mucosal irregularity at distal esophagus above hiatal hernia possibly related to Barrett's esophagus.  Offered esophageal manometry, but patient preferred to focus on GERD management first.   Abdominal ultrasound 05/28/2022 with  hepatic steatosis.  No focal liver lesion.  Received labs completed 06/05/2022.  LFTs had normalized.     Today:       EGD 02/08/22: 2 cm hiatal hernia, short segments barretts biopsied, mild Schatzki ring dilated, gastritis biopsied, normal examined duodenum.  Gastric biopsy was benign, esophageal biopsy consistent with Barrett's esophagus, negative for dysplasia.  Recommended repeat EGD in 5 years.   Past Medical History:  Diagnosis Date   Anxiety    Arthritis    Asthma    Celiac disease    Depression    Diabetes mellitus, type II (HCC)    Diabetic neuropathy (HCC)    Diastolic dysfunction    Grade 1 with preserved EF   Dysrhythmia    GERD (gastroesophageal reflux disease)    Heart murmur    Hiatal hernia    Sliding   Hyperlipidemia    Hypertension    Migraine    Obstructive sleep apnea    Osteoporosis    Vertigo     Past Surgical History:  Procedure Laterality Date   ABDOMINAL HYSTERECTOMY  09/29/2003   APPENDECTOMY     BALLOON DILATION N/A 02/08/2022   Procedure: BALLOON DILATION;  Surgeon: Lanelle Bal, DO;  [facility]: [facility];  Service: Endoscopy;  Laterality: N/A;   BIOPSY  02/08/2022   Procedure: BIOPSY;  Surgeon: Lanelle Bal, DO;  Location: [facility];  Service: Endoscopy;;   BREAST BIOPSY Left    CARPAL TUNNEL RELEASE     CATARACT EXTRACTION Bilateral    October and November 2016   COLONOSCOPY  09/2017   Blake Divine,  MD in [place]; 5 mm tubular adenoma in the descending colon s/p resected, mild sigmoid diverticulosis, internal hemorrhoids.  Recommended repeat colonoscopy in 5 years.   CRANIOTOMY FOR ANEURYSM / VERTEBROBASILAR / CAROTID CIRCULATION Right 10/24/2015   ESOPHAGOGASTRODUODENOSCOPY  09/2017   [name]; irregular Z-line s/p biopsy, decreased LES tone, 4 cm hiatal hernia, erythema in gastric antrum biopsied, normal examined duodenum biopsied.  Esophageal biopsy suggestive of reflux, no Barrett's, gastric biopsy with  nonspecific chronic gastritis with intestinal metaplasia, duodenal biopsy with increased intraepithelial lymphocytes without blunting of villi, nonspecific.   ESOPHAGOGASTRODUODENOSCOPY (EGD) WITH PROPOFOL N/A 02/08/2022   Surgeon: Lanelle Bal, DO; 2 cm hiatal hernia, short segment Barrett's esophagus without dysplasia, mild Schatzki's ring dilated, gastritis with biopsies benign, normal examined duodenum.  Repeat EGD in 5 years.   KNEE ARTHROSCOPY WITH MEDIAL MENISECTOMY Left 04/09/2022   Procedure: KNEE ARTHROSCOPY WITH PARTIAL MEDIAL MENISCECTOMY, LATERAL MENISCAL DEBRIDEMENT;  Surgeon: Vickki Hearing, MD;  Location: [facility];  Service: Orthopedics;  Laterality: Left;   KNEE ARTHROSCOPY WITH MENISCAL REPAIR Left 04/09/2022   Procedure: KNEE ARTHROSCOPY WITH MEDIAL MENISCAL REPAIR;  Surgeon: Vickki Hearing, MD;  Location: [facility];  Service: Orthopedics;  Laterality: Left;   LAPAROSCOPY     TOTAL KNEE ARTHROPLASTY Right 12/06/2022   Procedure: TOTAL KNEE ARTHROPLASTY;  Surgeon: Vickki Hearing, MD;  Location: [facility];  Service: Orthopedics;  Laterality: Right;   UTERINE FIBROID SURGERY     WRIST SURGERY Bilateral     Current Outpatient Medications  Medication Sig Dispense Refill   acetaminophen (TYLENOL) 500 MG tablet Take 1 tablet (500 mg total) by mouth every 6 (six) hours as needed for moderate pain. 30 tablet 0   AIMOVIG 140 MG/ML SOAJ INJECT 140 MG UNDER THE SKIN EVERY 30 DAYS 1 mL 11   albuterol (VENTOLIN HFA) 108 (90 Base) MCG/ACT inhaler USE 2 INHALATIONS EVERY 6 HOURS AS NEEDED FOR WHEEZING OR SHORTNESS OF BREATH 8.5 g 10   aspirin EC 325 MG tablet Take 1 tablet (325 mg total) by mouth daily with breakfast. 35 tablet 0   atorvastatin (LIPITOR) 40 MG tablet TAKE 1 TABLET AT BEDTIME 90 tablet 3   Blood Glucose Monitoring Suppl (ONE TOUCH ULTRA 2) w/Device KIT USE TO CHECK BLOOD GLUCOSE 4 TIMES A DAY 1 kit 0   Cholecalciferol (VITAMIN D3) 125 MCG (5000 UT) capsule Take  5,000 Units by mouth daily.     clindamycin (CLEOCIN) 150 MG capsule Take 1 - 30 min prior to dental work 5 capsule 1   diltiazem (CARDIZEM SR) 120 MG 12 hr capsule TAKE 2 CAPSULES DAILY 180 capsule 3   docusate sodium (COLACE) 100 MG capsule Take 1 capsule (100 mg total) by mouth 2 (two) times daily. 10 capsule 0   DULoxetine (CYMBALTA) 60 MG capsule Take 1 capsule (60 mg total) by mouth daily. 90 capsule 3   Estradiol 10 MCG TABS vaginal tablet Place 10 mcg vaginally every Monday.     famotidine (PEPCID) 40 MG tablet Take 1 tablet (40 mg total) by mouth daily. 90 tablet 3   Fluticasone-Salmeterol,sensor, (AIRDUO DIGIHALER) 232-14 MCG/ACT AEPB Inhale 1 Inhalation into the lungs 2 (two) times daily. USE 1 INHALATION TWICE A DAY Strength: 232-14 MCG/ACT 1 each 11   gabapentin (NEURONTIN) 300 MG capsule Take 1 capsule (300 mg total) by mouth at bedtime. 90 capsule 3   Lancets (ONETOUCH DELICA PLUS LANCET33G) MISC Use to check glucose once daily 100 each 6   LINZESS 72  MCG capsule TAKE 1 CAPSULE DAILY BEFORE BREAKFAST 30 capsule 11   meclizine (ANTIVERT) 12.5 MG tablet Take 1 tablet (12.5 mg total) by mouth 3 (three) times daily as needed for dizziness. 30 tablet 0   methocarbamol (ROBAXIN) 500 MG tablet Take 1 tablet (500 mg total) by mouth every 6 (six) hours as needed for muscle spasms. 60 tablet 2   metoprolol succinate (TOPROL-XL) 50 MG 24 hr tablet 1 tablet (25 mg total) daily. Take with or immediately following a meal.. 90 tablet 3   Multiple Vitamin (MULTIVITAMIN WITH MINERALS) TABS tablet Take 1 tablet by mouth daily.     ONETOUCH ULTRA test strip Use to monitor glucose once daily 100 each 6   polyethylene glycol (MIRALAX / GLYCOLAX) 17 g packet Take 17 g by mouth daily as needed for mild constipation. 14 each 0   PROLIA 60 MG/ML SOSY injection INJECT 60 MG UNDER THE SKIN EVERY 6 MONTHS 1 mL 1   promethazine (PHENERGAN) 12.5 MG tablet Take 1 tablet (12.5 mg total) by mouth every 6 (six)  hours as needed for nausea or vomiting. 30 tablet 0   RABEprazole (ACIPHEX) 20 MG tablet TAKE 1 TABLET DAILY BEFORE BREAKFAST 30 tablet 11   Semaglutide, 2 MG/DOSE, 8 MG/3ML SOPN Inject 2 mg as directed once a week. 6 mL 3   traMADol (ULTRAM) 50 MG tablet Take 1 tablet (50 mg total) by mouth every 6 (six) hours. 30 tablet 0   venlafaxine (EFFEXOR) 37.5 MG tablet TAKE 1 TABLET TWICE A DAY 60 tablet 3   zolmitriptan (ZOMIG) 5 MG tablet Take 5 mg by mouth daily as needed for migraine.     Current Facility-Administered Medications  Medication Dose Route Frequency Provider Last Rate Last Admin   bupivacaine-meloxicam ER (ZYNRELEF) injection 400 mg  400 mg Infiltration Once Vickki Hearing, MD       bupivacaine-meloxicam ER (ZYNRELEF) injection 400 mg  400 mg Infiltration Once Vickki Hearing, MD        Allergies as of 06/11/2023 - Review Complete 05/12/2023  Allergen Reaction Noted   Cefuroxime Anaphylaxis, Hives, Shortness Of Breath, Swelling, and Anxiety 09/16/2006   Codeine Hives, Shortness Of Breath, Nausea And Vomiting, Swelling, and Anxiety 09/16/2006   Sulfa antibiotics Anaphylaxis, Hives, Nausea And Vomiting, and Swelling 09/02/2017   Sulfamethoxazole-trimethoprim Shortness Of Breath, Swelling, and Anxiety 09/16/2006   Gluten meal  02/05/2022    Family History  Problem Relation Age of Onset   Breast cancer Mother    Thyroid disease Mother    Stroke Mother    Hypertension Father    Hyperlipidemia Father    Heart attack Father    Heart failure Father    Breast cancer Paternal Grandmother    Cancer - Colon Neg Hx    Gastric cancer Neg Hx    Esophageal cancer Neg Hx    Liver cancer Neg Hx    Autoimmune disease Neg Hx     Social History   Socioeconomic History   Marital status: Married    Spouse name: Not on file   Number of children: Not on file   Years of education: Not on file   Highest education level: GED or equivalent  Occupational History   Not on file   Tobacco Use   Smoking status: Former    Current packs/day: 0.50    Types: Cigarettes   Smokeless tobacco: Never  Vaping Use   Vaping status: Never Used  Substance and Sexual Activity  Alcohol use: Yes    Alcohol/week: 4.0 standard drinks of alcohol    Types: 2 Glasses of wine, 2 Shots of liquor per week    Comment: rarely   Drug use: Never   Sexual activity: Not on file  Other Topics Concern   Not on file  Social History Narrative   Not on file   Social Determinants of Health   Financial Resource Strain: Low Risk  (01/01/2023)   Overall Financial Resource Strain (CARDIA)    Difficulty of Paying Living Expenses: Not hard at all  Food Insecurity: No Food Insecurity (01/01/2023)   Hunger Vital Sign    Worried About Running Out of Food in the Last Year: Never true    Ran Out of Food in the Last Year: Never true  Transportation Needs: No Transportation Needs (01/01/2023)   PRAPARE - Administrator, Civil Service (Medical): No    Lack of Transportation (Non-Medical): No  Physical Activity: Unknown (01/01/2023)   Exercise Vital Sign    Days of Exercise per Week: 0 days    Minutes of Exercise per Session: Not on file  Stress: Stress Concern Present (01/01/2023)   [employer] of Occupational Health - Occupational Stress Questionnaire    Feeling of Stress : To some extent  Social Connections: Moderately Integrated (01/01/2023)   Social Connection and Isolation Panel [NHANES]    Frequency of Communication with Friends and Family: More than three times a week    Frequency of Social Gatherings with Friends and Family: More than three times a week    Attends Religious Services: 1 to 4 times per year    Active Member of [employer] or Organizations: No    Attends Engineer, structural: Not on file    Marital Status: Married    Review of Systems: Gen: Denies fever, chills, anorexia. Denies fatigue, weakness, weight loss.  CV: Denies chest pain, palpitations,  syncope, peripheral edema, and claudication. Resp: Denies dyspnea at rest, cough, wheezing, coughing up blood, and pleurisy. GI: Denies vomiting blood, jaundice, and fecal incontinence.   Denies dysphagia or odynophagia. Derm: Denies rash, itching, dry skin Psych: Denies depression, anxiety, memory loss, confusion. No homicidal or suicidal ideation.  Heme: Denies bruising, bleeding, and enlarged lymph nodes.  Physical Exam: There were no vitals taken for this visit. General:   Alert and oriented. No distress noted. Pleasant and cooperative.  Head:  Normocephalic and atraumatic. Eyes:  Conjuctiva clear without scleral icterus. Heart:  S1, S2 present without murmurs appreciated. Lungs:  Clear to auscultation bilaterally. No wheezes, rales, or rhonchi. No distress.  Abdomen:  +BS, soft, non-tender and non-distended. No rebound or guarding. No HSM or masses noted. Msk:  Symmetrical without gross deformities. Normal posture. Extremities:  Without edema. Neurologic:  Alert and  oriented x4 Psych:  Normal mood and affect.    Assessment:     Plan:  ***   Ermalinda Memos, PA-C [facility] 06/11/2023

## 2023-06-11 ENCOUNTER — Encounter: Payer: Self-pay | Admitting: Gastroenterology

## 2023-06-11 ENCOUNTER — Ambulatory Visit: Payer: Medicare HMO | Admitting: Gastroenterology

## 2023-06-11 ENCOUNTER — Encounter: Payer: Self-pay | Admitting: *Deleted

## 2023-06-11 ENCOUNTER — Other Ambulatory Visit: Payer: Self-pay | Admitting: *Deleted

## 2023-06-11 VITALS — BP 138/82 | HR 99 | Temp 97.9°F | Ht 62.0 in | Wt 184.6 lb

## 2023-06-11 DIAGNOSIS — K219 Gastro-esophageal reflux disease without esophagitis: Secondary | ICD-10-CM | POA: Diagnosis not present

## 2023-06-11 DIAGNOSIS — R072 Precordial pain: Secondary | ICD-10-CM | POA: Diagnosis not present

## 2023-06-11 DIAGNOSIS — K5909 Other constipation: Secondary | ICD-10-CM | POA: Diagnosis not present

## 2023-06-11 DIAGNOSIS — Z86010 Personal history of colonic polyps: Secondary | ICD-10-CM | POA: Diagnosis not present

## 2023-06-11 DIAGNOSIS — R1310 Dysphagia, unspecified: Secondary | ICD-10-CM | POA: Diagnosis not present

## 2023-06-11 DIAGNOSIS — K5904 Chronic idiopathic constipation: Secondary | ICD-10-CM

## 2023-06-11 DIAGNOSIS — Z860101 Personal history of adenomatous and serrated colon polyps: Secondary | ICD-10-CM

## 2023-06-11 DIAGNOSIS — K900 Celiac disease: Secondary | ICD-10-CM

## 2023-06-11 MED ORDER — PEG 3350-KCL-NA BICARB-NACL 420 G PO SOLR: 4000.0000 mL | Freq: Once | ORAL | 0 refills | Status: AC

## 2023-06-11 MED ORDER — LINACLOTIDE 72 MCG PO CAPS: 72.0000 ug | ORAL_CAPSULE | Freq: Every day | ORAL | 3 refills | Status: DC

## 2023-06-11 MED ORDER — RABEPRAZOLE SODIUM 20 MG PO TBEC
20.0000 mg | DELAYED_RELEASE_TABLET | Freq: Two times a day (BID) | ORAL | 3 refills | Status: DC
Start: 2023-06-11 — End: 2023-07-11

## 2023-06-11 NOTE — Patient Instructions (Addendum)
Please have the celiac labs completed with your primary doctor when you have routine blood work on 9/30.  Please request that all lab results are faxed to me for review.  We will get you scheduled for colonoscopy in the near future with Dr. Marletta Lor. We will have to hold Ozempic for 1 week prior to your procedure.  Resume Linzess 72 mcg daily at least 30 minutes before breakfast.  Increase Aciphex to 20 mg twice daily 30 minutes before breakfast and dinner.  Follow a GERD diet/lifestyle:  Avoid fried, fatty, greasy, spicy, citrus foods. Avoid caffeine and carbonated beverages. Avoid chocolate. Try eating 4-6 small meals a day rather than 3 large meals. Do not eat within 3 hours of laying down. Prop head of bed up on wood or bricks to create a 6 inch incline.  I will plan to see back in the office in 3 months or sooner if needed.  It was good to see you again today!  Ermalinda Memos, PA-C [facility]

## 2023-06-11 NOTE — Addendum Note (Signed)
Addended by: Ermalinda Memos on: 06/11/2023 04:23 PM   Modules accepted: Orders

## 2023-06-12 ENCOUNTER — Encounter: Payer: Self-pay | Admitting: *Deleted

## 2023-06-12 ENCOUNTER — Ambulatory Visit (HOSPITAL_COMMUNITY): Payer: Medicare HMO

## 2023-06-12 DIAGNOSIS — M545 Low back pain, unspecified: Secondary | ICD-10-CM | POA: Diagnosis not present

## 2023-06-12 DIAGNOSIS — M5459 Other low back pain: Secondary | ICD-10-CM | POA: Diagnosis not present

## 2023-06-12 DIAGNOSIS — M6281 Muscle weakness (generalized): Secondary | ICD-10-CM | POA: Diagnosis not present

## 2023-06-12 DIAGNOSIS — M25661 Stiffness of right knee, not elsewhere classified: Secondary | ICD-10-CM | POA: Diagnosis not present

## 2023-06-12 DIAGNOSIS — R262 Difficulty in walking, not elsewhere classified: Secondary | ICD-10-CM | POA: Diagnosis not present

## 2023-06-12 NOTE — Therapy (Signed)
[facility] OUTPATIENT PHYSICAL THERAPY EVALUATION TREATMENT   Patient Name: Monique Warren MRN: [id number] DOB:August 12, 1951, 72 y.o., female Today's Date: 06/12/2023  END OF SESSION:   PT End of Session - 06/12/23 1021     Visit Number 2    Number of Visits 8    Date for PT Re-Evaluation 07/28/23    Authorization Type [employer]    Authorization Time Period Cert dates: 4/09 - 07/28/23    Authorization - Number of Visits 8    Progress Note Due on Visit 8    PT Start Time 1015    PT Stop Time 1055    PT Time Calculation (min) 40 min    Activity Tolerance Patient tolerated treatment well    Behavior During Therapy WFL for tasks assessed/performed               Past Medical History:  Diagnosis Date   Anxiety    Arthritis    Asthma    Celiac disease    Depression    Diabetes mellitus, type II (HCC)    Diabetic neuropathy (HCC)    Diastolic dysfunction    Grade 1 with preserved EF   Dysrhythmia    GERD (gastroesophageal reflux disease)    Heart murmur    Hiatal hernia    Sliding   Hyperlipidemia    Hypertension    Migraine    Obstructive sleep apnea    Osteoporosis    Vertigo    Past Surgical History:  Procedure Laterality Date   ABDOMINAL HYSTERECTOMY  09/29/2003   APPENDECTOMY     BALLOON DILATION N/A 02/08/2022   Procedure: BALLOON DILATION;  Surgeon: Lanelle Bal, DO;  [facility];  Service: Endoscopy;  Laterality: N/A;   BIOPSY  02/08/2022   Procedure: BIOPSY;  Surgeon: Lanelle Bal, DO;  [facility];  Service: Endoscopy;;   BREAST BIOPSY Left    CARPAL TUNNEL RELEASE     CATARACT EXTRACTION Bilateral    October and November 2016   COLONOSCOPY  09/2017   Blake Divine, MD in [place]; 5 mm tubular adenoma in the descending colon s/p resected, mild sigmoid diverticulosis, internal hemorrhoids.  Recommended repeat colonoscopy in 5 years.   CRANIOTOMY FOR ANEURYSM / VERTEBROBASILAR / CAROTID CIRCULATION Right 10/24/2015    ESOPHAGOGASTRODUODENOSCOPY  09/2017   [name]; irregular Z-line s/p biopsy, decreased LES tone, 4 cm hiatal hernia, erythema in gastric antrum biopsied, normal examined duodenum biopsied.  Esophageal biopsy suggestive of reflux, no Barrett's, gastric biopsy with nonspecific chronic gastritis with intestinal metaplasia, duodenal biopsy with increased intraepithelial lymphocytes without blunting of villi, nonspecific.   ESOPHAGOGASTRODUODENOSCOPY (EGD) WITH PROPOFOL N/A 02/08/2022   Surgeon: Lanelle Bal, DO; 2 cm hiatal hernia, short segment Barrett's esophagus without dysplasia, mild Schatzki's ring dilated, gastritis with biopsies benign, normal examined duodenum.  Repeat EGD in 5 years.   KNEE ARTHROSCOPY WITH MEDIAL MENISECTOMY Left 04/09/2022   Procedure: KNEE ARTHROSCOPY WITH PARTIAL MEDIAL MENISCECTOMY, LATERAL MENISCAL DEBRIDEMENT;  Surgeon: Vickki Hearing, MD;  Location: [facility];  Service: Orthopedics;  Laterality: Left;   KNEE ARTHROSCOPY WITH MENISCAL REPAIR Left 04/09/2022   Procedure: KNEE ARTHROSCOPY WITH MEDIAL MENISCAL REPAIR;  Surgeon: Vickki Hearing, MD;  Location: [facility];  Service: Orthopedics;  Laterality: Left;   LAPAROSCOPY     TOTAL KNEE ARTHROPLASTY Right 12/06/2022   Procedure: TOTAL KNEE ARTHROPLASTY;  Surgeon: Vickki Hearing, MD;  Location: [facility];  Service: Orthopedics;  Laterality: Right;  [facility] OUTPATIENT PHYSICAL THERAPY EVALUATION TREATMENT   Patient Name: Monique Warren MRN: [id number] DOB:August 12, 1951, 72 y.o., female Today's Date: 06/12/2023  END OF SESSION:   PT End of Session - 06/12/23 1021     Visit Number 2    Number of Visits 8    Date for PT Re-Evaluation 07/28/23    Authorization Type [employer]    Authorization Time Period Cert dates: 4/09 - 07/28/23    Authorization - Number of Visits 8    Progress Note Due on Visit 8    PT Start Time 1015    PT Stop Time 1055    PT Time Calculation (min) 40 min    Activity Tolerance Patient tolerated treatment well    Behavior During Therapy WFL for tasks assessed/performed               Past Medical History:  Diagnosis Date   Anxiety    Arthritis    Asthma    Celiac disease    Depression    Diabetes mellitus, type II (HCC)    Diabetic neuropathy (HCC)    Diastolic dysfunction    Grade 1 with preserved EF   Dysrhythmia    GERD (gastroesophageal reflux disease)    Heart murmur    Hiatal hernia    Sliding   Hyperlipidemia    Hypertension    Migraine    Obstructive sleep apnea    Osteoporosis    Vertigo    Past Surgical History:  Procedure Laterality Date   ABDOMINAL HYSTERECTOMY  09/29/2003   APPENDECTOMY     BALLOON DILATION N/A 02/08/2022   Procedure: BALLOON DILATION;  Surgeon: Lanelle Bal, DO;  [facility];  Service: Endoscopy;  Laterality: N/A;   BIOPSY  02/08/2022   Procedure: BIOPSY;  Surgeon: Lanelle Bal, DO;  [facility];  Service: Endoscopy;;   BREAST BIOPSY Left    CARPAL TUNNEL RELEASE     CATARACT EXTRACTION Bilateral    October and November 2016   COLONOSCOPY  09/2017   Blake Divine, MD in [place]; 5 mm tubular adenoma in the descending colon s/p resected, mild sigmoid diverticulosis, internal hemorrhoids.  Recommended repeat colonoscopy in 5 years.   CRANIOTOMY FOR ANEURYSM / VERTEBROBASILAR / CAROTID CIRCULATION Right 10/24/2015    ESOPHAGOGASTRODUODENOSCOPY  09/2017   [name]; irregular Z-line s/p biopsy, decreased LES tone, 4 cm hiatal hernia, erythema in gastric antrum biopsied, normal examined duodenum biopsied.  Esophageal biopsy suggestive of reflux, no Barrett's, gastric biopsy with nonspecific chronic gastritis with intestinal metaplasia, duodenal biopsy with increased intraepithelial lymphocytes without blunting of villi, nonspecific.   ESOPHAGOGASTRODUODENOSCOPY (EGD) WITH PROPOFOL N/A 02/08/2022   Surgeon: Lanelle Bal, DO; 2 cm hiatal hernia, short segment Barrett's esophagus without dysplasia, mild Schatzki's ring dilated, gastritis with biopsies benign, normal examined duodenum.  Repeat EGD in 5 years.   KNEE ARTHROSCOPY WITH MEDIAL MENISECTOMY Left 04/09/2022   Procedure: KNEE ARTHROSCOPY WITH PARTIAL MEDIAL MENISCECTOMY, LATERAL MENISCAL DEBRIDEMENT;  Surgeon: Vickki Hearing, MD;  Location: [facility];  Service: Orthopedics;  Laterality: Left;   KNEE ARTHROSCOPY WITH MENISCAL REPAIR Left 04/09/2022   Procedure: KNEE ARTHROSCOPY WITH MEDIAL MENISCAL REPAIR;  Surgeon: Vickki Hearing, MD;  Location: [facility];  Service: Orthopedics;  Laterality: Left;   LAPAROSCOPY     TOTAL KNEE ARTHROPLASTY Right 12/06/2022   Procedure: TOTAL KNEE ARTHROPLASTY;  Surgeon: Vickki Hearing, MD;  Location: [facility];  Service: Orthopedics;  Laterality: Right;  UTERINE FIBROID SURGERY     WRIST SURGERY Bilateral    Patient Active Problem List   Diagnosis Date Noted   COVID-19 05/02/2023   Primary osteoarthritis of right knee 12/06/2022   Status post total right knee replacement 12/06/2022   Chronic fatigue 10/02/2022   Acute pain of right shoulder 07/05/2022   S/P left knee arthroscopy 04/09/22 04/15/2022   Acute medial meniscus tear of left knee    Tear of lateral meniscus of left knee    Encounter for general adult medical examination with abnormal findings 04/01/2022   Dysphagia 01/16/2022   Gastroesophageal  reflux disease 01/16/2022   Barrett's esophagus without dysplasia 01/16/2022   Chronic idiopathic constipation 01/16/2022   Elevated LFTs 01/16/2022   Fatty liver 01/16/2022   History of colonic polyps 01/16/2022   Constipation 10/02/2021   Bilateral thumb pain 08/14/2021   Chest pain 07/02/2021   Hernia of abdominal cavity 06/12/2021   Vertigo 06/12/2021   Carpal tunnel syndrome of right wrist 05/16/2021   Polyneuropathy due to type 2 diabetes mellitus (HCC) 05/16/2021   MDD (major depressive disorder), recurrent episode (HCC) 04/19/2021   HTN (hypertension) 04/19/2021   BPPV (benign paroxysmal positional vertigo) 09/25/2019   Celiac disease 09/25/2019   Obstructive sleep apnea 09/25/2019   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   Hyperlipidemia 10/16/2015   Arthritis 09/27/2015   Asthma 09/27/2015   Migraine without aura or status migrainosus 09/27/2015   Other intervertebral disc degeneration, lumbosacral region 01/26/2014   Neck pain 12/31/2013   Osteoporosis 09/16/2008   Type 2 diabetes mellitus with diabetic neuropathy, unspecified (HCC) 09/16/2008   Sliding hiatal hernia 12/11/1950    PCP: Anabel Halon, MDRef Provider (PCP)   REFERRING PROVIDER:   Vickki Hearing, MD    REFERRING DIAG: M54.50 (ICD-10-CM) - Lumbar pain   Rationale for Evaluation and Treatment: Rehabilitation  THERAPY DIAG:  No diagnosis found.  ONSET DATE: Chronic 30+ yrs --------------------------------------------------------------------------------------------- SUBJECTIVE:                                                                                                                                                                                           SUBJECTIVE STATEMENT: Patient has had chronic LBP for 30+ years. Patient had right TKR March 2024 and finished rehab here at [facility]. Patient went to MD for right knee f/u on 05/12/23 and was referred to OPPT for LBP this  time around. Patient also has had GI issues at the moment and will have GI appointment.    PERTINENT HISTORY:  Afib- medicated Right TKR  Celiac *Vertigo- keep head elevated in supine;   *  [facility] OUTPATIENT PHYSICAL THERAPY EVALUATION TREATMENT   Patient Name: Monique Warren MRN: [id number] DOB:August 12, 1951, 72 y.o., female Today's Date: 06/12/2023  END OF SESSION:   PT End of Session - 06/12/23 1021     Visit Number 2    Number of Visits 8    Date for PT Re-Evaluation 07/28/23    Authorization Type [employer]    Authorization Time Period Cert dates: 4/09 - 07/28/23    Authorization - Number of Visits 8    Progress Note Due on Visit 8    PT Start Time 1015    PT Stop Time 1055    PT Time Calculation (min) 40 min    Activity Tolerance Patient tolerated treatment well    Behavior During Therapy WFL for tasks assessed/performed               Past Medical History:  Diagnosis Date   Anxiety    Arthritis    Asthma    Celiac disease    Depression    Diabetes mellitus, type II (HCC)    Diabetic neuropathy (HCC)    Diastolic dysfunction    Grade 1 with preserved EF   Dysrhythmia    GERD (gastroesophageal reflux disease)    Heart murmur    Hiatal hernia    Sliding   Hyperlipidemia    Hypertension    Migraine    Obstructive sleep apnea    Osteoporosis    Vertigo    Past Surgical History:  Procedure Laterality Date   ABDOMINAL HYSTERECTOMY  09/29/2003   APPENDECTOMY     BALLOON DILATION N/A 02/08/2022   Procedure: BALLOON DILATION;  Surgeon: Lanelle Bal, DO;  [facility];  Service: Endoscopy;  Laterality: N/A;   BIOPSY  02/08/2022   Procedure: BIOPSY;  Surgeon: Lanelle Bal, DO;  [facility];  Service: Endoscopy;;   BREAST BIOPSY Left    CARPAL TUNNEL RELEASE     CATARACT EXTRACTION Bilateral    October and November 2016   COLONOSCOPY  09/2017   Blake Divine, MD in [place]; 5 mm tubular adenoma in the descending colon s/p resected, mild sigmoid diverticulosis, internal hemorrhoids.  Recommended repeat colonoscopy in 5 years.   CRANIOTOMY FOR ANEURYSM / VERTEBROBASILAR / CAROTID CIRCULATION Right 10/24/2015    ESOPHAGOGASTRODUODENOSCOPY  09/2017   [name]; irregular Z-line s/p biopsy, decreased LES tone, 4 cm hiatal hernia, erythema in gastric antrum biopsied, normal examined duodenum biopsied.  Esophageal biopsy suggestive of reflux, no Barrett's, gastric biopsy with nonspecific chronic gastritis with intestinal metaplasia, duodenal biopsy with increased intraepithelial lymphocytes without blunting of villi, nonspecific.   ESOPHAGOGASTRODUODENOSCOPY (EGD) WITH PROPOFOL N/A 02/08/2022   Surgeon: Lanelle Bal, DO; 2 cm hiatal hernia, short segment Barrett's esophagus without dysplasia, mild Schatzki's ring dilated, gastritis with biopsies benign, normal examined duodenum.  Repeat EGD in 5 years.   KNEE ARTHROSCOPY WITH MEDIAL MENISECTOMY Left 04/09/2022   Procedure: KNEE ARTHROSCOPY WITH PARTIAL MEDIAL MENISCECTOMY, LATERAL MENISCAL DEBRIDEMENT;  Surgeon: Vickki Hearing, MD;  Location: [facility];  Service: Orthopedics;  Laterality: Left;   KNEE ARTHROSCOPY WITH MENISCAL REPAIR Left 04/09/2022   Procedure: KNEE ARTHROSCOPY WITH MEDIAL MENISCAL REPAIR;  Surgeon: Vickki Hearing, MD;  Location: [facility];  Service: Orthopedics;  Laterality: Left;   LAPAROSCOPY     TOTAL KNEE ARTHROPLASTY Right 12/06/2022   Procedure: TOTAL KNEE ARTHROPLASTY;  Surgeon: Vickki Hearing, MD;  Location: [facility];  Service: Orthopedics;  Laterality: Right;

## 2023-06-13 ENCOUNTER — Telehealth: Payer: Self-pay | Admitting: *Deleted

## 2023-06-13 NOTE — Telephone Encounter (Signed)
Pt's rabeprazole was denied for twice a day. You need to do an appeal letter for pt to received 2 a day.

## 2023-06-14 NOTE — Telephone Encounter (Signed)
Verbal appeal filed 9/27.

## 2023-06-16 ENCOUNTER — Encounter: Payer: Self-pay | Admitting: Internal Medicine

## 2023-06-16 ENCOUNTER — Ambulatory Visit (INDEPENDENT_AMBULATORY_CARE_PROVIDER_SITE_OTHER): Payer: Medicare HMO | Admitting: Internal Medicine

## 2023-06-16 VITALS — BP 126/73 | HR 81 | Ht 62.0 in | Wt 186.2 lb

## 2023-06-16 DIAGNOSIS — L239 Allergic contact dermatitis, unspecified cause: Secondary | ICD-10-CM | POA: Diagnosis not present

## 2023-06-16 DIAGNOSIS — E782 Mixed hyperlipidemia: Secondary | ICD-10-CM | POA: Diagnosis not present

## 2023-06-16 DIAGNOSIS — E559 Vitamin D deficiency, unspecified: Secondary | ICD-10-CM | POA: Diagnosis not present

## 2023-06-16 DIAGNOSIS — H8110 Benign paroxysmal vertigo, unspecified ear: Secondary | ICD-10-CM

## 2023-06-16 DIAGNOSIS — Z23 Encounter for immunization: Secondary | ICD-10-CM | POA: Diagnosis not present

## 2023-06-16 DIAGNOSIS — M7552 Bursitis of left shoulder: Secondary | ICD-10-CM | POA: Insufficient documentation

## 2023-06-16 DIAGNOSIS — G43009 Migraine without aura, not intractable, without status migrainosus: Secondary | ICD-10-CM | POA: Diagnosis not present

## 2023-06-16 DIAGNOSIS — E1140 Type 2 diabetes mellitus with diabetic neuropathy, unspecified: Secondary | ICD-10-CM | POA: Diagnosis not present

## 2023-06-16 DIAGNOSIS — I10 Essential (primary) hypertension: Secondary | ICD-10-CM | POA: Diagnosis not present

## 2023-06-16 DIAGNOSIS — Z0001 Encounter for general adult medical examination with abnormal findings: Secondary | ICD-10-CM

## 2023-06-16 DIAGNOSIS — K900 Celiac disease: Secondary | ICD-10-CM

## 2023-06-16 DIAGNOSIS — H538 Other visual disturbances: Secondary | ICD-10-CM | POA: Diagnosis not present

## 2023-06-16 MED ORDER — TRAMADOL HCL 50 MG PO TABS
50.0000 mg | ORAL_TABLET | Freq: Two times a day (BID) | ORAL | 0 refills | Status: DC | PRN
Start: 2023-06-16 — End: 2023-11-06

## 2023-06-16 MED ORDER — MECLIZINE HCL 25 MG PO TABS: 25.0000 mg | ORAL_TABLET | Freq: Three times a day (TID) | ORAL | 0 refills | Status: AC | PRN

## 2023-06-16 MED ORDER — TRIAMCINOLONE ACETONIDE 0.1 % EX CREA
1.0000 | TOPICAL_CREAM | Freq: Two times a day (BID) | CUTANEOUS | 0 refills | Status: AC
Start: 2023-06-16 — End: ?

## 2023-06-16 NOTE — Assessment & Plan Note (Signed)
On Aimovig, refilled for now Zolmitriptan as needed for breakthrough headache Her neck pain and occipital area headache is likely from migraine Used to follow-up with neurology in [place] Referred to Neurology

## 2023-06-16 NOTE — Assessment & Plan Note (Addendum)
Lab Results  Component Value Date   HGBA1C 5.7 (A) 03/10/2023   Associated with HTN, GERD and depression Well controlled now On Ozempic 2 mg QW, followed by Endocrinology On statin and ACEi Diabetic eye exam: Referred to Ophthalmology On Cymbalta for neuropathy Had added gabapentin for chronic pain

## 2023-06-16 NOTE — Assessment & Plan Note (Signed)
Rash over neck and upper chest wall area likely due to allergic dermatitis Prescribed Kenalog cream

## 2023-06-16 NOTE — Progress Notes (Signed)
Established Patient Office Visit  Subjective:  Patient ID: Monique Warren, female    DOB: 1950/11/17  Age: 72 y.o. MRN: [id number]  CC:  Chief Complaint  Patient presents with   Annual Exam   Blurred Vision    Blurry vision all the time    Dizziness    Vertigo is present at all times    HPI Monique… Warren is a 72 y.o. female with past medical history of hypertension, cerebral artery aneurysm s/p clip, migraine, asthma, OSA, celiac disease, GERD, type II DM, osteoporosis, depression with anxiety and obesity who presents for annual physical.  She is having dizziness with position change.  She also reports nausea.  She was given meclizine for dizziness, but has not been taking it recently.  She also reports blurry vision, worse at nighttime.  She has had cataract surgeries many years ago.  HTN: BP is wnl today. Takes medications regularly. Patient denies headache, chest pain, dyspnea or palpitations.  Her lisinopril has been stopped due to hypotensive episodes.   Type II DM: Her HbA1C was 5.7 in 06/24. Her Ozempic dose was increased by endocrinology to 2 mg qw.  Her blood glucose remains around 100-120 most of the time.  She denies any polyuria or polydipsia.  MDD: She takes Cymbalta 60 mg daily.  She was placed on Effexor instead of Wellbutrin.  She has been tolerating it better.  She reports improvement in her apathy and concentration with it. Denies any SI or HI currently.  Migraine: Takes Aimovig and feels better now. Takes zolmitriptan as needed for breakthrough headache.  Chronic fatigue: She has chronic fatigue, worse for the past few months.  Denies any fever, chills, chronic cough, recent weight loss, night sweats or LAD.  She has OSA, likely untreated.  She was also on beta-blocker for tachycardia in addition to Cardizem, but it has been discontinued now.  She also complains of chronic left shoulder pain.  She has seen Dr. Romeo Apple for it. She has had subacromial steroid  injection for bursitis, which did not provide relief for more than a week.  Past Medical History:  Diagnosis Date   Anxiety    Arthritis    Asthma    Celiac disease    Depression    Diabetes mellitus, type II (HCC)    Diabetic neuropathy (HCC)    Diastolic dysfunction    Grade 1 with preserved EF   Dysrhythmia    GERD (gastroesophageal reflux disease)    Heart murmur    Hiatal hernia    Sliding   Hyperlipidemia    Hypertension    Migraine    Obstructive sleep apnea    Osteoporosis    Vertigo     Past Surgical History:  Procedure Laterality Date   ABDOMINAL HYSTERECTOMY  09/29/2003   APPENDECTOMY     BALLOON DILATION N/A 02/08/2022   Procedure: BALLOON DILATION;  Surgeon: Lanelle Bal, DO;  [facility];  Service: Endoscopy;  Laterality: N/A;   BIOPSY  02/08/2022   Procedure: BIOPSY;  Surgeon: Lanelle Bal, DO;  [facility]: [facility];  Service: Endoscopy;;   BREAST BIOPSY Left    CARPAL TUNNEL RELEASE     CATARACT EXTRACTION Bilateral    October and November 2016   COLONOSCOPY  09/2017   Blake Divine, MD in [place]; 5 mm tubular adenoma in the descending colon s/p resected, mild sigmoid diverticulosis, internal hemorrhoids.  Recommended repeat colonoscopy in 5 years.   CRANIOTOMY  FOR ANEURYSM / VERTEBROBASILAR / CAROTID CIRCULATION Right 10/24/2015   ESOPHAGOGASTRODUODENOSCOPY  09/2017   [name]; irregular Z-line s/p biopsy, decreased LES tone, 4 cm hiatal hernia, erythema in gastric antrum biopsied, normal examined duodenum biopsied.  Esophageal biopsy suggestive of reflux, no Barrett's, gastric biopsy with nonspecific chronic gastritis with intestinal metaplasia, duodenal biopsy with increased intraepithelial lymphocytes without blunting of villi, nonspecific.   ESOPHAGOGASTRODUODENOSCOPY (EGD) WITH PROPOFOL N/A 02/08/2022   Surgeon: Lanelle Bal, DO; 2 cm hiatal hernia, short segment Barrett's esophagus without dysplasia, mild Schatzki's ring  dilated, gastritis with biopsies benign, normal examined duodenum.  Repeat EGD in 5 years.   KNEE ARTHROSCOPY WITH MEDIAL MENISECTOMY Left 04/09/2022   Procedure: KNEE ARTHROSCOPY WITH PARTIAL MEDIAL MENISCECTOMY, LATERAL MENISCAL DEBRIDEMENT;  Surgeon: Vickki Hearing, MD;  Location: [facility];  Service: Orthopedics;  Laterality: Left;   KNEE ARTHROSCOPY WITH MENISCAL REPAIR Left 04/09/2022   Procedure: KNEE ARTHROSCOPY WITH MEDIAL MENISCAL REPAIR;  Surgeon: Vickki Hearing, MD;  Location: [facility];  Service: Orthopedics;  Laterality: Left;   LAPAROSCOPY     TOTAL KNEE ARTHROPLASTY Right 12/06/2022   Procedure: TOTAL KNEE ARTHROPLASTY;  Surgeon: Vickki Hearing, MD;  Location: [facility];  Service: Orthopedics;  Laterality: Right;   UTERINE FIBROID SURGERY     WRIST SURGERY Bilateral     Family History  Problem Relation Age of Onset   Breast cancer Mother    Thyroid disease Mother    Stroke Mother    Hypertension Father    Hyperlipidemia Father    Heart attack Father    Heart failure Father    Breast cancer Paternal Grandmother    Cancer - Colon Neg Hx    Gastric cancer Neg Hx    Esophageal cancer Neg Hx    Liver cancer Neg Hx    Autoimmune disease Neg Hx     Social History   Socioeconomic History   Marital status: Married    Spouse name: Not on file   Number of children: Not on file   Years of education: Not on file   Highest education level: GED or equivalent  Occupational History   Not on file  Tobacco Use   Smoking status: Former    Current packs/day: 0.50    Types: Cigarettes   Smokeless tobacco: Never  Vaping Use   Vaping status: Never Used  Substance and Sexual Activity   Alcohol use: Yes    Alcohol/week: 4.0 standard drinks of alcohol    Types: 2 Glasses of wine, 2 Shots of liquor per week    Comment: rarely   Drug use: Never   Sexual activity: Not on file  Other Topics Concern   Not on file  Social History Narrative   Not on file   Social  Determinants of Health   Financial Resource Strain: Low Risk  (01/01/2023)   Overall Financial Resource Strain (CARDIA)    Difficulty of Paying Living Expenses: Not hard at all  Food Insecurity: No Food Insecurity (01/01/2023)   Hunger Vital Sign    Worried About Running Out of Food in the Last Year: Never true    Ran Out of Food in the Last Year: Never true  Transportation Needs: No Transportation Needs (01/01/2023)   PRAPARE - Administrator, Civil Service (Medical): No    Lack of Transportation (Non-Medical): No  Physical Activity: Unknown (01/01/2023)   Exercise Vital Sign    Days of Exercise per Week: 0 days  Minutes of Exercise per Session: Not on file  Stress: Stress Concern Present (01/01/2023)   [employer] of Occupational Health - Occupational Stress Questionnaire    Feeling of Stress : To some extent  Social Connections: Moderately Integrated (01/01/2023)   Social Connection and Isolation Panel [NHANES]    Frequency of Communication with Friends and Family: More than three times a week    Frequency of Social Gatherings with Friends and Family: More than three times a week    Attends Religious Services: 1 to 4 times per year    Active Member of [employer] or Organizations: No    Attends Banker Meetings: Not on file    Marital Status: Married  Catering manager Violence: Not At Risk (12/06/2022)   Humiliation, Afraid, Rape, and Kick questionnaire    Fear of Current or Ex-Partner: No    Emotionally Abused: No    Physically Abused: No    Sexually Abused: No    Outpatient Medications Prior to Visit  Medication Sig Dispense Refill   acetaminophen (TYLENOL) 500 MG tablet Take 1 tablet (500 mg total) by mouth every 6 (six) hours as needed for moderate pain. 30 tablet 0   AIMOVIG 140 MG/ML SOAJ INJECT 140 MG UNDER THE SKIN EVERY 30 DAYS 1 mL 11   albuterol (VENTOLIN HFA) 108 (90 Base) MCG/ACT inhaler USE 2 INHALATIONS EVERY 6 HOURS AS NEEDED FOR WHEEZING  OR SHORTNESS OF BREATH 8.5 g 10   atorvastatin (LIPITOR) 40 MG tablet TAKE 1 TABLET AT BEDTIME 90 tablet 3   Blood Glucose Monitoring Suppl (ONE TOUCH ULTRA 2) w/Device KIT USE TO CHECK BLOOD GLUCOSE 4 TIMES A DAY 1 kit 0   Cholecalciferol (VITAMIN D3) 125 MCG (5000 UT) capsule Take 5,000 Units by mouth daily.     clindamycin (CLEOCIN) 150 MG capsule Take 1 - 30 min prior to dental work 5 capsule 1   diltiazem (CARDIZEM SR) 120 MG 12 hr capsule TAKE 2 CAPSULES DAILY 180 capsule 3   docusate sodium (COLACE) 100 MG capsule Take 1 capsule (100 mg total) by mouth 2 (two) times daily. (Patient not taking: Reported on 06/11/2023) 10 capsule 0   DULoxetine (CYMBALTA) 60 MG capsule Take 1 capsule (60 mg total) by mouth daily. 90 capsule 3   Estradiol 10 MCG TABS vaginal tablet Place 10 mcg vaginally every Monday.     famotidine (PEPCID) 40 MG tablet Take 1 tablet (40 mg total) by mouth daily. 90 tablet 3   Fluticasone-Salmeterol,sensor, (AIRDUO DIGIHALER) 232-14 MCG/ACT AEPB Inhale 1 Inhalation into the lungs 2 (two) times daily. USE 1 INHALATION TWICE A DAY Strength: 232-14 MCG/ACT 1 each 11   gabapentin (NEURONTIN) 300 MG capsule Take 1 capsule (300 mg total) by mouth at bedtime. 90 capsule 3   Lancets (ONETOUCH DELICA PLUS LANCET33G) MISC Use to check glucose once daily (Patient not taking: Reported on 06/11/2023) 100 each 6   linaclotide (LINZESS) 72 MCG capsule Take 1 capsule (72 mcg total) by mouth daily before breakfast. 30 capsule 3   Multiple Vitamin (MULTIVITAMIN WITH MINERALS) TABS tablet Take 1 tablet by mouth daily.     ONETOUCH ULTRA test strip Use to monitor glucose once daily 100 each 6   PROLIA 60 MG/ML SOSY injection INJECT 60 MG UNDER THE SKIN EVERY 6 MONTHS 1 mL 1   promethazine (PHENERGAN) 12.5 MG tablet Take 1 tablet (12.5 mg total) by mouth every 6 (six) hours as needed for nausea or vomiting. 30 tablet 0  RABEprazole (ACIPHEX) 20 MG tablet Take 1 tablet (20 mg total) by mouth 2  (two) times daily before a meal. 60 tablet 3   Semaglutide, 2 MG/DOSE, 8 MG/3ML SOPN Inject 2 mg as directed once a week. 6 mL 3   venlafaxine (EFFEXOR) 37.5 MG tablet TAKE 1 TABLET TWICE A DAY 60 tablet 3   zolmitriptan (ZOMIG) 5 MG tablet Take 5 mg by mouth daily as needed for migraine.     traMADol (ULTRAM) 50 MG tablet Take 1 tablet (50 mg total) by mouth every 6 (six) hours. (Patient not taking: Reported on 06/11/2023) 30 tablet 0   Facility-Administered Medications Prior to Visit  Medication Dose Route Frequency Provider Last Rate Last Admin   bupivacaine-meloxicam ER (ZYNRELEF) injection 400 mg  400 mg Infiltration Once Vickki Hearing, MD       bupivacaine-meloxicam ER (ZYNRELEF) injection 400 mg  400 mg Infiltration Once Vickki Hearing, MD        Allergies  Allergen Reactions   Cefuroxime Anaphylaxis, Hives, Shortness Of Breath, Swelling and Anxiety   Codeine Hives, Shortness Of Breath, Nausea And Vomiting, Swelling and Anxiety   Sulfa Antibiotics Anaphylaxis, Hives, Nausea And Vomiting and Swelling    Swollen throat   Sulfamethoxazole-Trimethoprim Shortness Of Breath, Swelling and Anxiety    Throat swelling    Gluten Meal     Severe reflux - celiac disease    ROS Review of Systems  Constitutional:  Positive for fatigue. Negative for chills and fever.  HENT:  Negative for congestion, sinus pressure, sinus pain and sore throat.   Eyes:  Positive for visual disturbance. Negative for pain and discharge.  Respiratory:  Negative for cough and shortness of breath.   Cardiovascular:  Negative for chest pain and palpitations.  Gastrointestinal:  Positive for constipation. Negative for abdominal pain, diarrhea, nausea and vomiting.  Endocrine: Negative for polydipsia and polyuria.  Genitourinary:  Negative for dysuria and hematuria.  Musculoskeletal:  Positive for arthralgias (Left knee, left shoulder, elbow and hand), back pain and neck pain. Negative for neck stiffness.   Skin:  Positive for rash.  Neurological:  Positive for dizziness and weakness (Right hand).  Psychiatric/Behavioral:  Positive for dysphoric mood and sleep disturbance. Negative for agitation and behavioral problems.       Objective:    Physical Exam Vitals reviewed.  Constitutional:      General: She is not in acute distress.    Appearance: She is obese. She is not diaphoretic.  HENT:     Head: Normocephalic and atraumatic.     Nose: Nose normal.     Mouth/Throat:     Mouth: Mucous membranes are moist.  Eyes:     General: No scleral icterus.    Extraocular Movements: Extraocular movements intact.  Cardiovascular:     Rate and Rhythm: Normal rate and regular rhythm.     Pulses: Normal pulses.     Heart sounds: Murmur (Systolic over right upper sternal border) heard.  Pulmonary:     Breath sounds: Normal breath sounds. No wheezing or rales.  Musculoskeletal:     Left shoulder: Decreased range of motion. Decreased strength.     Cervical back: Neck supple. No tenderness.     Right lower leg: No edema.     Left lower leg: No edema.     Comments: S/p right TKA, incision C/D/I  Skin:    General: Skin is warm.     Findings: No rash.     Comments: Papules  over erythematous base over neck and upper chest wall area  Neurological:     General: No focal deficit present.     Mental Status: She is alert and oriented to person, place, and time.     Cranial Nerves: No cranial nerve deficit.     Sensory: No sensory deficit.     Motor: No weakness.  Psychiatric:        Mood and Affect: Mood normal.        Behavior: Behavior normal.     BP 126/73 (BP Location: Right Arm, Patient Position: Sitting, Cuff Size: Normal)   Pulse 81   Ht 5\' 2"  (1.575 m)   Wt 186 lb 3.2 oz (84.5 kg)   SpO2 93%   BMI 34.06 kg/m  Wt Readings from Last 3 Encounters:  06/16/23 186 lb 3.2 oz (84.5 kg)  06/11/23 184 lb 9.6 oz (83.7 kg)  05/12/23 184 lb (83.5 kg)    Lab Results  Component Value Date    TSH 1.780 06/05/2022   Lab Results  Component Value Date   WBC 15.2 (H) 12/08/2022   HGB 12.8 12/08/2022   HCT 38.8 12/08/2022   MCV 98.2 12/08/2022   PLT 299 12/08/2022   Lab Results  Component Value Date   NA 136 12/07/2022   K 4.3 12/07/2022   CO2 21 (L) 12/07/2022   GLUCOSE 181 (H) 12/07/2022   BUN 13 12/07/2022   CREATININE 0.82 12/07/2022   BILITOT 0.4 06/05/2022   ALKPHOS 86 06/05/2022   AST 18 06/05/2022   ALT 21 06/05/2022   PROT 6.1 06/05/2022   ALBUMIN 4.1 06/05/2022   CALCIUM 7.8 (L) 12/07/2022   ANIONGAP 8 12/07/2022   EGFR 84 06/05/2022   Lab Results  Component Value Date   CHOL 246 (H) 06/05/2022   Lab Results  Component Value Date   HDL 41 06/05/2022   Lab Results  Component Value Date   LDLCALC 174 (H) 06/05/2022   Lab Results  Component Value Date   TRIG 165 (H) 06/05/2022   Lab Results  Component Value Date   CHOLHDL 6.0 (H) 06/05/2022   Lab Results  Component Value Date   HGBA1C 5.7 (A) 03/10/2023      Assessment & Plan:   Problem List Items Addressed This Visit       Cardiovascular and Mediastinum   Migraine without aura or status migrainosus    On Aimovig, refilled for now Zolmitriptan as needed for breakthrough headache Her neck pain and occipital area headache is likely from migraine Used to follow-up with neurology in [place] Referred to Neurology      Relevant Medications   traMADol (ULTRAM) 50 MG tablet   Other Relevant Orders   CMP14+EGFR   CBC with Differential/Platelet   HTN (hypertension)    BP Readings from Last 1 Encounters:  06/16/23 126/73   Well-controlled with Cardizem Had to stop lisinopril due to hypotension in the past Counseled for compliance with the medications Advised DASH diet and moderate exercise/walking, at least 150 mins/week      Relevant Orders   TSH   CMP14+EGFR   CBC with Differential/Platelet     Digestive   Celiac disease    Referred to GI On Rabeprazole for  GERD Needs to get transglutaminase and IgA test done, ordered as per GI recommendation      Relevant Orders   IgA   Tissue transglutaminase, IgA     Endocrine   Type 2 diabetes mellitus with diabetic neuropathy,  unspecified (HCC)    Lab Results  Component Value Date   HGBA1C 5.7 (A) 03/10/2023   Associated with HTN, GERD and depression Well controlled now On Ozempic 2 mg QW, followed by Endocrinology On statin and ACEi Diabetic eye exam: Referred to Ophthalmology On Cymbalta for neuropathy Had added gabapentin for chronic pain      Relevant Orders   Hemoglobin A1c   CMP14+EGFR   Urine Microalbumin w/creat. ratio     Nervous and Auditory   BPPV (benign paroxysmal positional vertigo)    Her dizziness and vomiting likely due to BPPV Meclizine as needed, needs to take 25 mg as needed Avoid sudden positional changes Maintain adequate hydration and eat at regular intervals Advised to perform vestibular exercises at home      Relevant Medications   meclizine (ANTIVERT) 25 MG tablet     Musculoskeletal and Integument   Bursitis of left shoulder    Status steroid injection in acromial space Unable to take oral NSAIDs due to GERD/Barrett's esophagus Tramadol as needed for severe pain      Relevant Medications   traMADol (ULTRAM) 50 MG tablet   Allergic dermatitis    Rash over neck and upper chest wall area likely due to allergic dermatitis Prescribed Kenalog cream      Relevant Medications   triamcinolone cream (KENALOG) 0.1 %     Other   Hyperlipidemia    On atorvastatin 40 mg QD      Relevant Orders   Lipid panel   Encounter for general adult medical examination with abnormal findings - Primary    Physical exam as documented. Fasting blood tests ordered.      Blurry vision, bilateral    Has blurry vision, worse in dark Referred to Ophthalmology - also needs  diabetic eye exam      Relevant Orders   Ambulatory referral to Ophthalmology   Other Visit  Diagnoses     Vitamin D deficiency       Relevant Orders   VITAMIN D 25 Hydroxy (Vit-D Deficiency, Fractures)   Encounter for immunization       Relevant Orders   Flu Vaccine Trivalent High Dose (Fluad) (Completed)      Meds ordered this encounter  Medications   traMADol (ULTRAM) 50 MG tablet    Sig: Take 1 tablet (50 mg total) by mouth every 12 (twelve) hours as needed.    Dispense:  30 tablet    Refill:  0   meclizine (ANTIVERT) 25 MG tablet    Sig: Take 1 tablet (25 mg total) by mouth 3 (three) times daily as needed for dizziness.    Dispense:  30 tablet    Refill:  0   triamcinolone cream (KENALOG) 0.1 %    Sig: Apply 1 Application topically 2 (two) times daily.    Dispense:  30 g    Refill:  0    Follow-up: Return in about 4 months (around 10/16/2023) for HTN and DM.    Anabel Halon, MD

## 2023-06-16 NOTE — Assessment & Plan Note (Signed)
BP Readings from Last 1 Encounters:  06/16/23 126/73   Well-controlled with Cardizem Had to stop lisinopril due to hypotension in the past Counseled for compliance with the medications Advised DASH diet and moderate exercise/walking, at least 150 mins/week

## 2023-06-16 NOTE — Patient Instructions (Addendum)
Please take Meclizine as needed for dizziness.  Please apply Kenalog cream over rash area.  Please continue to take medications as prescribed.  Please continue to follow low carb diet and perform moderate exercise/walking at least 150 mins/week.  You are being referred to [facility] eye care. [address], [place], [place] [address] [phone]

## 2023-06-16 NOTE — Telephone Encounter (Signed)
Noted. Will address when I return to work next week.

## 2023-06-16 NOTE — Assessment & Plan Note (Addendum)
On atorvastatin 40 mg QD

## 2023-06-16 NOTE — Assessment & Plan Note (Signed)
Has blurry vision, worse in dark Referred to Ophthalmology - also needs  diabetic eye exam

## 2023-06-16 NOTE — Assessment & Plan Note (Signed)
Referred to GI On Rabeprazole for GERD Needs to get transglutaminase and IgA test done, ordered as per GI recommendation

## 2023-06-16 NOTE — Assessment & Plan Note (Signed)
Physical exam as documented. Fasting blood tests ordered. 

## 2023-06-16 NOTE — Assessment & Plan Note (Addendum)
Her dizziness and vomiting likely due to BPPV Meclizine as needed, needs to take 25 mg as needed Avoid sudden positional changes Maintain adequate hydration and eat at regular intervals Advised to perform vestibular exercises at home

## 2023-06-16 NOTE — Telephone Encounter (Signed)
Noted. Another form was faxed over that needs to be filled out. I placed it on your desk.

## 2023-06-16 NOTE — Assessment & Plan Note (Signed)
Status steroid injection in acromial space Unable to take oral NSAIDs due to GERD/Barrett's esophagus Tramadol as needed for severe pain

## 2023-06-18 LAB — CBC WITH DIFFERENTIAL/PLATELET
Basophils Absolute: 0 10*3/uL (ref 0.0–0.2)
Basos: 0 %
EOS (ABSOLUTE): 0 10*3/uL (ref 0.0–0.4)
Eos: 0 %
Hematocrit: 44.2 % (ref 34.0–46.6)
Hemoglobin: 14.8 g/dL (ref 11.1–15.9)
Immature Grans (Abs): 0 10*3/uL (ref 0.0–0.1)
Immature Granulocytes: 0 %
Lymphocytes Absolute: 1.7 10*3/uL (ref 0.7–3.1)
Lymphs: 23 %
MCH: 31.4 pg (ref 26.6–33.0)
MCHC: 33.5 g/dL (ref 31.5–35.7)
MCV: 94 fL (ref 79–97)
Monocytes Absolute: 0.5 10*3/uL (ref 0.1–0.9)
Monocytes: 7 %
Neutrophils Absolute: 5.2 10*3/uL (ref 1.4–7.0)
Neutrophils: 70 %
Platelets: 328 10*3/uL (ref 150–450)
RBC: 4.72 x10E6/uL (ref 3.77–5.28)
RDW: 13 % (ref 11.7–15.4)
WBC: 7.4 10*3/uL (ref 3.4–10.8)

## 2023-06-18 LAB — LIPID PANEL
Chol/HDL Ratio: 4 {ratio} (ref 0.0–4.4)
Cholesterol, Total: 202 mg/dL — ABNORMAL HIGH (ref 100–199)
HDL: 51 mg/dL (ref >39)
LDL Chol Calc (NIH): 127 mg/dL — ABNORMAL HIGH (ref 0–99)
Triglycerides: 137 mg/dL (ref 0–149)
VLDL Cholesterol Cal: 24 mg/dL (ref 5–40)

## 2023-06-18 LAB — CMP14+EGFR
ALT: 22 [IU]/L (ref 0–32)
AST: 19 [IU]/L (ref 0–40)
Albumin: 4.1 g/dL (ref 3.8–4.8)
Alkaline Phosphatase: 94 [IU]/L (ref 44–121)
BUN/Creatinine Ratio: 16 (ref 12–28)
BUN: 14 mg/dL (ref 8–27)
Bilirubin Total: 0.3 mg/dL (ref 0.0–1.2)
CO2: 22 mmol/L (ref 20–29)
Calcium: 9 mg/dL (ref 8.7–10.3)
Chloride: 106 mmol/L (ref 96–106)
Creatinine, Ser: 0.87 mg/dL (ref 0.57–1.00)
Globulin, Total: 2.3 g/dL (ref 1.5–4.5)
Glucose: 102 mg/dL — ABNORMAL HIGH (ref 70–99)
Potassium: 4.6 mmol/L (ref 3.5–5.2)
Sodium: 142 mmol/L (ref 134–144)
Total Protein: 6.4 g/dL (ref 6.0–8.5)
eGFR: 71 mL/min/{1.73_m2} (ref >59)

## 2023-06-18 LAB — VITAMIN D 25 HYDROXY (VIT D DEFICIENCY, FRACTURES): Vit D, 25-Hydroxy: 35 ng/mL (ref 30.0–100.0)

## 2023-06-18 LAB — HEMOGLOBIN A1C
Est. average glucose Bld gHb Est-mCnc: 117 mg/dL
Hgb A1c MFr Bld: 5.7 % — ABNORMAL HIGH (ref 4.8–5.6)

## 2023-06-18 LAB — TISSUE TRANSGLUTAMINASE, IGA

## 2023-06-18 LAB — IGA: IgA/Immunoglobulin A, Serum: 140 mg/dL (ref 64–422)

## 2023-06-18 LAB — TSH: TSH: 1.52 u[IU]/mL (ref 0.450–4.500)

## 2023-06-18 NOTE — Telephone Encounter (Signed)
Great.  Thanks

## 2023-06-18 NOTE — Telephone Encounter (Signed)
Noted. I filled out forms and faxed to [facility]

## 2023-06-19 ENCOUNTER — Encounter (HOSPITAL_COMMUNITY): Payer: Medicare HMO

## 2023-06-24 DIAGNOSIS — H1045 Other chronic allergic conjunctivitis: Secondary | ICD-10-CM | POA: Diagnosis not present

## 2023-06-24 DIAGNOSIS — E119 Type 2 diabetes mellitus without complications: Secondary | ICD-10-CM | POA: Diagnosis not present

## 2023-06-24 DIAGNOSIS — H26493 Other secondary cataract, bilateral: Secondary | ICD-10-CM | POA: Diagnosis not present

## 2023-06-24 DIAGNOSIS — Z961 Presence of intraocular lens: Secondary | ICD-10-CM | POA: Diagnosis not present

## 2023-06-24 DIAGNOSIS — H04123 Dry eye syndrome of bilateral lacrimal glands: Secondary | ICD-10-CM | POA: Diagnosis not present

## 2023-06-24 LAB — HM DIABETES EYE EXAM

## 2023-06-26 ENCOUNTER — Encounter (HOSPITAL_COMMUNITY): Payer: Medicare HMO

## 2023-06-26 ENCOUNTER — Encounter (HOSPITAL_COMMUNITY): Payer: Self-pay

## 2023-06-26 NOTE — Therapy (Signed)
[facility]  PHYSICAL THERAPY DISCHARGE SUMMARY  Visits from Start of Care: 1  Current functional level related to goals / functional outcomes: N/a   Remaining deficits: N/a   Education / Equipment: N/a   Patient agrees to discharge. Patient goals were not met. Patient is being discharged due to  transportation issues. Patient discharged due to No Show Policy    Patient Details  Name: Monique Warren MRN: [id number] Date of Birth: June 23, 1951 Referring Provider:  No ref. provider found  Encounter Date: 06/26/2023   Seymour Bars, PT 06/26/2023, 10:23 AM  [facility] [address] [place], [place], [address] Phone: [phone]   Fax:  [phone]

## 2023-07-03 ENCOUNTER — Encounter (HOSPITAL_COMMUNITY): Payer: Medicare HMO

## 2023-07-08 DIAGNOSIS — Z008 Encounter for other general examination: Secondary | ICD-10-CM | POA: Diagnosis not present

## 2023-07-08 NOTE — Patient Instructions (Signed)
Monique Warren  07/08/2023     @PREFPERIOPPHARMACY @   Your procedure is scheduled on  07/11/2023.   Report to [facility] at  0930  A.M.   Call this number if you have problems the morning of surgery:  [phone]  If you experience any cold or flu symptoms such as cough, fever, chills, shortness of breath, etc. between now and your scheduled surgery, please notify us at the above number.   Remember:      Your last dose of semaglutide should have been on 07/03/2023.     Use your inhalers before you come and bring your rescue inhaler with you.     DO NOT take any medications for diabetes the morning of your procedure.    Follow the diet and prep instruction given to you by the office.   You may drink clear liquids until 0730 am on 07/11/2023.     Clear liquids allowed are:                    Water, Carbonated beverages (diabetics please choose diet or no sugar options), Black Coffee Only (No creamer, milk or cream, including half & half and powdered creamer), and Clear Sports drink (No red color; diabetics please choose diet or no sugar options)     Take these medicines the morning of surgery with A SIP OF WATER           diltiazem, duloxetine, pepcid, gabapentin, meclizine(if needed), rabeprazole, tramadol(if needed), venlafaxine, zomig.     Do not wear jewelry, make-up or nail polish, including gel polish,  artificial nails, or any other type of covering on natural nails (fingers and  toes).  Do not wear lotions, powders, or perfumes, or deodorant.  Do not shave 48 hours prior to surgery.  Men may shave face and neck.  Do not bring valuables to the hospital.  [facility] is not responsible for any belongings or valuables.  Contacts, dentures or bridgework may not be worn into surgery.  Leave your suitcase in the car.  After surgery it may be brought to your room.  For patients admitted to the hospital, discharge time will be determined by your treatment  team.  Patients discharged the day of surgery will not be allowed to drive home and must have someone with them for 24 hours.    Special instructions:   DO NOT smoke tobacco or vape for 24 hours before your procedure.  Please read over the following fact sheets that you were given. Anesthesia Post-op Instructions and Care and Recovery After Surgery      Colonoscopy, Adult, Care After The following information offers guidance on how to care for yourself after your procedure. Your health care provider may also give you more specific instructions. If you have problems or questions, contact your health care provider. What can I expect after the procedure? After the procedure, it is common to have: A small amount of blood in your stool for 24 hours after the procedure. Some gas. Mild cramping or bloating of your abdomen. Follow these instructions at home: Eating and drinking  Drink enough fluid to keep your urine pale yellow. Follow instructions from your health care provider about eating or drinking restrictions. Resume your normal diet as told by your health care provider. Avoid heavy or fried foods that are hard to digest. Activity Rest as told by your health care provider. Avoid sitting for a long time without moving.  Get up to take short walks every 1-2 hours. This is important to improve blood flow and breathing. Ask for help if you feel weak or unsteady. Return to your normal activities as told by your health care provider. Ask your health care provider what activities are safe for you. Managing cramping and bloating  Try walking around when you have cramps or feel bloated. If directed, apply heat to your abdomen as told by your health care provider. Use the heat source that your health care provider recommends, such as a moist heat pack or a heating pad. Place a towel between your skin and the heat source. Leave the heat on for 20-30 minutes. Remove the heat if your skin turns  bright red. This is especially important if you are unable to feel pain, heat, or cold. You have a greater risk of getting burned. General instructions If you were given a sedative during the procedure, it can affect you for several hours. Do not drive or operate machinery until your health care provider says that it is safe. For the first 24 hours after the procedure: Do not sign important documents. Do not drink alcohol. Do your regular daily activities at a slower pace than normal. Eat soft foods that are easy to digest. Take over-the-counter and prescription medicines only as told by your health care provider. Keep all follow-up visits. This is important. Contact a health care provider if: You have blood in your stool 2-3 days after the procedure. Get help right away if: You have more than a small spotting of blood in your stool. You have large blood clots in your stool. You have swelling of your abdomen. You have nausea or vomiting. You have a fever. You have increasing pain in your abdomen that is not relieved with medicine. These symptoms may be an emergency. Get help right away. Call 911. Do not wait to see if the symptoms will go away. Do not drive yourself to the hospital. Summary After the procedure, it is common to have a small amount of blood in your stool. You may also have mild cramping and bloating of your abdomen. If you were given a sedative during the procedure, it can affect you for several hours. Do not drive or operate machinery until your health care provider says that it is safe. Get help right away if you have a lot of blood in your stool, nausea or vomiting, a fever, or increased pain in your abdomen. This information is not intended to replace advice given to you by your health care provider. Make sure you discuss any questions you have with your health care provider. Document Revised: 10/15/2022 Document Reviewed: 04/25/2021 Elsevier Patient Education  2024  [employer] Monitored Anesthesia Care, Care After The following information offers guidance on how to care for yourself after your procedure. Your health care provider may also give you more specific instructions. If you have problems or questions, contact your health care provider. What can I expect after the procedure? After the procedure, it is common to have: Tiredness. Little or no memory about what happened during or after the procedure. Impaired judgment when it comes to making decisions. Nausea or vomiting. Some trouble with balance. Follow these instructions at home: For the time period you were told by your health care provider:  Rest. Do not participate in activities where you could fall or become injured. Do not drive or use machinery. Do not drink alcohol. Do not take sleeping pills or medicines that cause  drowsiness. Do not make important decisions or sign legal documents. Do not take care of children on your own. Medicines Take over-the-counter and prescription medicines only as told by your health care provider. If you were prescribed antibiotics, take them as told by your health care provider. Do not stop using the antibiotic even if you start to feel better. Eating and drinking Follow instructions from your health care provider about what you may eat and drink. Drink enough fluid to keep your urine pale yellow. If you vomit: Drink clear fluids slowly and in small amounts as you are able. Clear fluids include water, ice chips, low-calorie sports drinks, and fruit juice that has water added to it (diluted fruit juice). Eat light and bland foods in small amounts as you are able. These foods include bananas, applesauce, rice, lean meats, toast, and crackers. General instructions  Have a responsible adult stay with you for the time you are told. It is important to have someone help care for you until you are awake and alert. If you have sleep apnea, surgery and some  medicines can increase your risk for breathing problems. Follow instructions from your health care provider about wearing your sleep device: When you are sleeping. This includes during daytime naps. While taking prescription pain medicines, sleeping medicines, or medicines that make you drowsy. Do not use any products that contain nicotine or tobacco. These products include cigarettes, chewing tobacco, and vaping devices, such as e-cigarettes. If you need help quitting, ask your health care provider. Contact a health care provider if: You feel nauseous or vomit every time you eat or drink. You feel light-headed. You are still sleepy or having trouble with balance after 24 hours. You get a rash. You have a fever. You have redness or swelling around the IV site. Get help right away if: You have trouble breathing. You have new confusion after you get home. These symptoms may be an emergency. Get help right away. Call 911. Do not wait to see if the symptoms will go away. Do not drive yourself to the hospital. This information is not intended to replace advice given to you by your health care provider. Make sure you discuss any questions you have with your health care provider. Document Revised: 01/28/2022 Document Reviewed: 01/28/2022 Elsevier Patient Education  2024 [employer].

## 2023-07-09 ENCOUNTER — Encounter (HOSPITAL_COMMUNITY): Payer: Self-pay

## 2023-07-09 ENCOUNTER — Encounter (HOSPITAL_COMMUNITY)
Admission: RE | Admit: 2023-07-09 | Discharge: 2023-07-09 | Disposition: A | Discharge status: Home / Self Care | Payer: Medicare HMO | Source: Ambulatory Visit | Attending: Internal Medicine | Admitting: Internal Medicine

## 2023-07-09 VITALS — BP 127/76 | HR 98 | Temp 97.7°F | Resp 18 | Ht 62.0 in | Wt 186.3 lb

## 2023-07-09 DIAGNOSIS — R9431 Abnormal electrocardiogram [ECG] [EKG]: Secondary | ICD-10-CM | POA: Insufficient documentation

## 2023-07-09 DIAGNOSIS — Z01818 Encounter for other preprocedural examination: Secondary | ICD-10-CM | POA: Diagnosis not present

## 2023-07-09 DIAGNOSIS — I10 Essential (primary) hypertension: Secondary | ICD-10-CM | POA: Insufficient documentation

## 2023-07-09 DIAGNOSIS — Z01810 Encounter for preprocedural cardiovascular examination: Secondary | ICD-10-CM | POA: Insufficient documentation

## 2023-07-10 ENCOUNTER — Encounter (HOSPITAL_COMMUNITY): Payer: Medicare HMO

## 2023-07-11 ENCOUNTER — Ambulatory Visit (HOSPITAL_COMMUNITY)
Admission: RE | Admit: 2023-07-11 | Discharge: 2023-07-11 | Disposition: A | Discharge status: Home / Self Care | Payer: Medicare HMO | Attending: Internal Medicine | Admitting: Internal Medicine

## 2023-07-11 ENCOUNTER — Ambulatory Visit (HOSPITAL_COMMUNITY): Payer: Medicare HMO | Admitting: Anesthesiology

## 2023-07-11 ENCOUNTER — Encounter (HOSPITAL_COMMUNITY): Payer: Self-pay

## 2023-07-11 ENCOUNTER — Encounter (HOSPITAL_COMMUNITY)
Admission: RE | Disposition: A | Discharge status: Home / Self Care | Payer: Self-pay | Source: Home / Self Care | Attending: Internal Medicine

## 2023-07-11 DIAGNOSIS — K2289 Other specified disease of esophagus: Secondary | ICD-10-CM | POA: Diagnosis not present

## 2023-07-11 DIAGNOSIS — Z7984 Long term (current) use of oral hypoglycemic drugs: Secondary | ICD-10-CM | POA: Insufficient documentation

## 2023-07-11 DIAGNOSIS — K219 Gastro-esophageal reflux disease without esophagitis: Secondary | ICD-10-CM | POA: Diagnosis not present

## 2023-07-11 DIAGNOSIS — K2970 Gastritis, unspecified, without bleeding: Secondary | ICD-10-CM | POA: Insufficient documentation

## 2023-07-11 DIAGNOSIS — K2270 Barrett's esophagus without dysplasia: Secondary | ICD-10-CM | POA: Diagnosis not present

## 2023-07-11 DIAGNOSIS — K635 Polyp of colon: Secondary | ICD-10-CM | POA: Diagnosis not present

## 2023-07-11 DIAGNOSIS — Z09 Encounter for follow-up examination after completed treatment for conditions other than malignant neoplasm: Secondary | ICD-10-CM | POA: Diagnosis not present

## 2023-07-11 DIAGNOSIS — K449 Diaphragmatic hernia without obstruction or gangrene: Secondary | ICD-10-CM | POA: Diagnosis not present

## 2023-07-11 DIAGNOSIS — K5730 Diverticulosis of large intestine without perforation or abscess without bleeding: Secondary | ICD-10-CM | POA: Insufficient documentation

## 2023-07-11 DIAGNOSIS — I10 Essential (primary) hypertension: Secondary | ICD-10-CM | POA: Diagnosis not present

## 2023-07-11 DIAGNOSIS — J449 Chronic obstructive pulmonary disease, unspecified: Secondary | ICD-10-CM

## 2023-07-11 DIAGNOSIS — E1142 Type 2 diabetes mellitus with diabetic polyneuropathy: Secondary | ICD-10-CM | POA: Diagnosis not present

## 2023-07-11 DIAGNOSIS — Z860101 Personal history of adenomatous and serrated colon polyps: Secondary | ICD-10-CM | POA: Diagnosis not present

## 2023-07-11 DIAGNOSIS — D126 Benign neoplasm of colon, unspecified: Secondary | ICD-10-CM

## 2023-07-11 DIAGNOSIS — G4730 Sleep apnea, unspecified: Secondary | ICD-10-CM | POA: Diagnosis not present

## 2023-07-11 DIAGNOSIS — Z79899 Other long term (current) drug therapy: Secondary | ICD-10-CM | POA: Insufficient documentation

## 2023-07-11 DIAGNOSIS — K760 Fatty (change of) liver, not elsewhere classified: Secondary | ICD-10-CM

## 2023-07-11 DIAGNOSIS — K222 Esophageal obstruction: Secondary | ICD-10-CM | POA: Diagnosis not present

## 2023-07-11 DIAGNOSIS — K648 Other hemorrhoids: Secondary | ICD-10-CM | POA: Insufficient documentation

## 2023-07-11 DIAGNOSIS — J45909 Unspecified asthma, uncomplicated: Secondary | ICD-10-CM | POA: Diagnosis not present

## 2023-07-11 DIAGNOSIS — E1140 Type 2 diabetes mellitus with diabetic neuropathy, unspecified: Secondary | ICD-10-CM

## 2023-07-11 DIAGNOSIS — F418 Other specified anxiety disorders: Secondary | ICD-10-CM | POA: Diagnosis not present

## 2023-07-11 DIAGNOSIS — D123 Benign neoplasm of transverse colon: Secondary | ICD-10-CM | POA: Diagnosis not present

## 2023-07-11 DIAGNOSIS — K5520 Angiodysplasia of colon without hemorrhage: Secondary | ICD-10-CM | POA: Diagnosis not present

## 2023-07-11 DIAGNOSIS — Z1211 Encounter for screening for malignant neoplasm of colon: Secondary | ICD-10-CM | POA: Insufficient documentation

## 2023-07-11 DIAGNOSIS — R1310 Dysphagia, unspecified: Secondary | ICD-10-CM | POA: Diagnosis not present

## 2023-07-11 HISTORY — PX: ESOPHAGOGASTRODUODENOSCOPY (EGD) WITH PROPOFOL: SHX5813

## 2023-07-11 HISTORY — PX: COLONOSCOPY WITH PROPOFOL: SHX5780

## 2023-07-11 HISTORY — PX: BIOPSY: SHX5522

## 2023-07-11 HISTORY — PX: POLYPECTOMY: SHX5525

## 2023-07-11 LAB — GLUCOSE, CAPILLARY: Glucose-Capillary: 116 mg/dL — ABNORMAL HIGH (ref 70–99)

## 2023-07-11 SURGERY — COLONOSCOPY WITH PROPOFOL
Anesthesia: General

## 2023-07-11 MED ORDER — LACTATED RINGERS IV SOLN: INTRAVENOUS | Status: DC | PRN

## 2023-07-11 MED ORDER — PROPOFOL 500 MG/50ML IV EMUL
INTRAVENOUS | Status: DC | PRN
  Administered 2023-07-11: 150 ug/kg/min via INTRAVENOUS

## 2023-07-11 MED ORDER — PANTOPRAZOLE SODIUM 40 MG PO TBEC
40.0000 mg | DELAYED_RELEASE_TABLET | Freq: Two times a day (BID) | ORAL | 11 refills | Status: DC
Start: 2023-07-11 — End: 2023-09-01

## 2023-07-11 MED ORDER — PROPOFOL 10 MG/ML IV BOLUS
INTRAVENOUS | Status: DC | PRN
  Administered 2023-07-11: 100 mg via INTRAVENOUS

## 2023-07-11 MED ORDER — LIDOCAINE HCL (CARDIAC) PF 100 MG/5ML IV SOSY
PREFILLED_SYRINGE | INTRAVENOUS | Status: DC | PRN
  Administered 2023-07-11: 60 mg via INTRAVENOUS

## 2023-07-11 NOTE — Op Note (Signed)
[facility] Patient Name: Monique Warren Procedure Date: 07/11/2023 11:05 AM MRN: [id number] Date of Birth: [birth year] Attending MD: Hennie Duos. Marletta Lor , [place], [id number] CSN: [id number] Age: 72 Admit Type: Inpatient Procedure:                Upper GI endoscopy Indications:              Epigastric abdominal pain, Dysphagia, Heartburn Providers:                Hennie Duos. Marletta Lor, DO, Volanda Napoleon.,                            Technician, Lennice Sites Technician, Technician Referring MD:              Medicines:                See the Anesthesia note for documentation of the                            administered medications Complications:            No immediate complications. Estimated Blood Loss:     Estimated blood loss was minimal. Procedure:                Pre-Anesthesia Assessment:                           - The anesthesia plan was to use monitored                            anesthesia care (MAC).                           After obtaining informed consent, the endoscope was                            passed under direct vision. Throughout the                            procedure, the patient's blood pressure, pulse, and                            oxygen saturations were monitored continuously. The                            [id number]-H190 ([id number]) scope was introduced through the                            mouth, and advanced to the second part of duodenum.                            The upper GI endoscopy was accomplished without                            difficulty. The patient tolerated the procedure  well. Scope In: 11:17:12 AM Scope Out: 11:23:40 AM Total Procedure Duration: 0 hours 6 minutes 28 seconds  Findings:      A 2 cm hiatal hernia was present.      There were esophageal mucosal changes secondary to established       short-segment Barrett's disease present at the gastroesophageal       junction. The maximum longitudinal extent of  these mucosal changes was 2       cm in length. Mucosa was biopsied with a cold forceps for histology. One       specimen bottle was sent to pathology.      Mucosal changes including ringed esophagus were found in the middle       third of the esophagus. Biopsies were taken with a cold forceps for       histology. A TTS dilator was passed through the scope. Dilation with an       18-19-20 mm balloon dilator was performed to 20 mm. The dilation site       was examined and showed moderate improvement in luminal narrowing.      Patchy mild inflammation characterized by erythema was found in the       gastric body and in the gastric antrum. Biopsies were taken with a cold       forceps for Helicobacter pylori testing.      The duodenal bulb, first portion of the duodenum and second portion of       the duodenum were normal. Impression:               - 2 cm hiatal hernia.                           - Esophageal mucosal changes secondary to                            established short-segment Barrett's disease.                            Biopsied.                           - Esophageal mucosal changes suspicious for                            eosinophilic esophagitis. Biopsied. Dilated.                           - Gastritis. Biopsied.                           - Normal duodenal bulb, first portion of the                            duodenum and second portion of the duodenum. Moderate Sedation:      Per Anesthesia Care Recommendation:           - Patient has a contact number available for                            emergencies. The signs and symptoms of potential  delayed complications were discussed with the                            patient. Return to normal activities tomorrow.                            Written discharge instructions were provided to the                            patient.                           - Resume previous diet.                            - Continue present medications.                           - Await pathology results.                           - Repeat upper endoscopy PRN for retreatment.                           - Return to GI clinic in 6 weeks.                           - Use a proton pump inhibitor PO BID. Procedure Code(s):        --- Professional ---                           [phone], Esophagogastroduodenoscopy, flexible,                            transoral; with transendoscopic balloon dilation of                            esophagus (less than 30 mm diameter)                           43239, 59, Esophagogastroduodenoscopy, flexible,                            transoral; with biopsy, single or multiple Diagnosis Code(s):        --- Professional ---                           K44.9, Diaphragmatic hernia without obstruction or                            gangrene                           K22.70, Barrett's esophagus without dysplasia                           K22.89, Other specified disease of esophagus  K29.70, Gastritis, unspecified, without bleeding                           R10.13, Epigastric pain                           R13.10, Dysphagia, unspecified                           R12, Heartburn CPT copyright 2022 American Medical Association. All rights reserved. The codes documented in this report are preliminary and upon coder review may  be revised to meet current compliance requirements. Hennie Duos. Marletta Lor, DO Hennie Duos. Marletta Lor, DO 07/11/2023 11:29:20 AM This report has been signed electronically. Number of Addenda: 0

## 2023-07-11 NOTE — Op Note (Signed)
[facility] Patient Name: Monique Warren Procedure Date: 07/11/2023 11:01 AM MRN: [id number] Date of Birth: 1951-02-08 Attending MD: Hennie Duos. Marletta Lor , [place], [id number] CSN: [id number] Age: 72 Admit Type: Outpatient Procedure:                Colonoscopy Indications:              Surveillance: Personal history of adenomatous                            polyps on last colonoscopy 5 years ago Providers:                Hennie Duos. Marletta Lor, DO, Francoise Ceo RN, RN, Nena Polio, RN, Lennice Sites Technician, Technician Referring MD:              Medicines:                See the Anesthesia note for documentation of the                            administered medications Complications:            No immediate complications. Estimated Blood Loss:     Estimated blood loss was minimal. Procedure:                Pre-Anesthesia Assessment:                           - The anesthesia plan was to use monitored                            anesthesia care (MAC).                           After obtaining informed consent, the colonoscope                            was passed under direct vision. Throughout the                            procedure, the patient's blood pressure, pulse, and                            oxygen saturations were monitored continuously. The                            [id number]-HQ190L ([id number]) scope was introduced through                            the anus and advanced to the the cecum, identified                            by appendiceal orifice and ileocecal valve. The  colonoscopy was performed without difficulty. The                            patient tolerated the procedure well. The quality                            of the bowel preparation was evaluated using the                            BBPS [facility] Bowel Preparation Scale) with scores                            of: Right Colon = 3, Transverse Colon = 3 and Left                             Colon = 3 (entire mucosa seen well with no residual                            staining, small fragments of stool or opaque                            liquid). The total BBPS score equals 9. Scope In: 11:37:17 AM Scope Out: 11:56:48 AM Scope Withdrawal Time: 0 hours 15 minutes 25 seconds  Total Procedure Duration: 0 hours 19 minutes 31 seconds  Findings:      Non-bleeding internal hemorrhoids were found during endoscopy.      Multiple medium-mouthed and small-mouthed diverticula were found in the       sigmoid colon.      A 5 mm polyp was found in the transverse colon. The polyp was sessile.       The polyp was removed with a cold snare. Resection and retrieval were       complete.      An 8 mm polyp was found in the sigmoid colon. The polyp was sessile. The       polyp was removed with a cold snare. Resection and retrieval were       complete.      A single angiodysplastic lesion without bleeding was found in the cecum. Impression:               - Non-bleeding internal hemorrhoids.                           - Diverticulosis in the sigmoid colon.                           - One 5 mm polyp in the transverse colon, removed                            with a cold snare. Resected and retrieved.                           - One 8 mm polyp in the sigmoid colon, removed with  a cold snare. Resected and retrieved.                           - A single non-bleeding colonic angiodysplastic                            lesion. Moderate Sedation:      Per Anesthesia Care Recommendation:           - Patient has a contact number available for                            emergencies. The signs and symptoms of potential                            delayed complications were discussed with the                            patient. Return to normal activities tomorrow.                            Written discharge instructions were provided to the                             patient.                           - Resume previous diet.                           - Continue present medications.                           - Await pathology results.                           - Repeat colonoscopy in 5 years for surveillance.                           - Return to GI clinic in 6 weeks. Procedure Code(s):        --- Professional ---                           [phone], Colonoscopy, flexible; with removal of                            tumor(s), polyp(s), or other lesion(s) by snare                            technique Diagnosis Code(s):        --- Professional ---                           Z86.010, Personal history of colonic polyps                           K64.8, Other hemorrhoids  D12.3, Benign neoplasm of transverse colon (hepatic                            flexure or splenic flexure)                           D12.5, Benign neoplasm of sigmoid colon                           K55.20, Angiodysplasia of colon without hemorrhage                           K57.30, Diverticulosis of large intestine without                            perforation or abscess without bleeding CPT copyright 2022 American Medical Association. All rights reserved. The codes documented in this report are preliminary and upon coder review may  be revised to meet current compliance requirements. Hennie Duos. Marletta Lor, DO Hennie Duos. Marletta Lor, DO 07/11/2023 12:01:36 PM This report has been signed electronically. Number of Addenda: 0

## 2023-07-11 NOTE — Discharge Instructions (Signed)
EGD Discharge instructions Please read the instructions outlined below and refer to this sheet in the next few weeks. These discharge instructions provide you with general information on caring for yourself after you leave the hospital. Your doctor may also give you specific instructions. While your treatment has been planned according to the most current medical practices available, unavoidable complications occasionally occur. If you have any problems or questions after discharge, please call your doctor. ACTIVITY You may resume your regular activity but move at a slower pace for the next 24 hours.  Take frequent rest periods for the next 24 hours.  Walking will help expel (get rid of) the air and reduce the bloated feeling in your abdomen.  No driving for 24 hours (because of the anesthesia (medicine) used during the test).  You may shower.  Do not sign any important legal documents or operate any machinery for 24 hours (because of the anesthesia used during the test).  NUTRITION Drink plenty of fluids.  You may resume your normal diet.  Begin with a light meal and progress to your normal diet.  Avoid alcoholic beverages for 24 hours or as instructed by your caregiver.  MEDICATIONS You may resume your normal medications unless your caregiver tells you otherwise.  WHAT YOU CAN EXPECT TODAY You may experience abdominal discomfort such as a feeling of fullness or "gas" pains.  FOLLOW-UP Your doctor will discuss the results of your test with you.  SEEK IMMEDIATE MEDICAL ATTENTION IF ANY OF THE FOLLOWING OCCUR: Excessive nausea (feeling sick to your stomach) and/or vomiting.  Severe abdominal pain and distention (swelling).  Trouble swallowing.  Temperature over 101 F (37.8 C).  Rectal bleeding or vomiting of blood.     Colonoscopy Discharge Instructions  Read the instructions outlined below and refer to this sheet in the next few weeks. These discharge instructions provide you with  general information on caring for yourself after you leave the hospital. Your doctor may also give you specific instructions. While your treatment has been planned according to the most current medical practices available, unavoidable complications occasionally occur.   ACTIVITY You may resume your regular activity, but move at a slower pace for the next 24 hours.  Take frequent rest periods for the next 24 hours.  Walking will help get rid of the air and reduce the bloated feeling in your belly (abdomen).  No driving for 24 hours (because of the medicine (anesthesia) used during the test).   Do not sign any important legal documents or operate any machinery for 24 hours (because of the anesthesia used during the test).  NUTRITION Drink plenty of fluids.  You may resume your normal diet as instructed by your doctor.  Begin with a light meal and progress to your normal diet. Heavy or fried foods are harder to digest and may make you feel sick to your stomach (nauseated).  Avoid alcoholic beverages for 24 hours or as instructed.  MEDICATIONS You may resume your normal medications unless your doctor tells you otherwise.  WHAT YOU CAN EXPECT TODAY Some feelings of bloating in the abdomen.  Passage of more gas than usual.  Spotting of blood in your stool or on the toilet paper.  IF YOU HAD POLYPS REMOVED DURING THE COLONOSCOPY: No aspirin products for 7 days or as instructed.  No alcohol for 7 days or as instructed.  Eat a soft diet for the next 24 hours.  FINDING OUT THE RESULTS OF YOUR TEST Not all test results are  available during your visit. If your test results are not back during the visit, make an appointment with your caregiver to find out the results. Do not assume everything is normal if you have not heard from your caregiver or the medical facility. It is important for you to follow up on all of your test results.  SEEK IMMEDIATE MEDICAL ATTENTION IF: You have more than a spotting of  blood in your stool.  Your belly is swollen (abdominal distention).  You are nauseated or vomiting.  You have a temperature over 101.  You have abdominal pain or discomfort that is severe or gets worse throughout the day.   Your upper endoscopy revealed a small hiatal hernia.  Previously noted Barrett's esophagus again seen today.  I rebiopsied this today.  I did stretch your esophagus, hopefully this helps with feeling of food getting stuck.  Also took samples of your mid esophagus as well.    You also had mild amount inflammation in your stomach.  I took biopsies of this to rule out infection with a bacteria called H. pylori.    Small bowel appeared normal.  I am going to change your Aciphex to pantoprazole 40 mg twice daily and have sent this to your medication.  You can take Pepcid on top of this as needed.  Your colonoscopy revealed 2 polyp(s) which I removed successfully. Await pathology results, my office will contact you. I recommend repeating colonoscopy in 5 years for surveillance purposes.   You also have diverticulosis and internal hemorrhoids. I would recommend increasing fiber in your diet or adding OTC Benefiber/Metamucil. Be sure to drink at least 4 to 6 glasses of water daily. Follow-up with GI in 6-8 weeks    I hope you have a great rest of your week!  Hennie Duos. Marletta Lor, D.O. Gastroenterology and Hepatology [facility]

## 2023-07-11 NOTE — Telephone Encounter (Signed)
Spoke to [place] about adding egd along with colonoscopy this morning. She says it could be added, just ask Dr.Carver. Sent secure message to Dr.Carver about adding EGD to colonoscopy and he said "sure". Spoke to [place] and advised her that EGD could be added. She said she would add the procedure. Consent has been updated.

## 2023-07-11 NOTE — Anesthesia Preprocedure Evaluation (Signed)
Anesthesia Evaluation  Patient identified by MRN, date of birth, ID band Patient awake    Reviewed: Allergy & Precautions, NPO status , Patient's Chart, lab work & pertinent test results  Airway Mallampati: II  TM Distance: >3 FB Neck ROM: Full    Dental  (+) Dental Advisory Given, Missing, Chipped,    Pulmonary asthma , sleep apnea and Continuous Positive Airway Pressure Ventilation , neg COPD,  COPD inhaler, former smoker   Pulmonary exam normal breath sounds clear to auscultation       Cardiovascular Exercise Tolerance: Good hypertension, Pt. on medications + dysrhythmias + Valvular Problems/Murmurs  Rhythm:Regular Rate:Normal + Systolic murmurs Diastolic dysfunction. Heart murmur   Neuro/Psych  Headaches PSYCHIATRIC DISORDERS Anxiety Depression    CRANIOTOMY FOR ANEURYSM / VERTEBROBASILAR / CAROTID CIRCULATION - 10/24/15 diabetic neuropathy. Vertigo  Neuromuscular disease    GI/Hepatic Neg liver ROS, hiatal hernia,GERD  Medicated and Controlled,,  Endo/Other  diabetes, Well Controlled, Type 2, Oral Hypoglycemic Agents    Renal/GU negative Renal ROS  negative genitourinary   Musculoskeletal  (+) Arthritis , Osteoarthritis,    Abdominal   Peds negative pediatric ROS (+)  Hematology negative hematology ROS (+)   Anesthesia Other Findings Loss of balance  Reproductive/Obstetrics negative OB ROS                             Anesthesia Physical Anesthesia Plan  ASA: 3  Anesthesia Plan: General   Post-op Pain Management: Minimal or no pain anticipated   Induction: Intravenous  PONV Risk Score and Plan: Propofol infusion  Airway Management Planned: Nasal Cannula and Natural Airway  Additional Equipment: None  Intra-op Plan:   Post-operative Plan:   Informed Consent: I have reviewed the patients History and Physical, chart, labs and discussed the procedure including the risks,  benefits and alternatives for the proposed anesthesia with the patient or authorized representative who has indicated his/her understanding and acceptance.     Dental advisory given  Plan Discussed with: CRNA  Anesthesia Plan Comments:         Anesthesia Quick Evaluation

## 2023-07-11 NOTE — Transfer of Care (Signed)
Immediate Anesthesia Transfer of Care Note  Patient: Monique Warren  Procedure(s) Performed: COLONOSCOPY WITH PROPOFOL ESOPHAGOGASTRODUODENOSCOPY (EGD) WITH PROPOFOL Balloon dilation wire-guided BIOPSY POLYPECTOMY  Patient Location: Short Stay  Anesthesia Type:General  Level of Consciousness: awake, alert , and oriented  Airway & Oxygen Therapy: Patient Spontanous Breathing  Post-op Assessment: Report given to RN and Post -op Vital signs reviewed and stable  Post vital signs: Reviewed and stable  Last Vitals:  Vitals Value Taken Time  BP 126/54 07/11/23 1202  Temp 36.4 C 07/11/23 1202  Pulse 90 07/11/23 1202  Resp 14 07/11/23 1202  SpO2 98 % 07/11/23 1202    Last Pain:  Vitals:   07/11/23 1202  TempSrc: Axillary  PainSc: 0-No pain         Complications: No notable events documented.

## 2023-07-11 NOTE — Progress Notes (Signed)
At request of the patient, called her husband to inform him the patients procedure was scheduled for 11:45 today.  He voiced understanding.

## 2023-07-11 NOTE — Anesthesia Postprocedure Evaluation (Signed)
Anesthesia Post Note  Patient: Monique Warren  Procedure(s) Performed: COLONOSCOPY WITH PROPOFOL ESOPHAGOGASTRODUODENOSCOPY (EGD) WITH PROPOFOL Balloon dilation wire-guided BIOPSY POLYPECTOMY  Patient location during evaluation: PACU Anesthesia Type: General Level of consciousness: awake and alert Pain management: pain level controlled Vital Signs Assessment: post-procedure vital signs reviewed and stable Respiratory status: spontaneous breathing, nonlabored ventilation, respiratory function stable and patient connected to nasal cannula oxygen Cardiovascular status: blood pressure returned to baseline and stable Postop Assessment: no apparent nausea or vomiting Anesthetic complications: no   There were no known notable events for this encounter.   Last Vitals:  Vitals:   07/11/23 1012 07/11/23 1202  BP: (!) 152/73 (!) 126/54  Pulse: 93 90  Resp: 20 14  Temp: (!) 36.3 C (!) 36.4 C  SpO2: 100% 98%    Last Pain:  Vitals:   07/11/23 1202  TempSrc: Axillary  PainSc: 0-No pain                 Zeanna Sunde L Alexcia Schools

## 2023-07-11 NOTE — H&P (Signed)
Primary Care Physician:  Anabel Halon, MD Primary Gastroenterologist:  Dr. Marletta Lor  Pre-Procedure History & Physical: HPI:  Monique Warren is a 72 y.o. female is here here for an EGD with possible dilation due to history of dysphagia, GERD, chest pain and colonoscopy for personal history of adenomatous colon polyps in 2019.   Past Medical History:  Diagnosis Date   Anxiety    Arthritis    Asthma    Celiac disease    Depression    Diabetes mellitus, type II (HCC)    Diabetic neuropathy (HCC)    Diastolic dysfunction    Grade 1 with preserved EF   Dysrhythmia    GERD (gastroesophageal reflux disease)    Heart murmur    Hiatal hernia    Sliding   Hyperlipidemia    Hypertension    Migraine    Obstructive sleep apnea    Osteoporosis    Vertigo     Past Surgical History:  Procedure Laterality Date   ABDOMINAL HYSTERECTOMY  09/29/2003   APPENDECTOMY     BALLOON DILATION N/A 02/08/2022   Procedure: BALLOON DILATION;  Surgeon: Lanelle Bal, DO;  Location: [facility];  Service: Endoscopy;  Laterality: N/A;   BIOPSY  02/08/2022   Procedure: BIOPSY;  Surgeon: Lanelle Bal, DO;  Location: [facility];  Service: Endoscopy;;   BREAST BIOPSY Left    CARPAL TUNNEL RELEASE     CATARACT EXTRACTION Bilateral    October and November 2016   COLONOSCOPY  09/2017   Blake Divine, MD in [place]; 5 mm tubular adenoma in the descending colon s/p resected, mild sigmoid diverticulosis, internal hemorrhoids.  Recommended repeat colonoscopy in 5 years.   CRANIOTOMY FOR ANEURYSM / VERTEBROBASILAR / CAROTID CIRCULATION Right 10/24/2015   ESOPHAGOGASTRODUODENOSCOPY  09/2017   [name]; irregular Z-line s/p biopsy, decreased LES tone, 4 cm hiatal hernia, erythema in gastric antrum biopsied, normal examined duodenum biopsied.  Esophageal biopsy suggestive of reflux, no Barrett's, gastric biopsy with nonspecific chronic gastritis with intestinal metaplasia, duodenal biopsy with increased  intraepithelial lymphocytes without blunting of villi, nonspecific.   ESOPHAGOGASTRODUODENOSCOPY (EGD) WITH PROPOFOL N/A 02/08/2022   Surgeon: Lanelle Bal, DO; 2 cm hiatal hernia, short segment Barrett's esophagus without dysplasia, mild Schatzki's ring dilated, gastritis with biopsies benign, normal examined duodenum.  Repeat EGD in 5 years.   KNEE ARTHROSCOPY WITH MEDIAL MENISECTOMY Left 04/09/2022   Procedure: KNEE ARTHROSCOPY WITH PARTIAL MEDIAL MENISCECTOMY, LATERAL MENISCAL DEBRIDEMENT;  Surgeon: Vickki Hearing, MD;  Location: [facility];  Service: Orthopedics;  Laterality: Left;   KNEE ARTHROSCOPY WITH MENISCAL REPAIR Left 04/09/2022   Procedure: KNEE ARTHROSCOPY WITH MEDIAL MENISCAL REPAIR;  Surgeon: Vickki Hearing, MD;  Location: [facility];  Service: Orthopedics;  Laterality: Left;   LAPAROSCOPY     TOTAL KNEE ARTHROPLASTY Right 12/06/2022   Procedure: TOTAL KNEE ARTHROPLASTY;  Surgeon: Vickki Hearing, MD;  Location: [facility];  Service: Orthopedics;  Laterality: Right;   UTERINE FIBROID SURGERY     WRIST SURGERY Bilateral     Prior to Admission medications   Medication Sig Start Date End Date Taking? Authorizing Provider  atorvastatin (LIPITOR) 40 MG tablet TAKE 1 TABLET AT BEDTIME 04/03/23  Yes Anabel Halon, MD  Blood Glucose Monitoring Suppl (ONE TOUCH ULTRA 2) w/Device KIT USE TO CHECK BLOOD GLUCOSE 4 TIMES A DAY 04/30/23  Yes Reardon, Freddi Starr, NP  Cholecalciferol (VITAMIN D3) 125 MCG (5000 UT) capsule Take 5,000 Units by mouth daily.  Yes [provider]  diltiazem (CARDIZEM SR) 120 MG 12 hr capsule TAKE 2 CAPSULES DAILY 06/02/23  Yes Pricilla Riffle, MD  DULoxetine (CYMBALTA) 60 MG capsule Take 1 capsule (60 mg total) by mouth daily. 02/20/23  Yes Anabel Halon, MD  Estradiol 10 MCG TABS vaginal tablet Place 10 mcg vaginally every Monday.   Yes [provider]  famotidine (PEPCID) 40 MG tablet Take 1 tablet (40 mg total) by mouth daily. 02/20/23  Yes  Anabel Halon, MD  Fluticasone-Salmeterol,sensor, [facility]) [phone] MCG/ACT AEPB Inhale 1 Inhalation into the lungs 2 (two) times daily. USE 1 INHALATION TWICE A DAY Strength: 232-14 MCG/ACT 02/20/23  Yes Anabel Halon, MD  gabapentin (NEURONTIN) 300 MG capsule Take 1 capsule (300 mg total) by mouth at bedtime. 02/20/23  Yes Anabel Halon, MD  Lancets [facility] DELICA PLUS [place]) MISC Use to check glucose once daily 01/07/22  Yes Dani Gobble, NP  linaclotide [facility]) 72 MCG capsule Take 1 capsule (72 mcg total) by mouth daily before breakfast. 06/11/23  Yes Clearance Coots, Kristen S, PA-C  meclizine (ANTIVERT) 25 MG tablet Take 1 tablet (25 mg total) by mouth 3 (three) times daily as needed for dizziness. 06/16/23  Yes Anabel Halon, MD  Multiple Vitamin (MULTIVITAMIN WITH MINERALS) TABS tablet Take 1 tablet by mouth daily.   Yes [provider]  [facility] ULTRA test strip Use to monitor glucose once daily 01/07/22  Yes Reardon, Freddi Starr, NP  promethazine (PHENERGAN) 12.5 MG tablet Take 1 tablet (12.5 mg total) by mouth every 6 (six) hours as needed for nausea or vomiting. 12/20/22  Yes Vickki Hearing, MD  RABEprazole (ACIPHEX) 20 MG tablet Take 1 tablet (20 mg total) by mouth 2 (two) times daily before a meal. 06/11/23  Yes Ermalinda Memos S, PA-C  traMADol (ULTRAM) 50 MG tablet Take 1 tablet (50 mg total) by mouth every 12 (twelve) hours as needed. 06/16/23  Yes Anabel Halon, MD  triamcinolone cream (KENALOG) 0.1 % Apply 1 Application topically 2 (two) times daily. 06/16/23  Yes Anabel Halon, MD  venlafaxine (EFFEXOR) 37.5 MG tablet TAKE 1 TABLET TWICE A DAY 05/28/23  Yes Anabel Halon, MD  acetaminophen (TYLENOL) 500 MG tablet Take 1 tablet (500 mg total) by mouth every 6 (six) hours as needed for moderate pain. 12/10/22   Vickki Hearing, MD  AIMOVIG 140 MG/ML SOAJ INJECT 140 MG UNDER THE SKIN EVERY 30 DAYS 02/20/23   Anabel Halon, MD  albuterol (VENTOLIN HFA) 108  (90 Base) MCG/ACT inhaler USE 2 INHALATIONS EVERY 6 HOURS AS NEEDED FOR WHEEZING OR SHORTNESS OF BREATH 02/20/23   Anabel Halon, MD  clindamycin (CLEOCIN) 150 MG capsule Take 1 - 30 min prior to dental work 04/28/23   Vickki Hearing, MD  docusate sodium (COLACE) 100 MG capsule Take 1 capsule (100 mg total) by mouth 2 (two) times daily. Patient not taking: Reported on 06/11/2023 12/10/22   Vickki Hearing, MD  PROLIA 60 MG/ML SOSY injection INJECT 60 MG UNDER THE SKIN EVERY 6 MONTHS 02/20/23   Anabel Halon, MD  Semaglutide, 2 MG/DOSE, 8 MG/3ML SOPN Inject 2 mg as directed once a week. 03/10/23   Dani Gobble, NP  zolmitriptan (ZOMIG) 5 MG tablet Take 5 mg by mouth daily as needed for migraine.    [provider]    Allergies as of 06/11/2023 - Review Complete 06/11/2023  Allergen Reaction Noted  Cefuroxime Anaphylaxis, Hives, Shortness Of Breath, Swelling, and Anxiety 09/16/2006   Codeine Hives, Shortness Of Breath, Nausea And Vomiting, Swelling, and Anxiety 09/16/2006   Sulfa antibiotics Anaphylaxis, Hives, Nausea And Vomiting, and Swelling 09/02/2017   Sulfamethoxazole-trimethoprim Shortness Of Breath, Swelling, and Anxiety 09/16/2006   Gluten meal  02/05/2022    Family History  Problem Relation Age of Onset   Breast cancer Mother    Thyroid disease Mother    Stroke Mother    Hypertension Father    Hyperlipidemia Father    Heart attack Father    Heart failure Father    Breast cancer Paternal Grandmother    Cancer - Colon Neg Hx    Gastric cancer Neg Hx    Esophageal cancer Neg Hx    Liver cancer Neg Hx    Autoimmune disease Neg Hx     Social History   Socioeconomic History   Marital status: Married    Spouse name: Not on file   Number of children: Not on file   Years of education: Not on file   Highest education level: GED or equivalent  Occupational History   Not on file  Tobacco Use   Smoking status: Former    Current packs/day: 0.50    Types:  Cigarettes   Smokeless tobacco: Never  Vaping Use   Vaping status: Never Used  Substance and Sexual Activity   Alcohol use: Yes    Alcohol/week: 4.0 standard drinks of alcohol    Types: 2 Glasses of wine, 2 Shots of liquor per week    Comment: rarely   Drug use: Never   Sexual activity: Not on file  Other Topics Concern   Not on file  Social History Narrative   Not on file   Social Determinants of Health   Financial Resource Strain: Low Risk  (01/01/2023)   Overall Financial Resource Strain (CARDIA)    Difficulty of Paying Living Expenses: Not hard at all  Food Insecurity: No Food Insecurity (01/01/2023)   Hunger Vital Sign    Worried About Running Out of Food in the Last Year: Never true    Ran Out of Food in the Last Year: Never true  Transportation Needs: No Transportation Needs (01/01/2023)   PRAPARE - Administrator, Civil Service (Medical): No    Lack of Transportation (Non-Medical): No  Physical Activity: Unknown (01/01/2023)   Exercise Vital Sign    Days of Exercise per Week: 0 days    Minutes of Exercise per Session: Not on file  Stress: Stress Concern Present (01/01/2023)   [employer] of Occupational Health - Occupational Stress Questionnaire    Feeling of Stress : To some extent  Social Connections: Moderately Integrated (01/01/2023)   Social Connection and Isolation Panel [NHANES]    Frequency of Communication with Friends and Family: More than three times a week    Frequency of Social Gatherings with Friends and Family: More than three times a week    Attends Religious Services: 1 to 4 times per year    Active Member of [employer] or Organizations: No    Attends Banker Meetings: Not on file    Marital Status: Married  Catering manager Violence: Not At Risk (12/06/2022)   Humiliation, Afraid, Rape, and Kick questionnaire    Fear of Current or Ex-Partner: No    Emotionally Abused: No    Physically Abused: No    Sexually Abused: No     Review of Systems: General: Negative for  fever, chills, fatigue, weakness. Eyes: Negative for vision changes.  ENT: Negative for hoarseness, difficulty swallowing , nasal congestion. CV: Negative for chest pain, angina, palpitations, dyspnea on exertion, peripheral edema.  Respiratory: Negative for dyspnea at rest, dyspnea on exertion, cough, sputum, wheezing.  GI: See history of present illness. GU:  Negative for dysuria, hematuria, urinary incontinence, urinary frequency, nocturnal urination.  MS: Negative for joint pain, low back pain.  Derm: Negative for rash or itching.  Neuro: Negative for weakness, abnormal sensation, seizure, frequent headaches, memory loss, confusion.  Psych: Negative for anxiety, depression Endo: Negative for unusual weight change.  Heme: Negative for bruising or bleeding. Allergy: Negative for rash or hives.  Physical Exam: Vital signs in last 24 hours: Temp:  [97.4 F (36.3 C)] 97.4 F (36.3 C) (10/25 1012) Pulse Rate:  [93] 93 (10/25 1012) Resp:  [20] 20 (10/25 1012) BP: (152)/(73) 152/73 (10/25 1012) SpO2:  [100 %] 100 % (10/25 1012)   General:   Alert,  Well-developed, well-nourished, pleasant and cooperative in NAD Head:  Normocephalic and atraumatic. Eyes:  Sclera clear, no icterus.   Conjunctiva pink. Ears:  Normal auditory acuity. Nose:  No deformity, discharge,  or lesions. Msk:  Symmetrical without gross deformities. Normal posture. Extremities:  Without clubbing or edema. Neurologic:  Alert and  oriented x4;  grossly normal neurologically. Skin:  Intact without significant lesions or rashes. Psych:  Alert and cooperative. Normal mood and affect.   Impression/Plan: Monique Warren is here for an EGD with possible dilation due to history of dysphagia, GERD, chest pain and colonoscopy for personal history of adenomatous colon polyps in 2019.   Risks, benefits, limitations, imponderables and alternatives regarding procedure have been  reviewed with the patient. Questions have been answered. All parties agreeable.

## 2023-07-14 LAB — SURGICAL PATHOLOGY

## 2023-07-17 ENCOUNTER — Encounter (HOSPITAL_COMMUNITY): Payer: Medicare HMO

## 2023-07-17 ENCOUNTER — Encounter (HOSPITAL_COMMUNITY): Payer: Self-pay | Admitting: Internal Medicine

## 2023-07-19 IMAGING — MG MM DIGITAL SCREENING BILAT W/ TOMO AND CAD
6 of 10 series · 6 of 30 positions shown · non-contrast
Comparison: Previous exam(s).

CLINICAL DATA: Screening.

EXAM:
DIGITAL SCREENING BILATERAL MAMMOGRAM WITH TOMOSYNTHESIS AND CAD
TECHNIQUE: Bilateral screening digital craniocaudal and mediolateral oblique
mammograms were obtained. Bilateral screening digital breast
tomosynthesis was performed. The images were evaluated with
computer-aided detection.

[R MLO synth-2D]
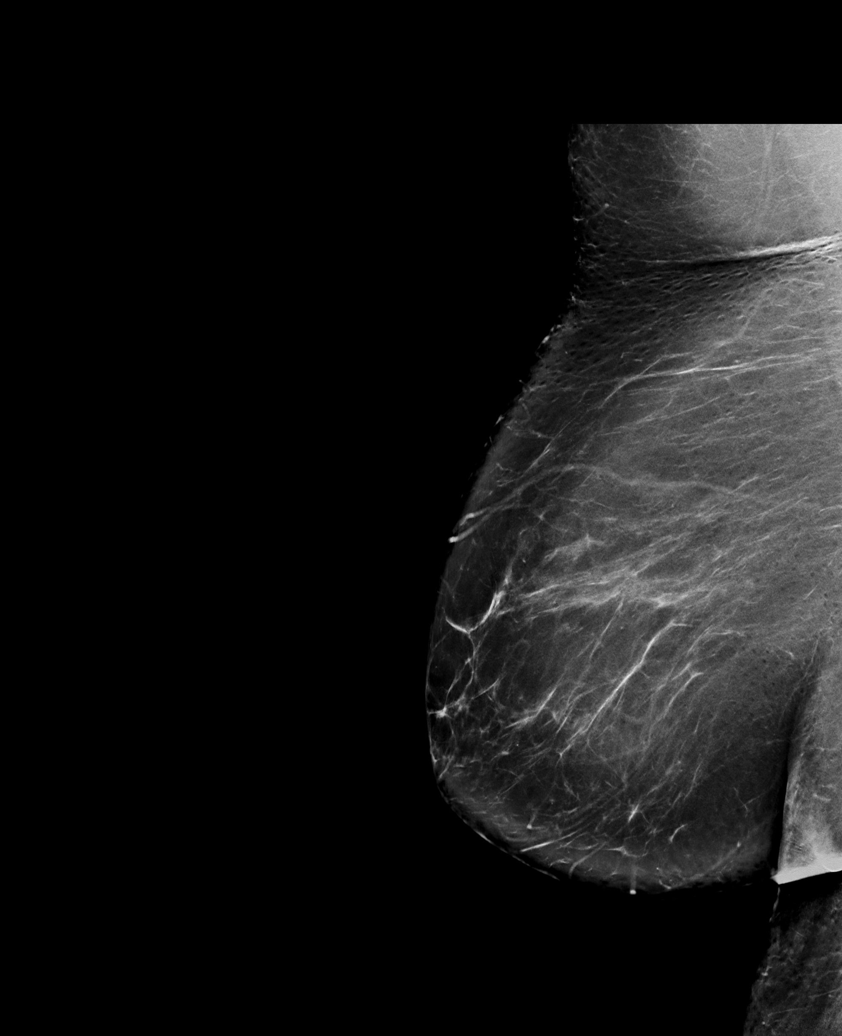

[L CC synth-2D]
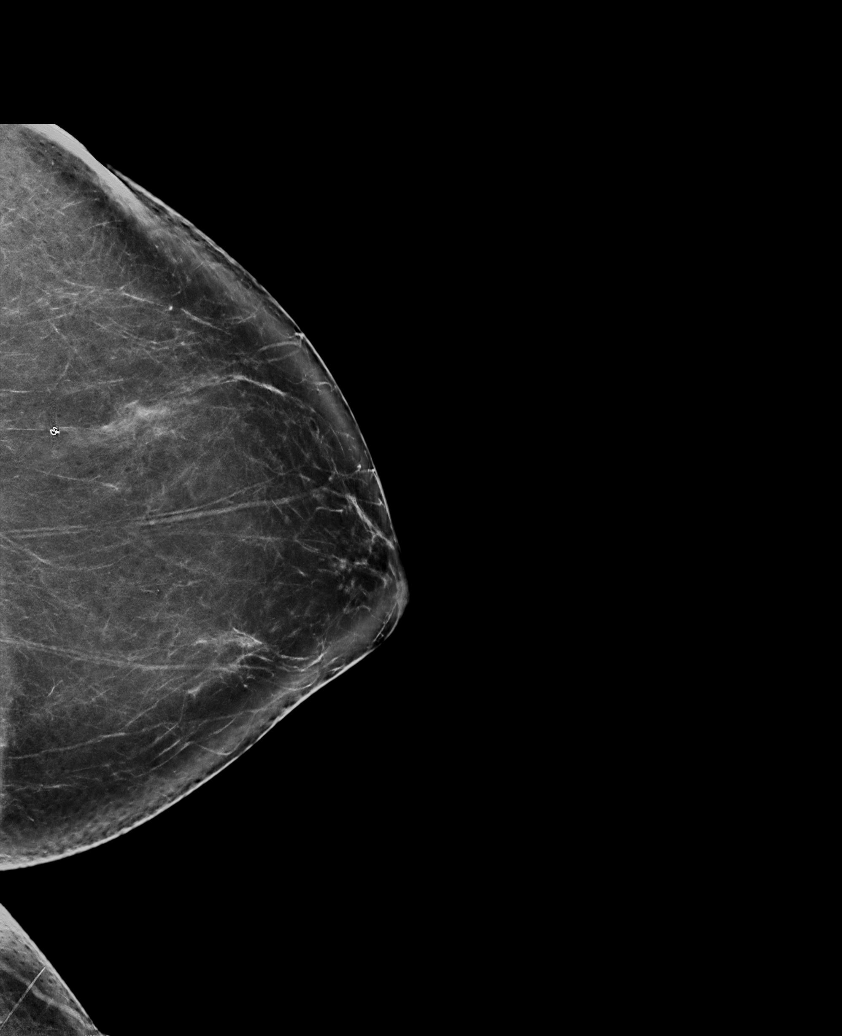

[L MLO synth-2D (1 of 2)]
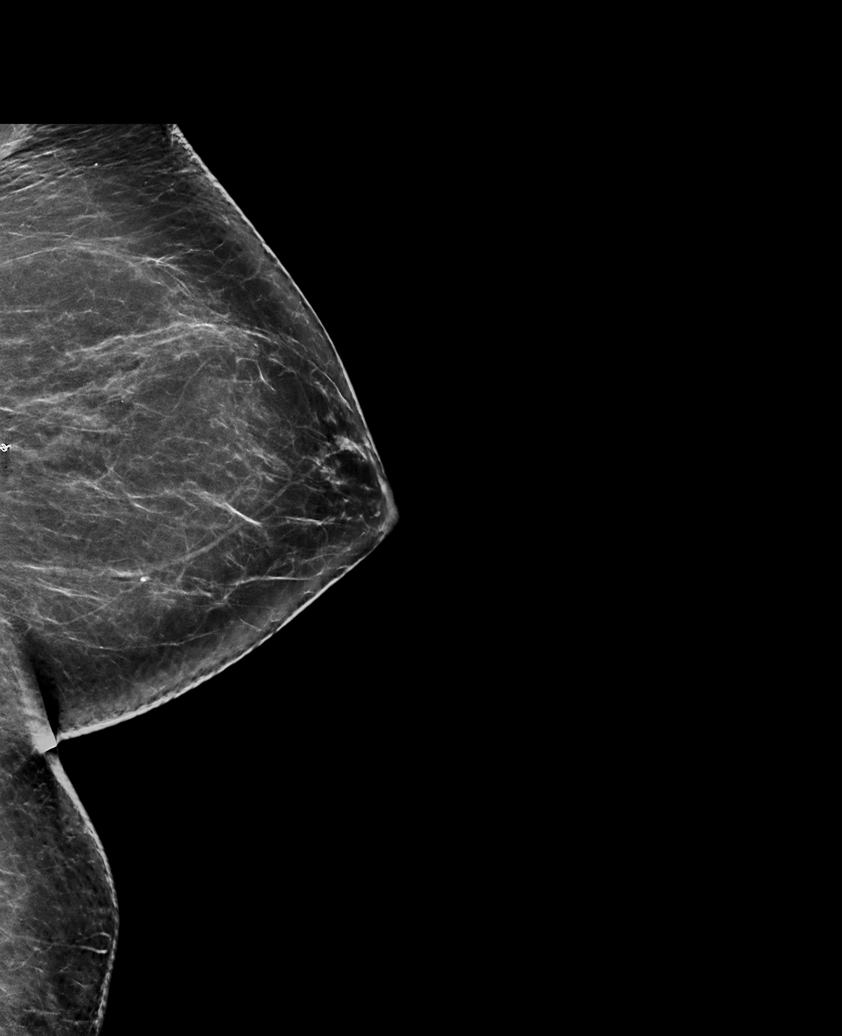

[L MLO synth-2D (2 of 2)]
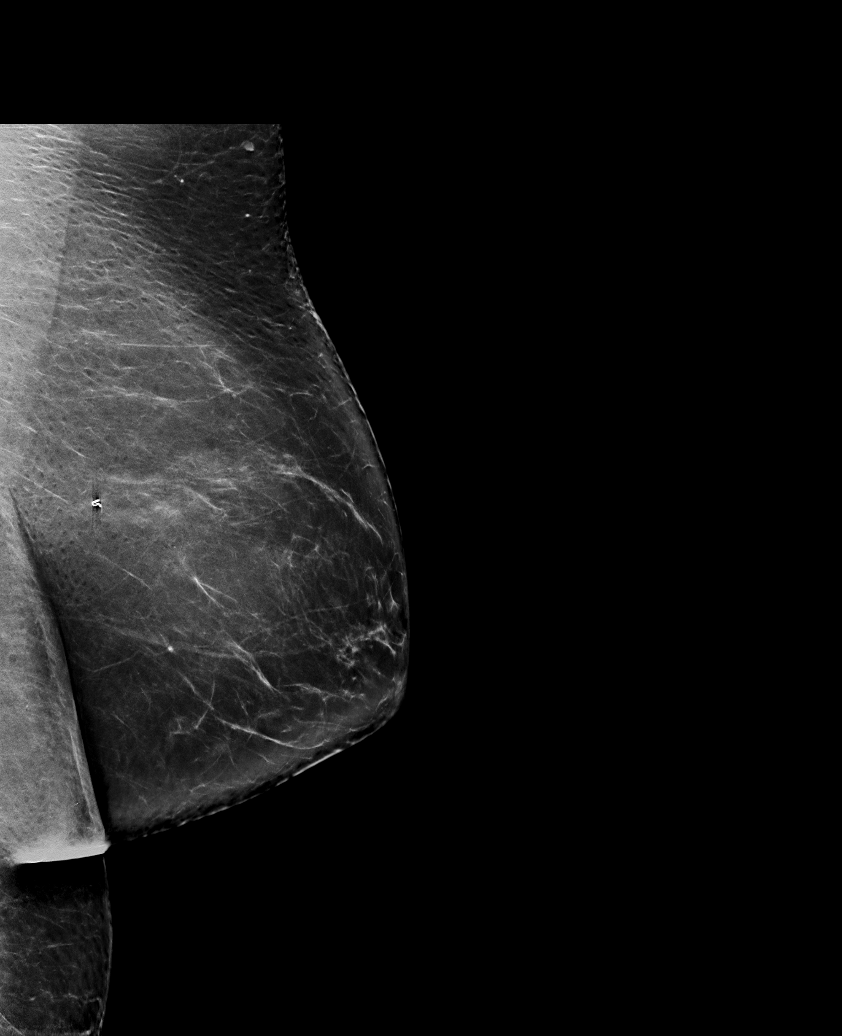

[R CC synth-2D]
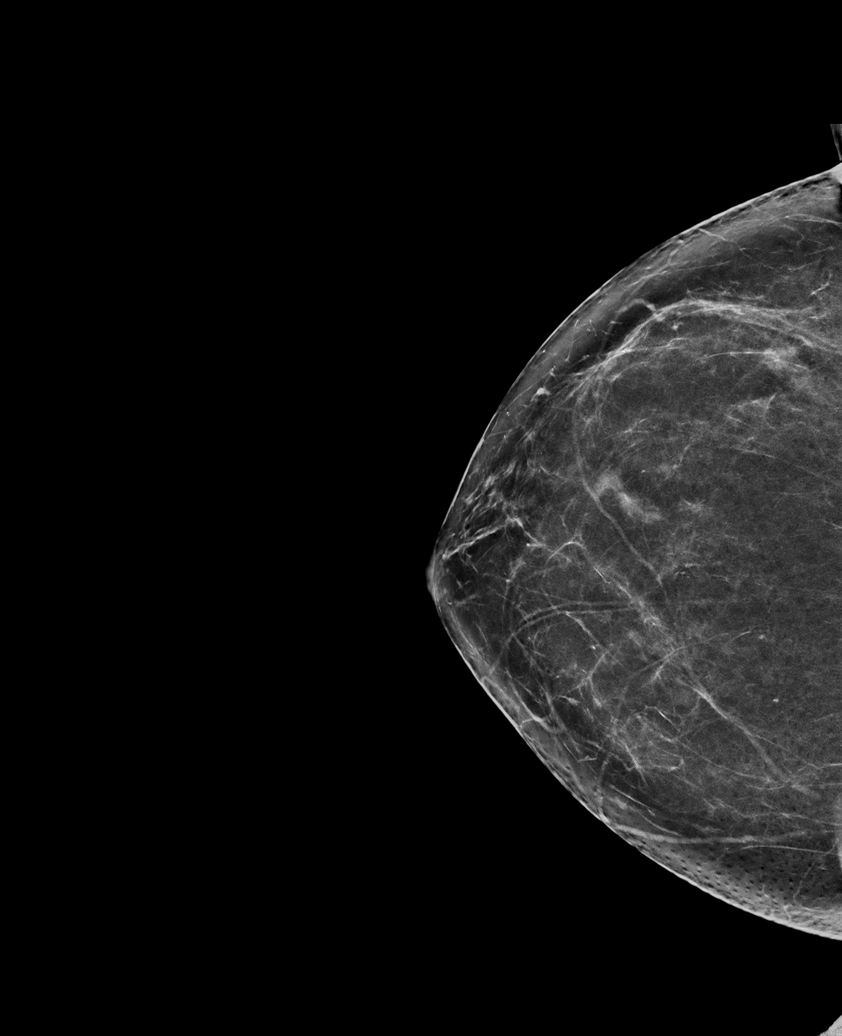

[L CC tomo · tomo slice 42/83.0]
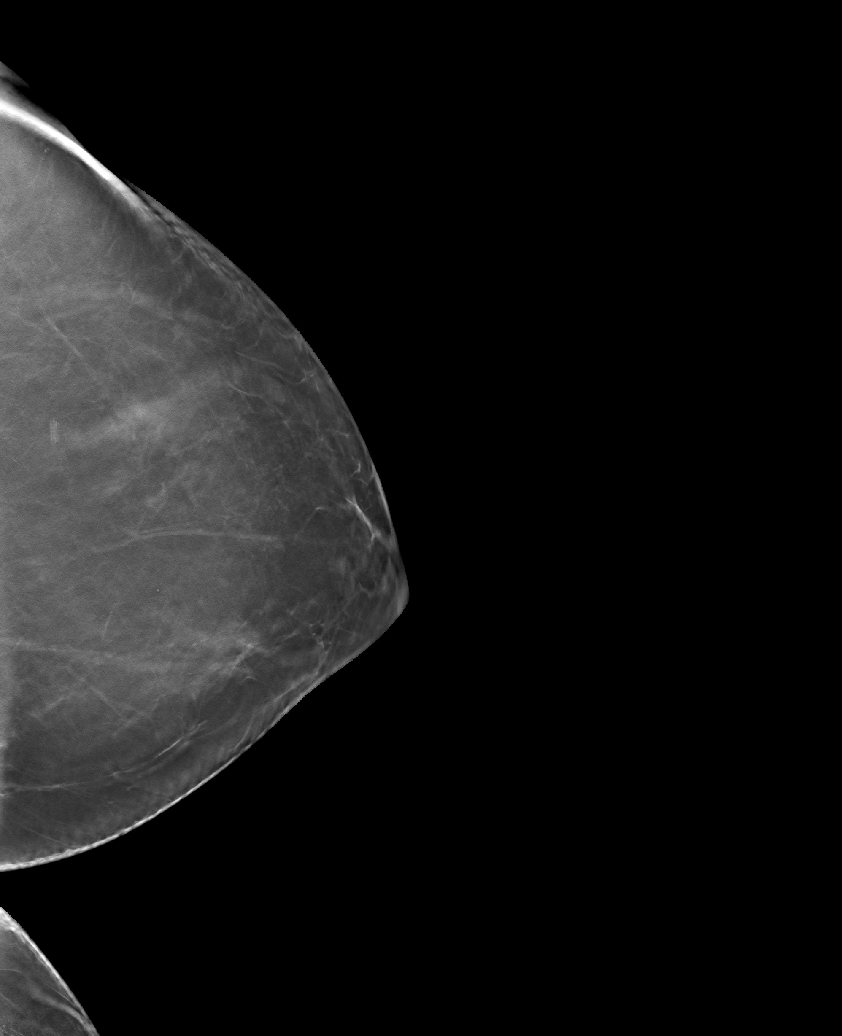

[6 of 30 positions shown; findings below may reference images not displayed]

ACR Breast Density Category b: There are scattered areas of
fibroglandular density.
FINDINGS: There are no findings suspicious for malignancy.
IMPRESSION: No mammographic evidence of malignancy. A result letter of this
screening mammogram will be mailed directly to the patient.

RECOMMENDATION:
Screening mammogram in one year. (Code:51-O-LD2)

BI-RADS CATEGORY  1: Negative.

## 2023-07-24 ENCOUNTER — Encounter (HOSPITAL_COMMUNITY): Payer: Medicare HMO

## 2023-07-25 ENCOUNTER — Telehealth (INDEPENDENT_AMBULATORY_CARE_PROVIDER_SITE_OTHER): Payer: Medicare HMO | Admitting: Internal Medicine

## 2023-07-25 ENCOUNTER — Encounter: Payer: Self-pay | Admitting: Internal Medicine

## 2023-07-25 ENCOUNTER — Encounter: Payer: Self-pay | Admitting: Nurse Practitioner

## 2023-07-25 DIAGNOSIS — I739 Peripheral vascular disease, unspecified: Secondary | ICD-10-CM | POA: Insufficient documentation

## 2023-07-25 NOTE — Patient Instructions (Signed)
You are being scheduled to get [place] ABI of peripheral vascular disease.  Please take Aspirin 81 mg once daily and continue taking Atorvastatin as prescribed.

## 2023-07-25 NOTE — Assessment & Plan Note (Signed)
Considering her recent PAD screening test at home and other chronic medical conditions including type II DM, HTN and HLD - ordered US ABI Advised to start taking aspirin 81 mg QD Continue atorvastatin 40 mg QD

## 2023-07-25 NOTE — Progress Notes (Signed)
Virtual Visit via Video Note   Because of Monique Warren's co-morbid illnesses, she is at least at moderate risk for complications without adequate follow up.  This format is felt to be most appropriate for this patient at this time.  All issues noted in this document were discussed and addressed.  A limited physical exam was performed with this format.      Evaluation Performed:  Follow-up visit  Date:  07/25/2023   ID:  Monique Warren, DOB [birth year], MRN [id number]  Patient Location: Home Provider Location: Office/Clinic  Participants: Patient Location of Patient: Home Location of Provider: Telehealth Consent was obtain for visit to be over via telehealth. I verified that I am speaking with the correct person using two identifiers.  PCP:  Anabel Halon, MD   Chief Complaint: PAD screening test discussion  History of Present Illness:    Monique Warren is a 72 y.o. female with PMH of hypertension, cerebral artery aneurysm s/p clip, migraine, asthma, OSA, celiac disease, GERD, type II DM, osteoporosis, depression with anxiety and obesity who has a video visit for discussion of recent home visit for PAD screening.  She recently was found to have abnormal PAD screening test at home, which was reported by home nurse visit (report in scanning/media).  She has chronic pain of bilateral LE, was attributable to arthritis.  Pain is dull and worse upon exertion.  Denies cold sensation or pallor of LE.  The patient does not have symptoms concerning for COVID-19 infection (fever, chills, cough, or new shortness of breath).   Past Medical, Surgical, Social History, Allergies, and Medications have been Reviewed.  Past Medical History:  Diagnosis Date   Anxiety    Arthritis    Asthma    Celiac disease    Depression    Diabetes mellitus, type II (HCC)    Diabetic neuropathy (HCC)    Diastolic dysfunction    Grade 1 with preserved EF   Dysrhythmia    GERD  (gastroesophageal reflux disease)    Heart murmur    Hiatal hernia    Sliding   Hyperlipidemia    Hypertension    Migraine    Obstructive sleep apnea    Osteoporosis    Vertigo    Past Surgical History:  Procedure Laterality Date   ABDOMINAL HYSTERECTOMY  09/29/2003   APPENDECTOMY     BALLOON DILATION N/A 02/08/2022   Procedure: BALLOON DILATION;  Surgeon: Lanelle Bal, DO;  [facility];  Service: Endoscopy;  Laterality: N/A;   BIOPSY  02/08/2022   Procedure: BIOPSY;  Surgeon: Lanelle Bal, DO;  [facility];  Service: Endoscopy;;   BIOPSY  07/11/2023   Procedure: BIOPSY;  Surgeon: Lanelle Bal, DO;  [facility];  Service: Endoscopy;;   BREAST BIOPSY Left    CARPAL TUNNEL RELEASE     CATARACT EXTRACTION Bilateral    October and November 2016   COLONOSCOPY  09/2017   Blake Divine, MD in [place]; 5 mm tubular adenoma in the descending colon s/p resected, mild sigmoid diverticulosis, internal hemorrhoids.  Recommended repeat colonoscopy in 5 years.   COLONOSCOPY WITH PROPOFOL N/A 07/11/2023   Procedure: COLONOSCOPY WITH PROPOFOL;  Surgeon: Lanelle Bal, DO;  [facility];  Service: Endoscopy;  Laterality: N/A;  11:30 am, asa 3   CRANIOTOMY FOR ANEURYSM / VERTEBROBASILAR / CAROTID CIRCULATION Right 10/24/2015   ESOPHAGOGASTRODUODENOSCOPY  09/2017   [name]; irregular Z-line  s/p biopsy, decreased LES tone, 4 cm hiatal hernia, erythema in gastric antrum biopsied, normal examined duodenum biopsied.  Esophageal biopsy suggestive of reflux, no Barrett's, gastric biopsy with nonspecific chronic gastritis with intestinal metaplasia, duodenal biopsy with increased intraepithelial lymphocytes without blunting of villi, nonspecific.   ESOPHAGOGASTRODUODENOSCOPY (EGD) WITH PROPOFOL N/A 02/08/2022   Surgeon: Lanelle Bal, DO; 2 cm hiatal hernia, short segment Barrett's esophagus without dysplasia, mild Schatzki's ring dilated,  gastritis with biopsies benign, normal examined duodenum.  Repeat EGD in 5 years.   ESOPHAGOGASTRODUODENOSCOPY (EGD) WITH PROPOFOL N/A 07/11/2023   Procedure: ESOPHAGOGASTRODUODENOSCOPY (EGD) WITH PROPOFOL;  Surgeon: Lanelle Bal, DO;  [facility];  Service: Endoscopy;  Laterality: N/A;   KNEE ARTHROSCOPY WITH MEDIAL MENISECTOMY Left 04/09/2022   Procedure: KNEE ARTHROSCOPY WITH PARTIAL MEDIAL MENISCECTOMY, LATERAL MENISCAL DEBRIDEMENT;  Surgeon: Vickki Hearing, MD;  [facility]: [facility];  Service: Orthopedics;  Laterality: Left;   KNEE ARTHROSCOPY WITH MENISCAL REPAIR Left 04/09/2022   Procedure: KNEE ARTHROSCOPY WITH MEDIAL MENISCAL REPAIR;  Surgeon: Vickki Hearing, MD;  [facility];  Service: Orthopedics;  Laterality: Left;   LAPAROSCOPY     POLYPECTOMY  07/11/2023   Procedure: POLYPECTOMY;  Surgeon: Lanelle Bal, DO;  [facility];  Service: Endoscopy;;   TOTAL KNEE ARTHROPLASTY Right 12/06/2022   Procedure: TOTAL KNEE ARTHROPLASTY;  Surgeon: Vickki Hearing, MD;  [facility]: [facility];  Service: Orthopedics;  Laterality: Right;   UTERINE FIBROID SURGERY     WRIST SURGERY Bilateral      No outpatient medications have been marked as taking for the 07/25/23 encounter (Video Visit) with Anabel Halon, MD.   Current Facility-Administered Medications for the 07/25/23 encounter (Video Visit) with Anabel Halon, MD  Medication   bupivacaine-meloxicam ER (ZYNRELEF) injection 400 mg   bupivacaine-meloxicam ER (ZYNRELEF) injection 400 mg     Allergies:   Cefuroxime, Codeine, Morphine, Sulfa antibiotics, Sulfamethoxazole-trimethoprim, and Gluten meal   ROS:   Please see the history of present illness.    All other systems reviewed and are negative.   Labs/Other Tests and Data Reviewed:    Recent Labs: 06/16/2023: ALT 22; BUN 14; Creatinine, Ser 0.87; Hemoglobin 14.8; Platelets 328; Potassium 4.6; Sodium 142; TSH 1.520   Recent Lipid Panel Lab  Results  Component Value Date/Time   CHOL 202 (H) 06/16/2023 11:29 AM   TRIG 137 06/16/2023 11:29 AM   HDL 51 06/16/2023 11:29 AM   CHOLHDL 4.0 06/16/2023 11:29 AM   LDLCALC 127 (H) 06/16/2023 11:29 AM    Wt Readings from Last 3 Encounters:  07/09/23 186 lb 4.6 oz (84.5 kg)  06/16/23 186 lb 3.2 oz (84.5 kg)  06/11/23 184 lb 9.6 oz (83.7 kg)     Objective:    Vital Signs:  There were no vitals taken for this visit.   VITAL SIGNS:  reviewed GEN:  no acute distress EYES:  sclerae anicteric, EOMI - Extraocular Movements Intact RESPIRATORY:  normal respiratory effort, symmetric expansion NEURO:  alert and oriented x 3, no obvious focal deficit PSYCH:  normal affect  ASSESSMENT & PLAN:    PVD (peripheral vascular disease) (HCC) Considering her recent PAD screening test at home and other chronic medical conditions including type II DM, HTN and HLD - ordered US ABI Advised to start taking aspirin 81 mg QD Continue atorvastatin 40 mg QD   I discussed the assessment and treatment plan with the patient. The patient was provided an opportunity to ask questions,  and all were answered. The patient agreed with the plan and demonstrated an understanding of the instructions.   The patient was advised to call back or seek an in-person evaluation if the symptoms worsen or if the condition fails to improve as anticipated.  The above assessment and management plan was discussed with the patient. The patient verbalized understanding of and has agreed to the management plan.   Medication Adjustments/Labs and Tests Ordered: Current medicines are reviewed at length with the patient today.  Concerns regarding medicines are outlined above.   Tests Ordered: No orders of the defined types were placed in this encounter.   Medication Changes: No orders of the defined types were placed in this encounter.    Note: This dictation was prepared with Dragon dictation along with smaller phrase  technology. Similar sounding words can be transcribed inadequately or may not be corrected upon review. Any transcriptional errors that result from this process are unintentional.      Disposition:  Follow up  Signed, Anabel Halon, MD  07/25/2023 9:10 AM     [facility]

## 2023-07-27 IMAGING — DX DG HAND COMPLETE 3+V*R*
3 series · 3 of 3 positions shown · non-contrast
Comparison: None.

CLINICAL DATA: Fall.

EXAM:
RIGHT HAND - COMPLETE 3+ VIEW

[hand ap]
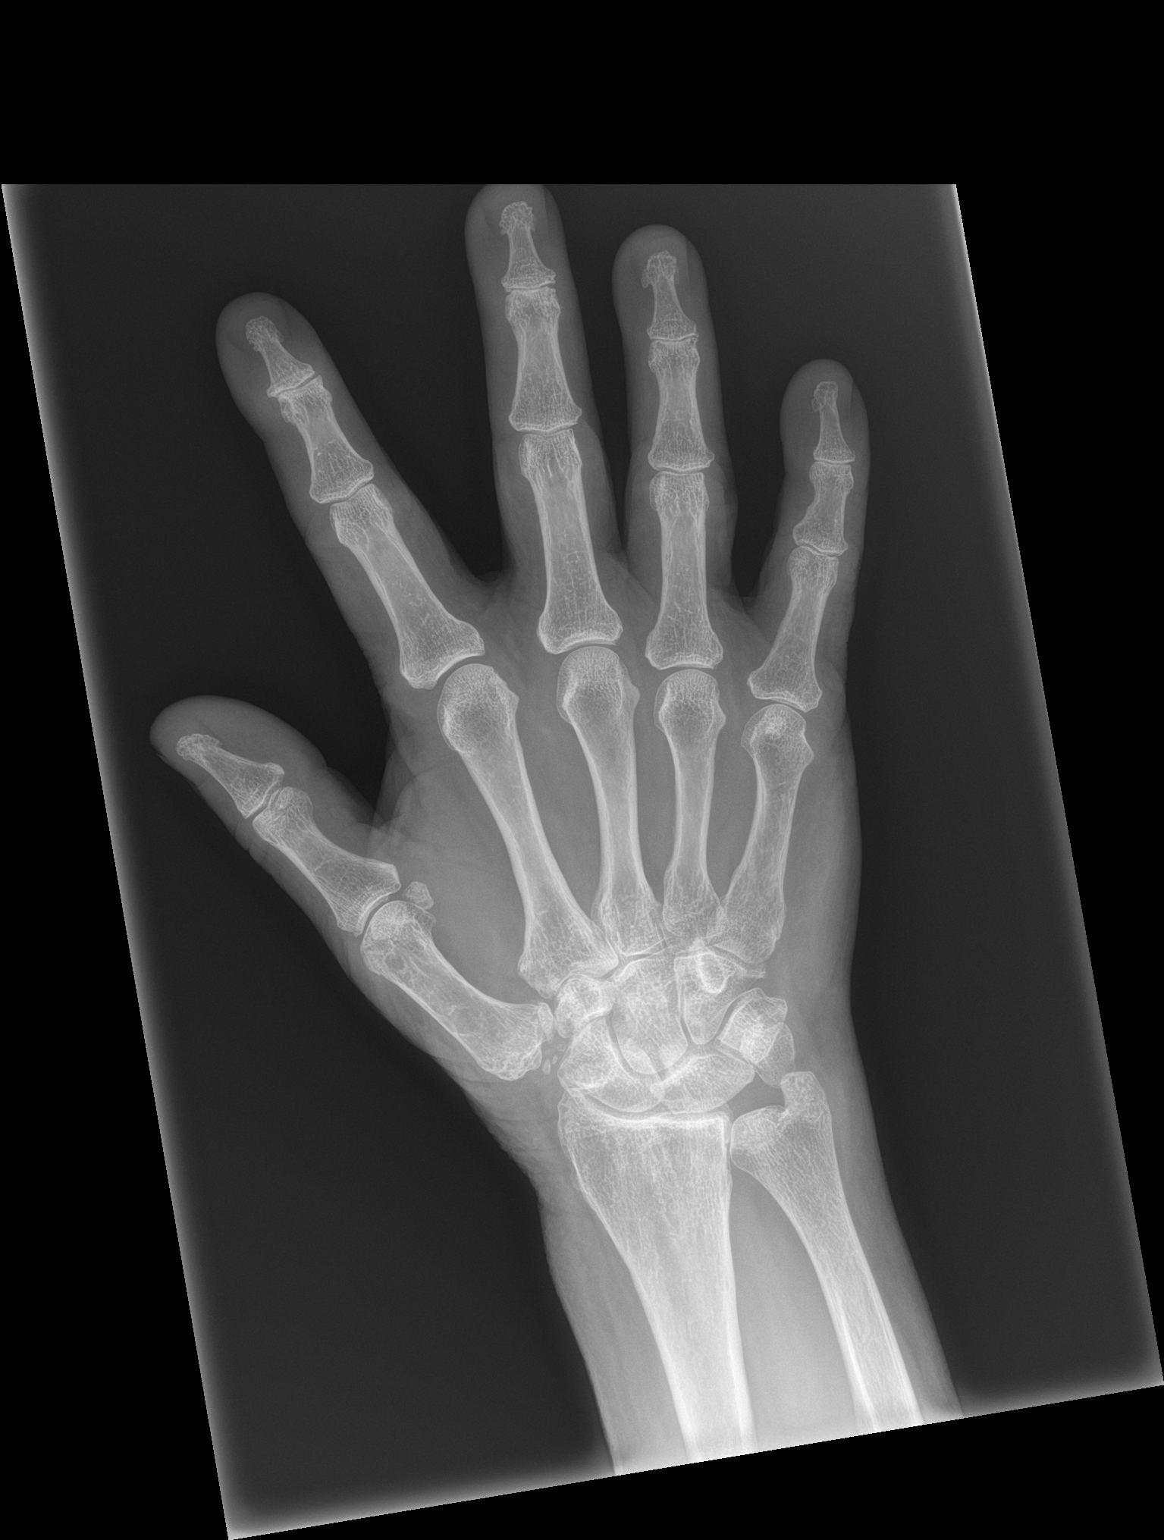

[hand obl]
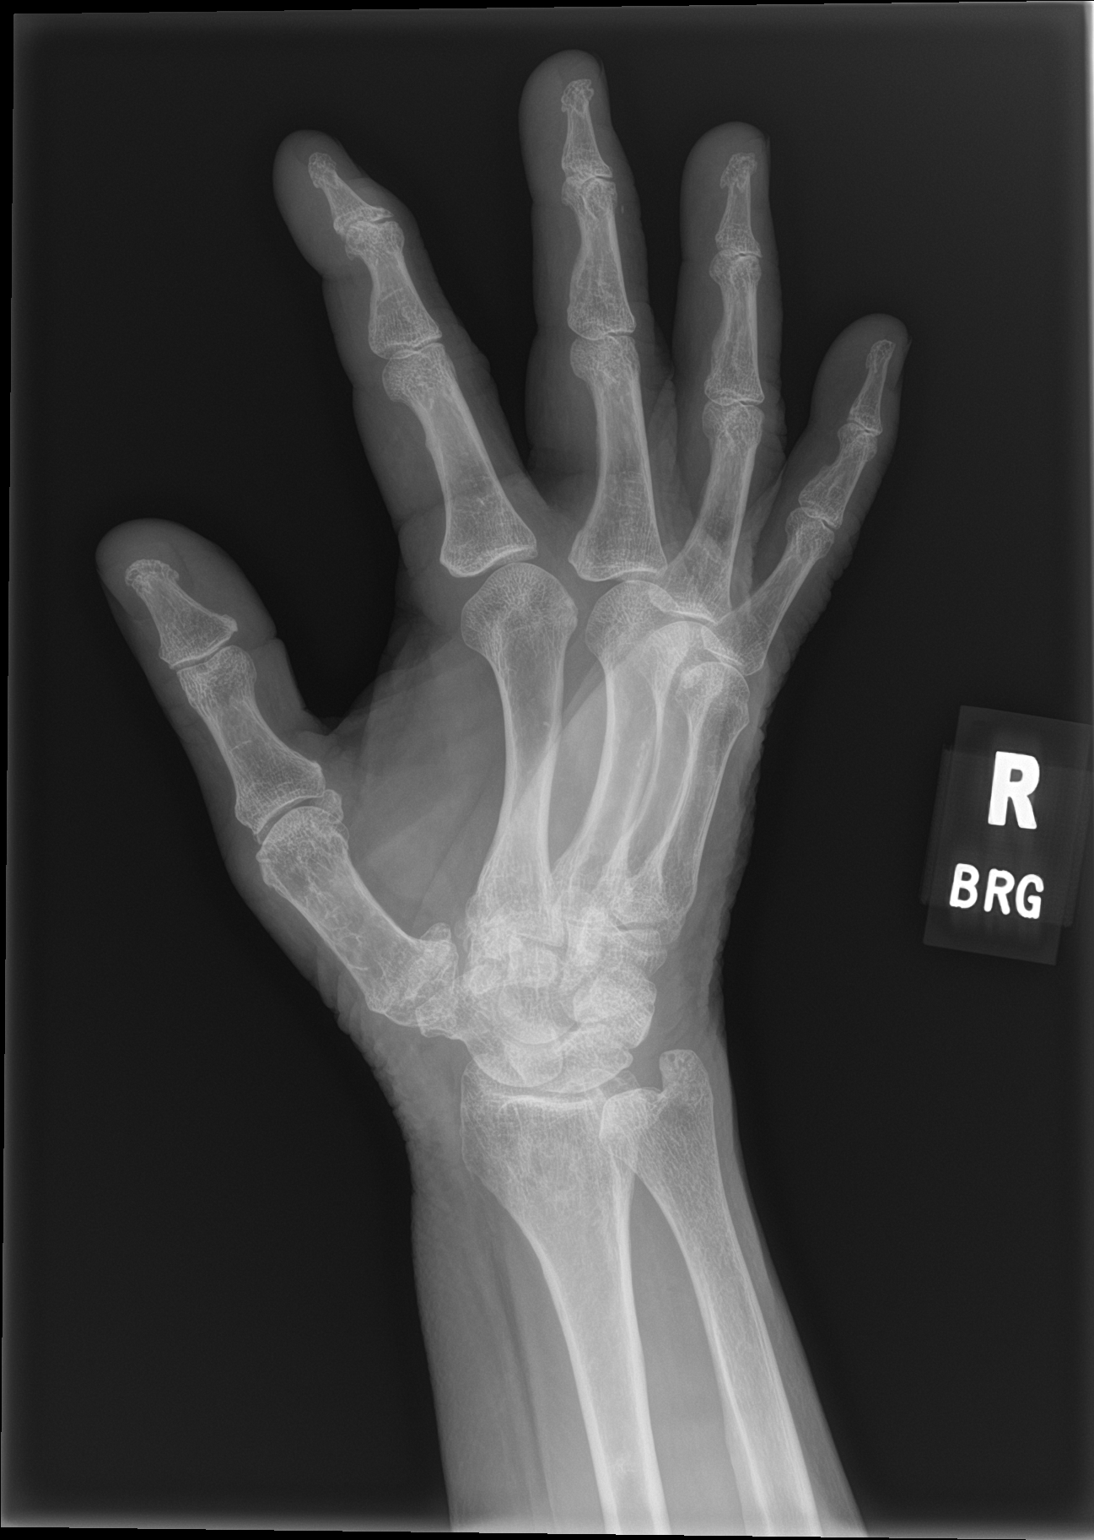

[hand lat]
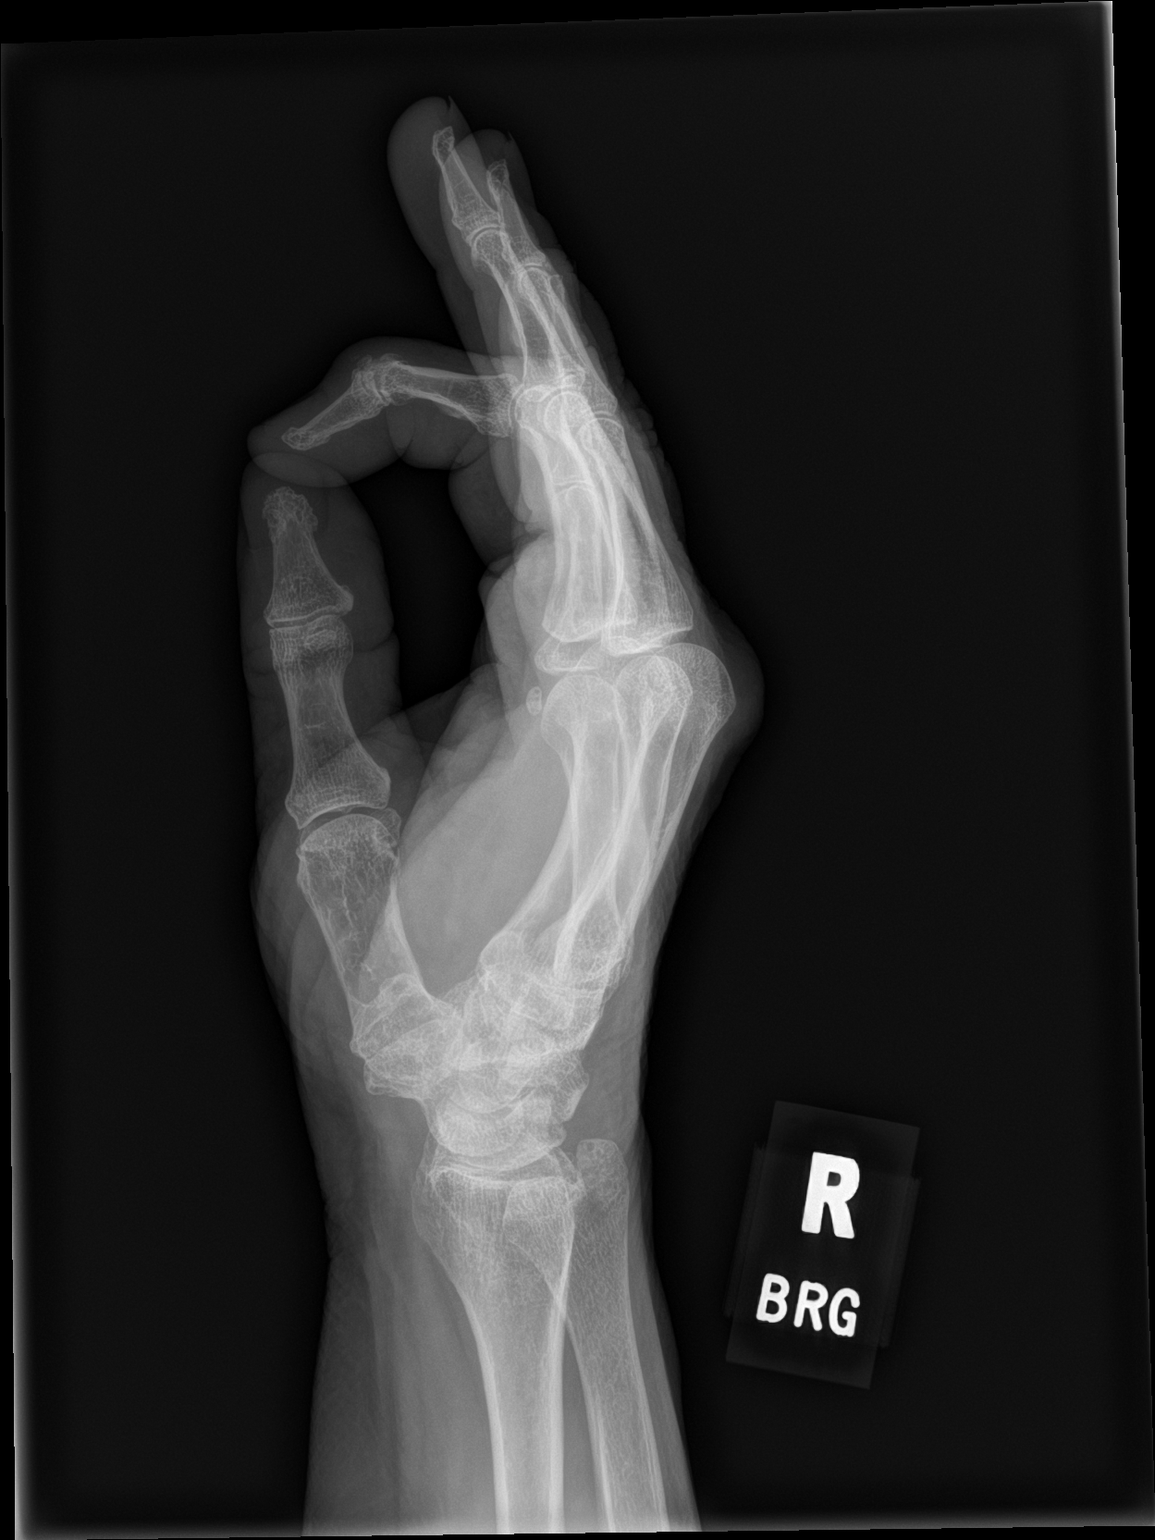

[3 of 3 positions shown; findings below may reference images not displayed]

FINDINGS: There is no evidence of fracture or dislocation. There is
degenerative narrowing and osteophyte formation at the first
carpometacarpal joint. There is soft tissue swelling over the dorsal
metacarpal heads. There is no radiopaque foreign body.
IMPRESSION: No acute bony abnormality.

## 2023-07-28 MED ORDER — LANTUS SOLOSTAR 100 UNIT/ML ~~LOC~~ SOPN: 15.0000 [IU] | PEN_INJECTOR | Freq: Every day | SUBCUTANEOUS | 3 refills | Status: DC

## 2023-07-28 MED ORDER — PEN NEEDLES 31G X 8 MM MISC: 3 refills | Status: DC

## 2023-07-28 NOTE — Addendum Note (Signed)
Addended by: Dani Gobble on: 07/28/2023 12:01 PM   Modules accepted: Orders

## 2023-07-29 ENCOUNTER — Ambulatory Visit (HOSPITAL_COMMUNITY)
Admission: RE | Admit: 2023-07-29 | Discharge: 2023-07-29 | Disposition: A | Discharge status: Home / Self Care | Payer: Medicare HMO | Source: Ambulatory Visit | Attending: Internal Medicine | Admitting: Internal Medicine

## 2023-07-29 DIAGNOSIS — M79605 Pain in left leg: Secondary | ICD-10-CM | POA: Diagnosis not present

## 2023-07-29 DIAGNOSIS — I739 Peripheral vascular disease, unspecified: Secondary | ICD-10-CM | POA: Insufficient documentation

## 2023-07-29 DIAGNOSIS — M79604 Pain in right leg: Secondary | ICD-10-CM | POA: Diagnosis not present

## 2023-08-19 ENCOUNTER — Other Ambulatory Visit: Payer: Self-pay | Admitting: Internal Medicine

## 2023-08-19 DIAGNOSIS — G4733 Obstructive sleep apnea (adult) (pediatric): Secondary | ICD-10-CM

## 2023-08-22 ENCOUNTER — Other Ambulatory Visit: Payer: Self-pay | Admitting: Medical Genetics

## 2023-08-26 ENCOUNTER — Other Ambulatory Visit (HOSPITAL_COMMUNITY)
Admission: RE | Admit: 2023-08-26 | Discharge: 2023-08-26 | Disposition: A | Discharge status: Home / Self Care | Payer: Medicare HMO | Source: Ambulatory Visit | Attending: Medical Genetics | Admitting: Medical Genetics

## 2023-08-26 ENCOUNTER — Other Ambulatory Visit (HOSPITAL_COMMUNITY): Payer: Self-pay | Admitting: Internal Medicine

## 2023-08-26 DIAGNOSIS — Z1231 Encounter for screening mammogram for malignant neoplasm of breast: Secondary | ICD-10-CM

## 2023-08-31 NOTE — Progress Notes (Unsigned)
Referring Provider: Anabel Halon, MD Primary Care Physician:  Anabel Halon, MD Primary GI Physician: Dr. Marletta Lor  No chief complaint on file.   HPI:   Monique Warren is a 72 y.o. female with a history of of type 2 diabetes, HTN, HLD, OSA, grade 1 diastolic dysfunction with preserved EF, possible celiac disease with duodenal  biopsies non-specific in 2019, Barrett's esophagus, GERD, dysphagia, constipation, adenomatous colon polyps, fatty liver, elevated liver enzymes previously with normalization in September 2023, presenting today for follow-up of GERD/chest pain and constipation.  Last seen in our office 06/11/2023.  Chronic dysphagia, unchanged, and plan to monitor.  GERD was uncontrolled with daily symptoms on Aciphex 20 mg daily, associated substernal chest pain.  Had seen cardiology who suspected GI etiology.  Regarding her questionable history of celiac disease, she had not recently been following a strict gluten-free diet and it was unclear if this was contributing to her upper GI symptoms.  Her chronic constipation was not adequately managed as she was not taking anything.  Associated lower abdominal pain.  Plan included increasing Aciphex to 20 mg twice daily, check TTG IgA and IgA, start Linzess 72 mcg daily, colonoscopy for surveillance purposes.  TTG IgA and IgA were within normal limits.  Patient asked if she could have EGD at the time of colonoscopy, so this was arranged.  Colonoscopy 07/11/2023: Nonbleeding internal hemorrhoids, diverticulosis in the sigmoid colon, 5 mm polyp in the transverse colon resected and retrieved, 8 mm polyp in the sigmoid colon resected and retrieved, single nonbleeding angiodysplastic lesion.  Pathology with 1 tubular adenoma and 1 hyperplastic polyp.  Recommended 7-year surveillance.  EGD 07/11/2023: 2 cm hiatal hernia, mucosal changes secondary to short segment Barrett's esophagus biopsied, esophageal mucosal changes suspicious for EOE  biopsied and dilated, gastritis biopsied.  Recommended PPI twice daily.  Gastric biopsies benign, all esophageal biopsies benign.  Today: GERD:  Dysphagia:  Constipation:    ***?  pH/manometry  Past Medical History:  Diagnosis Date   Anxiety    Arthritis    Asthma    Celiac disease    Depression    Diabetes mellitus, type II (HCC)    Diabetic neuropathy (HCC)    Diastolic dysfunction    Grade 1 with preserved EF   Dysrhythmia    GERD (gastroesophageal reflux disease)    Heart murmur    Hiatal hernia    Sliding   Hyperlipidemia    Hypertension    Migraine    Obstructive sleep apnea    Osteoporosis    Vertigo     Past Surgical History:  Procedure Laterality Date   ABDOMINAL HYSTERECTOMY  09/29/2003   APPENDECTOMY     BALLOON DILATION N/A 02/08/2022   Procedure: BALLOON DILATION;  Surgeon: Lanelle Bal, DO;  [facility];  Service: Endoscopy;  Laterality: N/A;   BIOPSY  02/08/2022   Procedure: BIOPSY;  Surgeon: Lanelle Bal, DO;  [facility];  Service: Endoscopy;;   BIOPSY  07/11/2023   Procedure: BIOPSY;  Surgeon: Lanelle Bal, DO;  [facility];  Service: Endoscopy;;   BREAST BIOPSY Left    CARPAL TUNNEL RELEASE     CATARACT EXTRACTION Bilateral    October and November 2016   COLONOSCOPY  09/2017   Blake Divine, MD in [place]; 5 mm tubular adenoma in the descending colon s/p resected, mild sigmoid diverticulosis, internal hemorrhoids.  Recommended repeat colonoscopy in 5 years.   COLONOSCOPY  WITH PROPOFOL N/A 07/11/2023   Procedure: COLONOSCOPY WITH PROPOFOL;  Surgeon: Lanelle Bal, DO;  [facility];  Service: Endoscopy;  Laterality: N/A;  11:30 am, asa 3   CRANIOTOMY FOR ANEURYSM / VERTEBROBASILAR / CAROTID CIRCULATION Right 10/24/2015   ESOPHAGOGASTRODUODENOSCOPY  09/2017   [name]; irregular Z-line s/p biopsy, decreased LES tone, 4 cm hiatal hernia, erythema in gastric antrum biopsied, normal  examined duodenum biopsied.  Esophageal biopsy suggestive of reflux, no Barrett's, gastric biopsy with nonspecific chronic gastritis with intestinal metaplasia, duodenal biopsy with increased intraepithelial lymphocytes without blunting of villi, nonspecific.   ESOPHAGOGASTRODUODENOSCOPY (EGD) WITH PROPOFOL N/A 02/08/2022   Surgeon: Lanelle Bal, DO; 2 cm hiatal hernia, short segment Barrett's esophagus without dysplasia, mild Schatzki's ring dilated, gastritis with biopsies benign, normal examined duodenum.  Repeat EGD in 5 years.   ESOPHAGOGASTRODUODENOSCOPY (EGD) WITH PROPOFOL N/A 07/11/2023   Procedure: ESOPHAGOGASTRODUODENOSCOPY (EGD) WITH PROPOFOL;  Surgeon: Lanelle Bal, DO;  [facility];  Service: Endoscopy;  Laterality: N/A;   KNEE ARTHROSCOPY WITH MEDIAL MENISECTOMY Left 04/09/2022   Procedure: KNEE ARTHROSCOPY WITH PARTIAL MEDIAL MENISCECTOMY, LATERAL MENISCAL DEBRIDEMENT;  Surgeon: Vickki Hearing, MD;  Location: [facility];  Service: Orthopedics;  Laterality: Left;   KNEE ARTHROSCOPY WITH MENISCAL REPAIR Left 04/09/2022   Procedure: KNEE ARTHROSCOPY WITH MEDIAL MENISCAL REPAIR;  Surgeon: Vickki Hearing, MD;  Location: [facility];  Service: Orthopedics;  Laterality: Left;   LAPAROSCOPY     POLYPECTOMY  07/11/2023   Procedure: POLYPECTOMY;  Surgeon: Lanelle Bal, DO;  [facility];  Service: Endoscopy;;   TOTAL KNEE ARTHROPLASTY Right 12/06/2022   Procedure: TOTAL KNEE ARTHROPLASTY;  Surgeon: Vickki Hearing, MD;  [facility];  Service: Orthopedics;  Laterality: Right;   UTERINE FIBROID SURGERY     WRIST SURGERY Bilateral     Current Outpatient Medications  Medication Sig Dispense Refill   acetaminophen (TYLENOL) 500 MG tablet Take 1 tablet (500 mg total) by mouth every 6 (six) hours as needed for moderate pain. 30 tablet 0   AIMOVIG 140 MG/ML SOAJ INJECT 140 MG UNDER THE SKIN EVERY 30 DAYS 1 mL 11   albuterol (VENTOLIN HFA) 108 (90  Base) MCG/ACT inhaler USE 2 INHALATIONS EVERY 6 HOURS AS NEEDED FOR WHEEZING OR SHORTNESS OF BREATH 8.5 g 10   atorvastatin (LIPITOR) 40 MG tablet TAKE 1 TABLET AT BEDTIME 90 tablet 3   Blood Glucose Monitoring Suppl (ONE TOUCH ULTRA 2) w/Device KIT USE TO CHECK BLOOD GLUCOSE 4 TIMES A DAY 1 kit 0   Cholecalciferol (VITAMIN D3) 125 MCG (5000 UT) capsule Take 5,000 Units by mouth daily.     clindamycin (CLEOCIN) 150 MG capsule Take 1 - 30 min prior to dental work 5 capsule 1   diltiazem (CARDIZEM SR) 120 MG 12 hr capsule TAKE 2 CAPSULES DAILY 180 capsule 3   docusate sodium (COLACE) 100 MG capsule Take 1 capsule (100 mg total) by mouth 2 (two) times daily. (Patient not taking: Reported on 06/11/2023) 10 capsule 0   DULoxetine (CYMBALTA) 60 MG capsule Take 1 capsule (60 mg total) by mouth daily. 90 capsule 3   Estradiol 10 MCG TABS vaginal tablet Place 10 mcg vaginally every Monday.     famotidine (PEPCID) 40 MG tablet Take 1 tablet (40 mg total) by mouth daily. 90 tablet 3   Fluticasone-Salmeterol,sensor, (AIRDUO DIGIHALER) 232-14 MCG/ACT AEPB Inhale 1 Inhalation into the lungs 2 (two) times daily. USE 1 INHALATION TWICE A DAY  Strength: 232-14 MCG/ACT 1 each 11   gabapentin (NEURONTIN) 300 MG capsule Take 1 capsule (300 mg total) by mouth at bedtime. 90 capsule 3   insulin glargine (LANTUS SOLOSTAR) 100 UNIT/ML Solostar Pen Inject 15 Units into the skin at bedtime. 15 mL 3   Insulin Pen Needle (PEN NEEDLES) 31G X 8 MM MISC Use to inject insulin once daily 100 each 3   Lancets (ONETOUCH DELICA PLUS LANCET33G) MISC Use to check glucose once daily 100 each 6   linaclotide (LINZESS) 72 MCG capsule Take 1 capsule (72 mcg total) by mouth daily before breakfast. 30 capsule 3   meclizine (ANTIVERT) 25 MG tablet Take 1 tablet (25 mg total) by mouth 3 (three) times daily as needed for dizziness. 30 tablet 0   Multiple Vitamin (MULTIVITAMIN WITH MINERALS) TABS tablet Take 1 tablet by mouth daily.     ONETOUCH  ULTRA test strip Use to monitor glucose once daily 100 each 6   pantoprazole (PROTONIX) 40 MG tablet Take 1 tablet (40 mg total) by mouth 2 (two) times daily. 60 tablet 11   PROLIA 60 MG/ML SOSY injection INJECT 60 MG UNDER THE SKIN EVERY 6 MONTHS 1 mL 1   promethazine (PHENERGAN) 12.5 MG tablet Take 1 tablet (12.5 mg total) by mouth every 6 (six) hours as needed for nausea or vomiting. 30 tablet 0   Semaglutide, 2 MG/DOSE, 8 MG/3ML SOPN Inject 2 mg as directed once a week. 6 mL 3   traMADol (ULTRAM) 50 MG tablet Take 1 tablet (50 mg total) by mouth every 12 (twelve) hours as needed. 30 tablet 0   triamcinolone cream (KENALOG) 0.1 % Apply 1 Application topically 2 (two) times daily. 30 g 0   venlafaxine (EFFEXOR) 37.5 MG tablet TAKE 1 TABLET TWICE A DAY 60 tablet 3   zolmitriptan (ZOMIG) 5 MG tablet Take 5 mg by mouth daily as needed for migraine.     Current Facility-Administered Medications  Medication Dose Route Frequency Provider Last Rate Last Admin   bupivacaine-meloxicam ER (ZYNRELEF) injection 400 mg  400 mg Infiltration Once Vickki Hearing, MD       bupivacaine-meloxicam ER (ZYNRELEF) injection 400 mg  400 mg Infiltration Once Vickki Hearing, MD        Allergies as of 09/01/2023 - Review Complete 07/25/2023  Allergen Reaction Noted   Cefuroxime Anaphylaxis, Hives, Shortness Of Breath, Swelling, and Anxiety 09/16/2006   Codeine Hives, Shortness Of Breath, Nausea And Vomiting, Swelling, and Anxiety 09/16/2006   Morphine Anaphylaxis 07/09/2023   Sulfa antibiotics Anaphylaxis, Hives, Nausea And Vomiting, and Swelling 09/02/2017   Sulfamethoxazole-trimethoprim Shortness Of Breath, Swelling, and Anxiety 09/16/2006   Gluten meal  02/05/2022    Family History  Problem Relation Age of Onset   Breast cancer Mother    Thyroid disease Mother    Stroke Mother    Hypertension Father    Hyperlipidemia Father    Heart attack Father    Heart failure Father    Breast cancer  Paternal Grandmother    Cancer - Colon Neg Hx    Gastric cancer Neg Hx    Esophageal cancer Neg Hx    Liver cancer Neg Hx    Autoimmune disease Neg Hx     Social History   Socioeconomic History   Marital status: Married    Spouse name: Not on file   Number of children: Not on file   Years of education: Not on file   Highest education level: GED or  equivalent  Occupational History   Not on file  Tobacco Use   Smoking status: Former    Current packs/day: 0.50    Types: Cigarettes   Smokeless tobacco: Never  Vaping Use   Vaping status: Never Used  Substance and Sexual Activity   Alcohol use: Yes    Alcohol/week: 4.0 standard drinks of alcohol    Types: 2 Glasses of wine, 2 Shots of liquor per week    Comment: rarely   Drug use: Never   Sexual activity: Not on file  Other Topics Concern   Not on file  Social History Narrative   Not on file   Social Drivers of Health   Financial Resource Strain: Low Risk  (01/01/2023)   Overall Financial Resource Strain (CARDIA)    Difficulty of Paying Living Expenses: Not hard at all  Food Insecurity: No Food Insecurity (01/01/2023)   Hunger Vital Sign    Worried About Running Out of Food in the Last Year: Never true    Ran Out of Food in the Last Year: Never true  Transportation Needs: No Transportation Needs (01/01/2023)   PRAPARE - Administrator, Civil Service (Medical): No    Lack of Transportation (Non-Medical): No  Physical Activity: Unknown (01/01/2023)   Exercise Vital Sign    Days of Exercise per Week: 0 days    Minutes of Exercise per Session: Not on file  Stress: Stress Concern Present (01/01/2023)   [employer] of Occupational Health - Occupational Stress Questionnaire    Feeling of Stress : To some extent  Social Connections: Moderately Integrated (01/01/2023)   Social Connection and Isolation Panel [NHANES]    Frequency of Communication with Friends and Family: More than three times a week    Frequency  of Social Gatherings with Friends and Family: More than three times a week    Attends Religious Services: 1 to 4 times per year    Active Member of [employer] or Organizations: No    Attends Engineer, structural: Not on file    Marital Status: Married    Review of Systems: Gen: Denies fever, chills, anorexia. Denies fatigue, weakness, weight loss.  CV: Denies chest pain, palpitations, syncope, peripheral edema, and claudication. Resp: Denies dyspnea at rest, cough, wheezing, coughing up blood, and pleurisy. GI: Denies vomiting blood, jaundice, and fecal incontinence.   Denies dysphagia or odynophagia. Derm: Denies rash, itching, dry skin Psych: Denies depression, anxiety, memory loss, confusion. No homicidal or suicidal ideation.  Heme: Denies bruising, bleeding, and enlarged lymph nodes.  Physical Exam: There were no vitals taken for this visit. General:   Alert and oriented. No distress noted. Pleasant and cooperative.  Head:  Normocephalic and atraumatic. Eyes:  Conjuctiva clear without scleral icterus. Heart:  S1, S2 present without murmurs appreciated. Lungs:  Clear to auscultation bilaterally. No wheezes, rales, or rhonchi. No distress.  Abdomen:  +BS, soft, non-tender and non-distended. No rebound or guarding. No HSM or masses noted. Msk:  Symmetrical without gross deformities. Normal posture. Extremities:  Without edema. Neurologic:  Alert and  oriented x4 Psych:  Normal mood and affect.    Assessment:     Plan:  ***   Ermalinda Memos, PA-C [facility] 09/01/2023

## 2023-09-01 ENCOUNTER — Encounter: Payer: Self-pay | Admitting: *Deleted

## 2023-09-01 ENCOUNTER — Ambulatory Visit: Payer: Medicare HMO | Admitting: Gastroenterology

## 2023-09-01 ENCOUNTER — Encounter: Payer: Self-pay | Admitting: Gastroenterology

## 2023-09-01 VITALS — BP 120/80 | HR 103 | Temp 98.3°F | Ht 62.0 in | Wt 197.0 lb

## 2023-09-01 DIAGNOSIS — R1310 Dysphagia, unspecified: Secondary | ICD-10-CM | POA: Diagnosis not present

## 2023-09-01 DIAGNOSIS — K5909 Other constipation: Secondary | ICD-10-CM

## 2023-09-01 DIAGNOSIS — R6881 Early satiety: Secondary | ICD-10-CM

## 2023-09-01 DIAGNOSIS — K219 Gastro-esophageal reflux disease without esophagitis: Secondary | ICD-10-CM | POA: Diagnosis not present

## 2023-09-01 DIAGNOSIS — Z8719 Personal history of other diseases of the digestive system: Secondary | ICD-10-CM | POA: Diagnosis not present

## 2023-09-01 DIAGNOSIS — K5900 Constipation, unspecified: Secondary | ICD-10-CM

## 2023-09-01 MED ORDER — ESOMEPRAZOLE MAGNESIUM 40 MG PO CPDR: 40.0000 mg | DELAYED_RELEASE_CAPSULE | Freq: Two times a day (BID) | ORAL | 3 refills | Status: DC

## 2023-09-01 NOTE — Patient Instructions (Signed)
We will get scheduled for a gastric emptying study at [facility].  Stop pantoprazole.  Start Nexium 40 mg twice daily 30 minutes before breakfast and dinner.  Continue taking Pepcid 40 mg at bedtime.  Follow a GERD diet:  Avoid fried, fatty, greasy, spicy, citrus foods. Avoid caffeine and carbonated beverages. Avoid chocolate. Try eating 4-6 small meals a day rather than 3 large meals. Do not eat within 3 hours of laying down. Prop head of bed up on wood or bricks to create a 6 inch incline.  We will follow-up with you in 8 weeks or sooner if needed.  Ermalinda Memos, PA-C [facility]

## 2023-09-01 NOTE — Telephone Encounter (Signed)
CALLED PT, LMOVM. Advised will send GES appt details to [facility]

## 2023-09-04 ENCOUNTER — Encounter (HOSPITAL_COMMUNITY)
Admission: RE | Admit: 2023-09-04 | Discharge: 2023-09-04 | Disposition: A | Discharge status: Home / Self Care | Payer: Medicare HMO | Source: Ambulatory Visit | Attending: Gastroenterology | Admitting: Gastroenterology

## 2023-09-04 ENCOUNTER — Encounter (HOSPITAL_COMMUNITY): Payer: Self-pay

## 2023-09-04 DIAGNOSIS — R6881 Early satiety: Secondary | ICD-10-CM | POA: Diagnosis not present

## 2023-09-04 DIAGNOSIS — K219 Gastro-esophageal reflux disease without esophagitis: Secondary | ICD-10-CM | POA: Insufficient documentation

## 2023-09-04 MED ORDER — TECHNETIUM TC 99M SULFUR COLLOID
2.0000 | Freq: Once | INTRAVENOUS | Status: AC | PRN
  Administered 2023-09-04: 1.95 via INTRAVENOUS

## 2023-09-06 LAB — GENECONNECT MOLECULAR SCREEN: Genetic Analysis Overall Interpretation: NEGATIVE

## 2023-09-15 ENCOUNTER — Ambulatory Visit (INDEPENDENT_AMBULATORY_CARE_PROVIDER_SITE_OTHER): Payer: Medicare HMO | Admitting: Nurse Practitioner

## 2023-09-15 ENCOUNTER — Encounter: Payer: Self-pay | Admitting: Nurse Practitioner

## 2023-09-15 VITALS — BP 125/80 | HR 118 | Ht 62.0 in | Wt 194.6 lb

## 2023-09-15 DIAGNOSIS — Z7985 Long-term (current) use of injectable non-insulin antidiabetic drugs: Secondary | ICD-10-CM | POA: Diagnosis not present

## 2023-09-15 DIAGNOSIS — E1140 Type 2 diabetes mellitus with diabetic neuropathy, unspecified: Secondary | ICD-10-CM

## 2023-09-15 DIAGNOSIS — I10 Essential (primary) hypertension: Secondary | ICD-10-CM

## 2023-09-15 DIAGNOSIS — E559 Vitamin D deficiency, unspecified: Secondary | ICD-10-CM | POA: Diagnosis not present

## 2023-09-15 DIAGNOSIS — E782 Mixed hyperlipidemia: Secondary | ICD-10-CM

## 2023-09-15 LAB — POCT GLYCOSYLATED HEMOGLOBIN (HGB A1C): Hemoglobin A1C: 6.6 % — AB (ref 4.0–5.6)

## 2023-09-15 NOTE — Progress Notes (Signed)
Endocrinology Follow Up Note       09/15/2023, 4:17 PM   Subjective:    Patient ID: Monique Warren, female    DOB: 1951/01/15.  Monique Warren is being seen in follow up after being seen in consultation for management of currently uncontrolled symptomatic diabetes requested by  Anabel Halon, MD.   Past Medical History:  Diagnosis Date   Anxiety    Arthritis    Asthma    Celiac disease    Depression    Diabetes mellitus, type II (HCC)    Diabetic neuropathy (HCC)    Diastolic dysfunction    Grade 1 with preserved EF   Dysrhythmia    GERD (gastroesophageal reflux disease)    Heart murmur    Hiatal hernia    Sliding   Hyperlipidemia    Hypertension    Migraine    Obstructive sleep apnea    Osteoporosis    Vertigo     Past Surgical History:  Procedure Laterality Date   ABDOMINAL HYSTERECTOMY  09/29/2003   APPENDECTOMY     BALLOON DILATION N/A 02/08/2022   Procedure: BALLOON DILATION;  Surgeon: Lanelle Bal, DO;  [facility];  Service: Endoscopy;  Laterality: N/A;   BIOPSY  02/08/2022   Procedure: BIOPSY;  Surgeon: Lanelle Bal, DO;  [facility];  Service: Endoscopy;;   BIOPSY  07/11/2023   Procedure: BIOPSY;  Surgeon: Lanelle Bal, DO;  [facility];  Service: Endoscopy;;   BREAST BIOPSY Left    CARPAL TUNNEL RELEASE     CATARACT EXTRACTION Bilateral    October and November 2016   COLONOSCOPY  09/2017   Blake Divine, MD in [place]; 5 mm tubular adenoma in the descending colon s/p resected, mild sigmoid diverticulosis, internal hemorrhoids.  Recommended repeat colonoscopy in 5 years.   COLONOSCOPY WITH PROPOFOL N/A 07/11/2023   Surgeon: Lanelle Bal, DO; Nonbleeding internal hemorrhoids, diverticulosis in the sigmoid colon, 5 mm polyp in the transverse colon resected and retrieved, 8 mm polyp in the sigmoid colon resected and retrieved,  single nonbleeding angiodysplastic lesion.  Pathology with 1 tubular adenoma and 1 hyperplastic polyp.  Recommended 7-year surveillance.   CRANIOTOMY FOR ANEURYSM / VERTEBROBASILAR / CAROTID CIRCULATION Right 10/24/2015   ESOPHAGOGASTRODUODENOSCOPY  09/2017   [name]; irregular Z-line s/p biopsy, decreased LES tone, 4 cm hiatal hernia, erythema in gastric antrum biopsied, normal examined duodenum biopsied.  Esophageal biopsy suggestive of reflux, no Barrett's, gastric biopsy with nonspecific chronic gastritis with intestinal metaplasia, duodenal biopsy with increased intraepithelial lymphocytes without blunting of villi, nonspecific.   ESOPHAGOGASTRODUODENOSCOPY (EGD) WITH PROPOFOL N/A 02/08/2022   Surgeon: Lanelle Bal, DO; 2 cm hiatal hernia, short segment Barrett's esophagus without dysplasia, mild Schatzki's ring dilated, gastritis with biopsies benign, normal examined duodenum.  Repeat EGD in 5 years.   ESOPHAGOGASTRODUODENOSCOPY (EGD) WITH PROPOFOL N/A 07/11/2023   Surgeon: Lanelle Bal, DO;  cm hiatal hernia, mucosal changes secondary to short segment Barrett's esophagus biopsied, esophageal mucosal changes suspicious for EOE biopsied and dilated, gastritis biopsied. Gastric biopsies benign, all esophageal biopsies benign.   KNEE ARTHROSCOPY WITH MEDIAL MENISECTOMY Left 04/09/2022  Procedure: KNEE ARTHROSCOPY WITH PARTIAL MEDIAL MENISCECTOMY, LATERAL MENISCAL DEBRIDEMENT;  Surgeon: Vickki Hearing, MD;  Location: [facility];  Service: Orthopedics;  Laterality: Left;   KNEE ARTHROSCOPY WITH MENISCAL REPAIR Left 04/09/2022   Procedure: KNEE ARTHROSCOPY WITH MEDIAL MENISCAL REPAIR;  Surgeon: Vickki Hearing, MD;  Location: [facility];  Service: Orthopedics;  Laterality: Left;   LAPAROSCOPY     POLYPECTOMY  07/11/2023   Procedure: POLYPECTOMY;  Surgeon: Lanelle Bal, DO;  [facility];  Service: Endoscopy;;   TOTAL KNEE ARTHROPLASTY Right 12/06/2022   Procedure: TOTAL  KNEE ARTHROPLASTY;  Surgeon: Vickki Hearing, MD;  Location: [facility];  Service: Orthopedics;  Laterality: Right;   UTERINE FIBROID SURGERY     WRIST SURGERY Bilateral     Social History   Socioeconomic History   Marital status: Married    Spouse name: Not on file   Number of children: Not on file   Years of education: Not on file   Highest education level: GED or equivalent  Occupational History   Not on file  Tobacco Use   Smoking status: Former    Current packs/day: 0.50    Types: Cigarettes   Smokeless tobacco: Never  Vaping Use   Vaping status: Never Used  Substance and Sexual Activity   Alcohol use: Yes    Alcohol/week: 4.0 standard drinks of alcohol    Types: 2 Glasses of wine, 2 Shots of liquor per week    Comment: rarely   Drug use: Never   Sexual activity: Not on file  Other Topics Concern   Not on file  Social History Narrative   Not on file   Social Drivers of Health   Financial Resource Strain: Low Risk  (01/01/2023)   Overall Financial Resource Strain (CARDIA)    Difficulty of Paying Living Expenses: Not hard at all  Food Insecurity: No Food Insecurity (01/01/2023)   Hunger Vital Sign    Worried About Running Out of Food in the Last Year: Never true    Ran Out of Food in the Last Year: Never true  Transportation Needs: No Transportation Needs (01/01/2023)   PRAPARE - Administrator, Civil Service (Medical): No    Lack of Transportation (Non-Medical): No  Physical Activity: Unknown (01/01/2023)   Exercise Vital Sign    Days of Exercise per Week: 0 days    Minutes of Exercise per Session: Not on file  Stress: Stress Concern Present (01/01/2023)   [employer] of Occupational Health - Occupational Stress Questionnaire    Feeling of Stress : To some extent  Social Connections: Moderately Integrated (01/01/2023)   Social Connection and Isolation Panel [NHANES]    Frequency of Communication with Friends and Family: More than three times a  week    Frequency of Social Gatherings with Friends and Family: More than three times a week    Attends Religious Services: 1 to 4 times per year    Active Member of [employer] or Organizations: No    Attends Engineer, structural: Not on file    Marital Status: Married    Family History  Problem Relation Age of Onset   Breast cancer Mother    Thyroid disease Mother    Stroke Mother    Hypertension Father    Hyperlipidemia Father    Heart attack Father    Heart failure Father    Breast cancer Paternal Grandmother    Cancer - Colon Neg Hx  Gastric cancer Neg Hx    Esophageal cancer Neg Hx    Liver cancer Neg Hx    Autoimmune disease Neg Hx     Outpatient Encounter Medications as of 09/15/2023  Medication Sig   acetaminophen (TYLENOL) 500 MG tablet Take 1 tablet (500 mg total) by mouth every 6 (six) hours as needed for moderate pain.   AIMOVIG 140 MG/ML SOAJ INJECT 140 MG UNDER THE SKIN EVERY 30 DAYS   albuterol (VENTOLIN HFA) 108 (90 Base) MCG/ACT inhaler USE 2 INHALATIONS EVERY 6 HOURS AS NEEDED FOR WHEEZING OR SHORTNESS OF BREATH   atorvastatin (LIPITOR) 40 MG tablet TAKE 1 TABLET AT BEDTIME   Blood Glucose Monitoring Suppl (ONE TOUCH ULTRA 2) w/Device KIT USE TO CHECK BLOOD GLUCOSE 4 TIMES A DAY   Cholecalciferol (VITAMIN D3) 125 MCG (5000 UT) capsule Take 5,000 Units by mouth daily.   diltiazem (CARDIZEM SR) 120 MG 12 hr capsule TAKE 2 CAPSULES DAILY   DULoxetine (CYMBALTA) 60 MG capsule Take 1 capsule (60 mg total) by mouth daily.   esomeprazole (NEXIUM) 40 MG capsule Take 1 capsule (40 mg total) by mouth 2 (two) times daily before a meal.   Estradiol 10 MCG TABS vaginal tablet Place 10 mcg vaginally every Monday.   famotidine (PEPCID) 40 MG tablet Take 1 tablet (40 mg total) by mouth daily.   Fluticasone-Salmeterol,sensor, (AIRDUO DIGIHALER) 232-14 MCG/ACT AEPB Inhale 1 Inhalation into the lungs 2 (two) times daily. USE 1 INHALATION TWICE A DAY Strength: 232-14  MCG/ACT   gabapentin (NEURONTIN) 300 MG capsule Take 1 capsule (300 mg total) by mouth at bedtime.   insulin glargine (LANTUS SOLOSTAR) 100 UNIT/ML Solostar Pen Inject 15 Units into the skin at bedtime.   Insulin Pen Needle (PEN NEEDLES) 31G X 8 MM MISC Use to inject insulin once daily   Lancets (ONETOUCH DELICA PLUS LANCET33G) MISC Use to check glucose once daily   meclizine (ANTIVERT) 25 MG tablet Take 1 tablet (25 mg total) by mouth 3 (three) times daily as needed for dizziness.   Multiple Vitamin (MULTIVITAMIN WITH MINERALS) TABS tablet Take 1 tablet by mouth daily.   ONETOUCH ULTRA test strip Use to monitor glucose once daily   OVER THE COUNTER MEDICATION Take 2 oz by mouth daily. Dose (herbal supplement), liver detox, taking for constipation   PROLIA 60 MG/ML SOSY injection INJECT 60 MG UNDER THE SKIN EVERY 6 MONTHS   promethazine (PHENERGAN) 12.5 MG tablet Take 1 tablet (12.5 mg total) by mouth every 6 (six) hours as needed for nausea or vomiting.   traMADol (ULTRAM) 50 MG tablet Take 1 tablet (50 mg total) by mouth every 12 (twelve) hours as needed.   triamcinolone cream (KENALOG) 0.1 % Apply 1 Application topically 2 (two) times daily.   venlafaxine (EFFEXOR) 37.5 MG tablet TAKE 1 TABLET TWICE A DAY   zolmitriptan (ZOMIG) 5 MG tablet Take 5 mg by mouth daily as needed for migraine.   Facility-Administered Encounter Medications as of 09/15/2023  Medication   bupivacaine-meloxicam ER (ZYNRELEF) injection 400 mg   bupivacaine-meloxicam ER (ZYNRELEF) injection 400 mg    ALLERGIES: Allergies  Allergen Reactions   Cefuroxime Anaphylaxis, Hives, Shortness Of Breath, Swelling and Anxiety   Codeine Hives, Shortness Of Breath, Nausea And Vomiting, Swelling and Anxiety   Morphine Anaphylaxis   Sulfa Antibiotics Anaphylaxis, Hives, Nausea And Vomiting and Swelling    Swollen throat   Sulfamethoxazole-Trimethoprim Shortness Of Breath, Swelling and Anxiety    Throat swelling    Gluten  Meal     Severe reflux - celiac disease    VACCINATION STATUS: Immunization History  Administered Date(s) Administered   Fluad Quad(high Dose 65+) 07/02/2021, 06/05/2022   Fluad Trivalent(High Dose 65+) 06/16/2023   Influenza Split 07/26/2014   Influenza, High Dose Seasonal PF 09/22/2018   Influenza-Unspecified 07/26/2014   PFIZER(Purple Top)SARS-COV-2 Vaccination 12/01/2019, 12/29/2019   Pneumococcal Conjugate-13 08/02/2019   Pneumococcal Polysaccharide-23 07/28/2020   Tdap 09/30/2016   Zoster Recombinant(Shingrix) 06/03/2018, 11/10/2019    Diabetes She presents for her follow-up diabetic visit. She has type 2 diabetes mellitus. Onset time: Diagnosed at approx age of 19. Her disease course has been worsening. There are no hypoglycemic associated symptoms. Associated symptoms include chest pain (ongoing work up with cardiology) and foot paresthesias. Pertinent negatives for diabetes include no polyuria and no weight loss. There are no hypoglycemic complications. Symptoms are stable. Diabetic complications include nephropathy and peripheral neuropathy. Risk factors for coronary artery disease include diabetes mellitus, dyslipidemia, family history, hypertension, obesity, post-menopausal and sedentary lifestyle. Current diabetic treatment includes insulin injections (stopped Ozempic between visits due to cost). She is compliant with treatment all of the time. Her weight is fluctuating minimally. She is following a generally healthy diet. When asked about meal planning, she reported none. She has not had a previous visit with a dietitian. She participates in exercise intermittently. Her home blood glucose trend is increasing steadily. Her overall blood glucose range is >200 mg/dl. (She presents today with her meter and logs showing above target glycemic profile overall.  Her POCT A1c today is 6.6%, increasing from last visit of 5.7%.  She was changed to basal insulin between visits due to high copay  for Ozempic.  Analysis of her meter shows 7-day average of 217, 14-day average of 217, 30-day average of 199. ) An ACE inhibitor/angiotensin II receptor blocker is being taken. She does not see a podiatrist.Eye exam is current (has eye exam later today).  Hyperlipidemia This is a chronic problem. The current episode started more than 1 year ago. The problem is uncontrolled. Recent lipid tests were reviewed and are high. Exacerbating diseases include chronic renal disease, diabetes and obesity. Factors aggravating her hyperlipidemia include fatty foods. Associated symptoms include chest pain (ongoing work up with cardiology). Current antihyperlipidemic treatment includes statins. The current treatment provides moderate improvement of lipids. There are no compliance problems.  Risk factors for coronary artery disease include diabetes mellitus, dyslipidemia, family history, obesity, hypertension, post-menopausal and a sedentary lifestyle.  Hypertension This is a chronic problem. The current episode started more than 1 year ago. The problem has been gradually improving since onset. The problem is controlled. Associated symptoms include chest pain (ongoing work up with cardiology). There are no associated agents to hypertension. Risk factors for coronary artery disease include diabetes mellitus, dyslipidemia, family history, obesity, post-menopausal state and sedentary lifestyle. Past treatments include ACE inhibitors. The current treatment provides moderate improvement. There are no compliance problems.  Hypertensive end-organ damage includes kidney disease. Identifiable causes of hypertension include chronic renal disease and sleep apnea.     Review of systems  Constitutional: + minimally fluctuating body weight, current Body mass index is 35.59 kg/m., no fatigue, no subjective hyperthermia, no subjective hypothermia Eyes: no blurry vision, no xerophthalmia ENT: no sore throat, no nodules palpated in  throat, no dysphagia/odynophagia, no hoarseness Cardiovascular: no chest pain, no shortness of breath, no palpitations, no leg swelling Respiratory: no cough, no shortness of breath Gastrointestinal: no nausea/vomiting/diarrhea Musculoskeletal: + diffuse muscle/joint aches Skin: no  rashes, no hyperemia Neurological: no tremors, + numbness/tingling to BLE, no dizziness Psychiatric: no depression, no anxiety  Objective:     BP 125/80 (BP Location: Right Arm, Patient Position: Sitting, Cuff Size: Large)   Pulse (!) 118   Ht 5\' 2"  (1.575 m)   Wt 194 lb 9.6 oz (88.3 kg)   BMI 35.59 kg/m   Wt Readings from Last 3 Encounters:  09/15/23 194 lb 9.6 oz (88.3 kg)  09/01/23 197 lb (89.4 kg)  07/09/23 186 lb 4.6 oz (84.5 kg)     BP Readings from Last 3 Encounters:  09/15/23 125/80  09/01/23 120/80  07/11/23 (!) 126/54     Physical Exam- Limited  Constitutional:  Body mass index is 35.59 kg/m. , not in acute distress, normal state of mind Eyes:  EOMI, no exophthalmos Musculoskeletal: no gross deformities, strength intact in all four extremities, no gross restriction of joint movements Skin:  no rashes, no hyperemia Neurological: no tremor with outstretched hands     Diabetic Foot Exam - Simple   No data filed     CMP ( most recent) CMP     Component Value Date/Time   NA 142 06/16/2023 1129   K 4.6 06/16/2023 1129   CL 106 06/16/2023 1129   CO2 22 06/16/2023 1129   GLUCOSE 102 (H) 06/16/2023 1129   GLUCOSE 181 (H) 12/07/2022 0557   BUN 14 06/16/2023 1129   CREATININE 0.87 06/16/2023 1129   CALCIUM 9.0 06/16/2023 1129   PROT 6.4 06/16/2023 1129   ALBUMIN 4.1 06/16/2023 1129   AST 19 06/16/2023 1129   ALT 22 06/16/2023 1129   ALKPHOS 94 06/16/2023 1129   BILITOT 0.3 06/16/2023 1129   GFRNONAA >60 12/07/2022 0557     Diabetic Labs (most recent): Lab Results  Component Value Date   HGBA1C 6.6 (A) 09/15/2023   HGBA1C 5.7 (H) 06/16/2023   HGBA1C 5.7 (A)  03/10/2023     Lipid Panel ( most recent) Lipid Panel     Component Value Date/Time   CHOL 202 (H) 06/16/2023 1129   TRIG 137 06/16/2023 1129   HDL 51 06/16/2023 1129   CHOLHDL 4.0 06/16/2023 1129   LDLCALC 127 (H) 06/16/2023 1129   LABVLDL 24 06/16/2023 1129      Lab Results  Component Value Date   TSH 1.520 06/16/2023   TSH 1.780 06/05/2022   TSH 2.830 10/02/2021   FREET4 1.08 10/02/2021           Assessment & Plan:   1) Type 2 diabetes mellitus with diabetic neuropathy, without long-term current use of insulin (HCC)  She presents today with her meter and logs showing above target glycemic profile overall.  Her POCT A1c today is 6.6%, increasing from last visit of 5.7%.  She was changed to basal insulin between visits due to high copay for Ozempic.  Analysis of her meter shows 7-day average of 217, 14-day average of 217, 30-day average of 199.   - Kasondra M Bousquet has currently uncontrolled symptomatic type 2 DM since 72 years of age.  -Recent labs reviewed.  - I had a long discussion with her about the progressive nature of diabetes and the pathology behind its complications. -her diabetes is complicated by mild CKD, peripheral neuropathy and she remains at a high risk for more acute and chronic complications which include CAD, CVA, CKD, retinopathy, and neuropathy. These are all discussed in detail with her.  - Nutritional counseling repeated at each appointment due to patients  tendency to fall back in to old habits.  - The patient admits there is a room for improvement in their diet and drink choices. -  Suggestion is made for the patient to avoid simple carbohydrates from their diet including Cakes, Sweet Desserts / Pastries, Ice Cream, Soda (diet and regular), Sweet Tea, Candies, Chips, Cookies, Sweet Pastries, Store Bought Juices, Alcohol in Excess of 1-2 drinks a day, Artificial Sweeteners, Coffee Creamer, and "Sugar-free" Products. This will help patient to have  stable blood glucose profile and potentially avoid unintended weight gain.   - I encouraged the patient to switch to unprocessed or minimally processed complex starch and increased protein intake (animal or plant source), fruits, and vegetables.   - Patient is advised to stick to a routine mealtimes to eat 3 meals a day and avoid unnecessary snacks (to snack only to correct hypoglycemia).  - I have approached her with the following individualized plan to manage her diabetes and patient agrees:   -She is advised to continue increase her Lantus to 25 units SQ nightly.  -she is advised to check glucose twice daily, before breakfast and before bed, and to call the clinic if she has readings less than 70 or above 300 for 3 tests in a row.  She is interested in CGM device.  I sent in for Dexcom G7 to [facility] for her.   - She does not tolerate Metformin due to GI upset (r/t her Celiac disease) and has allergy to sulfa meds.  - Specific targets for  A1c; LDL, HDL, and Triglycerides were discussed with the patient.  2) Blood Pressure /Hypertension:  her blood pressure is controlled to target.   she is advised to continue her current medications including Lisinopril 20 mg p.o. daily with breakfast.  3) Lipids/Hyperlipidemia:    Her most recent lipid panel from 06/16/23 shows uncontrolled LDL of 127 (improving).  Her PCP counseled her on this and advised she restart it which she has.  She is advised to continue Lipitor 40 mg po daily at bedtime.  Side effects and precautions discussed with her.    4)  Weight/Diet:  her Body mass index is 35.59 kg/m.  -  clearly complicating her diabetes care.   she is a candidate for weight loss. I discussed with her the fact that loss of 5 - 10% of her  current body weight will have the most impact on her diabetes management.  Exercise, and detailed carbohydrates information provided  -  detailed on discharge instructions.  5) Chronic Care/Health Maintenance: -she  is on ACEI/ARB and Statin medications and is encouraged to initiate and continue to follow up with Ophthalmology, Dentist, Podiatrist at least yearly or according to recommendations, and advised to stay away from smoking. I have recommended yearly flu vaccine and pneumonia vaccine at least every 5 years; moderate intensity exercise for up to 150 minutes weekly; and sleep for at least 7 hours a day.  - she is advised to maintain close follow up with Anabel Halon, MD for primary care needs, as well as her other providers for optimal and coordinated care.      I spent  45  minutes in the care of the patient today including review of labs from CMP, Lipids, Thyroid Function, Hematology (current and previous including abstractions from other facilities); face-to-face time discussing  her blood glucose readings/logs, discussing hypoglycemia and hyperglycemia episodes and symptoms, medications doses, her options of short and long term treatment based on the latest standards  of care / guidelines;  discussion about incorporating lifestyle medicine;  and documenting the encounter. Risk reduction counseling performed per USPSTF guidelines to reduce obesity and cardiovascular risk factors.     Please refer to Patient Instructions for Blood Glucose Monitoring and Insulin/Medications Dosing Guide"  in media tab for additional information. Please  also refer to " Patient Self Inventory" in the Media  tab for reviewed elements of pertinent patient history.  Kathyrn Sheriff participated in the discussions, expressed understanding, and voiced agreement with the above plans.  All questions were answered to her satisfaction. she is encouraged to contact clinic should she have any questions or concerns prior to her return visit.     Follow up plan: - Return in about 3 months (around 12/14/2023) for Diabetes F/U with A1c in office, No previsit labs, Bring meter and logs.   Ronny Bacon, [facility] [address] [place], [place] [address] Phone: [phone] Fax: [phone]   09/15/2023, 4:17 PM

## 2023-09-18 DIAGNOSIS — E1165 Type 2 diabetes mellitus with hyperglycemia: Secondary | ICD-10-CM | POA: Diagnosis not present

## 2023-09-19 ENCOUNTER — Encounter (HOSPITAL_COMMUNITY): Payer: Medicare HMO

## 2023-09-19 DIAGNOSIS — Z1231 Encounter for screening mammogram for malignant neoplasm of breast: Secondary | ICD-10-CM

## 2023-10-01 ENCOUNTER — Ambulatory Visit: Payer: Medicare HMO

## 2023-10-08 ENCOUNTER — Ambulatory Visit (HOSPITAL_COMMUNITY): Payer: Medicare HMO

## 2023-10-15 ENCOUNTER — Encounter: Payer: Self-pay | Admitting: Internal Medicine

## 2023-10-15 ENCOUNTER — Telehealth: Payer: Self-pay | Admitting: *Deleted

## 2023-10-15 MED ORDER — DILTIAZEM HCL ER 120 MG PO CP12: 240.0000 mg | ORAL_CAPSULE | Freq: Every day | ORAL | 3 refills | Status: DC

## 2023-10-15 NOTE — Telephone Encounter (Signed)
Refill complete

## 2023-10-16 ENCOUNTER — Other Ambulatory Visit (HOSPITAL_COMMUNITY): Payer: Self-pay | Admitting: Internal Medicine

## 2023-10-16 ENCOUNTER — Ambulatory Visit (INDEPENDENT_AMBULATORY_CARE_PROVIDER_SITE_OTHER): Payer: Medicare HMO | Admitting: Internal Medicine

## 2023-10-16 ENCOUNTER — Encounter: Payer: Self-pay | Admitting: Internal Medicine

## 2023-10-16 VITALS — BP 128/78 | HR 98 | Ht 62.0 in | Wt 194.8 lb

## 2023-10-16 DIAGNOSIS — J0100 Acute maxillary sinusitis, unspecified: Secondary | ICD-10-CM | POA: Diagnosis not present

## 2023-10-16 DIAGNOSIS — M7552 Bursitis of left shoulder: Secondary | ICD-10-CM | POA: Diagnosis not present

## 2023-10-16 DIAGNOSIS — E1140 Type 2 diabetes mellitus with diabetic neuropathy, unspecified: Secondary | ICD-10-CM

## 2023-10-16 DIAGNOSIS — M810 Age-related osteoporosis without current pathological fracture: Secondary | ICD-10-CM | POA: Diagnosis not present

## 2023-10-16 DIAGNOSIS — I671 Cerebral aneurysm, nonruptured: Secondary | ICD-10-CM

## 2023-10-16 DIAGNOSIS — I10 Essential (primary) hypertension: Secondary | ICD-10-CM | POA: Diagnosis not present

## 2023-10-16 DIAGNOSIS — Z1231 Encounter for screening mammogram for malignant neoplasm of breast: Secondary | ICD-10-CM

## 2023-10-16 DIAGNOSIS — Z794 Long term (current) use of insulin: Secondary | ICD-10-CM

## 2023-10-16 MED ORDER — AZITHROMYCIN 250 MG PO TABS: ORAL_TABLET | ORAL | 0 refills | Status: AC

## 2023-10-16 NOTE — Assessment & Plan Note (Signed)
Started empiric azithromycin as she has persistent symptoms despite symptomatic treatment Mucinex as needed for cough Flonase for nasal congestion/allergies

## 2023-10-16 NOTE — Assessment & Plan Note (Signed)
Used to get Prolia, followed by Rheumatology in [place] Referred to osteoporosis clinic for Prolia Has tried oral bisphosphonates, had GERD/esophagitis

## 2023-10-16 NOTE — Assessment & Plan Note (Addendum)
Lab Results  Component Value Date   HGBA1C 6.6 (A) 09/15/2023   Associated with HTN, GERD and depression Well controlled now On Lantus 25 U qHS, followed by Endocrinology On statin and ACEi Diabetic eye exam: Referred to Ophthalmology On Cymbalta for neuropathy On gabapentin 300 mg nightly for chronic pain

## 2023-10-16 NOTE — Patient Instructions (Addendum)
Please start taking Azithromycin as prescribed.  Please continue to take medications as prescribed.  Please continue to follow low carb diet and perform moderate exercise/walking at least 150 mins/week.  Please reschedule Annual Wellness Visit.

## 2023-10-16 NOTE — Assessment & Plan Note (Addendum)
BP Readings from Last 1 Encounters:  10/16/23 128/78   Well-controlled with Cardizem 240 mg QD Had to stop lisinopril due to hypotension in the past Counseled for compliance with the medications Advised DASH diet and moderate exercise/walking, at least 150 mins/week

## 2023-10-16 NOTE — Assessment & Plan Note (Signed)
S/p clip placement Although her headaches are likely related to migraine, would prefer to get follow-up MRA angiogram head

## 2023-10-16 NOTE — Progress Notes (Signed)
Established Patient Office Visit  Subjective:  Patient ID: Monique Warren, female    DOB: 12-23-1950  Age: 73 y.o. MRN: [id number]  CC:  Chief Complaint  Patient presents with   Care Management    4 month f/u, pt reports sx of sinus pressure and a bad cough.  Reports pain on her left shoulder.     HPI Monique Warren is a 73 y.o. female with past medical history of hypertension, cerebral artery aneurysm s/p clip, migraine, asthma, OSA, celiac disease, GERD, type II DM, osteoporosis, depression with anxiety and obesity who presents for f/u of her chronic medical conditions.  HTN: BP is wnl today. Takes diltiazem 240 mg QD regularly. Patient denies headache, chest pain, dyspnea or palpitations.  Her lisinopril has been stopped due to hypotensive episodes.  Type II DM: Her HbA1C was 6.6 in 12/24. Her Ozempic dose was discontinued by endocrinology due to cost concern.  She is currently taking Lantus 25 units daily.  Her blood glucose has been elevated, averages around 190 on CGM. She denies any polyuria or polydipsia.  MDD: She takes Cymbalta 60 mg daily.  She was placed on Effexor instead of Wellbutrin.  She has been tolerating it better.  She reports improvement in her apathy and concentration with it. Denies any SI or HI currently.  Migraine: Takes Aimovig and feels better now. Takes zolmitriptan as needed for breakthrough headache.  Cerebral artery aneurysm status post clip placement: She has not seen Neurosurgeon since moving to [place]. Her last MRI was around 5 years ago.  She complains of nasal congestion, postnasal drip and sinus pressure related headache for the last 1 week.  She has tried Robitussin without much relief.  Denies any fever, chills, dyspnea or wheezing currently.  She also complains of chronic left shoulder pain.  She has seen Dr. Romeo Apple for it. She has had subacromial steroid injection for bursitis, which did not provide relief for more than a week.  Past Medical  History:  Diagnosis Date   Anxiety    Arthritis    Asthma    Celiac disease    Depression    Diabetes mellitus, type II (HCC)    Diabetic neuropathy (HCC)    Diastolic dysfunction    Grade 1 with preserved EF   Dysrhythmia    GERD (gastroesophageal reflux disease)    Heart murmur    Hiatal hernia    Sliding   Hyperlipidemia    Hypertension    Migraine    Obstructive sleep apnea    Osteoporosis    Vertigo     Past Surgical History:  Procedure Laterality Date   ABDOMINAL HYSTERECTOMY  09/29/2003   APPENDECTOMY     BALLOON DILATION N/A 02/08/2022   Procedure: BALLOON DILATION;  Surgeon: Lanelle Bal, DO;  [facility];  Service: Endoscopy;  Laterality: N/A;   BIOPSY  02/08/2022   Procedure: BIOPSY;  Surgeon: Lanelle Bal, DO;  [facility];  Service: Endoscopy;;   BIOPSY  07/11/2023   Procedure: BIOPSY;  Surgeon: Lanelle Bal, DO;  [facility];  Service: Endoscopy;;   BREAST BIOPSY Left    CARPAL TUNNEL RELEASE     CATARACT EXTRACTION Bilateral    October and November 2016   COLONOSCOPY  09/2017   Blake Divine, MD in [place]; 5 mm tubular adenoma in the descending colon s/p resected, mild sigmoid diverticulosis, internal hemorrhoids.  Recommended repeat colonoscopy in 5 years.  COLONOSCOPY WITH PROPOFOL N/A 07/11/2023   Surgeon: Lanelle Bal, DO; Nonbleeding internal hemorrhoids, diverticulosis in the sigmoid colon, 5 mm polyp in the transverse colon resected and retrieved, 8 mm polyp in the sigmoid colon resected and retrieved, single nonbleeding angiodysplastic lesion.  Pathology with 1 tubular adenoma and 1 hyperplastic polyp.  Recommended 7-year surveillance.   CRANIOTOMY FOR ANEURYSM / VERTEBROBASILAR / CAROTID CIRCULATION Right 10/24/2015   ESOPHAGOGASTRODUODENOSCOPY  09/2017   [name]; irregular Z-line s/p biopsy, decreased LES tone, 4 cm hiatal hernia, erythema in gastric antrum biopsied, normal examined duodenum  biopsied.  Esophageal biopsy suggestive of reflux, no Barrett's, gastric biopsy with nonspecific chronic gastritis with intestinal metaplasia, duodenal biopsy with increased intraepithelial lymphocytes without blunting of villi, nonspecific.   ESOPHAGOGASTRODUODENOSCOPY (EGD) WITH PROPOFOL N/A 02/08/2022   Surgeon: Lanelle Bal, DO; 2 cm hiatal hernia, short segment Barrett's esophagus without dysplasia, mild Schatzki's ring dilated, gastritis with biopsies benign, normal examined duodenum.  Repeat EGD in 5 years.   ESOPHAGOGASTRODUODENOSCOPY (EGD) WITH PROPOFOL N/A 07/11/2023   Surgeon: Lanelle Bal, DO;  cm hiatal hernia, mucosal changes secondary to short segment Barrett's esophagus biopsied, esophageal mucosal changes suspicious for EOE biopsied and dilated, gastritis biopsied. Gastric biopsies benign, all esophageal biopsies benign.   KNEE ARTHROSCOPY WITH MEDIAL MENISECTOMY Left 04/09/2022   Procedure: KNEE ARTHROSCOPY WITH PARTIAL MEDIAL MENISCECTOMY, LATERAL MENISCAL DEBRIDEMENT;  Surgeon: Vickki Hearing, MD;  Location: [facility];  Service: Orthopedics;  Laterality: Left;   KNEE ARTHROSCOPY WITH MENISCAL REPAIR Left 04/09/2022   Procedure: KNEE ARTHROSCOPY WITH MEDIAL MENISCAL REPAIR;  Surgeon: Vickki Hearing, MD;  Location: [facility];  Service: Orthopedics;  Laterality: Left;   LAPAROSCOPY     POLYPECTOMY  07/11/2023   Procedure: POLYPECTOMY;  Surgeon: Lanelle Bal, DO;  [facility];  Service: Endoscopy;;   TOTAL KNEE ARTHROPLASTY Right 12/06/2022   Procedure: TOTAL KNEE ARTHROPLASTY;  Surgeon: Vickki Hearing, MD;  Location: [facility];  Service: Orthopedics;  Laterality: Right;   UTERINE FIBROID SURGERY     WRIST SURGERY Bilateral     Family History  Problem Relation Age of Onset   Breast cancer Mother    Thyroid disease Mother    Stroke Mother    Hypertension Father    Hyperlipidemia Father    Heart attack Father    Heart failure Father    Breast  cancer Paternal Grandmother    Cancer - Colon Neg Hx    Gastric cancer Neg Hx    Esophageal cancer Neg Hx    Liver cancer Neg Hx    Autoimmune disease Neg Hx     Social History   Socioeconomic History   Marital status: Married    Spouse name: Not on file   Number of children: Not on file   Years of education: Not on file   Highest education level: GED or equivalent  Occupational History   Not on file  Tobacco Use   Smoking status: Former    Current packs/day: 0.50    Types: Cigarettes   Smokeless tobacco: Never  Vaping Use   Vaping status: Never Used  Substance and Sexual Activity   Alcohol use: Yes    Alcohol/week: 4.0 standard drinks of alcohol    Types: 2 Glasses of wine, 2 Shots of liquor per week    Comment: rarely   Drug use: Never   Sexual activity: Not on file  Other Topics Concern   Not on file  Social History Narrative  Not on file   Social Drivers of Health   Financial Resource Strain: Low Risk  (10/15/2023)   Overall Financial Resource Strain (CARDIA)    Difficulty of Paying Living Expenses: Not very hard  Food Insecurity: No Food Insecurity (10/15/2023)   Hunger Vital Sign    Worried About Running Out of Food in the Last Year: Never true    Ran Out of Food in the Last Year: Never true  Transportation Needs: Unmet Transportation Needs (10/15/2023)   PRAPARE - Transportation    Lack of Transportation (Medical): Yes    Lack of Transportation (Non-Medical): Yes  Physical Activity: Insufficiently Active (10/15/2023)   Exercise Vital Sign    Days of Exercise per Week: 1 day    Minutes of Exercise per Session: 20 min  Stress: No Stress Concern Present (10/15/2023)   [employer] of Occupational Health - Occupational Stress Questionnaire    Feeling of Stress : Only a little  Social Connections: Moderately Integrated (10/15/2023)   Social Connection and Isolation Panel [NHANES]    Frequency of Communication with Friends and Family: More than three times  a week    Frequency of Social Gatherings with Friends and Family: More than three times a week    Attends Religious Services: 1 to 4 times per year    Active Member of [employer] or Organizations: No    Attends Banker Meetings: Not on file    Marital Status: Married  Catering manager Violence: Not At Risk (12/06/2022)   Humiliation, Afraid, Rape, and Kick questionnaire    Fear of Current or Ex-Partner: No    Emotionally Abused: No    Physically Abused: No    Sexually Abused: No    Outpatient Medications Prior to Visit  Medication Sig Dispense Refill   acetaminophen (TYLENOL) 500 MG tablet Take 1 tablet (500 mg total) by mouth every 6 (six) hours as needed for moderate pain. 30 tablet 0   AIMOVIG 140 MG/ML SOAJ INJECT 140 MG UNDER THE SKIN EVERY 30 DAYS 1 mL 11   albuterol (VENTOLIN HFA) 108 (90 Base) MCG/ACT inhaler USE 2 INHALATIONS EVERY 6 HOURS AS NEEDED FOR WHEEZING OR SHORTNESS OF BREATH 8.5 g 10   atorvastatin (LIPITOR) 40 MG tablet TAKE 1 TABLET AT BEDTIME 90 tablet 3   Blood Glucose Monitoring Suppl (ONE TOUCH ULTRA 2) w/Device KIT USE TO CHECK BLOOD GLUCOSE 4 TIMES A DAY 1 kit 0   Cholecalciferol (VITAMIN D3) 125 MCG (5000 UT) capsule Take 5,000 Units by mouth daily.     diltiazem (CARDIZEM SR) 120 MG 12 hr capsule Take 2 capsules (240 mg total) by mouth daily. 180 capsule 3   DULoxetine (CYMBALTA) 60 MG capsule Take 1 capsule (60 mg total) by mouth daily. 90 capsule 3   esomeprazole (NEXIUM) 40 MG capsule Take 1 capsule (40 mg total) by mouth 2 (two) times daily before a meal. 60 capsule 3   Estradiol 10 MCG TABS vaginal tablet Place 10 mcg vaginally every Monday.     famotidine (PEPCID) 40 MG tablet Take 1 tablet (40 mg total) by mouth daily. 90 tablet 3   Fluticasone-Salmeterol,sensor, (AIRDUO DIGIHALER) 232-14 MCG/ACT AEPB Inhale 1 Inhalation into the lungs 2 (two) times daily. USE 1 INHALATION TWICE A DAY Strength: 232-14 MCG/ACT 1 each 11   gabapentin (NEURONTIN)  300 MG capsule Take 1 capsule (300 mg total) by mouth at bedtime. 90 capsule 3   insulin glargine (LANTUS SOLOSTAR) 100 UNIT/ML Solostar Pen Inject 15 Units  into the skin at bedtime. 15 mL 3   Insulin Pen Needle (PEN NEEDLES) 31G X 8 MM MISC Use to inject insulin once daily 100 each 3   Lancets (ONETOUCH DELICA PLUS LANCET33G) MISC Use to check glucose once daily 100 each 6   meclizine (ANTIVERT) 25 MG tablet Take 1 tablet (25 mg total) by mouth 3 (three) times daily as needed for dizziness. 30 tablet 0   Multiple Vitamin (MULTIVITAMIN WITH MINERALS) TABS tablet Take 1 tablet by mouth daily.     ONETOUCH ULTRA test strip Use to monitor glucose once daily 100 each 6   OVER THE COUNTER MEDICATION Take 2 oz by mouth daily. Dose (herbal supplement), liver detox, taking for constipation     PROLIA 60 MG/ML SOSY injection INJECT 60 MG UNDER THE SKIN EVERY 6 MONTHS 1 mL 1   promethazine (PHENERGAN) 12.5 MG tablet Take 1 tablet (12.5 mg total) by mouth every 6 (six) hours as needed for nausea or vomiting. 30 tablet 0   traMADol (ULTRAM) 50 MG tablet Take 1 tablet (50 mg total) by mouth every 12 (twelve) hours as needed. 30 tablet 0   triamcinolone cream (KENALOG) 0.1 % Apply 1 Application topically 2 (two) times daily. 30 g 0   venlafaxine (EFFEXOR) 37.5 MG tablet TAKE 1 TABLET TWICE A DAY 60 tablet 3   zolmitriptan (ZOMIG) 5 MG tablet Take 5 mg by mouth daily as needed for migraine.     Facility-Administered Medications Prior to Visit  Medication Dose Route Frequency Provider Last Rate Last Admin   bupivacaine-meloxicam ER (ZYNRELEF) injection 400 mg  400 mg Infiltration Once Vickki Hearing, MD       bupivacaine-meloxicam ER (ZYNRELEF) injection 400 mg  400 mg Infiltration Once Vickki Hearing, MD        Allergies  Allergen Reactions   Cefuroxime Anaphylaxis, Hives, Shortness Of Breath, Swelling and Anxiety   Codeine Hives, Shortness Of Breath, Nausea And Vomiting, Swelling and Anxiety    Morphine Anaphylaxis   Sulfa Antibiotics Anaphylaxis, Hives, Nausea And Vomiting and Swelling    Swollen throat   Sulfamethoxazole-Trimethoprim Shortness Of Breath, Swelling and Anxiety    Throat swelling    Gluten Meal     Severe reflux - celiac disease    ROS Review of Systems  Constitutional:  Positive for fatigue. Negative for chills and fever.  HENT:  Positive for congestion, postnasal drip, sinus pressure and sinus pain. Negative for sore throat.   Eyes:  Positive for visual disturbance. Negative for pain and discharge.  Respiratory:  Positive for cough. Negative for shortness of breath.   Cardiovascular:  Negative for chest pain and palpitations.  Gastrointestinal:  Positive for constipation. Negative for abdominal pain, diarrhea, nausea and vomiting.  Endocrine: Negative for polydipsia and polyuria.  Genitourinary:  Negative for dysuria and hematuria.  Musculoskeletal:  Positive for arthralgias (Left knee, left shoulder, elbow and hand), back pain and neck pain. Negative for neck stiffness.  Skin:  Negative for rash.  Neurological:  Positive for dizziness and weakness (Right hand).  Psychiatric/Behavioral:  Positive for sleep disturbance. Negative for agitation and behavioral problems.       Objective:    Physical Exam Vitals reviewed.  Constitutional:      General: She is not in acute distress.    Appearance: She is obese. She is not diaphoretic.  HENT:     Head: Normocephalic and atraumatic.     Nose: Congestion present.     Right Sinus:  Maxillary sinus tenderness present.     Left Sinus: Maxillary sinus tenderness present.     Mouth/Throat:     Mouth: Mucous membranes are moist.  Eyes:     General: No scleral icterus.    Extraocular Movements: Extraocular movements intact.  Cardiovascular:     Rate and Rhythm: Normal rate and regular rhythm.     Pulses: Normal pulses.     Heart sounds: Murmur (Systolic over right upper sternal border) heard.  Pulmonary:      Breath sounds: Normal breath sounds. No wheezing or rales.  Musculoskeletal:     Left shoulder: Tenderness present. No swelling. Decreased range of motion.     Cervical back: Neck supple. No tenderness.     Right lower leg: No edema.     Left lower leg: No edema.     Comments: S/p right TKA, incision C/D/I  Skin:    General: Skin is warm.     Findings: No rash.  Neurological:     General: No focal deficit present.     Mental Status: She is alert and oriented to person, place, and time.     Sensory: No sensory deficit.     Motor: No weakness.  Psychiatric:        Mood and Affect: Mood normal.        Behavior: Behavior normal.     BP 128/78   Pulse 98   Ht 5\' 2"  (1.575 m)   Wt 194 lb 12.8 oz (88.4 kg)   SpO2 96%   BMI 35.63 kg/m  Wt Readings from Last 3 Encounters:  10/16/23 194 lb 12.8 oz (88.4 kg)  09/15/23 194 lb 9.6 oz (88.3 kg)  09/01/23 197 lb (89.4 kg)    Lab Results  Component Value Date   TSH 1.520 06/16/2023   Lab Results  Component Value Date   WBC 7.4 06/16/2023   HGB 14.8 06/16/2023   HCT 44.2 06/16/2023   MCV 94 06/16/2023   PLT 328 06/16/2023   Lab Results  Component Value Date   NA 142 06/16/2023   K 4.6 06/16/2023   CO2 22 06/16/2023   GLUCOSE 102 (H) 06/16/2023   BUN 14 06/16/2023   CREATININE 0.87 06/16/2023   BILITOT 0.3 06/16/2023   ALKPHOS 94 06/16/2023   AST 19 06/16/2023   ALT 22 06/16/2023   PROT 6.4 06/16/2023   ALBUMIN 4.1 06/16/2023   CALCIUM 9.0 06/16/2023   ANIONGAP 8 12/07/2022   EGFR 71 06/16/2023   Lab Results  Component Value Date   CHOL 202 (H) 06/16/2023   Lab Results  Component Value Date   HDL 51 06/16/2023   Lab Results  Component Value Date   LDLCALC 127 (H) 06/16/2023   Lab Results  Component Value Date   TRIG 137 06/16/2023   Lab Results  Component Value Date   CHOLHDL 4.0 06/16/2023   Lab Results  Component Value Date   HGBA1C 6.6 (A) 09/15/2023      Assessment & Plan:   Problem List  Items Addressed This Visit       Cardiovascular and Mediastinum   Aneurysm of middle cerebral artery   S/p clip placement Although her headaches are likely related to migraine, would prefer to get follow-up MRA angiogram head      Relevant Orders   MR Angiogram Head Wo Contrast   HTN (hypertension)   BP Readings from Last 1 Encounters:  10/16/23 128/78   Well-controlled with Cardizem 240 mg QD  Had to stop lisinopril due to hypotension in the past Counseled for compliance with the medications Advised DASH diet and moderate exercise/walking, at least 150 mins/week        Respiratory   Acute non-recurrent maxillary sinusitis   Started empiric azithromycin as she has persistent symptoms despite symptomatic treatment Mucinex as needed for cough Flonase for nasal congestion/allergies      Relevant Medications   azithromycin (ZITHROMAX) 250 MG tablet     Endocrine   Type 2 diabetes mellitus with diabetic neuropathy, unspecified (HCC) - Primary   Lab Results  Component Value Date   HGBA1C 6.6 (A) 09/15/2023   Associated with HTN, GERD and depression Well controlled now On Lantus 25 U qHS, followed by Endocrinology On statin and ACEi Diabetic eye exam: Referred to Ophthalmology On Cymbalta for neuropathy On gabapentin 300 mg nightly for chronic pain      Relevant Orders   Microalbumin / creatinine urine ratio     Musculoskeletal and Integument   Osteoporosis   Used to get Prolia, followed by Rheumatology in [place] Referred to osteoporosis clinic for Prolia Has tried oral bisphosphonates, had GERD/esophagitis      Relevant Orders   DG Bone Density   Amb Referral to Osteoporosis Management    Bursitis of left shoulder   Status steroid injection in acromial space Unable to take oral NSAIDs due to GERD/Barrett's esophagus Tramadol as needed for severe pain Advised to contact Orthopedic surgery       Meds ordered this encounter  Medications   azithromycin  (ZITHROMAX) 250 MG tablet    Sig: Take 2 tablets on day 1, then 1 tablet daily on days 2 through 5    Dispense:  6 tablet    Refill:  0    Follow-up: Return in about 4 months (around 02/13/2024) for HTN and DM.    Anabel Halon, MD

## 2023-10-16 NOTE — Assessment & Plan Note (Signed)
Status steroid injection in acromial space Unable to take oral NSAIDs due to GERD/Barrett's esophagus Tramadol as needed for severe pain Advised to contact Orthopedic surgery

## 2023-10-18 LAB — MICROALBUMIN / CREATININE URINE RATIO
Creatinine, Urine: 205.2 mg/dL
Microalb/Creat Ratio: 5 mg/g{creat} (ref 0–29)
Microalbumin, Urine: 9.5 ug/mL

## 2023-10-19 DIAGNOSIS — E1165 Type 2 diabetes mellitus with hyperglycemia: Secondary | ICD-10-CM | POA: Diagnosis not present

## 2023-10-19 NOTE — Progress Notes (Unsigned)
Referring Provider: Anabel Halon, MD Primary Care Physician:  Anabel Halon, MD Primary GI Physician: Dr. Marletta Lor  Chief Complaint  Patient presents with   Follow-up    Follow up. No problems     HPI:   Monique Warren is a 73 y.o. female with a history of of type 2 diabetes, HTN, HLD, OSA, grade 1 diastolic dysfunction with preserved EF, possible celiac disease with duodenal  biopsies non-specific in 2019, Barrett's esophagus, GERD, dysphagia, constipation, adenomatous colon polyps, fatty liver, elevated liver enzymes previously with normalization in September 2023, presenting today for follow-up of  GERD.   Last seen in the office 09/01/2023.  She was taking pantoprazole 40 mg twice daily, Pepcid at bedtime, still having heartburn primarily at night about 4 times a week.  Associated chest discomfort, nausea, vomiting 1-2 times a week.  Also reported some early satiety.  Some improvement with limiting ice cream in the evening, but still with symptoms.  Previously, Aciphex twice daily was not helpful.  Regarding dysphagia, reported that he was a rare, sometimes liquid, and preferred to monitor.  She was controlling her constipation with something called Dose which was an herbal supplement rather than Linzess.  Recommended changing pantoprazole to Nexium 40 mg twice daily, gastric emptying study, follow-up in 8 weeks.  We discussed esophageal manometry for dysphagia and pH impedance testing for GERD, but patient preferred to hold off on this.  Gastric emptying study was normal.   Today: Reports she is doing well on Nexium 40 mg twice daily and Pepcid 40 mg twice daily.  Nausea/vomiting resolved.  Typical GERD symptoms well-controlled.  She does still have some mild chest discomfort a couple times a week, but reports this is much improved overall.  When it occurs, it does tend to be in the evenings, after dinner, but no specific dietary triggers.  She is trying to be careful with her  diet.  She does not take anything when this occurs.  She tends to just go to bed.  Rare soda.  Coffee 4 days a week. Otherwise, has hot tea.  Fried foods on the weeked.  No spicy foods.  No NSAIDs aside from 81 mg aspirin.   No BRBPR or melena.    Last Colonoscopy 07/11/2023: Nonbleeding internal hemorrhoids, diverticulosis in the sigmoid colon, 5 mm polyp in the transverse colon resected and retrieved, 8 mm polyp in the sigmoid colon resected and retrieved, single nonbleeding angiodysplastic lesion.  Pathology with 1 tubular adenoma and 1 hyperplastic polyp.  Recommended 7-year surveillance.   Last EGD 07/11/2023: 2 cm hiatal hernia, mucosal changes secondary to short segment Barrett's esophagus biopsied, esophageal mucosal changes suspicious for EOE biopsied and dilated, gastritis biopsied.  Recommended PPI twice daily.  Gastric biopsies benign, all esophageal biopsies benign.    Past Medical History:  Diagnosis Date   Anxiety    Arthritis    Asthma    Celiac disease    Depression    Diabetes mellitus, type II (HCC)    Diabetic neuropathy (HCC)    Diastolic dysfunction    Grade 1 with preserved EF   Dysrhythmia    GERD (gastroesophageal reflux disease)    Heart murmur    Hiatal hernia    Sliding   Hyperlipidemia    Hypertension    Migraine    Obstructive sleep apnea    Osteoporosis    Vertigo     Past Surgical History:  Procedure Laterality Date   ABDOMINAL HYSTERECTOMY  09/29/2003   APPENDECTOMY     BALLOON DILATION N/A 02/08/2022   Procedure: BALLOON DILATION;  Surgeon: Lanelle Bal, DO;  [facility]: [facility];  Service: Endoscopy;  Laterality: N/A;   BIOPSY  02/08/2022   Procedure: BIOPSY;  Surgeon: Lanelle Bal, DO;  [facility];  Service: Endoscopy;;   BIOPSY  07/11/2023   Procedure: BIOPSY;  Surgeon: Lanelle Bal, DO;  [facility]: [facility];  Service: Endoscopy;;   BREAST BIOPSY Left    CARPAL TUNNEL RELEASE     CATARACT  EXTRACTION Bilateral    October and November 2016   COLONOSCOPY  09/2017   Blake Divine, MD in [place]; 5 mm tubular adenoma in the descending colon s/p resected, mild sigmoid diverticulosis, internal hemorrhoids.  Recommended repeat colonoscopy in 5 years.   COLONOSCOPY WITH PROPOFOL N/A 07/11/2023   Surgeon: Lanelle Bal, DO; Nonbleeding internal hemorrhoids, diverticulosis in the sigmoid colon, 5 mm polyp in the transverse colon resected and retrieved, 8 mm polyp in the sigmoid colon resected and retrieved, single nonbleeding angiodysplastic lesion.  Pathology with 1 tubular adenoma and 1 hyperplastic polyp.  Recommended 7-year surveillance.   CRANIOTOMY FOR ANEURYSM / VERTEBROBASILAR / CAROTID CIRCULATION Right 10/24/2015   ESOPHAGOGASTRODUODENOSCOPY  09/2017   [name]; irregular Z-line s/p biopsy, decreased LES tone, 4 cm hiatal hernia, erythema in gastric antrum biopsied, normal examined duodenum biopsied.  Esophageal biopsy suggestive of reflux, no Barrett's, gastric biopsy with nonspecific chronic gastritis with intestinal metaplasia, duodenal biopsy with increased intraepithelial lymphocytes without blunting of villi, nonspecific.   ESOPHAGOGASTRODUODENOSCOPY (EGD) WITH PROPOFOL N/A 02/08/2022   Surgeon: Lanelle Bal, DO; 2 cm hiatal hernia, short segment Barrett's esophagus without dysplasia, mild Schatzki's ring dilated, gastritis with biopsies benign, normal examined duodenum.  Repeat EGD in 5 years.   ESOPHAGOGASTRODUODENOSCOPY (EGD) WITH PROPOFOL N/A 07/11/2023   Surgeon: Lanelle Bal, DO;  cm hiatal hernia, mucosal changes secondary to short segment Barrett's esophagus biopsied, esophageal mucosal changes suspicious for EOE biopsied and dilated, gastritis biopsied. Gastric biopsies benign, all esophageal biopsies benign.   KNEE ARTHROSCOPY WITH MEDIAL MENISECTOMY Left 04/09/2022   Procedure: KNEE ARTHROSCOPY WITH PARTIAL MEDIAL MENISCECTOMY, LATERAL MENISCAL DEBRIDEMENT;   Surgeon: Vickki Hearing, MD;  Location: [facility];  Service: Orthopedics;  Laterality: Left;   KNEE ARTHROSCOPY WITH MENISCAL REPAIR Left 04/09/2022   Procedure: KNEE ARTHROSCOPY WITH MEDIAL MENISCAL REPAIR;  Surgeon: Vickki Hearing, MD;  Location: [facility];  Service: Orthopedics;  Laterality: Left;   LAPAROSCOPY     POLYPECTOMY  07/11/2023   Procedure: POLYPECTOMY;  Surgeon: Lanelle Bal, DO;  [facility];  Service: Endoscopy;;   TOTAL KNEE ARTHROPLASTY Right 12/06/2022   Procedure: TOTAL KNEE ARTHROPLASTY;  Surgeon: Vickki Hearing, MD;  Location: [facility];  Service: Orthopedics;  Laterality: Right;   UTERINE FIBROID SURGERY     WRIST SURGERY Bilateral     Current Outpatient Medications  Medication Sig Dispense Refill   acetaminophen (TYLENOL) 500 MG tablet Take 1 tablet (500 mg total) by mouth every 6 (six) hours as needed for moderate pain. 30 tablet 0   AIMOVIG 140 MG/ML SOAJ INJECT 140 MG UNDER THE SKIN EVERY 30 DAYS 1 mL 11   albuterol (VENTOLIN HFA) 108 (90 Base) MCG/ACT inhaler USE 2 INHALATIONS EVERY 6 HOURS AS NEEDED FOR WHEEZING OR SHORTNESS OF BREATH 8.5 g 10   atorvastatin (LIPITOR) 40 MG tablet TAKE 1 TABLET AT BEDTIME 90 tablet 3   azithromycin (  ZITHROMAX) 250 MG tablet Take 2 tablets on day 1, then 1 tablet daily on days 2 through 5 6 tablet 0   Blood Glucose Monitoring Suppl (ONE TOUCH ULTRA 2) w/Device KIT USE TO CHECK BLOOD GLUCOSE 4 TIMES A DAY 1 kit 0   Cholecalciferol (VITAMIN D3) 125 MCG (5000 UT) capsule Take 5,000 Units by mouth daily.     diltiazem (CARDIZEM SR) 120 MG 12 hr capsule Take 2 capsules (240 mg total) by mouth daily. 180 capsule 3   DULoxetine (CYMBALTA) 60 MG capsule Take 1 capsule (60 mg total) by mouth daily. 90 capsule 3   Estradiol 10 MCG TABS vaginal tablet Place 10 mcg vaginally every Monday.     Fluticasone-Salmeterol,sensor, (AIRDUO DIGIHALER) 232-14 MCG/ACT AEPB Inhale 1 Inhalation into the lungs 2 (two) times daily. USE  1 INHALATION TWICE A DAY Strength: 232-14 MCG/ACT 1 each 11   gabapentin (NEURONTIN) 300 MG capsule Take 1 capsule (300 mg total) by mouth at bedtime. 90 capsule 3   insulin glargine (LANTUS SOLOSTAR) 100 UNIT/ML Solostar Pen Inject 15 Units into the skin at bedtime. 15 mL 3   Insulin Pen Needle (PEN NEEDLES) 31G X 8 MM MISC Use to inject insulin once daily 100 each 3   Lancets (ONETOUCH DELICA PLUS LANCET33G) MISC Use to check glucose once daily 100 each 6   meclizine (ANTIVERT) 25 MG tablet Take 1 tablet (25 mg total) by mouth 3 (three) times daily as needed for dizziness. 30 tablet 0   Multiple Vitamin (MULTIVITAMIN WITH MINERALS) TABS tablet Take 1 tablet by mouth daily.     ONETOUCH ULTRA test strip Use to monitor glucose once daily 100 each 6   OVER THE COUNTER MEDICATION Take 2 oz by mouth daily. Dose (herbal supplement), liver detox, taking for constipation     PROLIA 60 MG/ML SOSY injection INJECT 60 MG UNDER THE SKIN EVERY 6 MONTHS 1 mL 1   promethazine (PHENERGAN) 12.5 MG tablet Take 1 tablet (12.5 mg total) by mouth every 6 (six) hours as needed for nausea or vomiting. 30 tablet 0   traMADol (ULTRAM) 50 MG tablet Take 1 tablet (50 mg total) by mouth every 12 (twelve) hours as needed. 30 tablet 0   triamcinolone cream (KENALOG) 0.1 % Apply 1 Application topically 2 (two) times daily. 30 g 0   venlafaxine (EFFEXOR) 37.5 MG tablet TAKE 1 TABLET TWICE A DAY 60 tablet 3   zolmitriptan (ZOMIG) 5 MG tablet Take 5 mg by mouth daily as needed for migraine.     esomeprazole (NEXIUM) 40 MG capsule Take 1 capsule (40 mg total) by mouth 2 (two) times daily before a meal. 180 capsule 3   famotidine (PEPCID) 40 MG tablet Take 1 tablet (40 mg total) by mouth 2 (two) times daily. 180 tablet 3   Current Facility-Administered Medications  Medication Dose Route Frequency Provider Last Rate Last Admin   bupivacaine-meloxicam ER (ZYNRELEF) injection 400 mg  400 mg Infiltration Once Vickki Hearing, MD        bupivacaine-meloxicam ER (ZYNRELEF) injection 400 mg  400 mg Infiltration Once Vickki Hearing, MD        Allergies as of 10/20/2023 - Review Complete 10/20/2023  Allergen Reaction Noted   Cefuroxime Anaphylaxis, Hives, Shortness Of Breath, Swelling, and Anxiety 09/16/2006   Codeine Hives, Shortness Of Breath, Nausea And Vomiting, Swelling, and Anxiety 09/16/2006   Morphine Anaphylaxis 07/09/2023   Sulfa antibiotics Anaphylaxis, Hives, Nausea And Vomiting, and Swelling 09/02/2017  Sulfamethoxazole-trimethoprim Shortness Of Breath, Swelling, and Anxiety 09/16/2006   Gluten meal  02/05/2022    Family History  Problem Relation Age of Onset   Breast cancer Mother    Thyroid disease Mother    Stroke Mother    Hypertension Father    Hyperlipidemia Father    Heart attack Father    Heart failure Father    Breast cancer Paternal Grandmother    Cancer - Colon Neg Hx    Gastric cancer Neg Hx    Esophageal cancer Neg Hx    Liver cancer Neg Hx    Autoimmune disease Neg Hx     Social History   Socioeconomic History   Marital status: Married    Spouse name: Not on file   Number of children: Not on file   Years of education: Not on file   Highest education level: GED or equivalent  Occupational History   Not on file  Tobacco Use   Smoking status: Former    Current packs/day: 0.50    Types: Cigarettes   Smokeless tobacco: Never  Vaping Use   Vaping status: Never Used  Substance and Sexual Activity   Alcohol use: Yes    Alcohol/week: 4.0 standard drinks of alcohol    Types: 2 Glasses of wine, 2 Shots of liquor per week    Comment: rarely   Drug use: Never   Sexual activity: Not on file  Other Topics Concern   Not on file  Social History Narrative   Not on file   Social Drivers of Health   Financial Resource Strain: Low Risk  (10/15/2023)   Overall Financial Resource Strain (CARDIA)    Difficulty of Paying Living Expenses: Not very hard  Food Insecurity: No  Food Insecurity (10/15/2023)   Hunger Vital Sign    Worried About Running Out of Food in the Last Year: Never true    Ran Out of Food in the Last Year: Never true  Transportation Needs: Unmet Transportation Needs (10/15/2023)   PRAPARE - Transportation    Lack of Transportation (Medical): Yes    Lack of Transportation (Non-Medical): Yes  Physical Activity: Insufficiently Active (10/15/2023)   Exercise Vital Sign    Days of Exercise per Week: 1 day    Minutes of Exercise per Session: 20 min  Stress: No Stress Concern Present (10/15/2023)   [employer] of Occupational Health - Occupational Stress Questionnaire    Feeling of Stress : Only a little  Social Connections: Moderately Integrated (10/15/2023)   Social Connection and Isolation Panel [NHANES]    Frequency of Communication with Friends and Family: More than three times a week    Frequency of Social Gatherings with Friends and Family: More than three times a week    Attends Religious Services: 1 to 4 times per year    Active Member of [employer] or Organizations: No    Attends Engineer, structural: Not on file    Marital Status: Married    Review of Systems: Gen: Denies fever, chills, presyncope, syncope. CV: Denies chest pain, palpitations. Resp: Denies dyspnea.  Admits to cough due to recent upper respiratory illness. GI: See HPI Heme: See HPI  Physical Exam: BP 138/76 (BP Location: Right Arm, Patient Position: Sitting, Cuff Size: Large)   Pulse 96   Temp (!) 97.5 F (36.4 C) (Temporal)   Ht 5\' 2"  (1.575 m)   Wt 198 lb 9.6 oz (90.1 kg)   BMI 36.32 kg/m  General:   Alert and  oriented. No distress noted. Pleasant and cooperative.  Head:  Normocephalic and atraumatic. Eyes:  Conjuctiva clear without scleral icterus. Abdomen:  +BS, soft, non-tender and non-distended. No rebound or guarding. No HSM or masses noted. Msk:  Symmetrical without gross deformities. Normal posture. Psych:  Normal mood and  affect.    Assessment:  73 y.o. female with a history of of type 2 diabetes, HTN, HLD, OSA, grade 1 diastolic dysfunction with preserved EF, possible celiac disease with duodenal  biopsies non-specific in 2019, Barrett's esophagus, GERD, dysphagia, constipation, adenomatous colon polyps, fatty liver, elevated liver enzymes previously with normalization in September 2023, presenting today for follow-up of  GERD.   Symptoms much improved after changing pantoprazole 40 mg twice daily to Nexium 40 mg twice daily.  Previously failed Aciphex.  She does still have intermittent mild chest discomfort after meals 1 or 2 times a week.  This may very well be related to breakthrough GERD.  Considering she has had significant improvement in her other symptoms and this is the best that she has felt in quite some time, I do not want a change PPI again at this time.  I recommended that she keep a dietary log to see if we can further tease out any specific triggers for her.  Also reinforced general GERD diet/lifestyle.  Specifically encouraged avoiding caffeine/carbonated beverages.   Plan:  Continue Nexium 40 mg twice daily. Continue Pepcid 40 mg twice daily. Keep dietary log of when chest pain occurs and foods eaten prior that day. Follow a GERD diet:  Avoid fried, fatty, greasy, spicy, citrus foods. Avoid caffeine and carbonated beverages. Avoid chocolate. Try eating 4-6 small meals a day rather than 3 large meals. Do not eat within 3 hours of laying down. Prop head of bed up on wood or bricks to create a 6 inch incline. Follow-up in 3 months or sooner if needed.   Ermalinda Memos, PA-C [facility] 10/20/2023

## 2023-10-20 ENCOUNTER — Ambulatory Visit: Payer: Medicare HMO | Admitting: Gastroenterology

## 2023-10-20 ENCOUNTER — Encounter: Payer: Self-pay | Admitting: Gastroenterology

## 2023-10-20 DIAGNOSIS — K219 Gastro-esophageal reflux disease without esophagitis: Secondary | ICD-10-CM | POA: Diagnosis not present

## 2023-10-20 MED ORDER — FAMOTIDINE 40 MG PO TABS: 40.0000 mg | ORAL_TABLET | Freq: Two times a day (BID) | ORAL | 3 refills | Status: DC

## 2023-10-20 MED ORDER — ESOMEPRAZOLE MAGNESIUM 40 MG PO CPDR
40.0000 mg | DELAYED_RELEASE_CAPSULE | Freq: Two times a day (BID) | ORAL | 3 refills | Status: DC
Start: 2023-10-20 — End: 2024-03-08

## 2023-10-20 NOTE — Patient Instructions (Addendum)
Continue Nexium 40 mg twice daily 30 minutes before breakfast and dinner.  Continue famotidine 40 mg twice a day.   Keep a log of when your chest pain occurs and what you have eaten prior to this during the day to see if we can identify any specific triggers.  Follow a GERD diet/lifestyle:  Avoid fried, fatty, greasy, spicy, citrus foods. Avoid caffeine and carbonated beverages. Avoid chocolate. Try eating 4-6 small meals a day rather than 3 large meals. Do not eat within 3 hours of laying down. Prop head of bed up on wood or bricks to create a 6 inch incline.  I will plan to see you back in 3 months or sooner if needed.  Ermalinda Memos, PA-C [facility]

## 2023-10-22 ENCOUNTER — Encounter (INDEPENDENT_AMBULATORY_CARE_PROVIDER_SITE_OTHER): Payer: Self-pay

## 2023-10-23 ENCOUNTER — Encounter (HOSPITAL_COMMUNITY): Payer: Self-pay

## 2023-10-23 ENCOUNTER — Ambulatory Visit (HOSPITAL_COMMUNITY)
Admission: RE | Admit: 2023-10-23 | Discharge: 2023-10-23 | Disposition: A | Discharge status: Home / Self Care | Payer: Medicare HMO | Source: Ambulatory Visit | Attending: Internal Medicine | Admitting: Internal Medicine

## 2023-10-23 ENCOUNTER — Inpatient Hospital Stay (HOSPITAL_COMMUNITY): Admission: RE | Admit: 2023-10-23 | Payer: Medicare HMO | Source: Ambulatory Visit

## 2023-10-23 DIAGNOSIS — Z780 Asymptomatic menopausal state: Secondary | ICD-10-CM | POA: Diagnosis not present

## 2023-10-23 DIAGNOSIS — M810 Age-related osteoporosis without current pathological fracture: Secondary | ICD-10-CM | POA: Insufficient documentation

## 2023-10-23 DIAGNOSIS — Z1231 Encounter for screening mammogram for malignant neoplasm of breast: Secondary | ICD-10-CM | POA: Insufficient documentation

## 2023-10-23 DIAGNOSIS — M85852 Other specified disorders of bone density and structure, left thigh: Secondary | ICD-10-CM | POA: Diagnosis not present

## 2023-10-27 ENCOUNTER — Ambulatory Visit: Payer: Medicare HMO | Admitting: Gastroenterology

## 2023-10-28 ENCOUNTER — Ambulatory Visit (HOSPITAL_COMMUNITY)
Admission: RE | Admit: 2023-10-28 | Discharge: 2023-10-28 | Disposition: A | Discharge status: Home / Self Care | Payer: Medicare HMO | Source: Ambulatory Visit | Attending: Internal Medicine | Admitting: Internal Medicine

## 2023-10-28 DIAGNOSIS — I671 Cerebral aneurysm, nonruptured: Secondary | ICD-10-CM | POA: Insufficient documentation

## 2023-11-03 ENCOUNTER — Encounter: Payer: Self-pay | Admitting: Physician Assistant

## 2023-11-03 ENCOUNTER — Telehealth: Payer: Self-pay

## 2023-11-03 ENCOUNTER — Ambulatory Visit (INDEPENDENT_AMBULATORY_CARE_PROVIDER_SITE_OTHER): Payer: Medicare HMO | Admitting: Physician Assistant

## 2023-11-03 DIAGNOSIS — M2550 Pain in unspecified joint: Secondary | ICD-10-CM | POA: Diagnosis not present

## 2023-11-03 NOTE — Progress Notes (Signed)
Office Visit Note   Patient: Monique Warren           Date of Birth: Aug 25, 1951           MRN: [id number] Visit Date: 11/03/2023              Requested by: Anabel Halon, MD [address] [place],  [place] [address] PCP: Anabel Halon, MD   Assessment & Plan: Visit Diagnoses:  1. Polyarthralgia   Osteoporosis   Plan: Arline Asp is a pleasant 73 year old woman who was referred by Dr. Allena Katz.  She has a history of osteoporosis defined by osteopenia as well as fractures of both of her wrist.  She was getting Prolia shots for the last 5 years.  She moved here about a year and a half ago and has been unable to get them.  She does say she had improvement or stabilization of her bone density when she was on the Prolia.  She has had recent lab work both her vitamin D and calcium are adequate.  She has no history of heart attack heart disease or stroke no history of cancer kidney disease or ulcer.  She does have severe reflux.  No history of epilepsy.  She went through menopause when she was about [age].  She takes 5000 units of vitamin D daily never did hormone replacement she is a former smoker she drinks 1 alcoholic beverage per week.  She does not currently do any exercise and has not had any major dental work in the past year.  No familial history of osteoporosis.  She does state however that she is limited to exercise because all of her joints hurt.  She was given her Prolia in the past by rheumatologist but this is unclear if she had had any labs to rule out an inflammatory arthropathy.  I do think we should draw inflammatory labs today if these are positive would refer her to rheumatology.  She understands that she has lost the effectiveness of the Prolia but certainly would advocate for starting this again.  Will put in for authorization  Follow-Up Instructions: For Prolia injections nurses visit after approved  Orders:  Orders Placed This Encounter  Procedures   Sed Rate (ESR)   Uric acid    Antinuclear Antib (ANA)   Rheumatoid Factor   No orders of the defined types were placed in this encounter.     Procedures: No procedures performed   Clinical Data: No additional findings.   Subjective: Chief Complaint  Patient presents with   Osteoporosis    HPI patient is a pleasant 73 year old woman who presents in referral today for evaluation and treatment of osteoporosis.  Has been on Prolia when she lived on [place]  but for the last year and a half has not had any injections.  Does have a history of bilateral wrist fractures and history of bone density testing in the osteopenia range.  Combined with the fractures meets the definition of osteoporosis  Review of Systems  All other systems reviewed and are negative.    Objective: Vital Signs: There were no vitals taken for this visit.  Physical Exam Constitutional:      Appearance: Normal appearance.  Pulmonary:     Effort: Pulmonary effort is normal.  Neurological:     General: No focal deficit present.     Mental Status: She is alert and oriented to person, place, and time.  Psychiatric:        Mood  and Affect: Mood normal.        Behavior: Behavior normal.     Ortho Exam  Specialty Comments:  No specialty comments available.  Imaging: No results found.   PMFS History: Patient Active Problem List   Diagnosis Date Noted   Polyarthralgia 11/03/2023   Acute non-recurrent maxillary sinusitis 10/16/2023   PVD (peripheral vascular disease) (HCC) 07/25/2023   Bursitis of left shoulder 06/16/2023   Allergic dermatitis 06/16/2023   Blurry vision, bilateral 06/16/2023   COVID-19 05/02/2023   Primary osteoarthritis of right knee 12/06/2022   Status post total right knee replacement 12/06/2022   Chronic fatigue 10/02/2022   Acute pain of right shoulder 07/05/2022   S/P left knee arthroscopy 04/09/22 04/15/2022   Acute medial meniscus tear of left knee    Tear of lateral meniscus of left knee     Encounter for general adult medical examination with abnormal findings 04/01/2022   Dysphagia 01/16/2022   Gastroesophageal reflux disease 01/16/2022   Barrett's esophagus without dysplasia 01/16/2022   Chronic idiopathic constipation 01/16/2022   Fatty liver 01/16/2022   History of colonic polyps 01/16/2022   Constipation 10/02/2021   Bilateral thumb pain 08/14/2021   Chest pain 07/02/2021   Hernia of abdominal cavity 06/12/2021   Vertigo 06/12/2021   Carpal tunnel syndrome of right wrist 05/16/2021   Polyneuropathy due to type 2 diabetes mellitus (HCC) 05/16/2021   MDD (major depressive disorder), recurrent episode (HCC) 04/19/2021   HTN (hypertension) 04/19/2021   BPPV (benign paroxysmal positional vertigo) 09/25/2019   Celiac disease 09/25/2019   Obstructive sleep apnea 09/25/2019   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   Hyperlipidemia 10/16/2015   Asthma 09/27/2015   Migraine without aura or status migrainosus 09/27/2015   Other intervertebral disc degeneration, lumbosacral region 01/26/2014   Age-related osteoporosis without current pathological fracture 09/16/2008   Type 2 diabetes mellitus with diabetic neuropathy, unspecified (HCC) 09/16/2008   Sliding hiatal hernia 10/30/51   Past Medical History:  Diagnosis Date   Anxiety    Arthritis    Asthma    Celiac disease    Depression    Diabetes mellitus, type II (HCC)    Diabetic neuropathy (HCC)    Diastolic dysfunction    Grade 1 with preserved EF   Dysrhythmia    GERD (gastroesophageal reflux disease)    Heart murmur    Hiatal hernia    Sliding   Hyperlipidemia    Hypertension    Migraine    Obstructive sleep apnea    Osteoporosis    Vertigo     Family History  Problem Relation Age of Onset   Breast cancer Mother    Thyroid disease Mother    Stroke Mother    Hypertension Father    Hyperlipidemia Father    Heart attack Father    Heart failure Father    Breast cancer Paternal Grandmother     Cancer - Colon Neg Hx    Gastric cancer Neg Hx    Esophageal cancer Neg Hx    Liver cancer Neg Hx    Autoimmune disease Neg Hx     Past Surgical History:  Procedure Laterality Date   ABDOMINAL HYSTERECTOMY  09/29/2003   APPENDECTOMY     BALLOON DILATION N/A 02/08/2022   Procedure: BALLOON DILATION;  Surgeon: Lanelle Bal, DO;  [facility];  Service: Endoscopy;  Laterality: N/A;   BIOPSY  02/08/2022   Procedure: BIOPSY;  Surgeon: Lanelle Bal, DO;  [facility];  Service: Endoscopy;;   BIOPSY  07/11/2023   Procedure: BIOPSY;  Surgeon: Lanelle Bal, DO;  [facility];  Service: Endoscopy;;   BREAST BIOPSY Left    CARPAL TUNNEL RELEASE     CATARACT EXTRACTION Bilateral    October and November 2016   COLONOSCOPY  09/2017   Blake Divine, MD in [place]; 5 mm tubular adenoma in the descending colon s/p resected, mild sigmoid diverticulosis, internal hemorrhoids.  Recommended repeat colonoscopy in 5 years.   COLONOSCOPY WITH PROPOFOL N/A 07/11/2023   Surgeon: Lanelle Bal, DO; Nonbleeding internal hemorrhoids, diverticulosis in the sigmoid colon, 5 mm polyp in the transverse colon resected and retrieved, 8 mm polyp in the sigmoid colon resected and retrieved, single nonbleeding angiodysplastic lesion.  Pathology with 1 tubular adenoma and 1 hyperplastic polyp.  Recommended 7-year surveillance.   CRANIOTOMY FOR ANEURYSM / VERTEBROBASILAR / CAROTID CIRCULATION Right 10/24/2015   ESOPHAGOGASTRODUODENOSCOPY  09/2017   [name]; irregular Z-line s/p biopsy, decreased LES tone, 4 cm hiatal hernia, erythema in gastric antrum biopsied, normal examined duodenum biopsied.  Esophageal biopsy suggestive of reflux, no Barrett's, gastric biopsy with nonspecific chronic gastritis with intestinal metaplasia, duodenal biopsy with increased intraepithelial lymphocytes without blunting of villi, nonspecific.   ESOPHAGOGASTRODUODENOSCOPY (EGD) WITH PROPOFOL N/A  02/08/2022   Surgeon: Lanelle Bal, DO; 2 cm hiatal hernia, short segment Barrett's esophagus without dysplasia, mild Schatzki's ring dilated, gastritis with biopsies benign, normal examined duodenum.  Repeat EGD in 5 years.   ESOPHAGOGASTRODUODENOSCOPY (EGD) WITH PROPOFOL N/A 07/11/2023   Surgeon: Lanelle Bal, DO;  cm hiatal hernia, mucosal changes secondary to short segment Barrett's esophagus biopsied, esophageal mucosal changes suspicious for EOE biopsied and dilated, gastritis biopsied. Gastric biopsies benign, all esophageal biopsies benign.   KNEE ARTHROSCOPY WITH MEDIAL MENISECTOMY Left 04/09/2022   Procedure: KNEE ARTHROSCOPY WITH PARTIAL MEDIAL MENISCECTOMY, LATERAL MENISCAL DEBRIDEMENT;  Surgeon: Vickki Hearing, MD;  Location: [facility];  Service: Orthopedics;  Laterality: Left;   KNEE ARTHROSCOPY WITH MENISCAL REPAIR Left 04/09/2022   Procedure: KNEE ARTHROSCOPY WITH MEDIAL MENISCAL REPAIR;  Surgeon: Vickki Hearing, MD;  Location: [facility];  Service: Orthopedics;  Laterality: Left;   LAPAROSCOPY     POLYPECTOMY  07/11/2023   Procedure: POLYPECTOMY;  Surgeon: Lanelle Bal, DO;  [facility];  Service: Endoscopy;;   TOTAL KNEE ARTHROPLASTY Right 12/06/2022   Procedure: TOTAL KNEE ARTHROPLASTY;  Surgeon: Vickki Hearing, MD;  Location: [facility];  Service: Orthopedics;  Laterality: Right;   UTERINE FIBROID SURGERY     WRIST SURGERY Bilateral    Social History   Occupational History   Not on file  Tobacco Use   Smoking status: Former    Current packs/day: 0.50    Types: Cigarettes   Smokeless tobacco: Never  Vaping Use   Vaping status: Never Used  Substance and Sexual Activity   Alcohol use: Yes    Alcohol/week: 4.0 standard drinks of alcohol    Types: 2 Glasses of wine, 2 Shots of liquor per week    Comment: rarely   Drug use: Never   Sexual activity: Not on file

## 2023-11-03 NOTE — Telephone Encounter (Signed)
Please submit for Prolia.

## 2023-11-05 LAB — ANTI-NUCLEAR AB-TITER (ANA TITER)
ANA TITER: 1:40 {titer} — ABNORMAL HIGH
ANA Titer 1: 1:40 {titer} — ABNORMAL HIGH

## 2023-11-05 LAB — URIC ACID: Uric Acid, Serum: 5 mg/dL (ref 2.5–7.0)

## 2023-11-05 LAB — SEDIMENTATION RATE: Sed Rate: 2 mm/h (ref 0–30)

## 2023-11-05 LAB — RHEUMATOID FACTOR: Rheumatoid fact SerPl-aCnc: <10 [IU]/mL (ref <14)

## 2023-11-05 LAB — ANA: Anti Nuclear Antibody (ANA): POSITIVE — AB

## 2023-11-06 ENCOUNTER — Other Ambulatory Visit: Payer: Self-pay | Admitting: Internal Medicine

## 2023-11-06 ENCOUNTER — Other Ambulatory Visit: Payer: Self-pay | Admitting: Physician Assistant

## 2023-11-06 DIAGNOSIS — M7552 Bursitis of left shoulder: Secondary | ICD-10-CM

## 2023-11-06 DIAGNOSIS — M2550 Pain in unspecified joint: Secondary | ICD-10-CM

## 2023-11-12 ENCOUNTER — Other Ambulatory Visit: Payer: Self-pay | Admitting: Internal Medicine

## 2023-11-12 ENCOUNTER — Other Ambulatory Visit (HOSPITAL_COMMUNITY): Payer: Self-pay

## 2023-11-12 DIAGNOSIS — F331 Major depressive disorder, recurrent, moderate: Secondary | ICD-10-CM

## 2023-11-16 DIAGNOSIS — E1165 Type 2 diabetes mellitus with hyperglycemia: Secondary | ICD-10-CM | POA: Diagnosis not present

## 2023-11-17 ENCOUNTER — Other Ambulatory Visit: Payer: Self-pay | Admitting: Internal Medicine

## 2023-11-17 DIAGNOSIS — E1140 Type 2 diabetes mellitus with diabetic neuropathy, unspecified: Secondary | ICD-10-CM

## 2023-11-19 ENCOUNTER — Encounter (HOSPITAL_BASED_OUTPATIENT_CLINIC_OR_DEPARTMENT_OTHER): Payer: Self-pay | Admitting: Pulmonary Disease

## 2023-11-19 ENCOUNTER — Ambulatory Visit (HOSPITAL_BASED_OUTPATIENT_CLINIC_OR_DEPARTMENT_OTHER): Payer: Medicare HMO | Admitting: Pulmonary Disease

## 2023-11-19 VITALS — BP 132/74 | HR 93 | Ht 62.0 in | Wt 195.0 lb

## 2023-11-19 DIAGNOSIS — G4721 Circadian rhythm sleep disorder, delayed sleep phase type: Secondary | ICD-10-CM | POA: Diagnosis not present

## 2023-11-19 DIAGNOSIS — G4710 Hypersomnia, unspecified: Secondary | ICD-10-CM

## 2023-11-19 NOTE — Patient Instructions (Signed)
 YOUR PLAN:  -DELAYED SLEEP PHASE SYNDROME: Delayed Sleep Phase Syndrome is a condition where your sleep is delayed by two or more hours beyond the conventional bedtime, causing difficulty in waking up in the morning. To help realign your sleep-wake cycle, we recommend exposing yourself to light for 30-60 minutes after waking up, taking 5-10 mg melatonin at 10-11 PM, and setting an alarm to wake up at 12 PM for one week, then shifting to 11 AM the following week & so on until your wake up time is at desired  -POSSIBLE SLEEP APNEA: Sleep apnea is a condition where breathing repeatedly stops and starts during sleep, leading to poor sleep quality and daytime fatigue. We will conduct a home sleep test to evaluate the number of apneic events per hour and oxygen desaturation levels. If sleep apnea is confirmed, we may consider CPAP therapy to help you breathe better during sleep.

## 2023-11-19 NOTE — Progress Notes (Addendum)
 Subjective:    Patient ID: Monique Warren, female    DOB: 1951/05/07, 73 y.o.   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 She reports difficulty falling asleep until early morning hours (between 3-5 AM) and waking up around 1 PM. This shift in her sleep pattern has significantly affected her daily routine and quality of life. Despite getting approximately 8 hours of sleep, she reports feeling tired throughout the day. She also mentions loud breathing during sleep, as noted by her husband, and has occasionally woken herself up. The patient also reports being in a lot of pain at night due to arthritis and bursitis. She has recently switched from Ozempic to insulin for her diabetes management due to cost considerations. She also mentions having her esophagus stretched twice since moving to her current location.  Epworth Sleepiness Scale is 19 and she reports sleepiness while sitting and reading, watching TV, lying down to rest in the afternoon or as a passenger in a car.  She does not drive. Over the last 3 to 4 months bedtime is at 11 PM but can be 4 to 5 hours and sometimes up to 5 AM before she falls asleep.  She then keeps sleeping up to 1 PM deeply without awakenings and still feels tired after waking up.  Snoring has been noted by her husband.  She denies witnessed apneas or choking or gasping episodes in her sleep There is no history suggestive of cataplexy, sleep paralysis or parasomnias   PMH : cerebral artery aneurysm s/p clip,  migraine, asthma,   celiac disease, GERD,  type II DM,   Past Medical History:  Diagnosis Date   Anxiety    Arthritis    Asthma    Celiac disease    Depression    Diabetes mellitus, type II (HCC)    Diabetic neuropathy (HCC)    Diastolic dysfunction    Grade 1 with preserved EF    Dysrhythmia    GERD (gastroesophageal reflux disease)    Heart murmur    Hiatal hernia    Sliding   Hyperlipidemia    Hypertension    Migraine    Obstructive sleep apnea    Osteoporosis    Vertigo    Past Surgical History:  Procedure Laterality Date   ABDOMINAL HYSTERECTOMY  09/29/2003   APPENDECTOMY     BALLOON DILATION N/A 02/08/2022   Procedure: BALLOON DILATION;  Surgeon: Lanelle Bal, DO;  [facility];  Service: Endoscopy;  Laterality: N/A;   BIOPSY  02/08/2022   Procedure: BIOPSY;  Surgeon: Lanelle Bal, DO;  [facility];  Service: Endoscopy;;   BIOPSY  07/11/2023   Procedure: BIOPSY;  Surgeon: Lanelle Bal, DO;  [facility];  Service: Endoscopy;;   BREAST BIOPSY Left    CARPAL TUNNEL RELEASE     CATARACT EXTRACTION Bilateral    October and November 2016   COLONOSCOPY  09/2017   Blake Divine, MD in [place]; 5 mm tubular adenoma in the descending colon s/p resected, mild sigmoid diverticulosis, internal hemorrhoids.  Recommended repeat colonoscopy in 5 years.   COLONOSCOPY WITH PROPOFOL N/A 07/11/2023   Surgeon: Lanelle Bal, DO; Nonbleeding internal hemorrhoids, diverticulosis in the sigmoid colon, 5 mm polyp in the transverse colon resected and retrieved, 8 mm polyp in  the sigmoid colon resected and retrieved, single nonbleeding angiodysplastic lesion.  Pathology with 1 tubular adenoma and 1 hyperplastic polyp.  Recommended 7-year surveillance.   CRANIOTOMY FOR ANEURYSM / VERTEBROBASILAR / CAROTID CIRCULATION Right 10/24/2015   ESOPHAGOGASTRODUODENOSCOPY  09/2017   [name]; irregular Z-line s/p biopsy, decreased LES tone, 4 cm hiatal hernia, erythema in gastric antrum biopsied, normal examined duodenum biopsied.  Esophageal biopsy suggestive of reflux, no Barrett's, gastric biopsy with nonspecific chronic gastritis with intestinal metaplasia, duodenal biopsy with increased intraepithelial lymphocytes without blunting  of villi, nonspecific.   ESOPHAGOGASTRODUODENOSCOPY (EGD) WITH PROPOFOL N/A 02/08/2022   Surgeon: Lanelle Bal, DO; 2 cm hiatal hernia, short segment Barrett's esophagus without dysplasia, mild Schatzki's ring dilated, gastritis with biopsies benign, normal examined duodenum.  Repeat EGD in 5 years.   ESOPHAGOGASTRODUODENOSCOPY (EGD) WITH PROPOFOL N/A 07/11/2023   Surgeon: Lanelle Bal, DO;  cm hiatal hernia, mucosal changes secondary to short segment Barrett's esophagus biopsied, esophageal mucosal changes suspicious for EOE biopsied and dilated, gastritis biopsied. Gastric biopsies benign, all esophageal biopsies benign.   KNEE ARTHROSCOPY WITH MEDIAL MENISECTOMY Left 04/09/2022   Procedure: KNEE ARTHROSCOPY WITH PARTIAL MEDIAL MENISCECTOMY, LATERAL MENISCAL DEBRIDEMENT;  Surgeon: Vickki Hearing, MD;  Location: [facility];  Service: Orthopedics;  Laterality: Left;   KNEE ARTHROSCOPY WITH MENISCAL REPAIR Left 04/09/2022   Procedure: KNEE ARTHROSCOPY WITH MEDIAL MENISCAL REPAIR;  Surgeon: Vickki Hearing, MD;  Location: [facility];  Service: Orthopedics;  Laterality: Left;   LAPAROSCOPY     POLYPECTOMY  07/11/2023   Procedure: POLYPECTOMY;  Surgeon: Lanelle Bal, DO;  [facility];  Service: Endoscopy;;   TOTAL KNEE ARTHROPLASTY Right 12/06/2022   Procedure: TOTAL KNEE ARTHROPLASTY;  Surgeon: Vickki Hearing, MD;  Location: [facility];  Service: Orthopedics;  Laterality: Right;   UTERINE FIBROID SURGERY     WRIST SURGERY Bilateral    Allergies  Allergen Reactions   Cefuroxime Anaphylaxis, Hives, Shortness Of Breath, Swelling and Anxiety   Codeine Hives, Shortness Of Breath, Nausea And Vomiting, Swelling and Anxiety   Morphine Anaphylaxis   Sulfa Antibiotics Anaphylaxis, Hives, Nausea And Vomiting and Swelling    Swollen throat   Sulfamethoxazole-Trimethoprim Shortness Of Breath, Swelling and Anxiety    Throat swelling    Gluten Meal     Severe reflux - celiac  disease    Social History   Socioeconomic History   Marital status: Married    Spouse name: Not on file   Number of children: Not on file   Years of education: Not on file   Highest education level: GED or equivalent  Occupational History   Not on file  Tobacco Use   Smoking status: Former    Current packs/day: 0.50    Types: Cigarettes   Smokeless tobacco: Never  Vaping Use   Vaping status: Never Used  Substance and Sexual Activity   Alcohol use: Yes    Alcohol/week: 4.0 standard drinks of alcohol    Types: 2 Glasses of wine, 2 Shots of liquor per week    Comment: rarely   Drug use: Never   Sexual activity: Not on file  Other Topics Concern   Not on file  Social History Narrative   Not on file   Social Drivers of Health   Financial Resource Strain: Low Risk  (10/15/2023)   Overall Financial Resource Strain (CARDIA)    Difficulty of Paying Living Expenses: Not very hard  Food Insecurity: No Food Insecurity (10/15/2023)   Hunger Vital  Sign    Worried About Programme researcher, broadcasting/film/video in the Last Year: Never true    Ran Out of Food in the Last Year: Never true  Transportation Needs: Unmet Transportation Needs (10/15/2023)   PRAPARE - Transportation    Lack of Transportation (Medical): Yes    Lack of Transportation (Non-Medical): Yes  Physical Activity: Insufficiently Active (10/15/2023)   Exercise Vital Sign    Days of Exercise per Week: 1 day    Minutes of Exercise per Session: 20 min  Stress: No Stress Concern Present (10/15/2023)   [employer] of Occupational Health - Occupational Stress Questionnaire    Feeling of Stress : Only a little  Social Connections: Moderately Integrated (10/15/2023)   Social Connection and Isolation Panel [NHANES]    Frequency of Communication with Friends and Family: More than three times a week    Frequency of Social Gatherings with Friends and Family: More than three times a week    Attends Religious Services: 1 to 4 times per year     Active Member of [employer] or Organizations: No    Attends Banker Meetings: Not on file    Marital Status: Married  Catering manager Violence: Not At Risk (12/06/2022)   Humiliation, Afraid, Rape, and Kick questionnaire    Fear of Current or Ex-Partner: No    Emotionally Abused: No    Physically Abused: No    Sexually Abused: No    Family History  Problem Relation Age of Onset   Breast cancer Mother    Thyroid disease Mother    Stroke Mother    Hypertension Father    Hyperlipidemia Father    Heart attack Father    Heart failure Father    Breast cancer Paternal Grandmother    Cancer - Colon Neg Hx    Gastric cancer Neg Hx    Esophageal cancer Neg Hx    Liver cancer Neg Hx    Autoimmune disease Neg Hx      Review of Systems Constitutional: negative for anorexia, fevers and sweats  Eyes: negative for irritation, redness and visual disturbance  Ears, nose, mouth, throat, and face: negative for earaches, epistaxis, nasal congestion and sore throat  Respiratory: negative for cough, dyspnea on exertion, sputum and wheezing  Cardiovascular: negative for chest pain, dyspnea, lower extremity edema, orthopnea, palpitations and syncope  Gastrointestinal: negative for abdominal pain, constipation, diarrhea, melena, nausea and vomiting  Genitourinary:negative for dysuria, frequency and hematuria  Hematologic/lymphatic: negative for bleeding, easy bruising and lymphadenopathy  Musculoskeletal:negative for arthralgias, muscle weakness and stiff joints  Neurological: negative for coordination problems, gait problems, headaches and weakness  Endocrine: negative for diabetic symptoms including polydipsia, polyuria and weight loss     Objective:   Physical Exam  Gen. 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 This issue has persisted for a few months. No clear trigger identified. Discussed light exposure and melatonin to realign her sleep-wake cycle. Explained gradual adjustments are necessary. - Recommend light exposure for  30-60 minutes after waking. - Suggest melatonin at 10-11 PM to anchor bedtime. - Set an alarm to wake up at 12 PM for one week, then shift to 11 AM the following week.  Possible Sleep Apnea Husband reports loud breathing during sleep, and she occasionally wakes herself up. Despite 8 hours of sleep, she experiences significant daytime fatigue, suggesting poor sleep quality. A home sleep test is warranted. Explained that more than 5 apneic events per hour may indicate sleep apnea. Discussed potential need for CPAP therapy if confirmed. - Order home sleep test. - Evaluate apneic events per hour and oxygen desaturation levels. - Consider CPAP therapy if sleep apnea is confirmed.   Follow-up - Plan follow-up visit after home sleep test results. - Coordinate care with endocrinologist and rheumatologist as needed.

## 2023-11-27 NOTE — Telephone Encounter (Signed)
 VOB previously submitted for Prolia PA required

## 2023-12-04 ENCOUNTER — Encounter

## 2023-12-04 DIAGNOSIS — G4710 Hypersomnia, unspecified: Secondary | ICD-10-CM

## 2023-12-04 DIAGNOSIS — G4730 Sleep apnea, unspecified: Secondary | ICD-10-CM | POA: Diagnosis not present

## 2023-12-11 ENCOUNTER — Ambulatory Visit: Payer: Medicare HMO | Admitting: Orthopedic Surgery

## 2023-12-11 ENCOUNTER — Other Ambulatory Visit (INDEPENDENT_AMBULATORY_CARE_PROVIDER_SITE_OTHER): Payer: Self-pay

## 2023-12-11 DIAGNOSIS — M1711 Unilateral primary osteoarthritis, right knee: Secondary | ICD-10-CM | POA: Diagnosis not present

## 2023-12-11 DIAGNOSIS — Z96651 Presence of right artificial knee joint: Secondary | ICD-10-CM | POA: Diagnosis not present

## 2023-12-11 NOTE — Progress Notes (Signed)
   There were no vitals taken for this visit.  There is no height or weight on file to calculate BMI.  Chief Complaint  Patient presents with   Post-op Follow-up    Right knee     Encounter Diagnoses  Name Primary?   S/P total knee replacement, right 12/06/22 Yes   Primary osteoarthritis of right knee     DOI/DOS/ Date: 12/06/22  Improved

## 2023-12-11 NOTE — Progress Notes (Signed)
 ANNUAL FOLLOW UP FOR right TKA   Chief Complaint  Patient presents with   Post-op Follow-up    Right knee      HPI: The patient is here for the annual  follow-up x-ray for knee replacement. The patient is not complaining of pain weakness instability or stiffness in the repaired knee.  Her only complaint is having to go down the steps 1 at a time.  She goes up the steps normally.  ROS multiple joint aches and pains scheduled to see rheumatology this summer     Examination of the right KNEE  There were no vitals taken for this visit. General the patient is normally groomed in no distress Inspection shows : incision healed nicely without erythema, no tenderness no swelling Range of motion total range of motion is 3-120 Stability the knee is stable anterior to posterior as well as medial to lateral Strength quadriceps strength is normal Skin no erythema around the skin incision Neuro: normal sensation in the operative leg  Gait: normal expected gait without cane    Medical decision-making section X-rays ordered, internal imaging shows (see full dictated report) stable implant with no signs of loosening  Diagnosis  Encounter Diagnoses  Name Primary?   S/P total knee replacement, right 12/06/22 Yes   Primary osteoarthritis of right knee      Plan follow-up as needed

## 2023-12-16 ENCOUNTER — Ambulatory Visit: Payer: Medicare HMO | Admitting: Nurse Practitioner

## 2023-12-16 ENCOUNTER — Encounter: Payer: Self-pay | Admitting: Nurse Practitioner

## 2023-12-16 VITALS — BP 108/68 | HR 74 | Ht 62.0 in | Wt 196.8 lb

## 2023-12-16 DIAGNOSIS — I10 Essential (primary) hypertension: Secondary | ICD-10-CM

## 2023-12-16 DIAGNOSIS — E559 Vitamin D deficiency, unspecified: Secondary | ICD-10-CM

## 2023-12-16 DIAGNOSIS — E782 Mixed hyperlipidemia: Secondary | ICD-10-CM

## 2023-12-16 DIAGNOSIS — E1140 Type 2 diabetes mellitus with diabetic neuropathy, unspecified: Secondary | ICD-10-CM

## 2023-12-16 DIAGNOSIS — Z7985 Long-term (current) use of injectable non-insulin antidiabetic drugs: Secondary | ICD-10-CM | POA: Diagnosis not present

## 2023-12-16 LAB — POCT GLYCOSYLATED HEMOGLOBIN (HGB A1C): Hemoglobin A1C: 6.6 % — AB (ref 4.0–5.6)

## 2023-12-16 NOTE — Progress Notes (Signed)
 Endocrinology Follow Up Note       12/16/2023, 3:05 PM   Subjective:    Patient ID: Monique Warren, female    DOB: 05/15/1951.  Monique Warren is being seen in follow up after being seen in consultation for management of currently uncontrolled symptomatic diabetes requested by  Anabel Halon, MD.   Past Medical History:  Diagnosis Date   Anxiety    Arthritis    Asthma    Celiac disease    Depression    Diabetes mellitus, type II (HCC)    Diabetic neuropathy (HCC)    Diastolic dysfunction    Grade 1 with preserved EF   Dysrhythmia    GERD (gastroesophageal reflux disease)    Heart murmur    Hiatal hernia    Sliding   Hyperlipidemia    Hypertension    Migraine    Obstructive sleep apnea    Osteoporosis    Vertigo     Past Surgical History:  Procedure Laterality Date   ABDOMINAL HYSTERECTOMY  09/29/2003   APPENDECTOMY     BALLOON DILATION N/A 02/08/2022   Procedure: BALLOON DILATION;  Surgeon: Lanelle Bal, DO;  [facility];  Service: Endoscopy;  Laterality: N/A;   BIOPSY  02/08/2022   Procedure: BIOPSY;  Surgeon: Lanelle Bal, DO;  [facility];  Service: Endoscopy;;   BIOPSY  07/11/2023   Procedure: BIOPSY;  Surgeon: Lanelle Bal, DO;  [facility]: [facility];  Service: Endoscopy;;   BREAST BIOPSY Left    CARPAL TUNNEL RELEASE     CATARACT EXTRACTION Bilateral    October and November 2016   COLONOSCOPY  09/2017   Blake Divine, MD in [place]; 5 mm tubular adenoma in the descending colon s/p resected, mild sigmoid diverticulosis, internal hemorrhoids.  Recommended repeat colonoscopy in 5 years.   COLONOSCOPY WITH PROPOFOL N/A 07/11/2023   Surgeon: Lanelle Bal, DO; Nonbleeding internal hemorrhoids, diverticulosis in the sigmoid colon, 5 mm polyp in the transverse colon resected and retrieved, 8 mm polyp in the sigmoid colon resected and retrieved,  single nonbleeding angiodysplastic lesion.  Pathology with 1 tubular adenoma and 1 hyperplastic polyp.  Recommended 7-year surveillance.   CRANIOTOMY FOR ANEURYSM / VERTEBROBASILAR / CAROTID CIRCULATION Right 10/24/2015   ESOPHAGOGASTRODUODENOSCOPY  09/2017   [name]; irregular Z-line s/p biopsy, decreased LES tone, 4 cm hiatal hernia, erythema in gastric antrum biopsied, normal examined duodenum biopsied.  Esophageal biopsy suggestive of reflux, no Barrett's, gastric biopsy with nonspecific chronic gastritis with intestinal metaplasia, duodenal biopsy with increased intraepithelial lymphocytes without blunting of villi, nonspecific.   ESOPHAGOGASTRODUODENOSCOPY (EGD) WITH PROPOFOL N/A 02/08/2022   Surgeon: Lanelle Bal, DO; 2 cm hiatal hernia, short segment Barrett's esophagus without dysplasia, mild Schatzki's ring dilated, gastritis with biopsies benign, normal examined duodenum.  Repeat EGD in 5 years.   ESOPHAGOGASTRODUODENOSCOPY (EGD) WITH PROPOFOL N/A 07/11/2023   Surgeon: Lanelle Bal, DO;  cm hiatal hernia, mucosal changes secondary to short segment Barrett's esophagus biopsied, esophageal mucosal changes suspicious for EOE biopsied and dilated, gastritis biopsied. Gastric biopsies benign, all esophageal biopsies benign.   KNEE ARTHROSCOPY WITH MEDIAL MENISECTOMY Left 04/09/2022  Procedure: KNEE ARTHROSCOPY WITH PARTIAL MEDIAL MENISCECTOMY, LATERAL MENISCAL DEBRIDEMENT;  Surgeon: Vickki Hearing, MD;  Location: [facility];  Service: Orthopedics;  Laterality: Left;   KNEE ARTHROSCOPY WITH MENISCAL REPAIR Left 04/09/2022   Procedure: KNEE ARTHROSCOPY WITH MEDIAL MENISCAL REPAIR;  Surgeon: Vickki Hearing, MD;  Location: [facility];  Service: Orthopedics;  Laterality: Left;   LAPAROSCOPY     POLYPECTOMY  07/11/2023   Procedure: POLYPECTOMY;  Surgeon: Lanelle Bal, DO;  [facility];  Service: Endoscopy;;   TOTAL KNEE ARTHROPLASTY Right 12/06/2022   Procedure: TOTAL  KNEE ARTHROPLASTY;  Surgeon: Vickki Hearing, MD;  Location: [facility];  Service: Orthopedics;  Laterality: Right;   UTERINE FIBROID SURGERY     WRIST SURGERY Bilateral     Social History   Socioeconomic History   Marital status: Married    Spouse name: Not on file   Number of children: Not on file   Years of education: Not on file   Highest education level: GED or equivalent  Occupational History   Not on file  Tobacco Use   Smoking status: Former    Current packs/day: 0.50    Types: Cigarettes   Smokeless tobacco: Never  Vaping Use   Vaping status: Never Used  Substance and Sexual Activity   Alcohol use: Yes    Alcohol/week: 4.0 standard drinks of alcohol    Types: 2 Glasses of wine, 2 Shots of liquor per week    Comment: rarely   Drug use: Never   Sexual activity: Not on file  Other Topics Concern   Not on file  Social History Narrative   Not on file   Social Drivers of Health   Financial Resource Strain: Low Risk  (10/15/2023)   Overall Financial Resource Strain (CARDIA)    Difficulty of Paying Living Expenses: Not very hard  Food Insecurity: No Food Insecurity (10/15/2023)   Hunger Vital Sign    Worried About Running Out of Food in the Last Year: Never true    Ran Out of Food in the Last Year: Never true  Transportation Needs: Unmet Transportation Needs (10/15/2023)   PRAPARE - Transportation    Lack of Transportation (Medical): Yes    Lack of Transportation (Non-Medical): Yes  Physical Activity: Insufficiently Active (10/15/2023)   Exercise Vital Sign    Days of Exercise per Week: 1 day    Minutes of Exercise per Session: 20 min  Stress: No Stress Concern Present (10/15/2023)   [employer] of Occupational Health - Occupational Stress Questionnaire    Feeling of Stress : Only a little  Social Connections: Moderately Integrated (10/15/2023)   Social Connection and Isolation Panel [NHANES]    Frequency of Communication with Friends and Family: More than  three times a week    Frequency of Social Gatherings with Friends and Family: More than three times a week    Attends Religious Services: 1 to 4 times per year    Active Member of [employer] or Organizations: No    Attends Engineer, structural: Not on file    Marital Status: Married    Family History  Problem Relation Age of Onset   Breast cancer Mother    Thyroid disease Mother    Stroke Mother    Hypertension Father    Hyperlipidemia Father    Heart attack Father    Heart failure Father    Breast cancer Paternal Grandmother    Cancer - Colon Neg Hx  Gastric cancer Neg Hx    Esophageal cancer Neg Hx    Liver cancer Neg Hx    Autoimmune disease Neg Hx     Outpatient Encounter Medications as of 12/16/2023  Medication Sig   acetaminophen (TYLENOL) 500 MG tablet Take 1 tablet (500 mg total) by mouth every 6 (six) hours as needed for moderate pain.   AIMOVIG 140 MG/ML SOAJ INJECT 140 MG UNDER THE SKIN EVERY 30 DAYS   albuterol (VENTOLIN HFA) 108 (90 Base) MCG/ACT inhaler USE 2 INHALATIONS EVERY 6 HOURS AS NEEDED FOR WHEEZING OR SHORTNESS OF BREATH   atorvastatin (LIPITOR) 40 MG tablet TAKE 1 TABLET AT BEDTIME   Blood Glucose Monitoring Suppl (ONE TOUCH ULTRA 2) w/Device KIT USE TO CHECK BLOOD GLUCOSE 4 TIMES A DAY   Cholecalciferol (VITAMIN D3) 125 MCG (5000 UT) capsule Take 5,000 Units by mouth daily.   diltiazem (CARDIZEM SR) 120 MG 12 hr capsule Take 2 capsules (240 mg total) by mouth daily.   DULoxetine (CYMBALTA) 60 MG capsule TAKE 1 CAPSULE DAILY   esomeprazole (NEXIUM) 40 MG capsule Take 1 capsule (40 mg total) by mouth 2 (two) times daily before a meal.   Estradiol 10 MCG TABS vaginal tablet Place 10 mcg vaginally every Monday.   famotidine (PEPCID) 40 MG tablet Take 1 tablet (40 mg total) by mouth 2 (two) times daily.   Fluticasone-Salmeterol,sensor, (AIRDUO DIGIHALER) 232-14 MCG/ACT AEPB Inhale 1 Inhalation into the lungs 2 (two) times daily. USE 1 INHALATION TWICE A  DAY Strength: 232-14 MCG/ACT   gabapentin (NEURONTIN) 300 MG capsule TAKE 1 CAPSULE AT BEDTIME   insulin glargine (LANTUS SOLOSTAR) 100 UNIT/ML Solostar Pen Inject 15 Units into the skin at bedtime.   Insulin Pen Needle (PEN NEEDLES) 31G X 8 MM MISC Use to inject insulin once daily   Lancets (ONETOUCH DELICA PLUS LANCET33G) MISC Use to check glucose once daily   meclizine (ANTIVERT) 25 MG tablet Take 1 tablet (25 mg total) by mouth 3 (three) times daily as needed for dizziness.   Multiple Vitamin (MULTIVITAMIN WITH MINERALS) TABS tablet Take 1 tablet by mouth daily.   ONETOUCH ULTRA test strip Use to monitor glucose once daily   OVER THE COUNTER MEDICATION Take 2 oz by mouth daily. Dose (herbal supplement), liver detox, taking for constipation   PROLIA 60 MG/ML SOSY injection INJECT 60 MG UNDER THE SKIN EVERY 6 MONTHS   promethazine (PHENERGAN) 12.5 MG tablet Take 1 tablet (12.5 mg total) by mouth every 6 (six) hours as needed for nausea or vomiting.   traMADol (ULTRAM) 50 MG tablet TAKE 1 TABLET BY MOUTH EVERY 12 HOURS AS NEEDED   triamcinolone cream (KENALOG) 0.1 % Apply 1 Application topically 2 (two) times daily.   venlafaxine (EFFEXOR) 37.5 MG tablet TAKE 1 TABLET TWICE A DAY   zolmitriptan (ZOMIG) 5 MG tablet Take 5 mg by mouth daily as needed for migraine.   Facility-Administered Encounter Medications as of 12/16/2023  Medication   bupivacaine-meloxicam ER (ZYNRELEF) injection 400 mg   bupivacaine-meloxicam ER (ZYNRELEF) injection 400 mg    ALLERGIES: Allergies  Allergen Reactions   Cefuroxime Anaphylaxis, Hives, Shortness Of Breath, Swelling and Anxiety   Codeine Hives, Shortness Of Breath, Nausea And Vomiting, Swelling and Anxiety   Morphine Anaphylaxis   Sulfa Antibiotics Anaphylaxis, Hives, Nausea And Vomiting and Swelling    Swollen throat   Sulfamethoxazole-Trimethoprim Shortness Of Breath, Swelling and Anxiety    Throat swelling    Gluten Meal     Severe  reflux - celiac  disease    VACCINATION STATUS: Immunization History  Administered Date(s) Administered   Fluad Quad(high Dose 65+) 07/02/2021, 06/05/2022   Fluad Trivalent(High Dose 65+) 06/16/2023   Influenza Split 07/26/2014   Influenza, High Dose Seasonal PF 09/22/2018   Influenza-Unspecified 07/26/2014   PFIZER(Purple Top)SARS-COV-2 Vaccination 12/01/2019, 12/29/2019   Pneumococcal Conjugate-13 08/02/2019   Pneumococcal Polysaccharide-23 07/28/2020   Tdap 09/30/2016   Zoster Recombinant(Shingrix) 06/03/2018, 11/10/2019    Diabetes She presents for her follow-up diabetic visit. She has type 2 diabetes mellitus. Onset time: Diagnosed at approx age of 62. Her disease course has been improving. There are no hypoglycemic associated symptoms. Associated symptoms include chest pain (ongoing work up with cardiology) and foot paresthesias. Pertinent negatives for diabetes include no polyuria and no weight loss. There are no hypoglycemic complications. Symptoms are stable. Diabetic complications include nephropathy and peripheral neuropathy. Risk factors for coronary artery disease include diabetes mellitus, dyslipidemia, family history, hypertension, obesity, post-menopausal and sedentary lifestyle. Current diabetic treatment includes insulin injections (stopped Ozempic between visits due to cost). She is compliant with treatment all of the time. Her weight is fluctuating minimally. She is following a generally healthy diet. When asked about meal planning, she reported none. She has not had a previous visit with a dietitian. She participates in exercise intermittently. Her home blood glucose trend is increasing steadily. Her breakfast blood glucose range is generally 140-180 mg/dl. Her bedtime blood glucose range is generally 180-200 mg/dl. (She presents today with her meter and logs showing above target glycemic profile overall.  Her POCT A1c today is 6.6%, unchanged from last visit.  She got the Dexcom but did not  care for it, alarmed too much for her.  Analysis of her meter shows 7-day average of 175, 14-day average of 192, 30-day average of 182.  She notes she just has not felt well since stopping the Ozempic and wants to restart if she can afford it.) An ACE inhibitor/angiotensin II receptor blocker is being taken. She does not see a podiatrist.Eye exam is current (has eye exam later today).  Hyperlipidemia This is a chronic problem. The current episode started more than 1 year ago. The problem is uncontrolled. Recent lipid tests were reviewed and are high. Exacerbating diseases include chronic renal disease, diabetes and obesity. Factors aggravating her hyperlipidemia include fatty foods. Associated symptoms include chest pain (ongoing work up with cardiology). Current antihyperlipidemic treatment includes statins. The current treatment provides moderate improvement of lipids. There are no compliance problems.  Risk factors for coronary artery disease include diabetes mellitus, dyslipidemia, family history, obesity, hypertension, post-menopausal and a sedentary lifestyle.  Hypertension This is a chronic problem. The current episode started more than 1 year ago. The problem has been gradually improving since onset. The problem is controlled. Associated symptoms include chest pain (ongoing work up with cardiology). There are no associated agents to hypertension. Risk factors for coronary artery disease include diabetes mellitus, dyslipidemia, family history, obesity, post-menopausal state and sedentary lifestyle. Past treatments include ACE inhibitors. The current treatment provides moderate improvement. There are no compliance problems.  Hypertensive end-organ damage includes kidney disease. Identifiable causes of hypertension include chronic renal disease and sleep apnea.     Review of systems  Constitutional: + minimally fluctuating body weight, current Body mass index is 36 kg/m., no fatigue, no subjective  hyperthermia, no subjective hypothermia Eyes: no blurry vision, no xerophthalmia ENT: no sore throat, no nodules palpated in throat, no dysphagia/odynophagia, no hoarseness Cardiovascular: no chest pain, no  shortness of breath, no palpitations, no leg swelling Respiratory: no cough, no shortness of breath Gastrointestinal: no nausea/vomiting/diarrhea Musculoskeletal: + diffuse muscle/joint aches Skin: no rashes, no hyperemia Neurological: no tremors, + numbness/tingling to BLE, no dizziness Psychiatric: no depression, no anxiety  Objective:     BP 108/68 (BP Location: Right Arm, Patient Position: Sitting, Cuff Size: Large)   Pulse 74   Ht 5\' 2"  (1.575 m)   Wt 196 lb 12.8 oz (89.3 kg)   BMI 36.00 kg/m   Wt Readings from Last 3 Encounters:  12/16/23 196 lb 12.8 oz (89.3 kg)  11/19/23 195 lb (88.5 kg)  10/20/23 198 lb 9.6 oz (90.1 kg)     BP Readings from Last 3 Encounters:  12/16/23 108/68  11/19/23 132/74  10/20/23 138/76      Physical Exam- Limited  Constitutional:  Body mass index is 36 kg/m. , not in acute distress, normal state of mind Eyes:  EOMI, no exophthalmos Musculoskeletal: no gross deformities, strength intact in all four extremities, no gross restriction of joint movements Skin:  no rashes, no hyperemia Neurological: no tremor with outstretched hands    Diabetic Foot Exam - Simple   No data filed     CMP ( most recent) CMP     Component Value Date/Time   NA 142 06/16/2023 1129   K 4.6 06/16/2023 1129   CL 106 06/16/2023 1129   CO2 22 06/16/2023 1129   GLUCOSE 102 (H) 06/16/2023 1129   GLUCOSE 181 (H) 12/07/2022 0557   BUN 14 06/16/2023 1129   CREATININE 0.87 06/16/2023 1129   CALCIUM 9.0 06/16/2023 1129   PROT 6.4 06/16/2023 1129   ALBUMIN 4.1 06/16/2023 1129   AST 19 06/16/2023 1129   ALT 22 06/16/2023 1129   ALKPHOS 94 06/16/2023 1129   BILITOT 0.3 06/16/2023 1129   GFRNONAA >60 12/07/2022 0557     Diabetic Labs (most recent): Lab  Results  Component Value Date   HGBA1C 6.6 (A) 12/16/2023   HGBA1C 6.6 (A) 09/15/2023   HGBA1C 5.7 (H) 06/16/2023     Lipid Panel ( most recent) Lipid Panel     Component Value Date/Time   CHOL 202 (H) 06/16/2023 1129   TRIG 137 06/16/2023 1129   HDL 51 06/16/2023 1129   CHOLHDL 4.0 06/16/2023 1129   LDLCALC 127 (H) 06/16/2023 1129   LABVLDL 24 06/16/2023 1129      Lab Results  Component Value Date   TSH 1.520 06/16/2023   TSH 1.780 06/05/2022   TSH 2.830 10/02/2021   FREET4 1.08 10/02/2021           Assessment & Plan:   1) Type 2 diabetes mellitus with diabetic neuropathy, without long-term current use of insulin (HCC)  She presents today with her meter and logs showing above target glycemic profile overall.  Her POCT A1c today is 6.6%, unchanged from last visit.  She got the Dexcom but did not care for it, alarmed too much for her.  Analysis of her meter shows 7-day average of 175, 14-day average of 192, 30-day average of 182.  She notes she just has not felt well since stopping the Ozempic and wants to restart if she can afford it.  - Monique Warren has currently uncontrolled symptomatic type 2 DM since 73 years of age.  -Recent labs reviewed.  - I had a long discussion with her about the progressive nature of diabetes and the pathology behind its complications. -her diabetes is complicated by mild  CKD, peripheral neuropathy and she remains at a high risk for more acute and chronic complications which include CAD, CVA, CKD, retinopathy, and neuropathy. These are all discussed in detail with her.  - Nutritional counseling repeated at each appointment due to patients tendency to fall back in to old habits.  - The patient admits there is a room for improvement in their diet and drink choices. -  Suggestion is made for the patient to avoid simple carbohydrates from their diet including Cakes, Sweet Desserts / Pastries, Ice Cream, Soda (diet and regular), Sweet Tea,  Candies, Chips, Cookies, Sweet Pastries, Store Bought Juices, Alcohol in Excess of 1-2 drinks a day, Artificial Sweeteners, Coffee Creamer, and "Sugar-free" Products. This will help patient to have stable blood glucose profile and potentially avoid unintended weight gain.   - I encouraged the patient to switch to unprocessed or minimally processed complex starch and increased protein intake (animal or plant source), fruits, and vegetables.   - Patient is advised to stick to a routine mealtimes to eat 3 meals a day and avoid unnecessary snacks (to snack only to correct hypoglycemia).  - I have approached her with the following individualized plan to manage her diabetes and patient agrees:   -She is advised to continue Lantus 24 units SQ nightly (gave her sample Toujeo pen) until the Ozempic gets back into her system.  The we will plan to taper her off the insulin.  She still has some 2 mg pens at home, I instructed her to listen to the clicks for certain doses.  I advised her NOT to restart the high dose as she will certainly experience some unpleasant side effects.  I instructed her to start at 0.25 mg SQ weekly (9 clicks) for 2 weeks, then increase to 0.5 mg SQ weekly (18 clicks) for at least 1 month after and reach out with readings.   -she is advised to check glucose twice daily, before breakfast and before bed, and to call the clinic if she has readings less than 70 or above 300 for 3 tests in a row.  She did not care for the Dexcom CGM, says it alarmed to much for her.  She prefers to do traditional fingersticks.  - She does not tolerate Metformin due to GI upset (r/t her Celiac disease) and has allergy to sulfa meds.  - Specific targets for  A1c; LDL, HDL, and Triglycerides were discussed with the patient.  2) Blood Pressure /Hypertension:  her blood pressure is controlled to target.   she is advised to continue her current medications including Lisinopril 20 mg p.o. daily with  breakfast.  3) Lipids/Hyperlipidemia:    Her most recent lipid panel from 06/16/23 shows uncontrolled LDL of 127 (improving).  Her PCP counseled her on this and advised she restart it which she has.  She is advised to continue Lipitor 40 mg po daily at bedtime.  Side effects and precautions discussed with her.    4)  Weight/Diet:  her Body mass index is 36 kg/m.  -  clearly complicating her diabetes care.   she is a candidate for weight loss. I discussed with her the fact that loss of 5 - 10% of her  current body weight will have the most impact on her diabetes management.  Exercise, and detailed carbohydrates information provided  -  detailed on discharge instructions.  5) Chronic Care/Health Maintenance: -she is on ACEI/ARB and Statin medications and is encouraged to initiate and continue to follow up with  Ophthalmology, Dentist, Podiatrist at least yearly or according to recommendations, and advised to stay away from smoking. I have recommended yearly flu vaccine and pneumonia vaccine at least every 5 years; moderate intensity exercise for up to 150 minutes weekly; and sleep for at least 7 hours a day.  - she is advised to maintain close follow up with Anabel Halon, MD for primary care needs, as well as her other providers for optimal and coordinated care.     I spent  46  minutes in the care of the patient today including review of labs from CMP, Lipids, Thyroid Function, Hematology (current and previous including abstractions from other facilities); face-to-face time discussing  her blood glucose readings/logs, discussing hypoglycemia and hyperglycemia episodes and symptoms, medications doses, her options of short and long term treatment based on the latest standards of care / guidelines;  discussion about incorporating lifestyle medicine;  and documenting the encounter. Risk reduction counseling performed per USPSTF guidelines to reduce obesity and cardiovascular risk factors.     Please  refer to Patient Instructions for Blood Glucose Monitoring and Insulin/Medications Dosing Guide"  in media tab for additional information. Please  also refer to " Patient Self Inventory" in the Media  tab for reviewed elements of pertinent patient history.  Kathyrn Sheriff participated in the discussions, expressed understanding, and voiced agreement with the above plans.  All questions were answered to her satisfaction. she is encouraged to contact clinic should she have any questions or concerns prior to her return visit.     Follow up plan: - Return in about 3 months (around 03/16/2024) for Diabetes F/U with A1c in office, No previsit labs, Bring meter and logs.  Ronny Bacon, [facility] [address] [place], [place] [address] Phone: [phone] Fax: [phone]  12/16/2023, 3:05 PM

## 2023-12-17 DIAGNOSIS — E1165 Type 2 diabetes mellitus with hyperglycemia: Secondary | ICD-10-CM | POA: Diagnosis not present

## 2023-12-19 ENCOUNTER — Telehealth: Payer: Self-pay

## 2023-12-19 NOTE — Telephone Encounter (Signed)
 Submitted PA to [facility] for patient today 12-19-23 for Prolia

## 2023-12-28 ENCOUNTER — Other Ambulatory Visit: Payer: Self-pay | Admitting: Internal Medicine

## 2023-12-28 DIAGNOSIS — M7552 Bursitis of left shoulder: Secondary | ICD-10-CM

## 2024-01-07 DIAGNOSIS — G4733 Obstructive sleep apnea (adult) (pediatric): Secondary | ICD-10-CM | POA: Diagnosis not present

## 2024-01-16 DIAGNOSIS — E1165 Type 2 diabetes mellitus with hyperglycemia: Secondary | ICD-10-CM | POA: Diagnosis not present

## 2024-01-19 ENCOUNTER — Ambulatory Visit: Payer: Medicare HMO | Admitting: Gastroenterology

## 2024-01-23 ENCOUNTER — Other Ambulatory Visit: Payer: Self-pay | Admitting: Internal Medicine

## 2024-01-23 DIAGNOSIS — E782 Mixed hyperlipidemia: Secondary | ICD-10-CM

## 2024-01-30 ENCOUNTER — Encounter: Payer: Self-pay | Admitting: Nurse Practitioner

## 2024-02-03 MED ORDER — SEMAGLUTIDE (2 MG/DOSE) 8 MG/3ML ~~LOC~~ SOPN: 2.0000 mg | PEN_INJECTOR | SUBCUTANEOUS | 3 refills | Status: DC

## 2024-02-11 ENCOUNTER — Other Ambulatory Visit: Payer: Self-pay | Admitting: Internal Medicine

## 2024-02-11 DIAGNOSIS — G43019 Migraine without aura, intractable, without status migrainosus: Secondary | ICD-10-CM

## 2024-02-12 ENCOUNTER — Ambulatory Visit: Payer: Medicare HMO | Admitting: Internal Medicine

## 2024-02-15 ENCOUNTER — Other Ambulatory Visit: Payer: Self-pay | Admitting: Internal Medicine

## 2024-02-15 DIAGNOSIS — F331 Major depressive disorder, recurrent, moderate: Secondary | ICD-10-CM

## 2024-02-16 DIAGNOSIS — E1165 Type 2 diabetes mellitus with hyperglycemia: Secondary | ICD-10-CM | POA: Diagnosis not present

## 2024-02-17 ENCOUNTER — Ambulatory Visit (HOSPITAL_BASED_OUTPATIENT_CLINIC_OR_DEPARTMENT_OTHER): Admitting: Pulmonary Disease

## 2024-02-20 ENCOUNTER — Encounter: Payer: Self-pay | Admitting: Internal Medicine

## 2024-02-23 ENCOUNTER — Ambulatory Visit (HOSPITAL_BASED_OUTPATIENT_CLINIC_OR_DEPARTMENT_OTHER): Admitting: Nurse Practitioner

## 2024-02-23 ENCOUNTER — Other Ambulatory Visit: Payer: Self-pay | Admitting: Internal Medicine

## 2024-02-23 ENCOUNTER — Other Ambulatory Visit: Payer: Self-pay | Admitting: Gastroenterology

## 2024-02-23 ENCOUNTER — Encounter (HOSPITAL_BASED_OUTPATIENT_CLINIC_OR_DEPARTMENT_OTHER): Payer: Self-pay | Admitting: Nurse Practitioner

## 2024-02-23 ENCOUNTER — Telehealth: Payer: Self-pay

## 2024-02-23 VITALS — BP 102/64 | HR 80 | Ht 62.0 in | Wt 185.0 lb

## 2024-02-23 DIAGNOSIS — E6609 Other obesity due to excess calories: Secondary | ICD-10-CM

## 2024-02-23 DIAGNOSIS — M7552 Bursitis of left shoulder: Secondary | ICD-10-CM

## 2024-02-23 DIAGNOSIS — F5101 Primary insomnia: Secondary | ICD-10-CM | POA: Diagnosis not present

## 2024-02-23 DIAGNOSIS — K219 Gastro-esophageal reflux disease without esophagitis: Secondary | ICD-10-CM

## 2024-02-23 DIAGNOSIS — Z87891 Personal history of nicotine dependence: Secondary | ICD-10-CM

## 2024-02-23 DIAGNOSIS — Z6833 Body mass index (BMI) 33.0-33.9, adult: Secondary | ICD-10-CM

## 2024-02-23 DIAGNOSIS — E669 Obesity, unspecified: Secondary | ICD-10-CM | POA: Diagnosis not present

## 2024-02-23 DIAGNOSIS — G4739 Other sleep apnea: Secondary | ICD-10-CM

## 2024-02-23 DIAGNOSIS — G4700 Insomnia, unspecified: Secondary | ICD-10-CM | POA: Insufficient documentation

## 2024-02-23 MED ORDER — ZALEPLON 5 MG PO CAPS: 5.0000 mg | ORAL_CAPSULE | Freq: Every evening | ORAL | 0 refills | Status: DC | PRN

## 2024-02-23 MED ORDER — RAMELTEON 8 MG PO TABS
8.0000 mg | ORAL_TABLET | Freq: Every day | ORAL | 0 refills | Status: DC
Start: 2024-02-23 — End: 2024-02-23

## 2024-02-23 NOTE — Telephone Encounter (Signed)
 Please notify pt ramelteon not covered. Will try zaleplon (Sonata). This has a shorter half life so shouldn't cause issues with AM grogginess; good for sleep onset. Use caution when getting up at night. May increase risk for falls given increased drowsiness. No driving within 7-8 hours of taking. Monitor for any mood changes. Do not take with other sedating medications. Thanks.

## 2024-02-23 NOTE — Patient Instructions (Signed)
 Your sleep study showed very severe sleep apnea. I have ordered an urgent in lab titration study to ensure you do well on CPAP or see if you need a BiPAP machine.   We discussed how untreated sleep apnea puts an individual at risk for cardiac arrhthymias, pulm HTN, DM, stroke and increases their risk for daytime accidents. We also briefly reviewed treatment options including weight loss, side sleeping position, oral appliance, CPAP therapy or referral to ENT for possible surgical options  Start ramelteon 8 mg At bedtime as needed for sleep. Take immediately before bed. Ensure you have 7-8 hours in the bed after taking. Monitor for any mood changes or changes in sleep habits. Stop and notify immediately if these occur. Do not drive or operate heavy machinery after taking. Do not take with alcohol or other sedating medications. May cause some morning grogginess or vivid dreams.   Follow up in 10-12 weeks to see how PAP therapy and sleep aid is working with Dr. Villa Greaser or Gina Lagos. If symptoms do not improve or worsen, please contact office for sooner follow up or seek emergency care.

## 2024-02-23 NOTE — Assessment & Plan Note (Signed)
 BMI 33. Healthy weight management encouraged

## 2024-02-23 NOTE — Telephone Encounter (Signed)
 Pt.notified

## 2024-02-23 NOTE — Assessment & Plan Note (Signed)
 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 Prior issues with tolerating CPAP. Given severity, central events, and history, recommend urgent in lab CPAP titration study. May need Bilevel support for adequate control. Reviewed risks of untreated severe OSA and treatment options. Shared decision to start PAP given severity. Would not be a candidate for hypoglossal nerve stimulator due to percentage of central events. Advised to continue refraining from driving until symptoms are resolved. Healthy weight management encouraged.  Patient Instructions  Your sleep study showed very severe sleep apnea. I have ordered an urgent in lab titration study to ensure you do well on CPAP or see if you need a BiPAP machine.   We discussed how untreated sleep apnea puts an individual at risk for cardiac arrhthymias, pulm HTN, DM, stroke and increases their risk for daytime accidents. We also briefly reviewed treatment options including weight loss, side sleeping position, oral appliance, CPAP therapy or referral to ENT for possible surgical options  Start ramelteon 8 mg At bedtime as needed for sleep. Take immediately before bed. Ensure you have 7-8 hours in the bed after taking. Monitor for any mood changes or changes in sleep habits. Stop and notify immediately if these occur. Do not drive or operate heavy machinery after taking. Do not take with alcohol or other sedating medications. May cause some morning grogginess or vivid dreams.   Follow up in 10-12 weeks to see how PAP therapy and sleep aid is working with Dr. Villa Greaser or Gina Lagos. If symptoms do not improve or worsen, please contact office for sooner follow up or seek emergency care.

## 2024-02-23 NOTE — Telephone Encounter (Signed)
*  Pulm  Pharmacy Patient Advocate Encounter   Received notification from CoverMyMeds that prior authorization for Ramelteon 8MG  tablets  is required/requested.   Insurance verification completed.   The patient is insured through [facility] .   Per test claim: PA required; PA started via CoverMyMeds. KEY [id number] . Please see clinical question(s) below that I am not finding the answer to in their chart and advise.   Some preferred alternatives are Dayvigo, Doxepin, Temazepam 15 or 30mg , Zaleplon, Zolpidem- wanted to make sure the patient has not tried any of these as I did not see them in the chart.

## 2024-02-23 NOTE — Progress Notes (Signed)
 @Patient  ID: Monique Warren, female    DOB: [birth year], 73 y.o.   MRN: [id number]  Chief Complaint  Patient presents with   Follow-up    Hypersomnolence    Referring provider: Meldon Sport, MD  HPI: 73 year old female, former smoker followed for complex OSA and insomnia. She is a patient of Dr. Hortense Lyons and last seen in office 11/19/2023. Past medical history significant for PVD, migraines, HTN, hital hernia, asthma, GERD, Celiac disease, Barrett's esophagus, DM, vertigo, OA, depression, HLD.   TEST/EVENTS:  12/04/2023 HST: AHI 81.8/h, 25% central events; SpO2 low 76%  11/19/2023: OV with Dr. Villa Greaser. Difficulties falling asleep until early morning hours (3-5 am) and waking around 1 pm. Impacting quality of life. Despite getting 8 hr of sleep, tired. Loud breathing during sleep. Pain at night due to arthritis and bursitis. Recently switched from ozempic  to insulin for DM due to cost. Gets esophageal dilation periodically. Epworth 19/24. Suggest melatonin to anchor bedtime. Light exposure to help realign sleep study. Shift wake time gradually. HST ordered.  02/23/2024: Today - follow up Discussed the use of AI scribe software for clinical note transcription with the patient, who gave verbal consent to proceed.  History of Present Illness   Monique Warren "Cindi" is a 73 year old female with severe sleep apnea who presents with excessive daytime fatigue and sleep disturbances.  She experiences excessive daytime fatigue and sleep disturbances, characterized by difficulty falling asleep until 3 to 5 AM despite going to bed around 11 or 12 PM. Once asleep, she generally remains asleep unless disturbed by external factors, such as her dog's behavior. She does not recall dreaming and has no history of sleepwalking or sleep paralysis.   She has a history of using CPAP therapy but had issues tolerating it in the past. She takes gabapentin  at night for neuropathy. Doesn't seem to help with her sleep.  She's tried melatonin, which hasn't helped.   She has arthritis affecting her entire body and is awaiting a rheumatology appointment.   She does not drive due to drowsiness. Never had an accident but alertness is a concern.      Allergies  Allergen Reactions   Cefuroxime Anaphylaxis, Hives, Shortness Of Breath, Swelling and Anxiety   Codeine Hives, Shortness Of Breath, Nausea And Vomiting, Swelling and Anxiety   Morphine  Anaphylaxis   Sulfa Antibiotics Anaphylaxis, Hives, Nausea And Vomiting and Swelling    Swollen throat   Sulfamethoxazole-Trimethoprim Shortness Of Breath, Swelling and Anxiety    Throat swelling    Gluten Meal     Severe reflux - celiac disease    Immunization History  Administered Date(s) Administered   Fluad Quad(high Dose 65+) 07/02/2021, 06/05/2022   Fluad Trivalent(High Dose 65+) 06/16/2023   Influenza Split 07/26/2014   Influenza, High Dose Seasonal PF 09/22/2018   Influenza-Unspecified 07/26/2014   PFIZER(Purple Top)SARS-COV-2 Vaccination 12/01/2019, 12/29/2019   Pneumococcal Conjugate-13 08/02/2019   Pneumococcal Polysaccharide-23 07/28/2020   Tdap 09/30/2016   Zoster Recombinant(Shingrix) 06/03/2018, 11/10/2019    Past Medical History:  Diagnosis Date   Anxiety    Arthritis    Asthma    Celiac disease    Depression    Diabetes mellitus, type II (HCC)    Diabetic neuropathy (HCC)    Diastolic dysfunction    Grade 1 with preserved EF   Dysrhythmia    GERD (gastroesophageal reflux disease)    Heart murmur    Hiatal hernia    Sliding   Hyperlipidemia  Hypertension    Migraine    Obstructive sleep apnea    Osteoporosis    Vertigo     Tobacco History: Social History   Tobacco Use  Smoking Status Former   Current packs/day: 0.50   Types: Cigarettes  Smokeless Tobacco Never   Counseling given: Not Answered   Outpatient Medications Prior to Visit  Medication Sig Dispense Refill   acetaminophen  (TYLENOL ) 500 MG tablet Take 1  tablet (500 mg total) by mouth every 6 (six) hours as needed for moderate pain. 30 tablet 0   AIMOVIG  140 MG/ML SOAJ INJECT 1 PEN UNDER THE SKIN 1 TIME MONTHLY 1 mL 11   albuterol  (VENTOLIN  HFA) 108 (90 Base) MCG/ACT inhaler USE 2 INHALATIONS EVERY 6 HOURS AS NEEDED FOR WHEEZING OR SHORTNESS OF BREATH 8.5 g 10   atorvastatin  (LIPITOR) 40 MG tablet TAKE 1 TABLET AT BEDTIME 90 tablet 3   Blood Glucose Monitoring Suppl (ONE TOUCH ULTRA 2) w/Device KIT USE TO CHECK BLOOD GLUCOSE 4 TIMES A DAY 1 kit 0   Cholecalciferol  (VITAMIN D3) 125 MCG (5000 UT) capsule Take 5,000 Units by mouth daily.     diltiazem  (CARDIZEM  SR) 120 MG 12 hr capsule Take 2 capsules (240 mg total) by mouth daily. 180 capsule 3   DULoxetine  (CYMBALTA ) 60 MG capsule TAKE 1 CAPSULE DAILY 90 capsule 3   esomeprazole  (NEXIUM ) 40 MG capsule Take 1 capsule (40 mg total) by mouth 2 (two) times daily before a meal. 180 capsule 3   Estradiol  10 MCG TABS vaginal tablet Place 10 mcg vaginally every Monday.     famotidine  (PEPCID ) 40 MG tablet Take 1 tablet (40 mg total) by mouth 2 (two) times daily. 180 tablet 3   Fluticasone -Salmeterol,sensor, (AIRDUO DIGIHALER ) 232-14 MCG/ACT AEPB Inhale 1 Inhalation into the lungs 2 (two) times daily. USE 1 INHALATION TWICE A DAY Strength: 232-14 MCG/ACT 1 each 11   gabapentin  (NEURONTIN ) 300 MG capsule TAKE 1 CAPSULE AT BEDTIME 90 capsule 3   Lancets (ONETOUCH DELICA PLUS LANCET33G) MISC Use to check glucose once daily 100 each 6   meclizine  (ANTIVERT ) 25 MG tablet Take 1 tablet (25 mg total) by mouth 3 (three) times daily as needed for dizziness. 30 tablet 0   Multiple Vitamin (MULTIVITAMIN WITH MINERALS) TABS tablet Take 1 tablet by mouth daily.     ONETOUCH ULTRA test strip Use to monitor glucose once daily 100 each 6   OVER THE COUNTER MEDICATION Take 2 oz by mouth daily. Dose (herbal supplement), liver detox, taking for constipation     PROLIA  60 MG/ML SOSY injection INJECT 60 MG UNDER THE SKIN EVERY  6 MONTHS 1 mL 1   promethazine  (PHENERGAN ) 12.5 MG tablet Take 1 tablet (12.5 mg total) by mouth every 6 (six) hours as needed for nausea or vomiting. 30 tablet 0   Semaglutide , 2 MG/DOSE, 8 MG/3ML SOPN Inject 2 mg as directed once a week. 9 mL 3   traMADol  (ULTRAM ) 50 MG tablet TAKE 1 TABLET BY MOUTH EVERY 12 HOURS AS NEEDED 20 tablet 0   triamcinolone  cream (KENALOG ) 0.1 % Apply 1 Application topically 2 (two) times daily. 30 g 0   venlafaxine  (EFFEXOR ) 37.5 MG tablet TAKE 1 TABLET TWICE A DAY 60 tablet 3   zolmitriptan (ZOMIG) 5 MG tablet Take 5 mg by mouth daily as needed for migraine.     insulin glargine  (LANTUS  SOLOSTAR) 100 UNIT/ML Solostar Pen Inject 15 Units into the skin at bedtime. 15 mL 3  Insulin Pen Needle (PEN NEEDLES) 31G X 8 MM MISC Use to inject insulin once daily 100 each 3   Facility-Administered Medications Prior to Visit  Medication Dose Route Frequency Provider Last Rate Last Admin   bupivacaine -meloxicam  ER (ZYNRELEF ) injection 400 mg  400 mg Infiltration Once Darrin Emerald, MD       bupivacaine -meloxicam  ER (ZYNRELEF ) injection 400 mg  400 mg Infiltration Once Darrin Emerald, MD         Review of Systems:   Constitutional: No weight loss or gain, night sweats, fevers, chills,  or lassitude. +fatigue  HEENT: No headaches, difficulty swallowing, tooth/dental problems, or sore throat. No sneezing, itching, ear ache, nasal congestion, or post nasal drip CV:  No chest pain, orthopnea, PND, swelling in lower extremities, anasarca, dizziness, palpitations, syncope Resp: +snoring, witnessed apneas. No shortness of breath with exertion or at rest. No cough GI:  No heartburn, indigestion GU: No nocturia  Skin: No rash, lesions, ulcerations MSK:  +chronic arthralgias/myalgias  Neuro: No dizziness or lightheadedness.  Psych: No depression or anxiety. Mood stable. +sleep disturbance     Physical Exam:  BP 102/64   Pulse 80   Ht 5\' 2"  (1.575 m)   Wt 185 lb  (83.9 kg)   SpO2 96%   BMI 33.84 kg/m   GEN: Pleasant, interactive, well-appearing; obese; in no acute distress HEENT:  Normocephalic and atraumatic. PERRLA. Sclera white. Nasal turbinates pink, moist and patent bilaterally. No rhinorrhea present. Oropharynx pink and moist, without exudate or edema. No lesions, ulcerations, or postnasal drip. Mallampati III NECK:  Supple w/ fair ROM. Thyroid symmetrical with no goiter or nodules palpated. No lymphadenopathy.   CV: RRR, no m/r/g, no peripheral edema. Pulses intact, +2 bilaterally. No cyanosis, pallor or clubbing. PULMONARY:  Unlabored, regular breathing. Clear bilaterally A&P w/o wheezes/rales/rhonchi. No accessory muscle use.  GI: BS present and normoactive. Soft, non-tender to palpation. No organomegaly or masses detected.  MSK: No erythema, warmth or tenderness. Cap refil <2 sec all extrem. No deformities or joint swelling noted.  Neuro: A/Ox3. No focal deficits noted.   Skin: Warm, no lesions or rashe Psych: Normal affect and behavior. Judgement and thought content appropriate.     Lab Results:  CBC    Component Value Date/Time   WBC 7.4 06/16/2023 1129   WBC 15.2 (H) 12/08/2022 0400   RBC 4.72 06/16/2023 1129   RBC 3.95 12/08/2022 0400   HGB 14.8 06/16/2023 1129   HCT 44.2 06/16/2023 1129   PLT 328 06/16/2023 1129   MCV 94 06/16/2023 1129   MCH 31.4 06/16/2023 1129   MCH 32.4 12/08/2022 0400   MCHC 33.5 06/16/2023 1129   MCHC 33.0 12/08/2022 0400   RDW 13.0 06/16/2023 1129   LYMPHSABS 1.7 06/16/2023 1129   MONOABS 0.7 11/29/2022 1348   EOSABS 0.0 06/16/2023 1129   BASOSABS 0.0 06/16/2023 1129    BMET    Component Value Date/Time   NA 142 06/16/2023 1129   K 4.6 06/16/2023 1129   CL 106 06/16/2023 1129   CO2 22 06/16/2023 1129   GLUCOSE 102 (H) 06/16/2023 1129   GLUCOSE 181 (H) 12/07/2022 0557   BUN 14 06/16/2023 1129   CREATININE 0.87 06/16/2023 1129   CALCIUM  9.0 06/16/2023 1129   GFRNONAA >60 12/07/2022  0557    BNP No results found for: "BNP"   Imaging:  No results found.  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 Prior issues with tolerating CPAP. Given severity, central events, and history, recommend urgent in lab CPAP titration study. May need Bilevel support for adequate control. Reviewed risks of untreated severe OSA and treatment options. Shared decision to start PAP given severity. Would not be a candidate for hypoglossal nerve stimulator due to percentage of central events. Advised to continue refraining from driving until symptoms are resolved. Healthy weight management encouraged.  Patient Instructions  Your sleep study showed very severe sleep apnea. I have ordered an urgent in lab titration study to ensure you do well on CPAP or see if you need a BiPAP machine.   We discussed how untreated sleep apnea puts an individual at risk for cardiac arrhthymias, pulm HTN, DM, stroke and increases their risk for daytime accidents. We also briefly reviewed treatment options including weight loss, side sleeping position, oral appliance, CPAP therapy or referral to ENT for possible surgical options  Start ramelteon 8 mg At bedtime as needed for sleep. Take immediately before bed. Ensure you have 7-8 hours in the bed after taking. Monitor for any mood changes or changes in sleep habits. Stop and notify immediately if these occur. Do not drive or operate heavy machinery after taking. Do not take with alcohol or other sedating medications. May cause some morning grogginess or vivid dreams.   Follow up in 10-12 weeks to see how PAP therapy and sleep aid is working with Dr. Villa Greaser or Gina Lagos. If symptoms do not improve or worsen, please contact office for sooner follow up or seek emergency care.     Insomnia Primary issue is sleep latency. Likely severe sleep apnea is contributing. No perceived benefit with melatonin. Encouraged to continue focusing on sleep hygiene. Concerned with ability to sleep during sleep study. Will start her on sleep aid with zaleplon. Trazodone not appropriate given risk for serotonin syndrome with current medications. Ramelteon not covered by insurance. Zaleplon good option given short half life. Advised on side effect profile.   Obesity BMI 33. Healthy weight management encouraged   Advised if symptoms do not improve or worsen, to please contact office for sooner follow up or seek emergency care.   I spent 35 minutes of dedicated to the care of this patient on the date of this encounter to include pre-visit review of records, face-to-face time with the patient discussing conditions above, post visit ordering of testing, clinical documentation with the electronic health record, making appropriate referrals as documented, and communicating necessary findings to members of the patients care team.  Roetta Clarke, NP 02/23/2024  Pt aware and understands NP's role.

## 2024-02-23 NOTE — Assessment & Plan Note (Signed)
 Primary issue is sleep latency. Likely severe sleep apnea is contributing. No perceived benefit with melatonin. Encouraged to continue focusing on sleep hygiene. Concerned with ability to sleep during sleep study. Will start her on sleep aid with zaleplon. Trazodone not appropriate given risk for serotonin syndrome with current medications. Ramelteon not covered by insurance. Zaleplon good option given short half life. Advised on side effect profile.

## 2024-02-25 ENCOUNTER — Ambulatory Visit (HOSPITAL_BASED_OUTPATIENT_CLINIC_OR_DEPARTMENT_OTHER): Attending: Nurse Practitioner | Admitting: Internal Medicine

## 2024-02-25 DIAGNOSIS — G4739 Other sleep apnea: Secondary | ICD-10-CM | POA: Insufficient documentation

## 2024-02-28 DIAGNOSIS — G4739 Other sleep apnea: Secondary | ICD-10-CM | POA: Diagnosis not present

## 2024-02-28 NOTE — Procedures (Signed)
 [facility] [address] [place], [place] [address] Tel: [phone]   Fax: [phone]  Titration Interpretation  Patient Name:  Monique, Warren Date:  02/25/2024 Referring Physician:  Shan Dare, MD  Indications for Polysomnography The patient is a 73 year old Female who is 5' 1 and weighs 180.0 lbs. Her BMI equals 33.5.  A full night titration treatment study was performed.  Baseline HST (SNAP) 12/04/23- AHI (4%) 81.8/hr (24% centrals), desaturation to 76%, body weight 190 lbs.  Medication was administered at 2045.  Zaleplon    Polysomnogram Data A full night polysomnogram recorded the standard physiologic parameters including EEG, EOG, EMG, EKG, nasal and oral airflow.  Respiratory parameters of chest and abdominal movements were recorded with Respiratory Inductance Plethysmography belts.  Oxygen saturation was recorded by pulse oximetry.   Sleep Architecture The total recording time of the polysomnogram was 411.6 minutes.  The total sleep time was 187.0 minutes.  The patient spent 4.3% of total sleep time in Stage N1, 95.7% in Stage N2, 0.0% in Stages N3, and 0.0% in REM.  Sleep latency was 56.3 minutes.  REM latency was - minutes.  Sleep Efficiency was 45.4%.  Wake after Sleep Onset time was 168.5 minutes.  Titration Summary The patient was titrated at pressures ranging from 6* cm/H20 with supplemental oxygen at - up to 12* cm/H20 with supplemental oxygen at -.  The last pressure used in the study was 12* cm/H20 with supplemental oxygen at -.  Respiratory Events The polysomnogram revealed a presence of 5 obstructive, 2 centrals, and - mixed apneas resulting in an Apnea index of 2.2 events per hour.  There were 56 hypopneas (>=3% desaturation and/or arousal) resulting in an Apnea\Hypopnea Index (AHI >=3% desaturation and/or arousal) of 20.2 events per hour.  There were 15 hypopneas (>=4% desaturation) resulting in an  Apnea\Hypopnea Index (AHI >=4% desaturation) of 7.1 events per hour.  There were 1 Respiratory Effort Related Arousals resulting in a RERA index of 0.3 events per hour. The Respiratory Disturbance Index is 20.5 events per hour.  The snore index was - events per hour.  Mean oxygen saturation was 93.0%.  The lowest oxygen saturation during sleep was 89.0%.  Time spent <=88% oxygen saturation was - minutes (-).  Limb Activity There were 126 limb movements recorded.  Of this total, 109 were classified as PLMs.  Of the PLMs, 27 were associated with arousals.  The Limb Movement index was 40.4 per hour while the PLM index was 35.0 per hour.  Cardiac Summary The average pulse rate was 81.5 bpm.  The minimum pulse rate was 72.0 bpm while the maximum pulse rate was 93.0 bpm.  Cardiac rhythm was normal.  Comment: CPAP titrated to 12 cwp, residual AHI (4%) 6.2/hr (obstructive and centrals), minimum O2 saturation 90%. Tech noted patient difficulty initiating and maintaining sleep.  Diagnosis: Complex sleep apnea  Recommendations: Suggest autopap 5-15.   This study was personally reviewed and electronically signed by: [employer], MD Accredited Board Certified in Sleep Medicine Date/Time: 02/28/24   1:51   Titration Report  Patient Name: Monique, Warren Date: 02/25/2024  Date of Birth: 1951-03-04 Study Type: CPAP Titration  Age: 53 year MRN #: [id number]  Sex: Female Interpreting Physician: [employer], [id number]  Height: 5' 1 Referring Physician: Shan Dare, MD  Weight: 180.0 lbs Recording Tech: Odella Bending RPSGT RST  BMI: 33.5 Scoring Tech: Odella Bending RPSGT RST  ESS: 18 Neck Size: 16  Mask Type Evora  Full Final Pressure: 12cm H2O  Mask Size: Small-Medium Supplemental O2: N/A   Study Overview  Lights Off: 10:43:24 PM  Count Index  Lights On: 05:35:00 AM Awakenings: 8 2.6  Time in Bed: 411.6 min. Arousals: 80 25.7  Total Sleep Time: 187.0 min. AHI (>=3% Desat and/or Ar.): 63  20.2   Sleep Efficiency: 45.4% AHI (>=4% Desat): 22 7.1   Sleep Latency: 56.3 min. Limb Movements: 126 40.4  Wake After Sleep Onset: 168.5 min. Snore: - -  REM Latency from Sleep Onset: - min. Desaturations: 64 20.5     Minimum SpO2 TST: 89.0%    Sleep Architecture  % of Time in Bed Stages Time (mins) % Sleep Time  Wake 225.0   Stage N1 8.0 4.3%  Stage N2 179.0 95.7%  Stage N3 0.0 0.0%  REM 0.0 0.0%   Arousal Summary   NREM REM Sleep Index  Respiratory Arousals 10 - 10 3.2  PLM Arousals 27 - 27 8.7  Isolated Limb Movement Arousals 4 - 4 1.3  Snore Arousals - - - -  Spontaneous Arousals 39 - 39 12.5  Total 80 - 80 25.7   Limb Movement Summary   Count Index  Isolated Limb Movements 17 5.5  Periodic Limb Movements (PLMs) 109 35.0  Total Limb Movements 126 40.4    Respiratory Summary   By Sleep Stage By Body Position Total   NREM REM Supine Non-Supine   Time (min) 187.0 0.0 107.5 79.5 187.0         Obstructive Apnea 5 - 5 - 5  Mixed Apnea - - - - -  Central Apnea 2 - 2 - 2  Total Apneas 7 - 7 - 7  Total Apnea Index 2.2 - 3.9 - 2.2         Hypopneas (>=3% Desat and/or Ar.) 56 - 22 34 56  AHI (>=3% Desat and/or Ar.) 20.2 - 16.2 25.7 20.2         Hypopneas (>=4% Desat) 15 - 4 11 15   AHI (>=4% Desat) 7.1 - 6.1 8.3 7.1          RERAs 1 - 1 - 1  RERA Index 0.3 - 0.6 - 0.3         RDI 20.5 - 16.7 25.7 20.5     Respiratory Event Durations   Apnea Hypopnea   NREM REM NREM REM  Average (seconds) 17.6 - 19.6 -  Maximum (seconds) 26.7 - 34.5 -    Oxygen Saturation Summary   Wake NREM REM TST TIB  Average SpO2 93.3% 92.8% - 92.8% 93.0%  Minimum SpO2 90.0% 89.0% - 89.0% 89.0%  Maximum SpO2 97.0% 96.0% - 96.0% 97.0%   Oxygen Saturation Distribution  Range (%) Time in range (min) Time in range (%)  90.0 - 100.0 402.6 99.2%  80.0 - 90.0 3.4 0.8%  70.0 - 80.0 - -  60.0 - 70.0 - -  50.0 - 60.0 - -  0.0 - 50.0 - -  Time Spent <=88% SpO2  Range (%) Time in  range (min) Time in range (%)  0.0 - 88.0 - -      Count Index  Desaturations 64 20.5    Cardiac Summary   Wake NREM REM Sleep Total  Average Pulse Rate (BPM) 80.2 83.0 - 83.0 81.5  Minimum Pulse Rate (BPM) 72.0 76.0 - 76.0 72.0  Maximum Pulse Rate (BPM) 92.0 93.0 - 93.0 93.0   Pulse Rate Distribution:  Range (bpm) Time in range (  min) Time in range (%)  0.0 - 40.0 - -  40.0 - 60.0 - -  60.0 - 80.0 132.8 32.7%  80.0 - 100.0 272.6 67.2%  100.0 - 120.0 - -  120.0 - 140.0 - -  140.0 - 200.0 - -   Titration Summary  PAP Device PAP Level O2 Level Time (min) Wake (min) NREM (min) REM (min) Sleep Eff% OA# CA# MA# Hyp# (>=3%) AHI (>=3%) Hyp# (>=4%) AHI (>=%4) RERA RDI SpO2 <=88% (min) Min SpO2 Mean SpO2 Ar. Index  CPAP 6 - 79.0 66.0 13.0 0.0 16.5% - - - 8 36.9 3  13.8 -  36.9  0.0 89.0 91.8 36.9  CPAP 7 - 35.5 0.0 35.5 0.0 100.0% - - - 12 20.3 4  6.8 -  20.3  0.0 89.0 92.0 23.7  CPAP 8 - 24.0 1.5 22.5 0.0 93.8% - - - 13 34.7 4  10.7 -  34.7  0.0 90.0 92.6 32.0  CPAP 10 - 186.0 157.5 28.5 0.0 15.3% 3 - - 6 18.9 -  6.3 -  18.9  0.0 90.0 92.9 35.8  CPAP 11 - 29.0 0.0 29.0 0.0 100.0% - - - 4 8.3 2  4.1 -  8.3  0.0 91.0 93.1 12.4  CPAP 12 - 58.5 0.0 58.5 0.0 100.0% 2 2 - 13 17.4 2  6.2 1  18.5  0.0 90.0 93.3 23.6    Hypnograms                           Technologist Comments  Patient is a 73 year old female who was sent to the sleep center for complex sleep apnea.  The patient was fitted with a [employer] Evora full size small-medium. Patient was placed on CPAP of 6cmhH2O and was increased to a CPAP pressure of 12 cmH2O with heated humidification. Patient was increased for respiratory events with and without a 4%  desat or arousal, and audible snoring.  Patient tolerated CPAP trial well. No oxygen was applied. The patient took her bedtime medications at 8045. PLM's/PLMA's were noted. No Questionable cardiac arrhythmias were noted. One restroom visit was  noted. Sweat sway was noted. The patient had a hard time initiating sleep in the beginning and the middle of the study.                           [employer] Diplomate, Biomedical engineer of Sleep Medicine  ELECTRONICALLY SIGNED ON:  02/28/2024, 1:46 PM [facility] PH: [phone]   FX: [phone] ACCREDITED BY THE AMERICAN ACADEMY OF SLEEP MEDICINE

## 2024-03-01 ENCOUNTER — Encounter: Payer: Self-pay | Admitting: Internal Medicine

## 2024-03-01 ENCOUNTER — Ambulatory Visit (INDEPENDENT_AMBULATORY_CARE_PROVIDER_SITE_OTHER): Admitting: Internal Medicine

## 2024-03-01 ENCOUNTER — Telehealth: Payer: Self-pay | Admitting: Nurse Practitioner

## 2024-03-01 VITALS — BP 122/78 | HR 121 | Ht 62.0 in | Wt 194.6 lb

## 2024-03-01 DIAGNOSIS — E1140 Type 2 diabetes mellitus with diabetic neuropathy, unspecified: Secondary | ICD-10-CM

## 2024-03-01 DIAGNOSIS — M810 Age-related osteoporosis without current pathological fracture: Secondary | ICD-10-CM

## 2024-03-01 DIAGNOSIS — Z7985 Long-term (current) use of injectable non-insulin antidiabetic drugs: Secondary | ICD-10-CM

## 2024-03-01 DIAGNOSIS — E559 Vitamin D deficiency, unspecified: Secondary | ICD-10-CM

## 2024-03-01 DIAGNOSIS — I739 Peripheral vascular disease, unspecified: Secondary | ICD-10-CM | POA: Diagnosis not present

## 2024-03-01 DIAGNOSIS — I10 Essential (primary) hypertension: Secondary | ICD-10-CM | POA: Diagnosis not present

## 2024-03-01 DIAGNOSIS — K5904 Chronic idiopathic constipation: Secondary | ICD-10-CM

## 2024-03-01 DIAGNOSIS — E782 Mixed hyperlipidemia: Secondary | ICD-10-CM | POA: Diagnosis not present

## 2024-03-01 DIAGNOSIS — G4739 Other sleep apnea: Secondary | ICD-10-CM

## 2024-03-01 DIAGNOSIS — F331 Major depressive disorder, recurrent, moderate: Secondary | ICD-10-CM | POA: Diagnosis not present

## 2024-03-01 MED ORDER — LINACLOTIDE 72 MCG PO CAPS: 72.0000 ug | ORAL_CAPSULE | Freq: Every day | ORAL | 1 refills | Status: DC

## 2024-03-01 MED ORDER — VENLAFAXINE HCL 75 MG PO TABS: 75.0000 mg | ORAL_TABLET | Freq: Two times a day (BID) | ORAL | 1 refills | Status: DC

## 2024-03-01 MED ORDER — PROLIA 60 MG/ML ~~LOC~~ SOSY: PREFILLED_SYRINGE | SUBCUTANEOUS | 1 refills | Status: AC

## 2024-03-01 MED ORDER — PROLIA 60 MG/ML ~~LOC~~ SOSY: PREFILLED_SYRINGE | SUBCUTANEOUS | 1 refills | Status: DC

## 2024-03-01 NOTE — Progress Notes (Unsigned)
 Established Patient Office Visit  Subjective:  Patient ID: Monique Warren, female    DOB: 06/23/1951  Age: 73 y.o. MRN: [id number]  CC:  Chief Complaint  Patient presents with   Medical Management of Chronic Issues    6 month f/u    HPI Monique Warren is a 72 y.o. female with past medical history of hypertension, cerebral artery aneurysm s/p clip, migraine, asthma, OSA, celiac disease, GERD, type II DM, osteoporosis, depression with anxiety and obesity who presents for f/u of her chronic medical conditions.  HTN: BP is wnl today. Takes diltiazem  240 mg QD regularly. Patient denies headache, chest pain, dyspnea or palpitations.  Her lisinopril  has been stopped due to hypotensive episodes.  Type II DM: Her HbA1C was 6.6 in 12/24. Her Ozempic  dose was discontinued by endocrinology due to cost concern.  She is currently taking Lantus  25 units daily.  Her blood glucose has been elevated, averages around 190 on CGM. She denies any polyuria or polydipsia.  MDD: She takes Cymbalta  60 mg daily.  She was placed on Effexor  instead of Wellbutrin .  She has been tolerating it better.  She reports improvement in her apathy and concentration with it. Denies any SI or HI currently.  Migraine: Takes Aimovig  and feels better now. Takes zolmitriptan as needed for breakthrough headache.  Cerebral artery aneurysm status post clip placement: She has not seen Neurosurgeon since moving to [place]. Her last MRI was around 5 years ago.  She complains of nasal congestion, postnasal drip and sinus pressure related headache for the last 1 week.  She has tried Robitussin without much relief.  Denies any fever, chills, dyspnea or wheezing currently.  She also complains of chronic left shoulder pain.  She has seen Dr. Phyllis Breeze for it. She has had subacromial steroid injection for bursitis, which did not provide relief for more than a week.  Past Medical History:  Diagnosis Date   Anxiety    Arthritis    Asthma     Celiac disease    Depression    Diabetes mellitus, type II (HCC)    Diabetic neuropathy (HCC)    Diastolic dysfunction    Grade 1 with preserved EF   Dysrhythmia    GERD (gastroesophageal reflux disease)    Heart murmur    Hiatal hernia    Sliding   Hyperlipidemia    Hypertension    Migraine    Obstructive sleep apnea    Osteoporosis    Vertigo     Past Surgical History:  Procedure Laterality Date   ABDOMINAL HYSTERECTOMY  09/29/2003   APPENDECTOMY     BALLOON DILATION N/A 02/08/2022   Procedure: BALLOON DILATION;  Surgeon: Vinetta Greening, DO;  [facility];  Service: Endoscopy;  Laterality: N/A;   BIOPSY  02/08/2022   Procedure: BIOPSY;  Surgeon: Vinetta Greening, DO;  [facility];  Service: Endoscopy;;   BIOPSY  07/11/2023   Procedure: BIOPSY;  Surgeon: Vinetta Greening, DO;  [facility];  Service: Endoscopy;;   BREAST BIOPSY Left    CARPAL TUNNEL RELEASE     CATARACT EXTRACTION Bilateral    October and November 2016   COLONOSCOPY  09/2017   Marijo Shove, MD in [place]; 5 mm tubular adenoma in the descending colon s/p resected, mild sigmoid diverticulosis, internal hemorrhoids.  Recommended repeat colonoscopy in 5 years.   COLONOSCOPY WITH PROPOFOL  N/A 07/11/2023   Surgeon: Vinetta Greening, DO; Nonbleeding internal hemorrhoids,  diverticulosis in the sigmoid colon, 5 mm polyp in the transverse colon resected and retrieved, 8 mm polyp in the sigmoid colon resected and retrieved, single nonbleeding angiodysplastic lesion.  Pathology with 1 tubular adenoma and 1 hyperplastic polyp.  Recommended 7-year surveillance.   CRANIOTOMY FOR ANEURYSM / VERTEBROBASILAR / CAROTID CIRCULATION Right 10/24/2015   ESOPHAGOGASTRODUODENOSCOPY  09/2017   [name] ; irregular Z-line s/p biopsy, decreased LES tone, 4 cm hiatal hernia, erythema in gastric antrum biopsied, normal examined duodenum biopsied.  Esophageal biopsy suggestive of reflux, no Barrett's,  gastric biopsy with nonspecific chronic gastritis with intestinal metaplasia, duodenal biopsy with increased intraepithelial lymphocytes without blunting of villi, nonspecific.   ESOPHAGOGASTRODUODENOSCOPY (EGD) WITH PROPOFOL  N/A 02/08/2022   Surgeon: Vinetta Greening, DO; 2 cm hiatal hernia, short segment Barrett's esophagus without dysplasia, mild Schatzki's ring dilated, gastritis with biopsies benign, normal examined duodenum.  Repeat EGD in 5 years.   ESOPHAGOGASTRODUODENOSCOPY (EGD) WITH PROPOFOL  N/A 07/11/2023   Surgeon: Vinetta Greening, DO;  cm hiatal hernia, mucosal changes secondary to short segment Barrett's esophagus biopsied, esophageal mucosal changes suspicious for EOE biopsied and dilated, gastritis biopsied. Gastric biopsies benign, all esophageal biopsies benign.   KNEE ARTHROSCOPY WITH MEDIAL MENISECTOMY Left 04/09/2022   Procedure: KNEE ARTHROSCOPY WITH PARTIAL MEDIAL MENISCECTOMY, LATERAL MENISCAL DEBRIDEMENT;  Surgeon: Darrin Emerald, MD;  Location: [facility];  Service: Orthopedics;  Laterality: Left;   KNEE ARTHROSCOPY WITH MENISCAL REPAIR Left 04/09/2022   Procedure: KNEE ARTHROSCOPY WITH MEDIAL MENISCAL REPAIR;  Surgeon: Darrin Emerald, MD;  Location: [facility];  Service: Orthopedics;  Laterality: Left;   LAPAROSCOPY     POLYPECTOMY  07/11/2023   Procedure: POLYPECTOMY;  Surgeon: Vinetta Greening, DO;  [facility];  Service: Endoscopy;;   TOTAL KNEE ARTHROPLASTY Right 12/06/2022   Procedure: TOTAL KNEE ARTHROPLASTY;  Surgeon: Darrin Emerald, MD;  Location: [facility];  Service: Orthopedics;  Laterality: Right;   UTERINE FIBROID SURGERY     WRIST SURGERY Bilateral     Family History  Problem Relation Age of Onset   Breast cancer Mother    Thyroid disease Mother    Stroke Mother    Hypertension Father    Hyperlipidemia Father    Heart attack Father    Heart failure Father    Breast cancer Paternal Grandmother    Cancer - Colon Neg Hx    Gastric  cancer Neg Hx    Esophageal cancer Neg Hx    Liver cancer Neg Hx    Autoimmune disease Neg Hx     Social History   Socioeconomic History   Marital status: Married    Spouse name: Not on file   Number of children: Not on file   Years of education: Not on file   Highest education level: Associate degree: occupational, Scientist, product/process development, or vocational program  Occupational History   Not on file  Tobacco Use   Smoking status: Former    Current packs/day: 0.50    Types: Cigarettes   Smokeless tobacco: Never  Vaping Use   Vaping status: Never Used  Substance and Sexual Activity   Alcohol use: Yes    Alcohol/week: 4.0 standard drinks of alcohol    Types: 2 Glasses of wine, 2 Shots of liquor per week    Comment: rarely   Drug use: Never   Sexual activity: Not on file  Other Topics Concern   Not on file  Social History Narrative   Not on file   Social Drivers of Health  Financial Resource Strain: Low Risk  (02/27/2024)   Overall Financial Resource Strain (CARDIA)    Difficulty of Paying Living Expenses: Not hard at all  Food Insecurity: No Food Insecurity (02/27/2024)   Hunger Vital Sign    Worried About Running Out of Food in the Last Year: Never true    Ran Out of Food in the Last Year: Never true  Transportation Needs: No Transportation Needs (02/27/2024)   PRAPARE - Administrator, Civil Service (Medical): No    Lack of Transportation (Non-Medical): No  Physical Activity: Insufficiently Active (02/27/2024)   Exercise Vital Sign    Days of Exercise per Week: 1 day    Minutes of Exercise per Session: 30 min  Stress: Stress Concern Present (02/27/2024)   [employer] of Occupational Health - Occupational Stress Questionnaire    Feeling of Stress: Rather much  Social Connections: Moderately Integrated (02/27/2024)   Social Connection and Isolation Panel    Frequency of Communication with Friends and Family: More than three times a week    Frequency of Social  Gatherings with Friends and Family: More than three times a week    Attends Religious Services: 1 to 4 times per year    Active Member of [employer] or Organizations: No    Attends Banker Meetings: Not on file    Marital Status: Married  Catering manager Violence: Not At Risk (12/06/2022)   Humiliation, Afraid, Rape, and Kick questionnaire    Fear of Current or Ex-Partner: No    Emotionally Abused: No    Physically Abused: No    Sexually Abused: No    Outpatient Medications Prior to Visit  Medication Sig Dispense Refill   acetaminophen  (TYLENOL ) 500 MG tablet Take 1 tablet (500 mg total) by mouth every 6 (six) hours as needed for moderate pain. 30 tablet 0   AIMOVIG  140 MG/ML SOAJ INJECT 1 PEN UNDER THE SKIN 1 TIME MONTHLY 1 mL 11   albuterol  (VENTOLIN  HFA) 108 (90 Base) MCG/ACT inhaler USE 2 INHALATIONS EVERY 6 HOURS AS NEEDED FOR WHEEZING OR SHORTNESS OF BREATH 8.5 g 10   atorvastatin  (LIPITOR) 40 MG tablet TAKE 1 TABLET AT BEDTIME 90 tablet 3   Blood Glucose Monitoring Suppl (ONE TOUCH ULTRA 2) w/Device KIT USE TO CHECK BLOOD GLUCOSE 4 TIMES A DAY 1 kit 0   Cholecalciferol  (VITAMIN D3) 125 MCG (5000 UT) capsule Take 5,000 Units by mouth daily.     diltiazem  (CARDIZEM  SR) 120 MG 12 hr capsule Take 2 capsules (240 mg total) by mouth daily. 180 capsule 3   DULoxetine  (CYMBALTA ) 60 MG capsule TAKE 1 CAPSULE DAILY 90 capsule 3   esomeprazole  (NEXIUM ) 40 MG capsule Take 1 capsule (40 mg total) by mouth 2 (two) times daily before a meal. 180 capsule 3   Estradiol  10 MCG TABS vaginal tablet Place 10 mcg vaginally every Monday.     famotidine  (PEPCID ) 40 MG tablet Take 1 tablet (40 mg total) by mouth 2 (two) times daily. 180 tablet 3   Fluticasone -Salmeterol,sensor, (AIRDUO DIGIHALER ) 232-14 MCG/ACT AEPB Inhale 1 Inhalation into the lungs 2 (two) times daily. USE 1 INHALATION TWICE A DAY Strength: 232-14 MCG/ACT 1 each 11   gabapentin  (NEURONTIN ) 300 MG capsule TAKE 1 CAPSULE AT BEDTIME  90 capsule 3   Lancets (ONETOUCH DELICA PLUS LANCET33G) MISC Use to check glucose once daily 100 each 6   meclizine  (ANTIVERT ) 25 MG tablet Take 1 tablet (25 mg total) by mouth 3 (three)  times daily as needed for dizziness. 30 tablet 0   Multiple Vitamin (MULTIVITAMIN WITH MINERALS) TABS tablet Take 1 tablet by mouth daily.     ONETOUCH ULTRA test strip Use to monitor glucose once daily 100 each 6   OVER THE COUNTER MEDICATION Take 2 oz by mouth daily. Dose (herbal supplement), liver detox, taking for constipation     PROLIA  60 MG/ML SOSY injection INJECT 60 MG UNDER THE SKIN EVERY 6 MONTHS 1 mL 1   promethazine  (PHENERGAN ) 12.5 MG tablet Take 1 tablet (12.5 mg total) by mouth every 6 (six) hours as needed for nausea or vomiting. 30 tablet 0   Semaglutide , 2 MG/DOSE, 8 MG/3ML SOPN Inject 2 mg as directed once a week. 9 mL 3   Semaglutide , 2 MG/DOSE, 8 MG/3ML SOPN Inject 2 mg into the skin once a week.     traMADol  (ULTRAM ) 50 MG tablet TAKE 1 TABLET BY MOUTH EVERY 12 HOURS AS NEEDED 20 tablet 0   triamcinolone  cream (KENALOG ) 0.1 % Apply 1 Application topically 2 (two) times daily. 30 g 0   venlafaxine  (EFFEXOR ) 37.5 MG tablet TAKE 1 TABLET TWICE A DAY 60 tablet 3   zaleplon  (SONATA ) 5 MG capsule Take 1 capsule (5 mg total) by mouth at bedtime as needed for sleep. 30 capsule 0   zolmitriptan (ZOMIG) 5 MG tablet Take 5 mg by mouth daily as needed for migraine.     insulin glargine  (LANTUS  SOLOSTAR) 100 UNIT/ML Solostar Pen Inject 15 Units into the skin at bedtime. 15 mL 3   Insulin Pen Needle (PEN NEEDLES) 31G X 8 MM MISC Use to inject insulin once daily 100 each 3   Facility-Administered Medications Prior to Visit  Medication Dose Route Frequency Provider Last Rate Last Admin   bupivacaine -meloxicam  ER (ZYNRELEF ) injection 400 mg  400 mg Infiltration Once Darrin Emerald, MD       bupivacaine -meloxicam  ER (ZYNRELEF ) injection 400 mg  400 mg Infiltration Once Darrin Emerald, MD         Allergies  Allergen Reactions   Cefuroxime Anaphylaxis, Hives, Shortness Of Breath, Swelling and Anxiety   Codeine Hives, Shortness Of Breath, Nausea And Vomiting, Swelling and Anxiety   Morphine  Anaphylaxis   Sulfa Antibiotics Anaphylaxis, Hives, Nausea And Vomiting and Swelling    Swollen throat   Sulfamethoxazole-Trimethoprim Shortness Of Breath, Swelling and Anxiety    Throat swelling    Gluten Meal     Severe reflux - celiac disease    ROS Review of Systems  Constitutional:  Positive for fatigue. Negative for chills and fever.  HENT:  Positive for congestion, postnasal drip, sinus pressure and sinus pain. Negative for sore throat.   Eyes:  Positive for visual disturbance. Negative for pain and discharge.  Respiratory:  Positive for cough. Negative for shortness of breath.   Cardiovascular:  Negative for chest pain and palpitations.  Gastrointestinal:  Positive for constipation. Negative for abdominal pain, diarrhea, nausea and vomiting.  Endocrine: Negative for polydipsia and polyuria.  Genitourinary:  Negative for dysuria and hematuria.  Musculoskeletal:  Positive for arthralgias (Left knee, left shoulder, elbow and hand), back pain and neck pain. Negative for neck stiffness.  Skin:  Negative for rash.  Neurological:  Positive for dizziness and weakness (Right hand).  Psychiatric/Behavioral:  Positive for sleep disturbance. Negative for agitation and behavioral problems.       Objective:    Physical Exam Vitals reviewed.  Constitutional:      General: She is not  in acute distress.    Appearance: She is obese. She is not diaphoretic.  HENT:     Head: Normocephalic and atraumatic.     Nose: Congestion present.     Right Sinus: Maxillary sinus tenderness present.     Left Sinus: Maxillary sinus tenderness present.     Mouth/Throat:     Mouth: Mucous membranes are moist.   Eyes:     General: No scleral icterus.    Extraocular Movements: Extraocular movements  intact.    Cardiovascular:     Rate and Rhythm: Normal rate and regular rhythm.     Pulses: Normal pulses.     Heart sounds: Murmur (Systolic over right upper sternal border) heard.  Pulmonary:     Breath sounds: Normal breath sounds. No wheezing or rales.   Musculoskeletal:     Left shoulder: Tenderness present. No swelling. Decreased range of motion.     Cervical back: Neck supple. No tenderness.     Right lower leg: No edema.     Left lower leg: No edema.     Comments: S/p right TKA, incision C/D/I   Skin:    General: Skin is warm.     Findings: No rash.   Neurological:     General: No focal deficit present.     Mental Status: She is alert and oriented to person, place, and time.     Sensory: No sensory deficit.     Motor: No weakness.   Psychiatric:        Mood and Affect: Mood normal.        Behavior: Behavior normal.     BP 122/78   Pulse (!) 121   Ht 5' 2 (1.575 m)   Wt 194 lb 9.6 oz (88.3 kg)   SpO2 96%   BMI 35.59 kg/m  Wt Readings from Last 3 Encounters:  03/01/24 194 lb 9.6 oz (88.3 kg)  02/25/24 180 lb (81.6 kg)  02/23/24 185 lb (83.9 kg)    Lab Results  Component Value Date   TSH 1.520 06/16/2023   Lab Results  Component Value Date   WBC 7.4 06/16/2023   HGB 14.8 06/16/2023   HCT 44.2 06/16/2023   MCV 94 06/16/2023   PLT 328 06/16/2023   Lab Results  Component Value Date   NA 142 06/16/2023   K 4.6 06/16/2023   CO2 22 06/16/2023   GLUCOSE 102 (H) 06/16/2023   BUN 14 06/16/2023   CREATININE 0.87 06/16/2023   BILITOT 0.3 06/16/2023   ALKPHOS 94 06/16/2023   AST 19 06/16/2023   ALT 22 06/16/2023   PROT 6.4 06/16/2023   ALBUMIN 4.1 06/16/2023   CALCIUM  9.0 06/16/2023   ANIONGAP 8 12/07/2022   EGFR 71 06/16/2023   Lab Results  Component Value Date   CHOL 202 (H) 06/16/2023   Lab Results  Component Value Date   HDL 51 06/16/2023   Lab Results  Component Value Date   LDLCALC 127 (H) 06/16/2023   Lab Results  Component  Value Date   TRIG 137 06/16/2023   Lab Results  Component Value Date   CHOLHDL 4.0 06/16/2023   Lab Results  Component Value Date   HGBA1C 6.6 (A) 12/16/2023      Assessment & Plan:   Problem List Items Addressed This Visit   None    No orders of the defined types were placed in this encounter.   Follow-up: No follow-ups on file.    Meldon Sport, MD

## 2024-03-01 NOTE — Telephone Encounter (Signed)
Left pt message to return call 

## 2024-03-01 NOTE — Patient Instructions (Addendum)
 Please start taking Effexor  75 mg twice daily. Please continue taking Cymbalta .  Please start taking Linzess  for constipation.  Please continue to take medications as prescribed.  Please continue to follow low carb diet and perform moderate exercise/walking as tolerated.  Please get fasting blood tests done before the next visit.

## 2024-03-01 NOTE — Assessment & Plan Note (Addendum)
 Considering her recent PAD screening test at home and other chronic medical conditions including type II DM, HTN and HLD - checked US  ABI, which was benign On aspirin  81 mg QD Continue atorvastatin  40 mg QD

## 2024-03-01 NOTE — Assessment & Plan Note (Addendum)
 BP Readings from Last 1 Encounters:  03/01/24 122/78   Well-controlled with Cardizem  240 mg QD Had to stop lisinopril  due to hypotension in the past Counseled for compliance with the medications Advised DASH diet and moderate exercise/walking, at least 150 mins/week

## 2024-03-01 NOTE — Assessment & Plan Note (Addendum)
 Lab Results  Component Value Date   HGBA1C 6.6 (A) 12/16/2023   Associated with HTN, GERD and depression Well controlled now On Ozempic  2 mg qw, followed by Endocrinology On statin and ACEi Diabetic eye exam: Referred to Ophthalmology On Cymbalta  for neuropathy On gabapentin  300 mg nightly for chronic pain

## 2024-03-01 NOTE — Assessment & Plan Note (Signed)
 Better controlled since starting Effexor , offered to increase dose, but she prefers to stay on same dose for now On Cymbalta  (for back pain) as well Switched from Wellbutrin  to Effexor  in the past as she was having resistant depression with Wellbutrin  300 mg dose

## 2024-03-04 NOTE — Assessment & Plan Note (Signed)
On atorvastatin 40 mg QD

## 2024-03-04 NOTE — Assessment & Plan Note (Addendum)
 Persistent constipation despite trying stool softeners Has tried Colace, Senokot, MiraLAX  without adequate relief Prescribed Linzess  72 mg once daily Advised to maintain at least 64 ounces of fluid intake Can try prune juice as needed

## 2024-03-04 NOTE — Assessment & Plan Note (Signed)
 Used to get Prolia , followed by Rheumatology in [place]  Referred to osteoporosis clinic for Prolia , but has not been able to schedule appointment yet Has tried oral bisphosphonates -Fosamax and Boniva, had GERD/esophagitis

## 2024-03-04 NOTE — Assessment & Plan Note (Signed)
 Followed by sleep specialist Has recently started using CPAP device

## 2024-03-06 NOTE — Progress Notes (Signed)
 Cardiology Office Note   Date:  03/12/2024   ID:  AKOSUA CONSTANTINE, DOB 08-27-1951, MRN [id number]  PCP:  Tobie Suzzane POUR, MD  Cardiologist:   Vina Gull, MD   Pt presents for follow up of HTN   History of Present Illness: Monique Warren is a 73 y.o. female with a history of HTN, cerebral aneurysm, s/p clip, asthma, syncope (remote) OSA(on CPAP),  GERD, esophagitis, T2DM and CP   Jan 2023  Echo  showed LVH   Vigorous LV functoin with near cavity obliteration during systolice   Recomm adding diltiazem  120 then 240  Cut back on lisinopril   March 2023   Coronary CT showed no CAD with 0 Ca score     May 2023  MRI  showed asymmetric septal hypoertrophy with SAM  Did not meet criteria for HCM.   Bicuspid AV           I saw the pt in May 2024 The pt says she still has episodes of chest pain  Usually at night   Not associated with meals or activity   Lasts until she goes to sleep   Pain can also occur during the day    Anxiety makes it worse  There is some pleuritic component     ? Related to arthritis    She says it is different than her reflux  which is usually a hot sensation    She says her reflux is under control    Diet:    Breakfast  Skps  Water and 1 coffee Dinner at 5   Chicken and rice and salad      Current Meds  Medication Sig   acetaminophen  (TYLENOL ) 500 MG tablet Take 1 tablet (500 mg total) by mouth every 6 (six) hours as needed for moderate pain.   AIMOVIG  140 MG/ML SOAJ INJECT 1 PEN UNDER THE SKIN 1 TIME MONTHLY   albuterol  (VENTOLIN  HFA) 108 (90 Base) MCG/ACT inhaler USE 2 INHALATIONS EVERY 6 HOURS AS NEEDED FOR WHEEZING OR SHORTNESS OF BREATH   atorvastatin  (LIPITOR) 40 MG tablet TAKE 1 TABLET AT BEDTIME   Blood Glucose Monitoring Suppl (ONE TOUCH ULTRA 2) w/Device KIT USE TO CHECK BLOOD GLUCOSE 4 TIMES A DAY   Cholecalciferol  (VITAMIN D3) 125 MCG (5000 UT) capsule Take 5,000 Units by mouth daily.   diltiazem  (CARDIZEM  SR) 120 MG 12 hr capsule Take 2  capsules (240 mg total) by mouth daily.   DULoxetine  (CYMBALTA ) 60 MG capsule TAKE 1 CAPSULE DAILY   esomeprazole  (NEXIUM ) 40 MG capsule TAKE 1 CAPSULE BY MOUTH TWICE DAILY BEFORE A MEAL   Estradiol  10 MCG TABS vaginal tablet Place 10 mcg vaginally every Monday.   famotidine  (PEPCID ) 40 MG tablet Take 1 tablet (40 mg total) by mouth 2 (two) times daily.   Fluticasone -Salmeterol,sensor, (AIRDUO DIGIHALER ) 232-14 MCG/ACT AEPB Inhale 1 Inhalation into the lungs 2 (two) times daily. USE 1 INHALATION TWICE A DAY Strength: 232-14 MCG/ACT   gabapentin  (NEURONTIN ) 300 MG capsule TAKE 1 CAPSULE AT BEDTIME   Lancets (ONETOUCH DELICA PLUS LANCET33G) MISC Use to check glucose once daily   linaclotide  (LINZESS ) 72 MCG capsule Take 1 capsule (72 mcg total) by mouth daily before breakfast.   meclizine  (ANTIVERT ) 25 MG tablet Take 1 tablet (25 mg total) by mouth 3 (three) times daily as needed for dizziness.   Multiple Vitamin (MULTIVITAMIN WITH MINERALS) TABS tablet Take 1 tablet by mouth daily.   ONETOUCH ULTRA test strip Use to  monitor glucose once daily   OVER THE COUNTER MEDICATION Take 2 oz by mouth daily. Dose (herbal supplement), liver detox, taking for constipation   PROLIA  60 MG/ML SOSY injection INJECT 60 MG UNDER THE SKIN EVERY 6 MONTHS   promethazine  (PHENERGAN ) 12.5 MG tablet Take 1 tablet (12.5 mg total) by mouth every 6 (six) hours as needed for nausea or vomiting.   Semaglutide , 2 MG/DOSE, 8 MG/3ML SOPN Inject 2 mg as directed once a week.   traMADol  (ULTRAM ) 50 MG tablet TAKE 1 TABLET BY MOUTH EVERY 12 HOURS AS NEEDED   triamcinolone  cream (KENALOG ) 0.1 % Apply 1 Application topically 2 (two) times daily.   venlafaxine  (EFFEXOR ) 75 MG tablet Take 1 tablet (75 mg total) by mouth 2 (two) times daily.   zaleplon  (SONATA ) 5 MG capsule Take 1 capsule (5 mg total) by mouth at bedtime as needed for sleep.   zolmitriptan (ZOMIG) 5 MG tablet Take 5 mg by mouth daily as needed for migraine.   Current  Facility-Administered Medications for the 03/12/24 encounter (Office Visit) with Okey Vina GAILS, MD  Medication   bupivacaine -meloxicam  ER (ZYNRELEF ) injection 400 mg   bupivacaine -meloxicam  ER (ZYNRELEF ) injection 400 mg     Allergies:   Cefuroxime, Codeine, Morphine , Sulfa antibiotics, Sulfamethoxazole-trimethoprim, and Gluten meal   Past Medical History:  Diagnosis Date   Anxiety    Arthritis    Asthma    Celiac disease    Depression    Diabetes mellitus, type II (HCC)    Diabetic neuropathy (HCC)    Diastolic dysfunction    Grade 1 with preserved EF   Dysrhythmia    GERD (gastroesophageal reflux disease)    Heart murmur    Hiatal hernia    Sliding   Hyperlipidemia    Hypertension    Migraine    Obstructive sleep apnea    Osteoporosis    Vertigo     Past Surgical History:  Procedure Laterality Date   ABDOMINAL HYSTERECTOMY  09/29/2003   APPENDECTOMY     BALLOON DILATION N/A 02/08/2022   Procedure: BALLOON DILATION;  Surgeon: Cindie Carlin POUR, DO;  [facility];  Service: Endoscopy;  Laterality: N/A;   BIOPSY  02/08/2022   Procedure: BIOPSY;  Surgeon: Cindie Carlin POUR, DO;  [facility];  Service: Endoscopy;;   BIOPSY  07/11/2023   Procedure: BIOPSY;  Surgeon: Cindie Carlin POUR, DO;  [facility];  Service: Endoscopy;;   BREAST BIOPSY Left    CARPAL TUNNEL RELEASE     CATARACT EXTRACTION Bilateral    October and November 2016   COLONOSCOPY  09/2017   Ozell GORMAN Colla, MD in [place]; 5 mm tubular adenoma in the descending colon s/p resected, mild sigmoid diverticulosis, internal hemorrhoids.  Recommended repeat colonoscopy in 5 years.   COLONOSCOPY WITH PROPOFOL  N/A 07/11/2023   Surgeon: Cindie Carlin POUR, DO; Nonbleeding internal hemorrhoids, diverticulosis in the sigmoid colon, 5 mm polyp in the transverse colon resected and retrieved, 8 mm polyp in the sigmoid colon resected and retrieved, single nonbleeding angiodysplastic lesion.   Pathology with 1 tubular adenoma and 1 hyperplastic polyp.  Recommended 7-year surveillance.   CRANIOTOMY FOR ANEURYSM / VERTEBROBASILAR / CAROTID CIRCULATION Right 10/24/2015   ESOPHAGOGASTRODUODENOSCOPY  09/2017   [name] ; irregular Z-line s/p biopsy, decreased LES tone, 4 cm hiatal hernia, erythema in gastric antrum biopsied, normal examined duodenum biopsied.  Esophageal biopsy suggestive of reflux, no Barrett's, gastric biopsy with nonspecific chronic gastritis with intestinal metaplasia, duodenal  biopsy with increased intraepithelial lymphocytes without blunting of villi, nonspecific.   ESOPHAGOGASTRODUODENOSCOPY (EGD) WITH PROPOFOL  N/A 02/08/2022   Surgeon: Cindie Carlin POUR, DO; 2 cm hiatal hernia, short segment Barrett's esophagus without dysplasia, mild Schatzki's ring dilated, gastritis with biopsies benign, normal examined duodenum.  Repeat EGD in 5 years.   ESOPHAGOGASTRODUODENOSCOPY (EGD) WITH PROPOFOL  N/A 07/11/2023   Surgeon: Cindie Carlin POUR, DO;  cm hiatal hernia, mucosal changes secondary to short segment Barrett's esophagus biopsied, esophageal mucosal changes suspicious for EOE biopsied and dilated, gastritis biopsied. Gastric biopsies benign, all esophageal biopsies benign.   KNEE ARTHROSCOPY WITH MEDIAL MENISECTOMY Left 04/09/2022   Procedure: KNEE ARTHROSCOPY WITH PARTIAL MEDIAL MENISCECTOMY, LATERAL MENISCAL DEBRIDEMENT;  Surgeon: Margrette Taft BRAVO, MD;  Location: [facility];  Service: Orthopedics;  Laterality: Left;   KNEE ARTHROSCOPY WITH MENISCAL REPAIR Left 04/09/2022   Procedure: KNEE ARTHROSCOPY WITH MEDIAL MENISCAL REPAIR;  Surgeon: Margrette Taft BRAVO, MD;  Location: [facility];  Service: Orthopedics;  Laterality: Left;   LAPAROSCOPY     POLYPECTOMY  07/11/2023   Procedure: POLYPECTOMY;  Surgeon: Cindie Carlin POUR, DO;  [facility];  Service: Endoscopy;;   TOTAL KNEE ARTHROPLASTY Right 12/06/2022   Procedure: TOTAL KNEE ARTHROPLASTY;  Surgeon: Margrette Taft BRAVO, MD;  Location: [facility];  Service: Orthopedics;  Laterality: Right;   UTERINE FIBROID SURGERY     WRIST SURGERY Bilateral      Social History:  The patient  reports that she has quit smoking. Her smoking use included cigarettes. She has never used smokeless tobacco. She reports current alcohol use of about 4.0 standard drinks of alcohol per week. She reports that she does not use drugs.   Family History:  The patient's family history includes Breast cancer in her mother and paternal grandmother; Heart attack in her father; Heart failure in her father; Hyperlipidemia in her father; Hypertension in her father; Stroke in her mother; Thyroid disease in her mother.    ROS:  Please see the history of present illness. All other systems are reviewed and  Negative to the above problem except as noted.    PHYSICAL EXAM: VS:  BP 132/74 (BP Location: Right Arm)   Pulse (!) 103   Ht 5' 2 (1.575 m)   Wt 190 lb 12.8 oz (86.5 kg)   SpO2 97%   BMI 34.90 kg/m   HZW:[place] 73 yo in no acute distress  HEENT: normal  Neck: no JVD, no bruits Cardiac: RRR; II/Vi systolic murmur LSB to apex     No LE edema  Respiratory:  CTA GI: soft, nontender   No hepatomegaly   EKG:  EKG shows ST  103  LVH   with repol abnormality  MRI  May 2023  FINDINGS: 1. Small to normal left ventricular size, with LVEDD 45 mm, and LVEDVi 59 mL/m2.   Asymmetric left ventricular remodeling, with intraventricular septal thickness of 14 mm, posterior wall thickness of 8 mm, and myocardial mass index of 55 g/m2.   Hyperdynamic left ventricular systolic function (LVEF =69%). There is systolic anterior of the mitral chordae tendineae, qualitatively there is LVOT obstruction.   Left ventricular parametric mapping notable for normal T2 signal and normal ECV signal.   There is no late gadolinium enhancement in the left ventricular myocardium.   2.  Small to normal right ventricular size with RVEDVI 46 mL/m2.    Normal right ventricular thickness.   Normal right ventricular systolic function (RVEF 62%=). There are no regional wall motion abnormalities  or aneurysms.   3.  Normal left and right atrial size.   4. Normal size of the aortic root, ascending aorta and pulmonary artery.   5. Valve assessment:   Aortic Valve: Siever's Zero Bicuspid Valve. Mean gradient 2 mm Hg. Regurgitant fraction 2 %. There is no significant regurgitation or stenosis.   Pulmonic Valve: Mean gradient < 1 mm Hg. Regurgitant fraction 1 %. There is no significant regurgitation or stenosis.   Tricuspid Valve: Qualitatively, there is mild, central regurgitation.   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 Regurgitant volume 17 mL. Regurgitant fraction 24%. Mild to moderate central mitral regurgitation.   6.  Normal pericardium.  No pericardial effusion.   7. Grossly, no extracardiac findings. Recommended dedicated study if concerned for non-cardiac pathology.   8.  Wrap Around artifact noted.   IMPRESSION: Unless patient has family history of hypertrophic cardiomyopathy, diagnostic criteria for HCM not met.   Siever's Zero Bicuspid Valve without significant aortopathy, stenosis, or regurgitation.   Mild to moderate mitral regurgitation.    CT coronary angiogram   March 2023  Calcium  Score: No calcium  noted   Coronary Arteries: Right dominant with no anomalies   LM: Normal side by side LCX/LAD ostia   IM: Small vessel normal   LAD: Normal   D1: Normal   D2: Normal   D3: Normal   Circumflex: Normal   OM1: Normal   OM2: Normal   RCA: Normal   PDA: Normal   PLA: Normal   IMPRESSION: 1. Calcium  score 0   2.  CAD RADS 0 normal right dominant coronary arteries   3.  Normal ascending thoracic aorta 3.3 cm    Echo  10/11/21  Left ventricular ejection fraction, by estimation, is 70 to 75%. The left ventricle has hyperdynamic function. The left ventricle  has no regional wall motion abnormalities. There is moderate concentric left ventricular hypertrophy. Left ventricular diastolic parameters are consistent with Grade I diastolic dysfunction (impaired relaxation). Elevated left ventricular end-diastolic pressure. There is a mildly increased LVOT gradient that is stable with Valsalva, also some degree of mitral chordal SAM based on limited views. 1. Right ventricular systolic function is normal. The right ventricular size is normal. Tricuspid regurgitation signal is inadequate for assessing PA pressure. 2. 3. The mitral valve is grossly normal. Trivial mitral valve regurgitation. The aortic valve was not well visualized. There is mild calcification of the aortic valve. Aortic valve regurgitation is not visualized. Aortic valve mean gradient measures 10.0 mmHg. Suspect LVOT gradient more a contributor than any significant degree of aortic stenosis. 4. 5. Unable to estimate CVP. Comparison(s): No prior Echocardiogram.   Lipid Panel    Component Value Date/Time   CHOL 202 (H) 06/16/2023 1129   TRIG 137 06/16/2023 1129   HDL 51 06/16/2023 1129   CHOLHDL 4.0 06/16/2023 1129   LDLCALC 127 (H) 06/16/2023 1129      Wt Readings from Last 3 Encounters:  03/12/24 190 lb 12.8 oz (86.5 kg)  03/01/24 194 lb 9.6 oz (88.3 kg)  02/25/24 180 lb (81.6 kg)      ASSESSMENT AND PLAN:   1  Chest pain  I think the pt's pain is not cardiac   SOme reproduction with chest pressure    ? Musculoskeletal   Work up in past negative  Character hasa not really changed    2  HOCM physiology.   Vigorous LV function with SAM   MRI neg for HOCM  Will repeat echo to reassess gradient      Stay hydrated   3  Bicuspid AV    No significant gradient  Follow up with another echo   4  HTN   BP is fair   Follow   5  HL  Pt on lipitor   LDL 127   HDL 51   Should be tighter given DM   WIll repeat    6 DM  Watch Carbs   Last A1C 6.6   Needs tighter control        Current medicines are reviewed at length with the patient today.  The patient does not have concerns regarding medicines.  Signed, Vina Gull, MD  03/12/2024 2:11 PM    [facility] [address] [place], [place], [place]  [address] Phone: [phone]; Fax: [phone]

## 2024-03-09 NOTE — Telephone Encounter (Signed)
 Left pt message to call back

## 2024-03-10 NOTE — Telephone Encounter (Signed)
 Left pt message to call back

## 2024-03-12 ENCOUNTER — Ambulatory Visit: Attending: Internal Medicine | Admitting: Internal Medicine

## 2024-03-12 ENCOUNTER — Encounter: Payer: Self-pay | Admitting: Internal Medicine

## 2024-03-12 VITALS — BP 132/74 | HR 103 | Ht 62.0 in | Wt 190.8 lb

## 2024-03-12 DIAGNOSIS — R011 Cardiac murmur, unspecified: Secondary | ICD-10-CM | POA: Diagnosis not present

## 2024-03-12 DIAGNOSIS — E782 Mixed hyperlipidemia: Secondary | ICD-10-CM

## 2024-03-12 DIAGNOSIS — R079 Chest pain, unspecified: Secondary | ICD-10-CM

## 2024-03-12 NOTE — Patient Instructions (Signed)
 Medication Instructions:   Your physician recommends that you continue on your current medications as directed. Please refer to the Current Medication list given to you today.   Labwork: Fasting lipids  Testing/Procedures: Your physician has requested that you have an echocardiogram. Echocardiography is a painless test that uses sound waves to create images of your heart. It provides your doctor with information about the size and shape of your heart and how well your heart's chambers and valves are working. This procedure takes approximately one hour. There are no restrictions for this procedure. Please do NOT wear cologne, perfume, aftershave, or lotions (deodorant is allowed). Please arrive 15 minutes prior to your appointment time.  Please note: We ask at that you not bring children with you during ultrasound (echo/ vascular) testing. Due to room size and safety concerns, children are not allowed in the ultrasound rooms during exams. Our front office staff cannot provide observation of children in our lobby area while testing is being conducted. An adult accompanying a patient to their appointment will only be allowed in the ultrasound room at the discretion of the ultrasound technician under special circumstances. We apologize for any inconvenience.   Follow-Up: Spring 2026  Any Other Special Instructions Will Be Listed Below (If Applicable).  If you need a refill on your cardiac medications before your next appointment, please call your pharmacy.

## 2024-03-15 ENCOUNTER — Telehealth: Payer: Self-pay

## 2024-03-15 DIAGNOSIS — E782 Mixed hyperlipidemia: Secondary | ICD-10-CM

## 2024-03-15 NOTE — Telephone Encounter (Signed)
 Pt has lab slip for lipomed

## 2024-03-15 NOTE — Telephone Encounter (Signed)
-----   Message from Vina Gull sent at 03/12/2024 10:02 PM EDT ----- Please set up for lipomed

## 2024-03-17 DIAGNOSIS — E1165 Type 2 diabetes mellitus with hyperglycemia: Secondary | ICD-10-CM | POA: Diagnosis not present

## 2024-03-25 NOTE — Progress Notes (Signed)
 Office Visit Note  Patient: Monique Warren             Date of Birth: 01-28-1951           MRN: [id number]             PCP: Tobie Suzzane POUR, MD Referring: Persons, Ronal Dragon, [place] Visit Date: 04/08/2024 Occupation: @GUAROCC @  Subjective:  Pain in multiple joints, positive ANA   History of Present Illness: Monique Warren is a 73 y.o. female seen for the evaluation of polyarthralgia and positive ANA.  Patient states she has had joint pain for more than 20 years.  She was seen by rheumatologist in [place]  who diagnosed her with osteoarthritis and degenerative disc disease.  She was also diagnosed with osteoporosis.  She recalls taking oral bisphosphonates but could not tolerate it.  About 15 years ago she was a started on Prolia  shots which she continued after moving to [place] .  She states she moved to [place]  about 3 years ago.  She continued to have joint pain and discomfort which has been gradually getting worse.  She had bilateral CMC surgery.  She also has been under care of Dr. Margrette who has been injecting her shoulder.  He diagnosed her with osteoarthritis of her right shoulder and also bursitis of her left shoulder joint.  She continues to have discomfort in her shoulders, wrist and her hands.  She also complains of discomfort in her hip joints.  She had a right total knee replacement by Dr. Margrette in April 2024.  She also had a meniscal tear in her left knee joint in the past.  She states Dr. Margrette discussed left total knee replacement with her.  She complains of discomfort in her ankles and her feet.  She notices fluid retention in her legs.  She has not noticed any joint swelling.  She continues to have discomfort in her neck upper back and lower back.  There is no personal or family history of psoriasis.  She believes her mother could have had rheumatoid arthritis.  She was found to have positive ANA and this the reason she was referred to me.  She gives history of  dry mouth and dry eyes.  She gets fever blisters in her mouth.  There is no history of Raynaud's, hair loss or lymphadenopathy.  There is no history of inflammatory arthritis. She is retired she used to be Chiropodist.  She is married, right-handed, gravida 3, para 3.  There is no history of preeclampsia or DVTs.  She drinks alcohol occasionally.  She quit smoking in 2012.  She smoked 1 pack/day for over 50 years.    Activities of Daily Living:  Patient reports morning stiffness for 1 hour.   Patient Reports nocturnal pain.  Difficulty dressing/grooming: Reports Difficulty climbing stairs: Reports Difficulty getting out of chair: Reports Difficulty using hands for taps, buttons, cutlery, and/or writing: Reports  Review of Systems  Constitutional:  Positive for fatigue.  HENT:  Positive for mouth sores and mouth dryness.        Fever blisters  Eyes:  Positive for dryness.  Respiratory:  Positive for shortness of breath.   Cardiovascular:  Positive for chest pain. Negative for palpitations.  Gastrointestinal:  Positive for constipation. Negative for blood in stool and diarrhea.  Endocrine: Positive for increased urination.  Genitourinary:  Negative for involuntary urination.  Musculoskeletal:  Positive for joint pain, gait problem, joint pain, joint swelling, myalgias, muscle weakness, morning  stiffness, muscle tenderness and myalgias.  Skin:  Positive for sensitivity to sunlight. Negative for color change, rash and hair loss.  Allergic/Immunologic: Positive for susceptible to infections.  Neurological:  Positive for dizziness and headaches.  Hematological:  Negative for swollen glands.  Psychiatric/Behavioral:  Positive for depressed mood and sleep disturbance. The patient is nervous/anxious.     PMFS History:  Patient Active Problem List   Diagnosis Date Noted   Insomnia 02/23/2024   Obesity 02/23/2024   Polyarthralgia 11/03/2023   Acute non-recurrent maxillary sinusitis  10/16/2023   PVD (peripheral vascular disease) (HCC) 07/25/2023   Bursitis of left shoulder 06/16/2023   Allergic dermatitis 06/16/2023   Blurry vision, bilateral 06/16/2023   COVID-19 05/02/2023   Primary osteoarthritis of right knee 12/06/2022   Status post total right knee replacement 12/06/2022   Chronic fatigue 10/02/2022   Acute pain of right shoulder 07/05/2022   S/P left knee arthroscopy 04/09/22 04/15/2022   Acute medial meniscus tear of left knee    Tear of lateral meniscus of left knee    Encounter for general adult medical examination with abnormal findings 04/01/2022   Dysphagia 01/16/2022   Gastroesophageal reflux disease 01/16/2022   Barrett's esophagus without dysplasia 01/16/2022   Chronic idiopathic constipation 01/16/2022   Fatty liver 01/16/2022   History of colonic polyps 01/16/2022   Constipation 10/02/2021   Bilateral thumb pain 08/14/2021   Chest pain 07/02/2021   Hernia of abdominal cavity 06/12/2021   Vertigo 06/12/2021   Carpal tunnel syndrome of right wrist 05/16/2021   Polyneuropathy due to type 2 diabetes mellitus (HCC) 05/16/2021   MDD (major depressive disorder), recurrent episode (HCC) 04/19/2021   HTN (hypertension) 04/19/2021   BPPV (benign paroxysmal positional vertigo) 09/25/2019   Celiac disease 09/25/2019   Complex sleep apnea syndrome 09/25/2019   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   Hyperlipidemia 10/16/2015   Asthma 09/27/2015   Migraine without aura or status migrainosus 09/27/2015   Other intervertebral disc degeneration, lumbosacral region 01/26/2014   Age-related osteoporosis without current pathological fracture 09/16/2008   Type 2 diabetes mellitus with diabetic neuropathy, unspecified (HCC) 09/16/2008   Sliding hiatal hernia Sep 03, 1951    Past Medical History:  Diagnosis Date   Anxiety    Arthritis    Asthma    Celiac disease    Depression    Diabetes mellitus, type II (HCC)    Diabetic neuropathy (HCC)     Diastolic dysfunction    Grade 1 with preserved EF   Dysrhythmia    GERD (gastroesophageal reflux disease)    Heart murmur    Hiatal hernia    Sliding   Hyperlipidemia    Hypertension    Migraine    Obstructive sleep apnea    Osteoporosis    Vertigo     Family History  Problem Relation Age of Onset   Breast cancer Mother    Thyroid disease Mother    Stroke Mother    Hypertension Father    Hyperlipidemia Father    Heart attack Father    Heart failure Father    Breast cancer Paternal Grandmother    Cancer - Colon Neg Hx    Gastric cancer Neg Hx    Esophageal cancer Neg Hx    Liver cancer Neg Hx    Autoimmune disease Neg Hx    Past Surgical History:  Procedure Laterality Date   ABDOMINAL HYSTERECTOMY  09/29/2003   APPENDECTOMY     BALLOON DILATION N/A 02/08/2022   Procedure: BALLOON  DILATION;  Surgeon: Cindie Carlin POUR, DO;  [facility];  Service: Endoscopy;  Laterality: N/A;   BIOPSY  02/08/2022   Procedure: BIOPSY;  Surgeon: Cindie Carlin POUR, DO;  [facility];  Service: Endoscopy;;   BIOPSY  07/11/2023   Procedure: BIOPSY;  Surgeon: Cindie Carlin POUR, DO;  [facility];  Service: Endoscopy;;   BREAST BIOPSY Left    CARPAL TUNNEL RELEASE     CATARACT EXTRACTION Bilateral    October and November 2016   COLONOSCOPY  09/2017   Ozell GORMAN Colla, MD in [place]; 5 mm tubular adenoma in the descending colon s/p resected, mild sigmoid diverticulosis, internal hemorrhoids.  Recommended repeat colonoscopy in 5 years.   COLONOSCOPY WITH PROPOFOL  N/A 07/11/2023   Surgeon: Cindie Carlin POUR, DO; Nonbleeding internal hemorrhoids, diverticulosis in the sigmoid colon, 5 mm polyp in the transverse colon resected and retrieved, 8 mm polyp in the sigmoid colon resected and retrieved, single nonbleeding angiodysplastic lesion.  Pathology with 1 tubular adenoma and 1 hyperplastic polyp.  Recommended 7-year surveillance.   CRANIOTOMY FOR ANEURYSM /  VERTEBROBASILAR / CAROTID CIRCULATION Right 10/24/2015   ESOPHAGOGASTRODUODENOSCOPY  09/2017   [name] ; irregular Z-line s/p biopsy, decreased LES tone, 4 cm hiatal hernia, erythema in gastric antrum biopsied, normal examined duodenum biopsied.  Esophageal biopsy suggestive of reflux, no Barrett's, gastric biopsy with nonspecific chronic gastritis with intestinal metaplasia, duodenal biopsy with increased intraepithelial lymphocytes without blunting of villi, nonspecific.   ESOPHAGOGASTRODUODENOSCOPY (EGD) WITH PROPOFOL  N/A 02/08/2022   Surgeon: Cindie Carlin POUR, DO; 2 cm hiatal hernia, short segment Barrett's esophagus without dysplasia, mild Schatzki's ring dilated, gastritis with biopsies benign, normal examined duodenum.  Repeat EGD in 5 years.   ESOPHAGOGASTRODUODENOSCOPY (EGD) WITH PROPOFOL  N/A 07/11/2023   Surgeon: Cindie Carlin POUR, DO;  cm hiatal hernia, mucosal changes secondary to short segment Barrett's esophagus biopsied, esophageal mucosal changes suspicious for EOE biopsied and dilated, gastritis biopsied. Gastric biopsies benign, all esophageal biopsies benign.   KNEE ARTHROSCOPY WITH MEDIAL MENISECTOMY Left 04/09/2022   Procedure: KNEE ARTHROSCOPY WITH PARTIAL MEDIAL MENISCECTOMY, LATERAL MENISCAL DEBRIDEMENT;  Surgeon: Margrette Taft BRAVO, MD;  Location: [facility];  Service: Orthopedics;  Laterality: Left;   KNEE ARTHROSCOPY WITH MENISCAL REPAIR Left 04/09/2022   Procedure: KNEE ARTHROSCOPY WITH MEDIAL MENISCAL REPAIR;  Surgeon: Margrette Taft BRAVO, MD;  Location: [facility];  Service: Orthopedics;  Laterality: Left;   LAPAROSCOPY     POLYPECTOMY  07/11/2023   Procedure: POLYPECTOMY;  Surgeon: Cindie Carlin POUR, DO;  [facility];  Service: Endoscopy;;   TOTAL KNEE ARTHROPLASTY Right 12/06/2022   Procedure: TOTAL KNEE ARTHROPLASTY;  Surgeon: Margrette Taft BRAVO, MD;  Location: [facility];  Service: Orthopedics;  Laterality: Right;   UTERINE FIBROID SURGERY     WRIST SURGERY  Bilateral    Social History   Social History Narrative   Not on file   Immunization History  Administered Date(s) Administered   Fluad Quad(high Dose 65+) 07/02/2021, 06/05/2022   Fluad Trivalent(High Dose 65+) 06/16/2023   Influenza Split 07/26/2014   Influenza, High Dose Seasonal PF 09/22/2018   Influenza-Unspecified 07/26/2014   PFIZER(Purple Top)SARS-COV-2 Vaccination 12/01/2019, 12/29/2019   Pneumococcal Conjugate-13 08/02/2019   Pneumococcal Polysaccharide-23 07/28/2020   Tdap 09/30/2016   Zoster Recombinant(Shingrix) 06/03/2018, 11/10/2019     Objective: Vital Signs: BP 95/68 (BP Location: Left Arm, Patient Position: Sitting, Cuff Size: Large)   Pulse 82   Resp 14   Ht 5' 3 (1.6 m)  Wt 190 lb (86.2 kg)   BMI 33.66 kg/m    Physical Exam Vitals and nursing note reviewed.  Constitutional:      Appearance: She is well-developed.  HENT:     Head: Normocephalic and atraumatic.  Eyes:     Conjunctiva/sclera: Conjunctivae normal.  Cardiovascular:     Rate and Rhythm: Normal rate and regular rhythm.     Heart sounds: Murmur heard.  Pulmonary:     Effort: Pulmonary effort is normal.     Breath sounds: Normal breath sounds.  Abdominal:     General: Bowel sounds are normal.     Palpations: Abdomen is soft.  Musculoskeletal:     Cervical back: Normal range of motion.  Lymphadenopathy:     Cervical: No cervical adenopathy.  Skin:    General: Skin is warm and dry.     Capillary Refill: Capillary refill takes less than 2 seconds.  Neurological:     Mental Status: She is alert and oriented to person, place, and time.  Psychiatric:        Behavior: Behavior normal.      Musculoskeletal Exam: She has stiffness with range of motion of the cervical spine.  Mild thoracic kyphosis without tenderness was noted.  She had limited range of motion of the lumbar spine with discomfort.  Shoulders were in good range of motion with discomfort.  Elbow joints and wrist joints in  good range of motion.  She had bilateral PIP and DIP thickening.  No synovitis was noted.  Hip joints were in good range of motion without discomfort.  Right knee joint was replaced and was in good range of motion.  Left knee joint was in good range of motion without any warmth swelling or effusion.  There was no tenderness over ankles.  Dorsal spurs was noted.  PIP and DIP thickening was noted.  No synovitis was noted.  CDAI Exam: CDAI Score: -- Patient Global: --; Provider Global: -- Swollen: --; Tender: -- Joint Exam 04/08/2024   No joint exam has been documented for this visit   There is currently no information documented on the homunculus. Go to the Rheumatology activity and complete the homunculus joint exam.  Investigation: No additional findings.  Imaging: ECHOCARDIOGRAM COMPLETE Result Date: 04/06/2024    ECHOCARDIOGRAM REPORT   Patient Name:   SHATORA WEATHERBEE Date of Exam: 04/06/2024 Medical Rec #:  [id number]         Height:       62.0 in Accession #:    [id number]        Weight:       190.8 lb Date of Birth:  [birth year]          BSA:          1.874 m Patient Age:    72 years          BP:           140/88 mmHg Patient Gender: F                 HR:           80 bpm. Exam Location:  [facility] Procedure: 2D Echo, Cardiac Doppler and Color Doppler (Both Spectral and Color            Flow Doppler were utilized during procedure). Indications:    Murmur R01.1  History:        Patient has prior history of Echocardiogram examinations, most  recent 10/11/2021. Risk Factors:Hypertension, Diabetes,                 Dyslipidemia and Sleep Apnea. Hx of COVID-19.  Sonographer:    Aida Pizza RCS Referring Phys: [id number] PAULA V ROSS IMPRESSIONS  1. LV is severely hypoertrophied, more prominent than on echo from 2023. Narrow LVOT. Turbulent flow through LV/LVOT WIth Valsalva maneuver, peak gradient through LV/LVOT increases to 86 mm Hg (4.63 m/sec) Overall consistent with HOCM     . Left  ventricular ejection fraction, by estimation, is 65 to 70%. The left ventricle has normal function. The left ventricle has no regional wall motion abnormalities. There is severe asymmetric left ventricular hypertrophy. Left ventricular diastolic parameters are consistent with Grade I diastolic dysfunction (impaired relaxation). Elevated left atrial pressure.  2. Right ventricular systolic function is normal. The right ventricular size is normal.  3. The mitral valve is normal in structure. Mild mitral valve regurgitation.  4. AV is thickened, probable trileaflet Difficult to see well Mildly restricted motion. . The aortic valve was not well visualized. Aortic valve regurgitation is not visualized. Aortic valve sclerosis/calcification is present, without any evidence of aortic stenosis.  5. The inferior vena cava is normal in size with greater than 50% respiratory variability, suggesting right atrial pressure of 3 mmHg. Comparison(s): The left ventricular function is unchanged. FINDINGS  Left Ventricle: LV is severely hypoertrophied, more prominent than on echo from 2023. Narrow LVOT. Turbulent flow through LV/LVOT WIth Valsalva maneuver, peak gradient through LV/LVOT increases to 86 mm Hg (4.63 m/sec) Overall consistent with HOCM. Left ventricular ejection fraction, by estimation, is 65 to 70%. The left ventricle has normal function. The left ventricle has no regional wall motion abnormalities. The left ventricular internal cavity size was small. There is severe asymmetric left ventricular hypertrophy. Left ventricular diastolic parameters are consistent with Grade I diastolic dysfunction (impaired relaxation). Elevated left atrial pressure. Right Ventricle: The right ventricular size is normal. Right vetricular wall thickness was not assessed. Right ventricular systolic function is normal. Left Atrium: Left atrial size was normal in size. Right Atrium: Right atrial size was normal in size. Pericardium: There is no  evidence of pericardial effusion. Mitral Valve: The mitral valve is normal in structure. Mild mitral valve regurgitation. Tricuspid Valve: The tricuspid valve is grossly normal. Tricuspid valve regurgitation is trivial. Aortic Valve: AV is thickened, probable trileaflet Difficult to see well Mildly restricted motion. The aortic valve was not well visualized. Aortic valve regurgitation is not visualized. Aortic valve sclerosis/calcification is present, without any evidence of aortic stenosis. Aortic valve mean gradient measures 30.0 mmHg. Aortic valve peak gradient measures 58.7 mmHg. Pulmonic Valve: The pulmonic valve was not well visualized. Pulmonic valve regurgitation is not visualized. No evidence of pulmonic stenosis. Aorta: The aortic root is normal in size and structure. Venous: The inferior vena cava is normal in size with greater than 50% respiratory variability, suggesting right atrial pressure of 3 mmHg. IAS/Shunts: No atrial level shunt detected by color flow Doppler.  LEFT VENTRICLE PLAX 2D LVIDd:         3.10 cm   Diastology LVIDs:         2.30 cm   LV e' medial:    3.26 cm/s LV PW:         1.78 cm   LV E/e' medial:  24.3 LV IVS:        2.17 cm   LV e' lateral:   4.13 cm/s LVOT diam:  1.80 cm   LV E/e' lateral: 19.2 LVOT Area:     2.54 cm  RIGHT VENTRICLE RV S prime:     12.10 cm/s TAPSE (M-mode): 1.6 cm LEFT ATRIUM             Index        RIGHT ATRIUM          Index LA diam:        3.50 cm 1.87 cm/m   RA Area:     8.61 cm LA Vol (A2C):   48.2 ml 25.72 ml/m  RA Volume:   15.80 ml 8.43 ml/m LA Vol (A4C):   44.9 ml 23.96 ml/m LA Biplane Vol: 47.8 ml 25.51 ml/m  AORTIC VALVE AV Vmax:      383.00 cm/s AV Vmean:     251.000 cm/s AV VTI:       0.737 m AV Peak Grad: 58.7 mmHg AV Mean Grad: 30.0 mmHg  AORTA Ao Root diam: 3.10 cm MITRAL VALVE MV Area (PHT): 2.27 cm     SHUNTS MV Decel Time: 335 msec     Systemic Diam: 1.80 cm MV E velocity: 79.25 cm/s MV A velocity: 119.00 cm/s MV E/A ratio:  0.67  Vina Gull MD Electronically signed by Vina Gull MD Signature Date/Time: 04/06/2024/4:02:55 PM    Final     Recent Labs: Lab Results  Component Value Date   WBC 7.4 06/16/2023   HGB 14.8 06/16/2023   PLT 328 06/16/2023   NA 142 06/16/2023   K 4.6 06/16/2023   CL 106 06/16/2023   CO2 22 06/16/2023   GLUCOSE 102 (H) 06/16/2023   BUN 14 06/16/2023   CREATININE 0.87 06/16/2023   BILITOT 0.3 06/16/2023   ALKPHOS 94 06/16/2023   AST 19 06/16/2023   ALT 22 06/16/2023   PROT 6.4 06/16/2023   ALBUMIN 4.1 06/16/2023   CALCIUM  9.0 06/16/2023   October 16, 2023 urine protein creatinine ratio normal November 03, 2023 ANA 1: 40 cytoplasmic, 1: 40 NS, ESR 2, uric acid 5.0, RF negative December 16, 2023 hemoglobin A1c 6.6 Speciality Comments: No specialty comments available.  Procedures:  No procedures performed Allergies: Cefuroxime, Codeine, Morphine , Sulfa antibiotics, Sulfamethoxazole-trimethoprim, and Gluten meal   Assessment / Plan:     Visit Diagnoses: Positive ANA (antinuclear antibody) -patient has low titer ANA which does not appear to be significant.  She gives history of dry mouth and dry eyes.  There is no history of oral ulcers, nasal ulcers, malar rash, photosensitivity, Raynaud's or lymphadenopathy.  She denies history of inflammatory arthritis.  Plan: Anti-scleroderma antibody, RNP Antibody, Anti-Smith antibody, Sjogrens syndrome-A extractable nuclear antibody, Sjogrens syndrome-B extractable nuclear antibody, Anti-DNA antibody, double-stranded, C3 and C4  Sicca syndrome (HCC)-she gives history of significant dry mouth and dry eyes.  She also gets frequent cavities.  Over-the-counter products were discussed.  I will obtain additional labs today.  Polyarthralgia -she complains of pain and discomfort in multiple joints.  Rheumatoid factor was negative and sed rate was normal.  Plan: Cyclic citrul peptide antibody, IgG  Bursitis of left shoulder-diagnosed by Dr. Margrette.  She has  had cortisone injections in the past.  Primary osteoarthritis, right shoulder - November 2023 x-rays showed mild degenerative arthritis of the glenohumeral and acromioclavicular joint.  Patient states she had cortisone injections in the past and continues to have some discomfort.  Primary osteoarthritis of both hands -she complains of pain and discomfort in her bilateral hands.  Bilateral PIP and DIP thickening was  noted.  Status post bilateral CMC surgery - Plan: XR Hand 2 View Right, XR Hand 2 View Left.  X-rays showed generalized osteopenia and osteoarthritis.  Hardware was noted in the left radius.  Pain in both wrists - h/o wrist fracture after a fall RWJ 1993, LWJ 2014.  She has chronic discomfort in her wrist joints.  No synovitis was noted.  Chronic pain of both hips-she complains of discomfort in the bilateral hip joints.  Her hip joints were in good range of motion without discomfort.  Status post total right knee replacement - 12/2022 by Dr. Margrette.  Doing well.  Primary osteoarthritis of left knee - MRI February 22, 2022 showed torn meniscus.  She also had previous x-rays which showed some degenerative changes.  Patient states Dr. Margrette discussed total knee replacement with her.  Pain in both feet -she complains of pain and discomfort in the bilateral feet.  Dorsal spurring was noted.  PIP and DIP thickening was noted.  No synovitis was noted.  Plan: XR Foot 2 Views Right, XR Foot 2 Views Left.  X-rays with history of osteoarthritis.  Neck pain-she has chronic discomfort in her cervical spine for more than 20 years.  She states previous x-rays showed degenerative changes.  I do not have x-rays available.  She denies any radiculopathy.  Lumbar spondylosis-she has been diagnosed with degenerative disease of lumbar spine in the past.  She continues to have chronic discomfort.  She denies any radiculopathy.  Age-related osteoporosis without current pathological fracture - 10/23/23 The BMD  measured at Femur Neck Left is 0.744 g/cm2 with a T-score of -2.1.  On Prolia  injections for the last 15 years.  Last injection on 07/ 23/25.  Patient states she was treated with oral bisphosphonates prior to Prolia  injections.  She had to discontinue due to GI side effects.  Chronic fatigue-complains of chronic fatigue.  Other medical problems are listed as follows:  Type 2 diabetes mellitus with diabetic neuropathy, without long-term current use of insulin (HCC)  Polyneuropathy due to type 2 diabetes mellitus (HCC)  Mixed hyperlipidemia  Primary hypertension blood-pressure was 95/68 today.  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  Allergic dermatitis  Moderate episode of recurrent major depressive disorder (HCC)  Former smoker - quit smoking 2012, smoked 1PPD X 50 years  Orders: Orders Placed This Encounter  Procedures   XR Hand 2 View Right   XR Hand 2 View Left   XR Foot 2 Views Right   XR Foot 2 Views Left   Cyclic citrul peptide antibody, IgG   Anti-scleroderma antibody   RNP Antibody   Anti-Smith antibody   Sjogrens syndrome-A extractable nuclear antibody   Sjogrens syndrome-B extractable nuclear antibody   Anti-DNA antibody, double-stranded   C3 and C4   No orders of the defined types were placed in this encounter.    Follow-Up Instructions: Return for Polyarthralgia.   Maya Nash, MD  Note - This record has been created using Animal nutritionist.  Chart creation errors have been sought, but may not always  have been located. Such creation errors do not reflect on   the standard of medical care.

## 2024-03-30 ENCOUNTER — Ambulatory Visit: Admitting: Nurse Practitioner

## 2024-03-30 ENCOUNTER — Encounter: Payer: Self-pay | Admitting: Nurse Practitioner

## 2024-03-30 DIAGNOSIS — E559 Vitamin D deficiency, unspecified: Secondary | ICD-10-CM

## 2024-03-30 DIAGNOSIS — Z7985 Long-term (current) use of injectable non-insulin antidiabetic drugs: Secondary | ICD-10-CM

## 2024-03-30 DIAGNOSIS — E782 Mixed hyperlipidemia: Secondary | ICD-10-CM

## 2024-03-30 DIAGNOSIS — I10 Essential (primary) hypertension: Secondary | ICD-10-CM

## 2024-03-30 DIAGNOSIS — E1140 Type 2 diabetes mellitus with diabetic neuropathy, unspecified: Secondary | ICD-10-CM

## 2024-04-05 ENCOUNTER — Encounter: Payer: Self-pay | Admitting: Internal Medicine

## 2024-04-06 ENCOUNTER — Encounter: Payer: Self-pay | Admitting: Nurse Practitioner

## 2024-04-06 ENCOUNTER — Ambulatory Visit: Admitting: Nurse Practitioner

## 2024-04-06 ENCOUNTER — Ambulatory Visit (HOSPITAL_COMMUNITY)
Admission: RE | Admit: 2024-04-06 | Discharge: 2024-04-06 | Disposition: A | Discharge status: Home / Self Care | Source: Ambulatory Visit | Attending: Internal Medicine | Admitting: Internal Medicine

## 2024-04-06 VITALS — BP 104/76 | HR 87 | Ht 62.0 in | Wt 190.2 lb

## 2024-04-06 DIAGNOSIS — I10 Essential (primary) hypertension: Secondary | ICD-10-CM

## 2024-04-06 DIAGNOSIS — R011 Cardiac murmur, unspecified: Secondary | ICD-10-CM | POA: Insufficient documentation

## 2024-04-06 DIAGNOSIS — E559 Vitamin D deficiency, unspecified: Secondary | ICD-10-CM | POA: Diagnosis not present

## 2024-04-06 DIAGNOSIS — E1140 Type 2 diabetes mellitus with diabetic neuropathy, unspecified: Secondary | ICD-10-CM | POA: Diagnosis not present

## 2024-04-06 DIAGNOSIS — E782 Mixed hyperlipidemia: Secondary | ICD-10-CM

## 2024-04-06 DIAGNOSIS — Z7985 Long-term (current) use of injectable non-insulin antidiabetic drugs: Secondary | ICD-10-CM

## 2024-04-06 LAB — ECHOCARDIOGRAM COMPLETE
AV Mean grad: 30 mmHg
AV Peak grad: 58.7 mmHg
Ao pk vel: 3.83 m/s
Area-P 1/2: 2.27 cm2
Height: 62 in
S' Lateral: 2.3 cm
Weight: 3043.2 [oz_av]

## 2024-04-06 LAB — POCT GLYCOSYLATED HEMOGLOBIN (HGB A1C): Hemoglobin A1C: 5.9 % — AB (ref 4.0–5.6)

## 2024-04-06 NOTE — Progress Notes (Signed)
*  PRELIMINARY RESULTS* Echocardiogram 2D Echocardiogram has been performed.  Teresa Aida PARAS 04/06/2024, 11:37 AM

## 2024-04-06 NOTE — Patient Instructions (Signed)
 High-Fiber Eating Plan Fiber, also called dietary fiber, is found in foods such as fruits, vegetables, whole grains, and beans. A high-fiber diet can be good for your health. Your health care provider may recommend a high-fiber diet to help: Prevent trouble pooping (constipation). Lower your cholesterol. Treat the following conditions: Hemorrhoids. This is inflammation of veins in the anus. Inflammation of specific areas of the digestive tract. Irritable bowel syndrome (IBS). This is a problem of the large intestine, also called the colon, that sometimes causes belly pain and bloating. Prevent overeating as part of a weight-loss plan. Lower the risk of heart disease, type 2 diabetes, and certain cancers. What are tips for following this plan? Reading food labels  Check the nutrition facts label on foods for the amount of dietary fiber. Choose foods that have 4 grams of fiber or more per serving. The recommended goals for how much fiber you should eat each day include: Males 40 years old or younger: 30-34 g. Males over 37 years old: 28-34 g. Females 64 years old or younger: 25-28 g. Females over 69 years old: 22-25 g. Your daily fiber goal is _____________ g. Shopping Choose whole fruits and vegetables instead of processed. For example, choose apples instead of apple juice or applesauce. Choose a variety of high-fiber foods such as avocados, lentils, oats, and pinto beans. Read the nutrition facts label on foods. Check for foods with added fiber. These foods often have high sugar and salt (sodium) amounts per serving. Cooking Use whole-grain flour for baking and cooking. Cook with brown rice instead of white rice. Make meals that have a lot of beans and vegetables in them, such as chili or vegetable-based soups. Meal planning Start the day with a breakfast that is high in fiber, such as a cereal that has 5 g of fiber or more per serving. Eat breads and cereals that are made with  whole-grain flour instead of refined flour or white flour. Eat brown rice, bulgur wheat, or millet instead of white rice. Use beans in place of meat in soups, salads, and pasta dishes. Be sure that half of the grains you eat each day are whole grains. General information You can get the recommended amount of dietary fiber by: Eating a variety of fruits, vegetables, grains, nuts, and beans. Taking a fiber supplement if you aren't able to eat enough fiber. It's better to get fiber through food than from a supplement. Slowly increase how much fiber you eat. If you increase the amount of fiber you eat too quickly, you may have bloating, cramping, or gas. Drink plenty of water to help you digest fiber. Choose high-fiber snacks, such as berries, raw vegetables, nuts, and popcorn. What foods should I eat? Fruits Berries. Pears. Apples. Oranges. Avocado. Prunes and raisins. Dried figs. Vegetables Sweet potatoes. Spinach. Kale. Artichokes. Cabbage. Broccoli. Cauliflower. Green peas. Carrots. Squash. Grains Whole-grain breads. Multigrain cereal. Oats and oatmeal. Brown rice. Barley. Bulgur wheat. Millet. Quinoa. Bran muffins. Popcorn. Rye wafer crackers. Meats and other proteins Navy beans, kidney beans, and pinto beans. Soybeans. Split peas. Lentils. Nuts and seeds. Dairy Fiber-fortified yogurt. Fortified means that fiber has been added to the product. Beverages Fiber-fortified soy milk. Fiber-fortified orange juice. Other foods Fiber bars. The items listed above may not be all the foods and drinks you can have. Talk to a dietitian to learn more. What foods should I avoid? Fruits Fruit juice. Cooked, strained fruit. Vegetables Fried potatoes. Canned vegetables. Well-cooked vegetables. Grains White bread. Pasta made with refined  flour. White rice. Meats and other proteins Fatty meat. Fried chicken or fried fish. Dairy Milk. Cream cheese. Sour cream. Fats and  oils Butters. Beverages Soft drinks. Other foods Cakes and pastries. The items listed above may not be all the foods and drinks you should avoid. Talk to a dietitian to learn more. This information is not intended to replace advice given to you by your health care provider. Make sure you discuss any questions you have with your health care provider. Document Revised: 11/25/2022 Document Reviewed: 11/25/2022 [employer] Patient Education  2024 [employer].

## 2024-04-06 NOTE — Progress Notes (Signed)
 Endocrinology Follow Up Note       04/06/2024, 9:50 AM   Subjective:    Patient ID: Monique Warren, female    DOB: [birth year].  Monique Warren is being seen in follow up after being seen in consultation for management of currently uncontrolled symptomatic diabetes requested by  Tobie Suzzane POUR, MD.   Past Medical History:  Diagnosis Date   Anxiety    Arthritis    Asthma    Celiac disease    Depression    Diabetes mellitus, type II (HCC)    Diabetic neuropathy (HCC)    Diastolic dysfunction    Grade 1 with preserved EF   Dysrhythmia    GERD (gastroesophageal reflux disease)    Heart murmur    Hiatal hernia    Sliding   Hyperlipidemia    Hypertension    Migraine    Obstructive sleep apnea    Osteoporosis    Vertigo     Past Surgical History:  Procedure Laterality Date   ABDOMINAL HYSTERECTOMY  09/29/2003   APPENDECTOMY     BALLOON DILATION N/A 02/08/2022   Procedure: BALLOON DILATION;  Surgeon: Cindie Carlin POUR, DO;  [facility];  Service: Endoscopy;  Laterality: N/A;   BIOPSY  02/08/2022   Procedure: BIOPSY;  Surgeon: Cindie Carlin POUR, DO;  [facility];  Service: Endoscopy;;   BIOPSY  07/11/2023   Procedure: BIOPSY;  Surgeon: Cindie Carlin POUR, DO;  [facility];  Service: Endoscopy;;   BREAST BIOPSY Left    CARPAL TUNNEL RELEASE     CATARACT EXTRACTION Bilateral    October and November 2016   COLONOSCOPY  09/2017   Ozell GORMAN Colla, MD in [place]; 5 mm tubular adenoma in the descending colon s/p resected, mild sigmoid diverticulosis, internal hemorrhoids.  Recommended repeat colonoscopy in 5 years.   COLONOSCOPY WITH PROPOFOL  N/A 07/11/2023   Surgeon: Cindie Carlin POUR, DO; Nonbleeding internal hemorrhoids, diverticulosis in the sigmoid colon, 5 mm polyp in the transverse colon resected and retrieved, 8 mm polyp in the sigmoid colon resected and retrieved,  single nonbleeding angiodysplastic lesion.  Pathology with 1 tubular adenoma and 1 hyperplastic polyp.  Recommended 7-year surveillance.   CRANIOTOMY FOR ANEURYSM / VERTEBROBASILAR / CAROTID CIRCULATION Right 10/24/2015   ESOPHAGOGASTRODUODENOSCOPY  09/2017   [name] ; irregular Z-line s/p biopsy, decreased LES tone, 4 cm hiatal hernia, erythema in gastric antrum biopsied, normal examined duodenum biopsied.  Esophageal biopsy suggestive of reflux, no Barrett's, gastric biopsy with nonspecific chronic gastritis with intestinal metaplasia, duodenal biopsy with increased intraepithelial lymphocytes without blunting of villi, nonspecific.   ESOPHAGOGASTRODUODENOSCOPY (EGD) WITH PROPOFOL  N/A 02/08/2022   Surgeon: Cindie Carlin POUR, DO; 2 cm hiatal hernia, short segment Barrett's esophagus without dysplasia, mild Schatzki's ring dilated, gastritis with biopsies benign, normal examined duodenum.  Repeat EGD in 5 years.   ESOPHAGOGASTRODUODENOSCOPY (EGD) WITH PROPOFOL  N/A 07/11/2023   Surgeon: Cindie Carlin POUR, DO;  cm hiatal hernia, mucosal changes secondary to short segment Barrett's esophagus biopsied, esophageal mucosal changes suspicious for EOE biopsied and dilated, gastritis biopsied. Gastric biopsies benign, all esophageal biopsies benign.   KNEE ARTHROSCOPY WITH MEDIAL MENISECTOMY Left 04/09/2022  Procedure: KNEE ARTHROSCOPY WITH PARTIAL MEDIAL MENISCECTOMY, LATERAL MENISCAL DEBRIDEMENT;  Surgeon: Margrette Taft BRAVO, MD;  Location: [facility];  Service: Orthopedics;  Laterality: Left;   KNEE ARTHROSCOPY WITH MENISCAL REPAIR Left 04/09/2022   Procedure: KNEE ARTHROSCOPY WITH MEDIAL MENISCAL REPAIR;  Surgeon: Margrette Taft BRAVO, MD;  Location: [facility];  Service: Orthopedics;  Laterality: Left;   LAPAROSCOPY     POLYPECTOMY  07/11/2023   Procedure: POLYPECTOMY;  Surgeon: Cindie Carlin POUR, DO;  [facility];  Service: Endoscopy;;   TOTAL KNEE ARTHROPLASTY Right 12/06/2022   Procedure: TOTAL  KNEE ARTHROPLASTY;  Surgeon: Margrette Taft BRAVO, MD;  Location: [facility];  Service: Orthopedics;  Laterality: Right;   UTERINE FIBROID SURGERY     WRIST SURGERY Bilateral     Social History   Socioeconomic History   Marital status: Married    Spouse name: Not on file   Number of children: Not on file   Years of education: Not on file   Highest education level: Associate degree: occupational, Scientist, product/process development, or vocational program  Occupational History   Not on file  Tobacco Use   Smoking status: Former    Current packs/day: 0.50    Types: Cigarettes   Smokeless tobacco: Never  Vaping Use   Vaping status: Never Used  Substance and Sexual Activity   Alcohol use: Yes    Alcohol/week: 4.0 standard drinks of alcohol    Types: 2 Glasses of wine, 2 Shots of liquor per week    Comment: rarely   Drug use: Never   Sexual activity: Not on file  Other Topics Concern   Not on file  Social History Narrative   Not on file   Social Drivers of Health   Financial Resource Strain: Low Risk  (02/27/2024)   Overall Financial Resource Strain (CARDIA)    Difficulty of Paying Living Expenses: Not hard at all  Food Insecurity: No Food Insecurity (02/27/2024)   Hunger Vital Sign    Worried About Running Out of Food in the Last Year: Never true    Ran Out of Food in the Last Year: Never true  Transportation Needs: No Transportation Needs (02/27/2024)   PRAPARE - Administrator, Civil Service (Medical): No    Lack of Transportation (Non-Medical): No  Physical Activity: Insufficiently Active (02/27/2024)   Exercise Vital Sign    Days of Exercise per Week: 1 day    Minutes of Exercise per Session: 30 min  Stress: Stress Concern Present (02/27/2024)   [employer] of Occupational Health - Occupational Stress Questionnaire    Feeling of Stress: Rather much  Social Connections: Moderately Integrated (02/27/2024)   Social Connection and Isolation Panel    Frequency of Communication with  Friends and Family: More than three times a week    Frequency of Social Gatherings with Friends and Family: More than three times a week    Attends Religious Services: 1 to 4 times per year    Active Member of [employer] or Organizations: No    Attends Engineer, structural: Not on file    Marital Status: Married    Family History  Problem Relation Age of Onset   Breast cancer Mother    Thyroid disease Mother    Stroke Mother    Hypertension Father    Hyperlipidemia Father    Heart attack Father    Heart failure Father    Breast cancer Paternal Grandmother    Cancer - Colon Neg Hx  Gastric cancer Neg Hx    Esophageal cancer Neg Hx    Liver cancer Neg Hx    Autoimmune disease Neg Hx     Outpatient Encounter Medications as of 04/06/2024  Medication Sig   acetaminophen  (TYLENOL ) 500 MG tablet Take 1 tablet (500 mg total) by mouth every 6 (six) hours as needed for moderate pain.   AIMOVIG  140 MG/ML SOAJ INJECT 1 PEN UNDER THE SKIN 1 TIME MONTHLY   albuterol  (VENTOLIN  HFA) 108 (90 Base) MCG/ACT inhaler USE 2 INHALATIONS EVERY 6 HOURS AS NEEDED FOR WHEEZING OR SHORTNESS OF BREATH   atorvastatin  (LIPITOR) 40 MG tablet TAKE 1 TABLET AT BEDTIME   Blood Glucose Monitoring Suppl (ONE TOUCH ULTRA 2) w/Device KIT USE TO CHECK BLOOD GLUCOSE 4 TIMES A DAY   Cholecalciferol  (VITAMIN D3) 125 MCG (5000 UT) capsule Take 5,000 Units by mouth daily.   diltiazem  (CARDIZEM  SR) 120 MG 12 hr capsule Take 2 capsules (240 mg total) by mouth daily.   DULoxetine  (CYMBALTA ) 60 MG capsule TAKE 1 CAPSULE DAILY   esomeprazole  (NEXIUM ) 40 MG capsule TAKE 1 CAPSULE BY MOUTH TWICE DAILY BEFORE A MEAL   Estradiol  10 MCG TABS vaginal tablet Place 10 mcg vaginally every Monday.   famotidine  (PEPCID ) 40 MG tablet Take 1 tablet (40 mg total) by mouth 2 (two) times daily.   Fluticasone -Salmeterol,sensor, (AIRDUO DIGIHALER ) 232-14 MCG/ACT AEPB Inhale 1 Inhalation into the lungs 2 (two) times daily. USE 1 INHALATION  TWICE A DAY Strength: 232-14 MCG/ACT   gabapentin  (NEURONTIN ) 300 MG capsule TAKE 1 CAPSULE AT BEDTIME   Lancets (ONETOUCH DELICA PLUS LANCET33G) MISC Use to check glucose once daily   linaclotide  (LINZESS ) 72 MCG capsule Take 1 capsule (72 mcg total) by mouth daily before breakfast.   meclizine  (ANTIVERT ) 25 MG tablet Take 1 tablet (25 mg total) by mouth 3 (three) times daily as needed for dizziness.   Multiple Vitamin (MULTIVITAMIN WITH MINERALS) TABS tablet Take 1 tablet by mouth daily.   ONETOUCH ULTRA test strip Use to monitor glucose once daily   PROLIA  60 MG/ML SOSY injection INJECT 60 MG UNDER THE SKIN EVERY 6 MONTHS   promethazine  (PHENERGAN ) 12.5 MG tablet Take 1 tablet (12.5 mg total) by mouth every 6 (six) hours as needed for nausea or vomiting.   Semaglutide , 2 MG/DOSE, 8 MG/3ML SOPN Inject 2 mg as directed once a week.   traMADol  (ULTRAM ) 50 MG tablet TAKE 1 TABLET BY MOUTH EVERY 12 HOURS AS NEEDED   triamcinolone  cream (KENALOG ) 0.1 % Apply 1 Application topically 2 (two) times daily.   venlafaxine  (EFFEXOR ) 75 MG tablet Take 1 tablet (75 mg total) by mouth 2 (two) times daily.   zaleplon  (SONATA ) 5 MG capsule Take 1 capsule (5 mg total) by mouth at bedtime as needed for sleep.   zolmitriptan (ZOMIG) 5 MG tablet Take 5 mg by mouth daily as needed for migraine.   [DISCONTINUED] OVER THE COUNTER MEDICATION Take 2 oz by mouth daily. Dose (herbal supplement), liver detox, taking for constipation   Facility-Administered Encounter Medications as of 04/06/2024  Medication   bupivacaine -meloxicam  ER (ZYNRELEF ) injection 400 mg   bupivacaine -meloxicam  ER (ZYNRELEF ) injection 400 mg    ALLERGIES: Allergies  Allergen Reactions   Cefuroxime Anaphylaxis, Hives, Shortness Of Breath, Swelling and Anxiety   Codeine Hives, Shortness Of Breath, Nausea And Vomiting, Swelling and Anxiety   Morphine  Anaphylaxis   Sulfa Antibiotics Anaphylaxis, Hives, Nausea And Vomiting and Swelling     Swollen throat  Sulfamethoxazole-Trimethoprim Shortness Of Breath, Swelling and Anxiety    Throat swelling    Gluten Meal     Severe reflux - celiac disease    VACCINATION STATUS: Immunization History  Administered Date(s) Administered   Fluad Quad(high Dose 65+) 07/02/2021, 06/05/2022   Fluad Trivalent(High Dose 65+) 06/16/2023   Influenza Split 07/26/2014   Influenza, High Dose Seasonal PF 09/22/2018   Influenza-Unspecified 07/26/2014   PFIZER(Purple Top)SARS-COV-2 Vaccination 12/01/2019, 12/29/2019   Pneumococcal Conjugate-13 08/02/2019   Pneumococcal Polysaccharide-23 07/28/2020   Tdap 09/30/2016   Zoster Recombinant(Shingrix) 06/03/2018, 11/10/2019    Diabetes She presents for her follow-up diabetic visit. She has type 2 diabetes mellitus. Onset time: Diagnosed at approx age of 42. Her disease course has been improving. There are no hypoglycemic associated symptoms. Associated symptoms include chest pain (ongoing work up with cardiology) and foot paresthesias. Pertinent negatives for diabetes include no polyuria and no weight loss. There are no hypoglycemic complications. Symptoms are stable. Diabetic complications include nephropathy and peripheral neuropathy. Risk factors for coronary artery disease include diabetes mellitus, dyslipidemia, family history, hypertension, obesity, post-menopausal and sedentary lifestyle. Current diabetic treatments: Ozempic  only. She is compliant with treatment all of the time. Her weight is fluctuating minimally. She is following a generally healthy diet. When asked about meal planning, she reported none. She has not had a previous visit with a dietitian. She participates in exercise intermittently. Her home blood glucose trend is decreasing steadily. Her breakfast blood glucose range is generally 130-140 mg/dl. (She presents today with her meter showing at goal glycemic profile overall.  Her POCT A1c today is 5.9%, improving from last A1c of 6.6%.  She  has stopped the Lantus  between visits with my advice since she was able to restart the Ozempic .  She does note constipation with this medication but has had this problem before starting it.  She takes Linzess  but it doesn't work that well.  She has appt with GI coming up to discuss.  We did talk about strategies to alleviate constipation.) An ACE inhibitor/angiotensin II receptor blocker is being taken. She does not see a podiatrist.Eye exam is current (has eye exam later today).  Hyperlipidemia This is a chronic problem. The current episode started more than 1 year ago. The problem is uncontrolled. Recent lipid tests were reviewed and are high. Exacerbating diseases include chronic renal disease, diabetes and obesity. Factors aggravating her hyperlipidemia include fatty foods. Associated symptoms include chest pain (ongoing work up with cardiology). Current antihyperlipidemic treatment includes statins. The current treatment provides moderate improvement of lipids. There are no compliance problems.  Risk factors for coronary artery disease include diabetes mellitus, dyslipidemia, family history, obesity, hypertension, post-menopausal and a sedentary lifestyle.  Hypertension This is a chronic problem. The current episode started more than 1 year ago. The problem has been gradually improving since onset. The problem is controlled. Associated symptoms include chest pain (ongoing work up with cardiology). There are no associated agents to hypertension. Risk factors for coronary artery disease include diabetes mellitus, dyslipidemia, family history, obesity, post-menopausal state and sedentary lifestyle. Past treatments include ACE inhibitors. The current treatment provides moderate improvement. There are no compliance problems.  Hypertensive end-organ damage includes kidney disease. Identifiable causes of hypertension include chronic renal disease and sleep apnea.     Review of systems  Constitutional: +  decreasing body weight, current Body mass index is 34.79 kg/m., no fatigue, no subjective hyperthermia, no subjective hypothermia Eyes: no blurry vision, no xerophthalmia ENT: no sore throat, no nodules palpated  in throat, no dysphagia/odynophagia, no hoarseness Cardiovascular: no chest pain, no shortness of breath, no palpitations, no leg swelling Respiratory: no cough, no shortness of breath Gastrointestinal: no nausea/vomiting/diarrhea, + constipation Musculoskeletal: + diffuse muscle/joint aches Skin: no rashes, no hyperemia Neurological: no tremors, + numbness/tingling to BLE, no dizziness Psychiatric: no depression, no anxiety  Objective:     BP 104/76 (BP Location: Right Arm, Patient Position: Sitting, Cuff Size: Large)   Pulse 87   Ht 5' 2 (1.575 m)   Wt 190 lb 3.2 oz (86.3 kg)   BMI 34.79 kg/m   Wt Readings from Last 3 Encounters:  04/06/24 190 lb 3.2 oz (86.3 kg)  03/12/24 190 lb 12.8 oz (86.5 kg)  03/01/24 194 lb 9.6 oz (88.3 kg)     BP Readings from Last 3 Encounters:  04/06/24 104/76  03/12/24 132/74  03/01/24 122/78      Physical Exam- Limited  Constitutional:  Body mass index is 34.79 kg/m. , not in acute distress, normal state of mind Eyes:  EOMI, no exophthalmos Musculoskeletal: no gross deformities, strength intact in all four extremities, no gross restriction of joint movements Skin:  no rashes, no hyperemia Neurological: no tremor with outstretched hands    Diabetic Foot Exam - Simple   No data filed     CMP ( most recent) CMP     Component Value Date/Time   NA 142 06/16/2023 1129   K 4.6 06/16/2023 1129   CL 106 06/16/2023 1129   CO2 22 06/16/2023 1129   GLUCOSE 102 (H) 06/16/2023 1129   GLUCOSE 181 (H) 12/07/2022 0557   BUN 14 06/16/2023 1129   CREATININE 0.87 06/16/2023 1129   CALCIUM  9.0 06/16/2023 1129   PROT 6.4 06/16/2023 1129   ALBUMIN 4.1 06/16/2023 1129   AST 19 06/16/2023 1129   ALT 22 06/16/2023 1129   ALKPHOS 94  06/16/2023 1129   BILITOT 0.3 06/16/2023 1129   GFRNONAA >60 12/07/2022 0557     Diabetic Labs (most recent): Lab Results  Component Value Date   HGBA1C 5.9 (A) 04/06/2024   HGBA1C 6.6 (A) 12/16/2023   HGBA1C 6.6 (A) 09/15/2023     Lipid Panel ( most recent) Lipid Panel     Component Value Date/Time   CHOL 202 (H) 06/16/2023 1129   TRIG 137 06/16/2023 1129   HDL 51 06/16/2023 1129   CHOLHDL 4.0 06/16/2023 1129   LDLCALC 127 (H) 06/16/2023 1129   LABVLDL 24 06/16/2023 1129      Lab Results  Component Value Date   TSH 1.520 06/16/2023   TSH 1.780 06/05/2022   TSH 2.830 10/02/2021   FREET4 1.08 10/02/2021           Assessment & Plan:   1) Type 2 diabetes mellitus with diabetic neuropathy, without long-term current use of insulin (HCC)  She presents today with her meter showing at goal glycemic profile overall.  Her POCT A1c today is 5.9%, improving from last A1c of 6.6%.  She has stopped the Lantus  between visits with my advice since she was able to restart the Ozempic .  She does note constipation with this medication but has had this problem before starting it.  She takes Linzess  but it doesn't work that well.  She has appt with GI coming up to discuss.  We did talk about strategies to alleviate constipation.  Analysis of her meter shows 7-day average of 141, 14-day average of 141, 30-day average of 151.  - Monique Warren has currently  uncontrolled symptomatic type 2 DM since 73 years of age.  -Recent labs reviewed.  - I had a long discussion with her about the progressive nature of diabetes and the pathology behind its complications. -her diabetes is complicated by mild CKD, peripheral neuropathy and she remains at a high risk for more acute and chronic complications which include CAD, CVA, CKD, retinopathy, and neuropathy. These are all discussed in detail with her.  - Nutritional counseling repeated at each appointment due to patients tendency to fall back in  to old habits.  - The patient admits there is a room for improvement in their diet and drink choices. -  Suggestion is made for the patient to avoid simple carbohydrates from their diet including Cakes, Sweet Desserts / Pastries, Ice Cream, Soda (diet and regular), Sweet Tea, Candies, Chips, Cookies, Sweet Pastries, Store Bought Juices, Alcohol in Excess of 1-2 drinks a day, Artificial Sweeteners, Coffee Creamer, and Sugar-free Products. This will help patient to have stable blood glucose profile and potentially avoid unintended weight gain.   - I encouraged the patient to switch to unprocessed or minimally processed complex starch and increased protein intake (animal or plant source), fruits, and vegetables.   - Patient is advised to stick to a routine mealtimes to eat 3 meals a day and avoid unnecessary snacks (to snack only to correct hypoglycemia).  - I have approached her with the following individualized plan to manage her diabetes and patient agrees:   -She can continue her Ozempic  2 mg SQ weekly for now.  She is advised to let me know what GI says about her constipation so we can adjust our plan accordingly.  She can stay off insulin for now.    -she is advised to check glucose once daily, before breakfast and to call the clinic if she has readings less than 70 or above 300 for 3 tests in a row.  She did not care for the Dexcom CGM, says it alarmed to much for her.  She prefers to do traditional fingersticks.  - She does not tolerate Metformin due to GI upset (r/t her Celiac disease) and has allergy to sulfa meds.  - Specific targets for  A1c; LDL, HDL, and Triglycerides were discussed with the patient.  2) Blood Pressure /Hypertension:  her blood pressure is controlled to target.   she is advised to continue her current medications including Lisinopril  20 mg p.o. daily with breakfast.  3) Lipids/Hyperlipidemia:    Her most recent lipid panel from 06/16/23 shows uncontrolled LDL of  127 (improving).  Her PCP counseled her on this and advised she restart it which she has.  She is advised to continue Lipitor 40 mg po daily at bedtime.  Side effects and precautions discussed with her.  She has labs upcoming with PCP.  4)  Weight/Diet:  her Body mass index is 34.79 kg/m.  -  clearly complicating her diabetes care.   she is a candidate for weight loss. I discussed with her the fact that loss of 5 - 10% of her  current body weight will have the most impact on her diabetes management.  Exercise, and detailed carbohydrates information provided  -  detailed on discharge instructions.  5) Chronic Care/Health Maintenance: -she is on ACEI/ARB and Statin medications and is encouraged to initiate and continue to follow up with Ophthalmology, Dentist, Podiatrist at least yearly or according to recommendations, and advised to stay away from smoking. I have recommended yearly flu vaccine and pneumonia vaccine  at least every 5 years; moderate intensity exercise for up to 150 minutes weekly; and sleep for at least 7 hours a day.  - she is advised to maintain close follow up with Tobie Suzzane POUR, MD for primary care needs, as well as her other providers for optimal and coordinated care.     I spent  31  minutes in the care of the patient today including review of labs from CMP, Lipids, Thyroid Function, Hematology (current and previous including abstractions from other facilities); face-to-face time discussing  her blood glucose readings/logs, discussing hypoglycemia and hyperglycemia episodes and symptoms, medications doses, her options of short and long term treatment based on the latest standards of care / guidelines;  discussion about incorporating lifestyle medicine;  and documenting the encounter. Risk reduction counseling performed per USPSTF guidelines to reduce obesity and cardiovascular risk factors.     Please refer to Patient Instructions for Blood Glucose Monitoring and  Insulin/Medications Dosing Guide  in media tab for additional information. Please  also refer to  Patient Self Inventory in the Media  tab for reviewed elements of pertinent patient history.  Monique Warren participated in the discussions, expressed understanding, and voiced agreement with the above plans.  All questions were answered to her satisfaction. she is encouraged to contact clinic should she have any questions or concerns prior to her return visit.     Follow up plan: - Return in about 4 months (around 08/07/2024) for Diabetes F/U with A1c in office, No previsit labs, Bring meter and logs.  [place], [facility] [address] [place], [place] [address] Phone: [phone] Fax: [phone]  04/06/2024, 9:50 AM

## 2024-04-06 NOTE — Telephone Encounter (Signed)
 Monique Warren, please reach patient to arrange follow-up.

## 2024-04-07 ENCOUNTER — Ambulatory Visit

## 2024-04-07 ENCOUNTER — Ambulatory Visit: Payer: Self-pay | Admitting: Internal Medicine

## 2024-04-07 ENCOUNTER — Other Ambulatory Visit (HOSPITAL_COMMUNITY): Payer: Self-pay

## 2024-04-07 ENCOUNTER — Other Ambulatory Visit: Payer: Self-pay

## 2024-04-07 DIAGNOSIS — Q782 Osteopetrosis: Secondary | ICD-10-CM

## 2024-04-07 DIAGNOSIS — M810 Age-related osteoporosis without current pathological fracture: Secondary | ICD-10-CM | POA: Diagnosis not present

## 2024-04-07 DIAGNOSIS — I517 Cardiomegaly: Secondary | ICD-10-CM

## 2024-04-07 MED ORDER — DENOSUMAB 60 MG/ML ~~LOC~~ SOSY
60.0000 mg | PREFILLED_SYRINGE | SUBCUTANEOUS | Status: AC
  Administered 2024-04-07: 60 mg via SUBCUTANEOUS

## 2024-04-07 NOTE — Progress Notes (Signed)
 Patient is in office today for a nurse visit for Prolia  60mg /ml Injection. Patient Injection was given in the  Left arm. Patient tolerated injection well.

## 2024-04-08 ENCOUNTER — Ambulatory Visit: Payer: Medicare HMO | Attending: Rheumatology | Admitting: Rheumatology

## 2024-04-08 ENCOUNTER — Ambulatory Visit

## 2024-04-08 ENCOUNTER — Encounter: Payer: Self-pay | Admitting: Rheumatology

## 2024-04-08 VITALS — BP 95/68 | HR 82 | Resp 14 | Ht 63.0 in | Wt 190.0 lb

## 2024-04-08 DIAGNOSIS — M79672 Pain in left foot: Secondary | ICD-10-CM | POA: Diagnosis not present

## 2024-04-08 DIAGNOSIS — I10 Essential (primary) hypertension: Secondary | ICD-10-CM

## 2024-04-08 DIAGNOSIS — I739 Peripheral vascular disease, unspecified: Secondary | ICD-10-CM

## 2024-04-08 DIAGNOSIS — M542 Cervicalgia: Secondary | ICD-10-CM

## 2024-04-08 DIAGNOSIS — M79671 Pain in right foot: Secondary | ICD-10-CM | POA: Diagnosis not present

## 2024-04-08 DIAGNOSIS — E782 Mixed hyperlipidemia: Secondary | ICD-10-CM

## 2024-04-08 DIAGNOSIS — G8929 Other chronic pain: Secondary | ICD-10-CM

## 2024-04-08 DIAGNOSIS — F331 Major depressive disorder, recurrent, moderate: Secondary | ICD-10-CM

## 2024-04-08 DIAGNOSIS — M19011 Primary osteoarthritis, right shoulder: Secondary | ICD-10-CM | POA: Diagnosis not present

## 2024-04-08 DIAGNOSIS — M25551 Pain in right hip: Secondary | ICD-10-CM

## 2024-04-08 DIAGNOSIS — M3500 Sicca syndrome, unspecified: Secondary | ICD-10-CM

## 2024-04-08 DIAGNOSIS — M7552 Bursitis of left shoulder: Secondary | ICD-10-CM | POA: Diagnosis not present

## 2024-04-08 DIAGNOSIS — M2550 Pain in unspecified joint: Secondary | ICD-10-CM | POA: Diagnosis not present

## 2024-04-08 DIAGNOSIS — G43009 Migraine without aura, not intractable, without status migrainosus: Secondary | ICD-10-CM

## 2024-04-08 DIAGNOSIS — M19041 Primary osteoarthritis, right hand: Secondary | ICD-10-CM | POA: Diagnosis not present

## 2024-04-08 DIAGNOSIS — I671 Cerebral aneurysm, nonruptured: Secondary | ICD-10-CM

## 2024-04-08 DIAGNOSIS — Z860100 Personal history of colon polyps, unspecified: Secondary | ICD-10-CM

## 2024-04-08 DIAGNOSIS — E1140 Type 2 diabetes mellitus with diabetic neuropathy, unspecified: Secondary | ICD-10-CM

## 2024-04-08 DIAGNOSIS — K2270 Barrett's esophagus without dysplasia: Secondary | ICD-10-CM

## 2024-04-08 DIAGNOSIS — M19042 Primary osteoarthritis, left hand: Secondary | ICD-10-CM | POA: Diagnosis not present

## 2024-04-08 DIAGNOSIS — Z96651 Presence of right artificial knee joint: Secondary | ICD-10-CM

## 2024-04-08 DIAGNOSIS — J4540 Moderate persistent asthma, uncomplicated: Secondary | ICD-10-CM

## 2024-04-08 DIAGNOSIS — K449 Diaphragmatic hernia without obstruction or gangrene: Secondary | ICD-10-CM

## 2024-04-08 DIAGNOSIS — E1142 Type 2 diabetes mellitus with diabetic polyneuropathy: Secondary | ICD-10-CM

## 2024-04-08 DIAGNOSIS — R768 Other specified abnormal immunological findings in serum: Secondary | ICD-10-CM

## 2024-04-08 DIAGNOSIS — M25531 Pain in right wrist: Secondary | ICD-10-CM | POA: Diagnosis not present

## 2024-04-08 DIAGNOSIS — Z87891 Personal history of nicotine dependence: Secondary | ICD-10-CM

## 2024-04-08 DIAGNOSIS — M1712 Unilateral primary osteoarthritis, left knee: Secondary | ICD-10-CM | POA: Diagnosis not present

## 2024-04-08 DIAGNOSIS — M25532 Pain in left wrist: Secondary | ICD-10-CM

## 2024-04-08 DIAGNOSIS — K900 Celiac disease: Secondary | ICD-10-CM

## 2024-04-08 DIAGNOSIS — M25552 Pain in left hip: Secondary | ICD-10-CM

## 2024-04-08 DIAGNOSIS — L239 Allergic contact dermatitis, unspecified cause: Secondary | ICD-10-CM

## 2024-04-08 DIAGNOSIS — M47816 Spondylosis without myelopathy or radiculopathy, lumbar region: Secondary | ICD-10-CM

## 2024-04-08 DIAGNOSIS — G4739 Other sleep apnea: Secondary | ICD-10-CM

## 2024-04-08 DIAGNOSIS — R5382 Chronic fatigue, unspecified: Secondary | ICD-10-CM

## 2024-04-08 DIAGNOSIS — G4709 Other insomnia: Secondary | ICD-10-CM

## 2024-04-08 DIAGNOSIS — K219 Gastro-esophageal reflux disease without esophagitis: Secondary | ICD-10-CM

## 2024-04-08 DIAGNOSIS — M810 Age-related osteoporosis without current pathological fracture: Secondary | ICD-10-CM

## 2024-04-11 LAB — SJOGRENS SYNDROME-B EXTRACTABLE NUCLEAR ANTIBODY: SSB (La) (ENA) Antibody, IgG: <1 AI

## 2024-04-11 LAB — SJOGRENS SYNDROME-A EXTRACTABLE NUCLEAR ANTIBODY: SSA (Ro) (ENA) Antibody, IgG: <1 AI

## 2024-04-11 LAB — ANTI-SMITH ANTIBODY: ENA SM Ab Ser-aCnc: <1 AI

## 2024-04-11 LAB — C3 AND C4
C3 Complement: 140 mg/dL (ref 83–193)
C4 Complement: 18 mg/dL (ref 15–57)

## 2024-04-11 LAB — ANTI-SCLERODERMA ANTIBODY: Scleroderma (Scl-70) (ENA) Antibody, IgG: <1 AI

## 2024-04-11 LAB — RNP ANTIBODY: Ribonucleic Protein(ENA) Antibody, IgG: 1.3 AI — AB

## 2024-04-11 LAB — ANTI-DNA ANTIBODY, DOUBLE-STRANDED: ds DNA Ab: <1 [IU]/mL

## 2024-04-11 LAB — CYCLIC CITRUL PEPTIDE ANTIBODY, IGG: Cyclic Citrullin Peptide Ab: <16 U

## 2024-04-12 ENCOUNTER — Ambulatory Visit: Payer: Self-pay | Admitting: Rheumatology

## 2024-04-12 NOTE — Progress Notes (Signed)
 RNP is low titer positive.  All other antibodies were negative.  I will discuss results at the follow-up visit.

## 2024-04-19 ENCOUNTER — Other Ambulatory Visit: Payer: Self-pay | Admitting: Internal Medicine

## 2024-04-19 ENCOUNTER — Other Ambulatory Visit: Payer: Self-pay | Admitting: Nurse Practitioner

## 2024-04-19 DIAGNOSIS — K219 Gastro-esophageal reflux disease without esophagitis: Secondary | ICD-10-CM

## 2024-04-21 ENCOUNTER — Ambulatory Visit

## 2024-04-21 VITALS — Ht 63.0 in | Wt 180.0 lb

## 2024-04-21 DIAGNOSIS — Z0000 Encounter for general adult medical examination without abnormal findings: Secondary | ICD-10-CM | POA: Diagnosis not present

## 2024-04-21 NOTE — Patient Instructions (Signed)
 Monique Warren , Thank you for taking time out of your busy schedule to complete your Annual Wellness Visit with me. I enjoyed our conversation and look forward to speaking with you again next year. I, as well as your care team,  appreciate your ongoing commitment to your health goals. Please review the following plan we discussed and let me know if I can assist you in the future. Your Game plan/ To Do List    Referrals: If you haven't heard from the office you've been referred to, please reach out to them at the phone provided.  N/A Follow up Visits: We will see or speak with you next year for your Next Medicare AWV with our clinical staff  Clinician Recommendations:  Aim for 30 minutes of exercise or brisk walking, 6-8 glasses of water, and 5 servings of fruits and vegetables each day.    Wishing you many blessings and good health during the next year until our next visit.  -Khaleah Duer   This is a list of the screenings recommended for you:  Health Maintenance  Topic Date Due   COVID-19 Vaccine (3 - Pfizer risk series) 01/26/2020   Flu Shot  04/16/2024   Yearly kidney function blood test for diabetes  06/15/2024   Eye exam for diabetics  06/23/2024   Hemoglobin A1C  10/07/2024   Yearly kidney health urinalysis for diabetes  10/15/2024   Complete foot exam   10/15/2024   Mammogram  10/22/2024   Medicare Annual Wellness Visit  04/21/2025   DEXA scan (bone density measurement)  10/22/2025   DTaP/Tdap/Td vaccine (2 - Td or Tdap) 09/30/2026   Colon Cancer Screening  07/10/2028   Pneumococcal Vaccine for age over 59  Completed   Hepatitis C Screening  Completed   Zoster (Shingles) Vaccine  Completed   Hepatitis B Vaccine  Aged Out   HPV Vaccine  Aged Out   Meningitis B Vaccine  Aged Out    Advanced directives: (Declined) Advance directive discussed with you today. Even though you declined this today, please call our office should you change your mind, and we can give you the proper paperwork  for you to fill out. Advance Care Planning is important because it:  [x]  Makes sure you receive the medical care that is consistent with your values, goals, and preferences  [x]  It provides guidance to your family and loved ones and reduces their decisional burden about whether or not they are making the right decisions based on your wishes.  Follow the link provided in your after visit summary or read over the paperwork we have mailed to you to help you started getting your Advance Directives in place. If you need assistance in completing these, please reach out to us  so that we can help you!  See attachments for Preventive Care and Fall Prevention Tips.

## 2024-04-21 NOTE — Progress Notes (Signed)
 Subjective:   Monique Warren is a 73 y.o. who presents for a Medicare Wellness preventive visit.  As a reminder, Annual Wellness Visits don't include a physical exam, and some assessments may be limited, especially if this visit is performed virtually. We may recommend an in-person follow-up visit with your provider if needed.  Visit Complete: Virtual I connected with  Monique Warren on 04/21/24 by a audio enabled telemedicine application and verified that I am speaking with the correct person using two identifiers.  Patient Location: Home  Provider Location: Home Office  I discussed the limitations of evaluation and management by telemedicine. The patient expressed understanding and agreed to proceed.  Vital Signs: Because this visit was a virtual/telehealth visit, some criteria may be missing or patient reported. Any vitals not documented were not able to be obtained and vitals that have been documented are patient reported.  VideoDeclined- This patient declined Librarian, academic. Therefore the visit was completed with audio only.  Persons Participating in Visit: Patient.  AWV Questionnaire: Yes: Patient Medicare AWV questionnaire was completed by the patient on 04/19/2024; I have confirmed that all information answered by patient is correct and no changes since this date.  Cardiac Risk Factors include: advanced age (>47men, >58 women);diabetes mellitus;dyslipidemia;hypertension;obesity (BMI >30kg/m2);sedentary lifestyle     Objective:    Today's Vitals   04/19/24 1509 04/21/24 1535  Weight:  180 lb (81.6 kg)  Height:  5' 3 (1.6 m)  PainSc: 5     Body mass index is 31.89 kg/m.     04/21/2024    3:33 PM 02/25/2024    7:56 PM 07/09/2023   11:48 AM 06/02/2023   11:16 AM 01/10/2023   10:42 AM 12/10/2022   10:00 AM 11/29/2022    2:21 PM  Advanced Directives  Does Patient Have a Medical Advance Directive? Yes Yes Yes Yes Yes Yes Yes  Type of  Estate agent of [place];Living will Healthcare Power of [place];Living will Healthcare Power of [place];Living will Living will Living will Healthcare Power of [place];Living will Healthcare Power of [place];Living will  Does patient want to make changes to medical advance directive?  No - Patient declined No - Patient declined  No - Patient declined No - Patient declined No - Patient declined  Copy of Healthcare Power of Attorney in Chart? No - copy requested No - copy requested No - copy requested   No - copy requested No - copy requested  Would patient like information on creating a medical advance directive?      No - Patient declined No - Patient declined    Current Medications (verified) Outpatient Encounter Medications as of 04/21/2024  Medication Sig   acetaminophen  (TYLENOL ) 500 MG tablet Take 1 tablet (500 mg total) by mouth every 6 (six) hours as needed for moderate pain.   AIMOVIG  140 MG/ML SOAJ INJECT 1 PEN UNDER THE SKIN 1 TIME MONTHLY   albuterol  (VENTOLIN  HFA) 108 (90 Base) MCG/ACT inhaler USE 2 INHALATIONS EVERY 6 HOURS AS NEEDED FOR WHEEZING OR SHORTNESS OF BREATH   atorvastatin  (LIPITOR) 40 MG tablet TAKE 1 TABLET AT BEDTIME   Blood Glucose Monitoring Suppl (ONE TOUCH ULTRA 2) w/Device KIT USE TO CHECK BLOOD GLUCOSE 2 TIMES A DAY   Cholecalciferol  (VITAMIN D3) 125 MCG (5000 UT) capsule Take 5,000 Units by mouth daily.   diltiazem  (CARDIZEM  SR) 120 MG 12 hr capsule Take 2 capsules (240 mg total) by mouth daily.   DULoxetine  (CYMBALTA ) 60  MG capsule TAKE 1 CAPSULE DAILY   esomeprazole  (NEXIUM ) 40 MG capsule TAKE 1 CAPSULE BY MOUTH TWICE DAILY BEFORE A MEAL   famotidine  (PEPCID ) 40 MG tablet TAKE 1 TABLET DAILY   Fluticasone -Salmeterol,sensor, (AIRDUO DIGIHALER ) 232-14 MCG/ACT AEPB Inhale 1 Inhalation into the lungs 2 (two) times daily. USE 1 INHALATION TWICE A DAY Strength: 232-14 MCG/ACT   gabapentin  (NEURONTIN ) 300 MG capsule TAKE 1 CAPSULE AT BEDTIME    Lancets (ONETOUCH DELICA PLUS LANCET33G) MISC Use to check glucose once daily   linaclotide  (LINZESS ) 72 MCG capsule Take 1 capsule (72 mcg total) by mouth daily before breakfast.   meclizine  (ANTIVERT ) 25 MG tablet Take 1 tablet (25 mg total) by mouth 3 (three) times daily as needed for dizziness.   methocarbamol  (ROBAXIN ) 500 MG tablet Take 500 mg by mouth 4 (four) times daily.   Multiple Vitamin (MULTIVITAMIN WITH MINERALS) TABS tablet Take 1 tablet by mouth daily.   ONETOUCH ULTRA test strip Use to monitor glucose once daily   PROLIA  60 MG/ML SOSY injection INJECT 60 MG UNDER THE SKIN EVERY 6 MONTHS   promethazine  (PHENERGAN ) 12.5 MG tablet Take 1 tablet (12.5 mg total) by mouth every 6 (six) hours as needed for nausea or vomiting.   RABEprazole  (ACIPHEX ) 20 MG tablet Take 20 mg by mouth daily.   Semaglutide , 2 MG/DOSE, 8 MG/3ML SOPN Inject 2 mg as directed once a week.   traMADol  (ULTRAM ) 50 MG tablet TAKE 1 TABLET BY MOUTH EVERY 12 HOURS AS NEEDED   venlafaxine  (EFFEXOR ) 75 MG tablet Take 1 tablet (75 mg total) by mouth 2 (two) times daily.   zolmitriptan (ZOMIG) 5 MG tablet Take 5 mg by mouth daily as needed for migraine.   Estradiol  10 MCG TABS vaginal tablet Place 10 mcg vaginally every Monday. (Patient not taking: Reported on 04/21/2024)   triamcinolone  cream (KENALOG ) 0.1 % Apply 1 Application topically 2 (two) times daily. (Patient not taking: Reported on 04/21/2024)   zaleplon  (SONATA ) 5 MG capsule Take 1 capsule (5 mg total) by mouth at bedtime as needed for sleep. (Patient not taking: Reported on 04/21/2024)   Facility-Administered Encounter Medications as of 04/21/2024  Medication   bupivacaine -meloxicam  ER (ZYNRELEF ) injection 400 mg   bupivacaine -meloxicam  ER (ZYNRELEF ) injection 400 mg   denosumab  (PROLIA ) injection 60 mg    Allergies (verified) Cefuroxime, Codeine, Morphine , Sulfa antibiotics, Sulfamethoxazole-trimethoprim, and Gluten meal   History: Past Medical History:   Diagnosis Date   Allergy    Anxiety    Arthritis    Asthma    Cataract    Both eyes, but have been removed   Celiac disease    Depression    Diabetes mellitus, type II (HCC)    Diabetic neuropathy (HCC)    Diastolic dysfunction    Grade 1 with preserved EF   Dysrhythmia    GERD (gastroesophageal reflux disease)    Heart murmur    Hiatal hernia    Sliding   Hyperlipidemia    Hypertension    Migraine    Obstructive sleep apnea    Osteoporosis    Sleep apnea    Vertigo    Past Surgical History:  Procedure Laterality Date   ABDOMINAL HYSTERECTOMY  09/29/2003   APPENDECTOMY     BALLOON DILATION N/A 02/08/2022   Procedure: BALLOON DILATION;  Surgeon: Cindie Carlin POUR, DO;  [facility];  Service: Endoscopy;  Laterality: N/A;   BIOPSY  02/08/2022   Procedure: BIOPSY;  Surgeon: Cindie Carlin POUR,  DO;  [facility];  Service: Endoscopy;;   BIOPSY  07/11/2023   Procedure: BIOPSY;  Surgeon: Cindie Carlin POUR, DO;  [facility];  Service: Endoscopy;;   BRAIN SURGERY     BREAST BIOPSY Left    CARPAL TUNNEL RELEASE     CATARACT EXTRACTION Bilateral    October and November 2016   CESAREAN SECTION     COLONOSCOPY  09/2017   Ozell GORMAN Colla, MD in [place]; 5 mm tubular adenoma in the descending colon s/p resected, mild sigmoid diverticulosis, internal hemorrhoids.  Recommended repeat colonoscopy in 5 years.   COLONOSCOPY WITH PROPOFOL  N/A 07/11/2023   Surgeon: Cindie Carlin POUR, DO; Nonbleeding internal hemorrhoids, diverticulosis in the sigmoid colon, 5 mm polyp in the transverse colon resected and retrieved, 8 mm polyp in the sigmoid colon resected and retrieved, single nonbleeding angiodysplastic lesion.  Pathology with 1 tubular adenoma and 1 hyperplastic polyp.  Recommended 7-year surveillance.   CRANIOTOMY FOR ANEURYSM / VERTEBROBASILAR / CAROTID CIRCULATION Right 10/24/2015   ESOPHAGOGASTRODUODENOSCOPY  09/2017   [name] ; irregular Z-line s/p  biopsy, decreased LES tone, 4 cm hiatal hernia, erythema in gastric antrum biopsied, normal examined duodenum biopsied.  Esophageal biopsy suggestive of reflux, no Barrett's, gastric biopsy with nonspecific chronic gastritis with intestinal metaplasia, duodenal biopsy with increased intraepithelial lymphocytes without blunting of villi, nonspecific.   ESOPHAGOGASTRODUODENOSCOPY (EGD) WITH PROPOFOL  N/A 02/08/2022   Surgeon: Cindie Carlin POUR, DO; 2 cm hiatal hernia, short segment Barrett's esophagus without dysplasia, mild Schatzki's ring dilated, gastritis with biopsies benign, normal examined duodenum.  Repeat EGD in 5 years.   ESOPHAGOGASTRODUODENOSCOPY (EGD) WITH PROPOFOL  N/A 07/11/2023   Surgeon: Cindie Carlin POUR, DO;  cm hiatal hernia, mucosal changes secondary to short segment Barrett's esophagus biopsied, esophageal mucosal changes suspicious for EOE biopsied and dilated, gastritis biopsied. Gastric biopsies benign, all esophageal biopsies benign.   EYE SURGERY     JOINT REPLACEMENT     KNEE ARTHROSCOPY WITH MEDIAL MENISECTOMY Left 04/09/2022   Procedure: KNEE ARTHROSCOPY WITH PARTIAL MEDIAL MENISCECTOMY, LATERAL MENISCAL DEBRIDEMENT;  Surgeon: Margrette Taft BRAVO, MD;  Location: [facility];  Service: Orthopedics;  Laterality: Left;   KNEE ARTHROSCOPY WITH MENISCAL REPAIR Left 04/09/2022   Procedure: KNEE ARTHROSCOPY WITH MEDIAL MENISCAL REPAIR;  Surgeon: Margrette Taft BRAVO, MD;  Location: [facility];  Service: Orthopedics;  Laterality: Left;   LAPAROSCOPY     POLYPECTOMY  07/11/2023   Procedure: POLYPECTOMY;  Surgeon: Cindie Carlin POUR, DO;  [facility];  Service: Endoscopy;;   TOTAL KNEE ARTHROPLASTY Right 12/06/2022   Procedure: TOTAL KNEE ARTHROPLASTY;  Surgeon: Margrette Taft BRAVO, MD;  Location: [facility];  Service: Orthopedics;  Laterality: Right;   TUBAL LIGATION     UTERINE FIBROID SURGERY     WRIST SURGERY Bilateral    Family History  Problem Relation Age of Onset   Breast  cancer Mother    Thyroid disease Mother    Stroke Mother    Arthritis Mother    Depression Mother    Hyperlipidemia Mother    Hypertension Father    Hyperlipidemia Father    Heart attack Father    Heart failure Father    Alcohol abuse Father    Heart disease Father    Breast cancer Paternal Grandmother    Early death Brother    ADD / ADHD Daughter    Cancer - Colon Neg Hx    Gastric cancer Neg Hx    Esophageal cancer Neg Hx  Liver cancer Neg Hx    Autoimmune disease Neg Hx    Social History   Socioeconomic History   Marital status: Married    Spouse name: Not on file   Number of children: Not on file   Years of education: Not on file   Highest education level: Associate degree: occupational, Scientist, product/process development, or vocational program  Occupational History   Not on file  Tobacco Use   Smoking status: Former    Current packs/day: 0.50    Types: Cigarettes    Passive exposure: Past   Smokeless tobacco: Never  Vaping Use   Vaping status: Never Used  Substance and Sexual Activity   Alcohol use: Not Currently    Comment: rarely   Drug use: Never   Sexual activity: Not on file  Other Topics Concern   Not on file  Social History Narrative   Not on file   Social Drivers of Health   Financial Resource Strain: Low Risk  (02/27/2024)   Overall Financial Resource Strain (CARDIA)    Difficulty of Paying Living Expenses: Not hard at all  Food Insecurity: No Food Insecurity (02/27/2024)   Hunger Vital Sign    Worried About Running Out of Food in the Last Year: Never true    Ran Out of Food in the Last Year: Never true  Transportation Needs: No Transportation Needs (02/27/2024)   PRAPARE - Administrator, Civil Service (Medical): No    Lack of Transportation (Non-Medical): No  Physical Activity: Insufficiently Active (02/27/2024)   Exercise Vital Sign    Days of Exercise per Week: 1 day    Minutes of Exercise per Session: 30 min  Stress: Stress Concern Present  (02/27/2024)   [employer] of Occupational Health - Occupational Stress Questionnaire    Feeling of Stress: Rather much  Social Connections: Moderately Integrated (02/27/2024)   Social Connection and Isolation Panel    Frequency of Communication with Friends and Family: More than three times a week    Frequency of Social Gatherings with Friends and Family: More than three times a week    Attends Religious Services: 1 to 4 times per year    Active Member of [employer] or Organizations: No    Attends Engineer, structural: Not on file    Marital Status: Married    Tobacco Counseling Counseling given: Yes    Clinical Intake:  Pre-visit preparation completed: Yes  Pain : 0-10 Pain Score: 5  Pain Type: Chronic pain Pain Location: Other (Comment) (joints) Pain Orientation: Other (Comment) (all joints) Pain Descriptors / Indicators: Constant Pain Onset: More than a month ago Pain Frequency: Constant     BMI - recorded: 31.89 Nutritional Status: BMI > 30  Obese Nutritional Risks: None Diabetes: No  Lab Results  Component Value Date   HGBA1C 5.9 (A) 04/06/2024   HGBA1C 6.6 (A) 12/16/2023   HGBA1C 6.6 (A) 09/15/2023     How often do you need to have someone help you when you read instructions, pamphlets, or other written materials from your doctor or pharmacy?: 1 - Never  Interpreter Needed?: No  Information entered by :: [facility]   Activities of Daily Living     04/19/2024    3:09 PM 09/25/2023    1:10 PM  In your present state of health, do you have any difficulty performing the following activities:  Hearing? 0 0  Vision? 0 0  Difficulty concentrating or making decisions? 0 1  Walking or climbing  stairs? 0 1  Dressing or bathing? 0 1  Doing errands, shopping? 1 1  Preparing Food and eating ? N N  Using the Toilet? N N  In the past six months, have you accidently leaked urine? Y Y  Do you have problems with loss of bowel control? N N  Managing  your Medications? N N  Managing your Finances? N N  Housekeeping or managing your Housekeeping? CINDERELLA CINDERELLA    Patient Care Team: Tobie Suzzane POUR, MD as PCP - General (Internal Medicine) Cindie Carlin POUR, DO as Consulting Physician (Gastroenterology) Dolphus Reiter, MD as Consulting Physician (Rheumatology) Therisa Benton PARAS, NP as Nurse Practitioner (Endocrinology) Rudy Josette GORMAN DEVONNA as Physician Assistant (Gastroenterology) Margrette Taft BRAVO, MD as Consulting Physician (Orthopedic Surgery) Okey Vina GAILS, MD as Consulting Physician (Cardiology) Octavia Bruckner, MD as Consulting Physician (Ophthalmology)  I have updated your Care Teams any recent Medical Services you may have received from other providers in the past year.     Assessment:   This is a routine wellness examination for Deklyn.  Hearing/Vision screen Hearing Screening - Comments:: Patient denies any hearing difficulties.   Vision Screening - Comments:: Wears rx glasses - up to date with routine eye exams with  [facility]   Goals Addressed             This Visit's Progress    Patient Stated       I want to lose weight to have less joint pain with arthritis and a better A1C.        Depression Screen     03/01/2024    3:41 PM 10/16/2023    1:14 PM 06/16/2023   10:21 AM 05/05/2023   10:38 AM 01/01/2023    3:17 PM 10/02/2022    2:49 PM 07/02/2022    3:11 PM  PHQ 2/9 Scores  PHQ - 2 Score 3 0 4 0 5 2 3   PHQ- 9 Score 17 0 16  20 13 18     Fall Risk     04/19/2024    3:09 PM 03/01/2024    3:39 PM 10/16/2023    1:14 PM 09/25/2023    1:10 PM 06/16/2023   10:21 AM  Fall Risk   Falls in the past year? 0 0 0 1 1  Number falls in past yr: 0 0 0 1 1  Injury with Fall? 0 0 0 1 1  Risk for fall due to : Orthopedic patient No Fall Risks No Fall Risks    Follow up Falls evaluation completed;Education provided;Falls prevention discussed Falls evaluation completed Falls evaluation completed      MEDICARE  RISK AT HOME:  Medicare Risk at Home Any stairs in or around the home?: (Patient-Rptd) Yes If so, are there any without handrails?: (Patient-Rptd) Yes Home free of loose throw rugs in walkways, pet beds, electrical cords, etc?: (Patient-Rptd) Yes Adequate lighting in your home to reduce risk of falls?: (Patient-Rptd) Yes Life alert?: (Patient-Rptd) No Use of a cane, walker or w/c?: (Patient-Rptd) No Grab bars in the bathroom?: (Patient-Rptd) Yes Shower chair or bench in shower?: (Patient-Rptd) Yes Elevated toilet seat or a handicapped toilet?: (Patient-Rptd) Yes  TIMED UP AND GO:  Was the test performed?  No  Cognitive Function: 6CIT completed        04/21/2024    3:46 PM  6CIT Screen  What Year? 0 points  What month? 0 points  What time? 0 points  Count back from 20 0 points  Months in reverse 0 points  Repeat phrase 0 points  Total Score 0 points    Immunizations Immunization History  Administered Date(s) Administered   Fluad Quad(high Dose 65+) 07/02/2021, 06/05/2022   Fluad Trivalent(High Dose 65+) 06/16/2023   Influenza Split 07/26/2014   Influenza, High Dose Seasonal PF 09/22/2018   Influenza-Unspecified 07/26/2014   PFIZER(Purple Top)SARS-COV-2 Vaccination 12/01/2019, 12/29/2019   Pneumococcal Conjugate-13 08/02/2019   Pneumococcal Polysaccharide-23 07/28/2020   Tdap 09/30/2016   Zoster Recombinant(Shingrix) 06/03/2018, 11/10/2019    Screening Tests Health Maintenance  Topic Date Due   COVID-19 Vaccine (3 - Pfizer risk series) 01/26/2020   INFLUENZA VACCINE  04/16/2024   Diabetic kidney evaluation - eGFR measurement  06/15/2024   OPHTHALMOLOGY EXAM  06/23/2024   HEMOGLOBIN A1C  10/07/2024   Diabetic kidney evaluation - Urine ACR  10/15/2024   FOOT EXAM  10/15/2024   MAMMOGRAM  10/22/2024   Medicare Annual Wellness (AWV)  04/21/2025   DEXA SCAN  10/22/2025   DTaP/Tdap/Td (2 - Td or Tdap) 09/30/2026   Colonoscopy  07/10/2028   Pneumococcal Vaccine: 50+  Years  Completed   Hepatitis C Screening  Completed   Zoster Vaccines- Shingrix  Completed   Hepatitis B Vaccines  Aged Out   HPV VACCINES  Aged Out   Meningococcal B Vaccine  Aged Out    Health Maintenance  Health Maintenance Due  Topic Date Due   COVID-19 Vaccine (3 - Pfizer risk series) 01/26/2020   INFLUENZA VACCINE  04/16/2024   Health Maintenance Items Addressed: Routine health screenings are up to date. Patient is aware of recommended vaccines.   Additional Screening:  Vision Screening: Recommended annual ophthalmology exams for early detection of glaucoma and other disorders of the eye. Would you like a referral to an eye doctor? No    Dental Screening: Recommended annual dental exams for proper oral hygiene  Community Resource Referral / Chronic Care Management: CRR required this visit?  No   CCM required this visit?  No   Plan:    I have personally reviewed and noted the following in the patient's chart:   Medical and social history Use of alcohol, tobacco or illicit drugs  Current medications and supplements including opioid prescriptions. Patient is not currently taking opioid prescriptions. Functional ability and status Nutritional status Physical activity Advanced directives List of other physicians Hospitalizations, surgeries, and ER visits in previous 12 months Vitals Screenings to include cognitive, depression, and falls Referrals and appointments  In addition, I have reviewed and discussed with patient certain preventive protocols, quality metrics, and best practice recommendations. A written personalized care plan for preventive services as well as general preventive health recommendations were provided to patient.   Rekita Miotke, CMA   04/21/2024   After Visit Summary: (MyChart) Due to this being a telephonic visit, the after visit summary with patients personalized plan was offered to patient via MyChart   Notes: Nothing significant to report  at this time.

## 2024-04-26 DIAGNOSIS — G4733 Obstructive sleep apnea (adult) (pediatric): Secondary | ICD-10-CM | POA: Diagnosis not present

## 2024-04-26 DIAGNOSIS — R400 Somnolence: Secondary | ICD-10-CM | POA: Diagnosis not present

## 2024-04-27 NOTE — Progress Notes (Signed)
 Office Visit Note  Patient: Monique Warren             Date of Birth: 09-09-1951           MRN: [id number]             PCP: Tobie Suzzane POUR, MD Referring: Tobie Suzzane POUR, MD Visit Date: 05/11/2024 Occupation: @GUAROCC @  Subjective:  Pain in multiple joints  History of Present Illness: Monique Warren is a 73 y.o. female with polyarthralgia and positive ANA.  She returns today after her initial visit in July 2025.  She states she continues to have pain and discomfort in her neck, lower back, her shoulders, hands, knees and her feet.  She states she has decreased grip strength.  She has not noticed any joint swelling.  She continues to have dry mouth and dry eye symptoms.  She denies any history of oral ulcers, nasal ulcers, malar rash or photosensitivity.  She denies any shortness of breath.  She sees cardiologist for palpitations.    Activities of Daily Living:  Patient reports morning stiffness for 15-20 minutes.   Patient Reports nocturnal pain.  Difficulty dressing/grooming: Denies Difficulty climbing stairs: Reports Difficulty getting out of chair: Reports Difficulty using hands for taps, buttons, cutlery, and/or writing: Reports  Review of Systems  Constitutional:  Positive for fatigue.  HENT:  Positive for mouth dryness. Negative for mouth sores.   Eyes:  Positive for dryness.  Respiratory:  Positive for shortness of breath.   Cardiovascular:  Positive for chest pain. Negative for palpitations.  Gastrointestinal:  Positive for constipation. Negative for blood in stool and diarrhea.  Endocrine: Negative for increased urination.  Genitourinary:  Negative for involuntary urination.  Musculoskeletal:  Positive for joint pain, gait problem, joint pain, myalgias, muscle weakness, morning stiffness and myalgias. Negative for joint swelling and muscle tenderness.  Skin:  Negative for color change, rash, hair loss and sensitivity to sunlight.  Allergic/Immunologic: Positive  for susceptible to infections.  Neurological:  Positive for dizziness and headaches.  Hematological:  Negative for swollen glands.  Psychiatric/Behavioral:  Positive for depressed mood and sleep disturbance. The patient is nervous/anxious.     PMFS History:  Patient Active Problem List   Diagnosis Date Noted   Insomnia 02/23/2024   Obesity 02/23/2024   Polyarthralgia 11/03/2023   Acute non-recurrent maxillary sinusitis 10/16/2023   PVD (peripheral vascular disease) (HCC) 07/25/2023   Bursitis of left shoulder 06/16/2023   Allergic dermatitis 06/16/2023   Blurry vision, bilateral 06/16/2023   COVID-19 05/02/2023   Primary osteoarthritis of right knee 12/06/2022   Status post total right knee replacement 12/06/2022   Chronic fatigue 10/02/2022   Acute pain of right shoulder 07/05/2022   S/P left knee arthroscopy 04/09/22 04/15/2022   Acute medial meniscus tear of left knee    Tear of lateral meniscus of left knee    Encounter for general adult medical examination with abnormal findings 04/01/2022   Dysphagia 01/16/2022   Gastroesophageal reflux disease 01/16/2022   Barrett's esophagus without dysplasia 01/16/2022   Chronic idiopathic constipation 01/16/2022   Fatty liver 01/16/2022   History of colonic polyps 01/16/2022   Constipation 10/02/2021   Bilateral thumb pain 08/14/2021   Chest pain 07/02/2021   Hernia of abdominal cavity 06/12/2021   Vertigo 06/12/2021   Carpal tunnel syndrome of right wrist 05/16/2021   Polyneuropathy due to type 2 diabetes mellitus (HCC) 05/16/2021   MDD (major depressive disorder), recurrent episode (HCC) 04/19/2021   HTN (hypertension)  04/19/2021   BPPV (benign paroxysmal positional vertigo) 09/25/2019   Celiac disease 09/25/2019   Complex sleep apnea syndrome 09/25/2019   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   Hyperlipidemia 10/16/2015   Asthma 09/27/2015   Migraine without aura or status migrainosus 09/27/2015   Other  intervertebral disc degeneration, lumbosacral region 01/26/2014   Age-related osteoporosis without current pathological fracture 09/16/2008   Type 2 diabetes mellitus with diabetic neuropathy, unspecified (HCC) 09/16/2008   Sliding hiatal hernia 08/29/1951    Past Medical History:  Diagnosis Date   Allergy    Anxiety    Arthritis    Asthma    Cataract    Both eyes, but have been removed   Celiac disease    Depression    Diabetes mellitus, type II (HCC)    Diabetic neuropathy (HCC)    Diastolic dysfunction    Grade 1 with preserved EF   Dysrhythmia    GERD (gastroesophageal reflux disease)    Heart murmur    Hiatal hernia    Sliding   Hyperlipidemia    Hypertension    Migraine    Obstructive sleep apnea    Osteoporosis    Sleep apnea    Vertigo     Family History  Problem Relation Age of Onset   Breast cancer Mother    Thyroid disease Mother    Stroke Mother    Arthritis Mother    Depression Mother    Hyperlipidemia Mother    Hypertension Father    Hyperlipidemia Father    Heart attack Father    Heart failure Father    Alcohol abuse Father    Heart disease Father    Early death Brother    Breast cancer Paternal Grandmother    ADD / ADHD Daughter    Cancer - Colon Neg Hx    Gastric cancer Neg Hx    Esophageal cancer Neg Hx    Liver cancer Neg Hx    Autoimmune disease Neg Hx    Past Surgical History:  Procedure Laterality Date   ABDOMINAL HYSTERECTOMY  09/29/2003   APPENDECTOMY     BALLOON DILATION N/A 02/08/2022   Procedure: BALLOON DILATION;  Surgeon: Cindie Carlin POUR, DO;  [facility];  Service: Endoscopy;  Laterality: N/A;   BIOPSY  02/08/2022   Procedure: BIOPSY;  Surgeon: Cindie Carlin POUR, DO;  [facility];  Service: Endoscopy;;   BIOPSY  07/11/2023   Procedure: BIOPSY;  Surgeon: Cindie Carlin POUR, DO;  [facility];  Service: Endoscopy;;   BRAIN SURGERY     BREAST BIOPSY Left    CARPAL TUNNEL RELEASE      CATARACT EXTRACTION Bilateral    October and November 2016   CESAREAN SECTION     COLONOSCOPY  09/2017   Ozell GORMAN Colla, MD in [place]; 5 mm tubular adenoma in the descending colon s/p resected, mild sigmoid diverticulosis, internal hemorrhoids.  Recommended repeat colonoscopy in 5 years.   COLONOSCOPY WITH PROPOFOL  N/A 07/11/2023   Surgeon: Cindie Carlin POUR, DO; Nonbleeding internal hemorrhoids, diverticulosis in the sigmoid colon, 5 mm polyp in the transverse colon resected and retrieved, 8 mm polyp in the sigmoid colon resected and retrieved, single nonbleeding angiodysplastic lesion.  Pathology with 1 tubular adenoma and 1 hyperplastic polyp.  Recommended 7-year surveillance.   CRANIOTOMY FOR ANEURYSM / VERTEBROBASILAR / CAROTID CIRCULATION Right 10/24/2015   ESOPHAGOGASTRODUODENOSCOPY  09/2017   [name] ; irregular Z-line s/p biopsy, decreased LES tone, 4 cm hiatal  hernia, erythema in gastric antrum biopsied, normal examined duodenum biopsied.  Esophageal biopsy suggestive of reflux, no Barrett's, gastric biopsy with nonspecific chronic gastritis with intestinal metaplasia, duodenal biopsy with increased intraepithelial lymphocytes without blunting of villi, nonspecific.   ESOPHAGOGASTRODUODENOSCOPY (EGD) WITH PROPOFOL  N/A 02/08/2022   Surgeon: Cindie Carlin POUR, DO; 2 cm hiatal hernia, short segment Barrett's esophagus without dysplasia, mild Schatzki's ring dilated, gastritis with biopsies benign, normal examined duodenum.  Repeat EGD in 5 years.   ESOPHAGOGASTRODUODENOSCOPY (EGD) WITH PROPOFOL  N/A 07/11/2023   Surgeon: Cindie Carlin POUR, DO;  cm hiatal hernia, mucosal changes secondary to short segment Barrett's esophagus biopsied, esophageal mucosal changes suspicious for EOE biopsied and dilated, gastritis biopsied. Gastric biopsies benign, all esophageal biopsies benign.   EYE SURGERY     JOINT REPLACEMENT     KNEE ARTHROSCOPY WITH MEDIAL MENISECTOMY Left 04/09/2022   Procedure: KNEE  ARTHROSCOPY WITH PARTIAL MEDIAL MENISCECTOMY, LATERAL MENISCAL DEBRIDEMENT;  Surgeon: Margrette Taft BRAVO, MD;  Location: [facility];  Service: Orthopedics;  Laterality: Left;   KNEE ARTHROSCOPY WITH MENISCAL REPAIR Left 04/09/2022   Procedure: KNEE ARTHROSCOPY WITH MEDIAL MENISCAL REPAIR;  Surgeon: Margrette Taft BRAVO, MD;  Location: [facility];  Service: Orthopedics;  Laterality: Left;   LAPAROSCOPY     POLYPECTOMY  07/11/2023   Procedure: POLYPECTOMY;  Surgeon: Cindie Carlin POUR, DO;  [facility];  Service: Endoscopy;;   TOTAL KNEE ARTHROPLASTY Right 12/06/2022   Procedure: TOTAL KNEE ARTHROPLASTY;  Surgeon: Margrette Taft BRAVO, MD;  Location: [facility];  Service: Orthopedics;  Laterality: Right;   TUBAL LIGATION     UTERINE FIBROID SURGERY     WRIST SURGERY Bilateral    Social History   Social History Narrative   Not on file   Immunization History  Administered Date(s) Administered   Fluad Quad(high Dose 65+) 07/02/2021, 06/05/2022   Fluad Trivalent(High Dose 65+) 06/16/2023   INFLUENZA, HIGH DOSE SEASONAL PF 09/22/2018   Influenza Split 07/26/2014   Influenza-Unspecified 07/26/2014   PFIZER(Purple Top)SARS-COV-2 Vaccination 12/01/2019, 12/29/2019   Pneumococcal Conjugate-13 08/02/2019   Pneumococcal Polysaccharide-23 07/28/2020   Tdap 09/30/2016   Zoster Recombinant(Shingrix) 06/03/2018, 11/10/2019     Objective: Vital Signs: BP 97/66 (BP Location: Right Arm, Patient Position: Sitting, Cuff Size: Normal)   Pulse 99   Resp 14   Ht 5' 2 (1.575 m)   Wt 188 lb (85.3 kg)   BMI 34.39 kg/m    Physical Exam Vitals and nursing note reviewed.  Constitutional:      Appearance: She is well-developed.  HENT:     Head: Normocephalic and atraumatic.  Eyes:     Conjunctiva/sclera: Conjunctivae normal.  Cardiovascular:     Rate and Rhythm: Normal rate and regular rhythm.     Heart sounds: Murmur heard.  Pulmonary:     Effort: Pulmonary effort is normal.     Breath sounds:  Normal breath sounds.  Abdominal:     General: Bowel sounds are normal.     Palpations: Abdomen is soft.  Musculoskeletal:     Cervical back: Normal range of motion.  Lymphadenopathy:     Cervical: No cervical adenopathy.  Skin:    General: Skin is warm and dry.     Capillary Refill: Capillary refill takes less than 2 seconds.  Neurological:     Mental Status: She is alert and oriented to person, place, and time.  Psychiatric:        Behavior: Behavior normal.      Musculoskeletal Exam: She is  good range of motion of the cervical spine.  Thoracic kyphosis was noted.  There was no discomfort range of motion of the cervical and lumbar spine.  Shoulders, elbows, wrist joints were in good range of motion.  She had bilateral CMC PIP and DIP thickening with no synovitis.  Hip joints were in good range of motion without discomfort.  Knee joints in good range of motion without any warmth swelling or effusion.  There was no tenderness over her ankles or MTPs.  Dorsal spurring was noted.  CDAI Exam: CDAI Score: -- Patient Global: --; Provider Global: -- Swollen: --; Tender: -- Joint Exam 05/11/2024   No joint exam has been documented for this visit   There is currently no information documented on the homunculus. Go to the Rheumatology activity and complete the homunculus joint exam.  Investigation: No additional findings.  Imaging: DG ESOPHAGUS W DOUBLE CM (HD) Result Date: 05/10/2024 CLINICAL DATA:  73 year old female. History of gastroesophageal reflux and Barrett's esophagus s/p esophageal dilation. Endorsing dysphagia with liquids. Request is double esophagram for further evaluation EXAM: ESOPHAGUS/BARIUM SWALLOW/TABLET STUDY TECHNIQUE: Combined double and single contrast examination was performed using effervescent crystals, high-density barium, and thin liquid barium. This exam was performed by Delon Beagle NP, and was supervised and interpreted by Dr. Wilkie Lent.  FLUOROSCOPY: Radiation Exposure Index (as provided by the fluoroscopic device): 9.2 mGy Kerma COMPARISON:  Esophagram dated May 22, 2022 FINDINGS: Swallowing: Appears normal. No vestibular penetration or aspiration seen. Pharynx: Unremarkable. Esophagus: Normal appearance. Esophageal motility: Within normal limits. Hiatal Hernia: Small to moderate hiatal hernia Gastroesophageal reflux: Low volume spontaneous gastroesophageal reflux noted to the mid third of the esophagus. Ingested 13 mm barium tablet: Passed normally Other: None. IMPRESSION: 1.  Small to moderate hiatal hernia. 2.  Low volume spontaneous gastroesophageal reflux. 3.  No masses, lesions or significant dysmotility. Electronically Signed   By: Wilkie Lent M.D.   On: 05/10/2024 12:06    Recent Labs: Lab Results  Component Value Date   WBC 7.4 06/16/2023   HGB 14.8 06/16/2023   PLT 328 06/16/2023   NA 142 06/16/2023   K 4.6 06/16/2023   CL 106 06/16/2023   CO2 22 06/16/2023   GLUCOSE 102 (H) 06/16/2023   BUN 14 06/16/2023   CREATININE 0.87 06/16/2023   BILITOT 0.3 06/16/2023   ALKPHOS 94 06/16/2023   AST 19 06/16/2023   ALT 22 06/16/2023   PROT 6.4 06/16/2023   ALBUMIN 4.1 06/16/2023   CALCIUM  9.0 06/16/2023   April 08, 2024 RNP 1.3, SCL 70 negative, Smith negative, SSA negative, SSB negative, dsDNA negative, C3-C4 normal, anti-CCP negative October 16, 2023 urine protein creatinine ratio normal November 03, 2023 ANA 1: 40 cytoplasmic, 1: 40 NS, ESR 2, uric acid 5.0, RF negative December 16, 2023 hemoglobin A1c 6.6  Speciality Comments: No specialty comments available.  Procedures:  No procedures performed Allergies: Cefuroxime, Codeine, Morphine , Sulfa antibiotics, Sulfamethoxazole-trimethoprim, and Gluten meal   Assessment / Plan:     Visit Diagnoses: Positive ANA (antinuclear antibody) - Positive ANA, positive RNP, sicca symptoms: She gives history of dry mouth and dry eyes.  There is no history of oral ulcers,  nasal ulcers, malar rash, photosensitivity, Raynaud's or lymphadenopathy.  I did detailed discussion with the patient regarding the labs.  RNP antibodies positive.  She has no shortness of breath or Raynaud's phenomenon.  I advised her to contact me if she develops any new symptoms.  I will repeat labs in 1 year.  Sicca syndrome (HCC) - Positive ANA, SSA negative, SSB negative, history of frequent cavities: She good salivary pool today.  Salivary gland massage was discussed.  Over-the-counter products were discussed.  I would avoid pilocarpine at this point.  It is dental hygiene and frequent visit to her dentist every 3 months was advised.  Polyarthralgia-she complains of pain and discomfort in multiple joints.  Clinical and radiographic findings with history of osteoarthritis.  Bursitis of left shoulder - Injected by Dr. Margrette in the past.  Primary osteoarthritis, right shoulder - She had cortisone injections in the past.  X-rays showed osteoarthritic changes in the past.  A handout on shoulder exercises was given.  Primary osteoarthritis of both hands -she complains of pain and discomfort in the bilateral hands.  Bilateral CMC PIP and DIP thickening was noted.  Clinical and radiographic findings with history of osteoarthritis.  She had bilateral CMC surgery.  Generalized osteopenia was noted.  Joint protection muscle strengthening was discussed.  A handout on hand muscle strength exercises was given.  Pain in both wrists - h/o wrist fracture after a fall RWJ 1993, LWJ 2014.  She has chronic discomfort in her wrist joints.  Chronic pain of both hips-she had good range of motion without discomfort today.  Primary osteoarthritis of left knee - Dr. Margrette discussed total knee replacement.  She continues to have lower extremity discomfort.  No warmth swelling or effusion was noted.  A handout on lower extremity exercise was given.  Status post total right knee replacement - By Dr. Margrette April  2024  Pain in both feet -she describes discomfort on the dorsum of her feet.  Dorsal spurs were noted.  Proper fitting shoes were advised.  Clinical and radiographic findings suggestive of osteoarthritis.  Neck pain -she continues to have neck discomfort.  Degenerative changes noted on previous x-rays.  Lumbar spondylosis-chronic pain.  Age-related osteoporosis without current pathological fracture - 10/23/23 The BMD measured at Femur Neck Left is 0.744 g/cm2 with a T-score of -2.1.  On Prolia  injections for the last 15 years.  Last injection on 07/ 23/25.  She gets Prolia  through her PCP.  Other medical problems are listed as follows:  Type 2 diabetes mellitus with diabetic neuropathy, without long-term current use of insulin (HCC)  Chronic fatigue  Polyneuropathy due to type 2 diabetes mellitus (HCC)  Primary hypertension  Mixed hyperlipidemia  PVD (peripheral vascular disease) (HCC)  Gastroesophageal reflux disease without esophagitis  Barrett's esophagus without dysplasia  Sliding hiatal hernia  Celiac disease  History of colonic polyps  Migraine without aura and without status migrainosus, not intractable  Moderate persistent asthma without complication  Complex sleep apnea syndrome  Other insomnia  Aneurysm of middle cerebral artery  Allergic dermatitis  Former smoker - She quit smoking 2012, smoked 1 pack/day for  50 years.  Orders: Orders Placed This Encounter  Procedures   Protein / creatinine ratio, urine   CBC with Differential/Platelet   Comprehensive metabolic panel with GFR   ANA   Anti-DNA antibody, double-stranded   C3 and C4   Sedimentation rate   RNP Antibody   Sjogrens syndrome-A extractable nuclear antibody   No orders of the defined types were placed in this encounter.   Follow-Up Instructions: Return in about 1 year (around 05/11/2025) for Osteoarthritis.   Maya Nash, MD  Note - This record has been created using [employer].  Chart creation errors have been sought, but may not always  have been located. Such creation  errors do not reflect on  the standard of medical care.

## 2024-04-28 NOTE — Progress Notes (Signed)
 Referring Provider: Tobie Suzzane POUR, MD Primary Care Physician:  Tobie Suzzane POUR, MD Primary GI Physician: Dr. Cindie  Chief Complaint  Patient presents with   Follow-up    Follow up. Having issues with constipation     HPI:   Monique Warren is a 73 y.o. female with a history of of type 2 diabetes, HTN, HLD, OSA, grade 1 diastolic dysfunction with preserved EF, possible celiac disease with duodenal  biopsies non-specific in 2019, but follow-up ttg IgA negative in September 2024 off gluten free diet and normal appearing duodenum on EGD in May 2023 and October 2024, Barrett's esophagus, GERD, dysphagia, constipation, adenomatous colon polyps, fatty liver, elevated liver enzymes previously with normalization in September 2023, presenting today follow-up.  Last seen in the office 10/20/2023.  Reported GERD symptoms improved after changing pantoprazole  40 mg twice daily to Nexium  40 mg twice daily.  She was also taking Pepcid  twice daily.  She had previously failed Aciphex .  She still had some intermittent chest discomfort after meals 1-2 times a week, but considering she had significant improvement in all of her other symptoms, and this was the best that she had felt in quite some time, did not recommend changing PPI at that time.  Encouraged her to keep a dietary log of when she experienced chest discomfort to see if we could tease out any specific triggers.  Reinforced general GERD diet/lifestyle recommendations and specifically encouraged avoiding caffeine and carbonated beverages.  Today:  GERD: Having breakthrough symptoms most evenings.  Taking Nexium  40 mg BID before meals and famotidine  BID.   Dysphagia:  Water goes down slow daily. Got a pea stuck recently and had to cough and cough to get it back up.   Constipation: Having trouble with constipation. Stooped taking Dose. Forgets as it isn't something she can put in her pill box.  Resumed Linzess  72 mcg daily about 1 month ago,  but isn't strong enough. Skipping about 1 week without a BM. Having associated abdominal discomfort. No brbpr or melena.     Last Colonoscopy 07/11/2023: Nonbleeding internal hemorrhoids, diverticulosis in the sigmoid colon, 5 mm polyp in the transverse colon resected and retrieved, 8 mm polyp in the sigmoid colon resected and retrieved, single nonbleeding angiodysplastic lesion.  Pathology with 1 tubular adenoma and 1 hyperplastic polyp.  Recommended 7-year surveillance.   Last EGD 07/11/2023: 2 cm hiatal hernia, mucosal changes secondary to short segment Barrett's esophagus biopsied, esophageal mucosal changes suspicious for EOE biopsied and dilated, gastritis biopsied.  Recommended PPI twice daily.  Gastric biopsies benign, all esophageal biopsies benign.     Past Medical History:  Diagnosis Date   Allergy    Anxiety    Arthritis    Asthma    Cataract    Both eyes, but have been removed   Celiac disease    Depression    Diabetes mellitus, type II (HCC)    Diabetic neuropathy (HCC)    Diastolic dysfunction    Grade 1 with preserved EF   Dysrhythmia    GERD (gastroesophageal reflux disease)    Heart murmur    Hiatal hernia    Sliding   Hyperlipidemia    Hypertension    Migraine    Obstructive sleep apnea    Osteoporosis    Sleep apnea    Vertigo     Past Surgical History:  Procedure Laterality Date   ABDOMINAL HYSTERECTOMY  09/29/2003   APPENDECTOMY     BALLOON DILATION N/A 02/08/2022  Procedure: BALLOON DILATION;  Surgeon: Cindie Carlin POUR, DO;  [facility];  Service: Endoscopy;  Laterality: N/A;   BIOPSY  02/08/2022   Procedure: BIOPSY;  Surgeon: Cindie Carlin POUR, DO;  [facility];  Service: Endoscopy;;   BIOPSY  07/11/2023   Procedure: BIOPSY;  Surgeon: Cindie Carlin POUR, DO;  [facility];  Service: Endoscopy;;   BRAIN SURGERY     BREAST BIOPSY Left    CARPAL TUNNEL RELEASE     CATARACT EXTRACTION Bilateral    October and  November 2016   CESAREAN SECTION     COLONOSCOPY  09/2017   Ozell GORMAN Colla, MD in [place]; 5 mm tubular adenoma in the descending colon s/p resected, mild sigmoid diverticulosis, internal hemorrhoids.  Recommended repeat colonoscopy in 5 years.   COLONOSCOPY WITH PROPOFOL  N/A 07/11/2023   Surgeon: Cindie Carlin POUR, DO; Nonbleeding internal hemorrhoids, diverticulosis in the sigmoid colon, 5 mm polyp in the transverse colon resected and retrieved, 8 mm polyp in the sigmoid colon resected and retrieved, single nonbleeding angiodysplastic lesion.  Pathology with 1 tubular adenoma and 1 hyperplastic polyp.  Recommended 7-year surveillance.   CRANIOTOMY FOR ANEURYSM / VERTEBROBASILAR / CAROTID CIRCULATION Right 10/24/2015   ESOPHAGOGASTRODUODENOSCOPY  09/2017   [name] ; irregular Z-line s/p biopsy, decreased LES tone, 4 cm hiatal hernia, erythema in gastric antrum biopsied, normal examined duodenum biopsied.  Esophageal biopsy suggestive of reflux, no Barrett's, gastric biopsy with nonspecific chronic gastritis with intestinal metaplasia, duodenal biopsy with increased intraepithelial lymphocytes without blunting of villi, nonspecific.   ESOPHAGOGASTRODUODENOSCOPY (EGD) WITH PROPOFOL  N/A 02/08/2022   Surgeon: Cindie Carlin POUR, DO; 2 cm hiatal hernia, short segment Barrett's esophagus without dysplasia, mild Schatzki's ring dilated, gastritis with biopsies benign, normal examined duodenum.  Repeat EGD in 5 years.   ESOPHAGOGASTRODUODENOSCOPY (EGD) WITH PROPOFOL  N/A 07/11/2023   Surgeon: Cindie Carlin POUR, DO;  cm hiatal hernia, mucosal changes secondary to short segment Barrett's esophagus biopsied, esophageal mucosal changes suspicious for EOE biopsied and dilated, gastritis biopsied. Gastric biopsies benign, all esophageal biopsies benign.   EYE SURGERY     JOINT REPLACEMENT     KNEE ARTHROSCOPY WITH MEDIAL MENISECTOMY Left 04/09/2022   Procedure: KNEE ARTHROSCOPY WITH PARTIAL MEDIAL MENISCECTOMY,  LATERAL MENISCAL DEBRIDEMENT;  Surgeon: Margrette Taft BRAVO, MD;  Location: [facility];  Service: Orthopedics;  Laterality: Left;   KNEE ARTHROSCOPY WITH MENISCAL REPAIR Left 04/09/2022   Procedure: KNEE ARTHROSCOPY WITH MEDIAL MENISCAL REPAIR;  Surgeon: Margrette Taft BRAVO, MD;  Location: [facility];  Service: Orthopedics;  Laterality: Left;   LAPAROSCOPY     POLYPECTOMY  07/11/2023   Procedure: POLYPECTOMY;  Surgeon: Cindie Carlin POUR, DO;  [facility];  Service: Endoscopy;;   TOTAL KNEE ARTHROPLASTY Right 12/06/2022   Procedure: TOTAL KNEE ARTHROPLASTY;  Surgeon: Margrette Taft BRAVO, MD;  Location: [facility];  Service: Orthopedics;  Laterality: Right;   TUBAL LIGATION     UTERINE FIBROID SURGERY     WRIST SURGERY Bilateral     Current Outpatient Medications  Medication Sig Dispense Refill   acetaminophen  (TYLENOL ) 500 MG tablet Take 1 tablet (500 mg total) by mouth every 6 (six) hours as needed for moderate pain. 30 tablet 0   AIMOVIG  140 MG/ML SOAJ INJECT 1 PEN UNDER THE SKIN 1 TIME MONTHLY 1 mL 11   albuterol  (VENTOLIN  HFA) 108 (90 Base) MCG/ACT inhaler USE 2 INHALATIONS EVERY 6 HOURS AS NEEDED FOR WHEEZING OR SHORTNESS OF BREATH 8.5 g 10  atorvastatin  (LIPITOR) 40 MG tablet TAKE 1 TABLET AT BEDTIME 90 tablet 3   Blood Glucose Monitoring Suppl (ONE TOUCH ULTRA 2) w/Device KIT USE TO CHECK BLOOD GLUCOSE 2 TIMES A DAY 1 kit 0   Cholecalciferol  (VITAMIN D3) 125 MCG (5000 UT) capsule Take 5,000 Units by mouth daily.     diltiazem  (CARDIZEM  SR) 120 MG 12 hr capsule Take 2 capsules (240 mg total) by mouth daily. 180 capsule 3   DULoxetine  (CYMBALTA ) 60 MG capsule TAKE 1 CAPSULE DAILY 90 capsule 3   esomeprazole  (NEXIUM ) 40 MG capsule TAKE 1 CAPSULE BY MOUTH TWICE DAILY BEFORE A MEAL 60 capsule 5   famotidine  (PEPCID ) 40 MG tablet TAKE 1 TABLET DAILY 90 tablet 3   Fluticasone -Salmeterol,sensor, (AIRDUO DIGIHALER ) 232-14 MCG/ACT AEPB Inhale 1 Inhalation into the lungs 2 (two) times daily. USE  1 INHALATION TWICE A DAY Strength: 232-14 MCG/ACT 1 each 11   gabapentin  (NEURONTIN ) 300 MG capsule TAKE 1 CAPSULE AT BEDTIME 90 capsule 3   Lancets (ONETOUCH DELICA PLUS LANCET33G) MISC Use to check glucose once daily 100 each 6   linaclotide  (LINZESS ) 72 MCG capsule Take 1 capsule (72 mcg total) by mouth daily before breakfast. 90 capsule 1   meclizine  (ANTIVERT ) 25 MG tablet Take 1 tablet (25 mg total) by mouth 3 (three) times daily as needed for dizziness. 30 tablet 0   methocarbamol  (ROBAXIN ) 500 MG tablet Take 500 mg by mouth 4 (four) times daily.     Multiple Vitamin (MULTIVITAMIN WITH MINERALS) TABS tablet Take 1 tablet by mouth daily.     ONETOUCH ULTRA test strip Use to monitor glucose once daily 100 each 6   PROLIA  60 MG/ML SOSY injection INJECT 60 MG UNDER THE SKIN EVERY 6 MONTHS 1 mL 1   promethazine  (PHENERGAN ) 12.5 MG tablet Take 1 tablet (12.5 mg total) by mouth every 6 (six) hours as needed for nausea or vomiting. 30 tablet 0   Semaglutide , 2 MG/DOSE, 8 MG/3ML SOPN Inject 2 mg as directed once a week. 9 mL 3   traMADol  (ULTRAM ) 50 MG tablet TAKE 1 TABLET BY MOUTH EVERY 12 HOURS AS NEEDED 20 tablet 0   venlafaxine  (EFFEXOR ) 75 MG tablet Take 1 tablet (75 mg total) by mouth 2 (two) times daily. 180 tablet 1   Estradiol  10 MCG TABS vaginal tablet Place 10 mcg vaginally every Monday. (Patient not taking: Reported on 04/29/2024)     triamcinolone  cream (KENALOG ) 0.1 % Apply 1 Application topically 2 (two) times daily. (Patient not taking: Reported on 04/29/2024) 30 g 0   zaleplon  (SONATA ) 5 MG capsule Take 1 capsule (5 mg total) by mouth at bedtime as needed for sleep. (Patient not taking: Reported on 04/21/2024) 30 capsule 0   zolmitriptan (ZOMIG) 5 MG tablet Take 5 mg by mouth daily as needed for migraine.     Current Facility-Administered Medications  Medication Dose Route Frequency Provider Last Rate Last Admin   bupivacaine -meloxicam  ER (ZYNRELEF ) injection 400 mg  400 mg  Infiltration Once Harrison, Stanley E, MD       bupivacaine -meloxicam  ER (ZYNRELEF ) injection 400 mg  400 mg Infiltration Once Margrette Taft BRAVO, MD       denosumab  (PROLIA ) injection 60 mg  60 mg Subcutaneous Q6 months Patel, Rutwik K, MD   60 mg at 04/07/24 1435    Allergies as of 04/29/2024 - Review Complete 04/29/2024  Allergen Reaction Noted   Cefuroxime Anaphylaxis, Hives, Shortness Of Breath, Swelling, and Anxiety 09/16/2006   Codeine  Hives, Shortness Of Breath, Nausea And Vomiting, Swelling, and Anxiety 09/16/2006   Morphine  Anaphylaxis 07/09/2023   Sulfa antibiotics Anaphylaxis, Hives, Nausea And Vomiting, and Swelling 09/02/2017   Sulfamethoxazole-trimethoprim Shortness Of Breath, Swelling, and Anxiety 09/16/2006   Gluten meal  02/05/2022    Family History  Problem Relation Age of Onset   Breast cancer Mother    Thyroid disease Mother    Stroke Mother    Arthritis Mother    Depression Mother    Hyperlipidemia Mother    Hypertension Father    Hyperlipidemia Father    Heart attack Father    Heart failure Father    Alcohol abuse Father    Heart disease Father    Breast cancer Paternal Grandmother    Early death Brother    ADD / ADHD Daughter    Cancer - Colon Neg Hx    Gastric cancer Neg Hx    Esophageal cancer Neg Hx    Liver cancer Neg Hx    Autoimmune disease Neg Hx     Social History   Socioeconomic History   Marital status: Married    Spouse name: Not on file   Number of children: Not on file   Years of education: Not on file   Highest education level: Associate degree: occupational, Scientist, product/process development, or vocational program  Occupational History   Not on file  Tobacco Use   Smoking status: Former    Current packs/day: 0.50    Types: Cigarettes    Passive exposure: Past   Smokeless tobacco: Never  Vaping Use   Vaping status: Never Used  Substance and Sexual Activity   Alcohol use: Not Currently    Comment: rarely   Drug use: Never   Sexual activity:  Not on file  Other Topics Concern   Not on file  Social History Narrative   Not on file   Social Drivers of Health   Financial Resource Strain: Low Risk  (04/21/2024)   Overall Financial Resource Strain (CARDIA)    Difficulty of Paying Living Expenses: Not hard at all  Food Insecurity: No Food Insecurity (04/21/2024)   Hunger Vital Sign    Worried About Running Out of Food in the Last Year: Never true    Ran Out of Food in the Last Year: Never true  Transportation Needs: No Transportation Needs (04/21/2024)   PRAPARE - Administrator, Civil Service (Medical): No    Lack of Transportation (Non-Medical): No  Physical Activity: Inactive (04/21/2024)   Exercise Vital Sign    Days of Exercise per Week: 0 days    Minutes of Exercise per Session: 0 min  Stress: Stress Concern Present (04/21/2024)   [employer] of Occupational Health - Occupational Stress Questionnaire    Feeling of Stress: To some extent  Social Connections: Moderately Integrated (04/21/2024)   Social Connection and Isolation Panel    Frequency of Communication with Friends and Family: More than three times a week    Frequency of Social Gatherings with Friends and Family: More than three times a week    Attends Religious Services: More than 4 times per year    Active Member of [employer] or Organizations: No    Attends Banker Meetings: Never    Marital Status: Married    Review of Systems: Gen: Denies fever, chills, cold or flulike symptoms, presyncope, syncope. GI: See HPI Heme: See HPI  Physical Exam: BP 135/72 (BP Location: Right Arm, Patient Position: Sitting, Cuff Size: Large)  Pulse 96   Temp 97.9 F (36.6 C) (Temporal)   Ht 5' 2 (1.575 m)   Wt 190 lb (86.2 kg)   BMI 34.75 kg/m  General:   Alert and oriented. No distress noted. Pleasant and cooperative.  Head:  Normocephalic and atraumatic. Eyes:  Conjuctiva clear without scleral icterus. Heart:  S1, S2 present without murmurs  appreciated. Lungs:  Clear to auscultation bilaterally. No wheezes, rales, or rhonchi. No distress.  Abdomen:  +BS, soft, obese. Mild generalized TTP. No rebound or guarding. No HSM or masses noted. Msk:  Symmetrical without gross deformities. Normal posture. Extremities:  Without edema. Neurologic:  Alert and  oriented x4 Psych:  Normal mood and affect.    Assessment:  GERD:  Chronic.  Not adequately controlled with Nexium  40 mg twice daily and famotidine  twice daily. Having breakthrough symptoms daily.  Previously failed pantoprazole  40 mg twice daily and Aciphex  20 mg daily.  Will plan to try her on Voquezna 20 mg daily.   Dysphagia: This has been a chronic intermittent issue.  EGD in October 2024 for with 2 cm hiatal hernia, Barrett's esophagus, and esophagus empirically dilated.  Currently reporting that she feels liquids are going down her esophagus slowly and recently a pea got stuck where she can to cough it back up. As EGD was less than 1 year ago, will update barium pill esophagram.  It is possible that uncontrolled GERD is contributing to her dysphagia.  Constipation: Chronic.  Not adequately managed with Linzess  72 mcg daily.  Recommend completing MiraLAX  bowel prep to give her more immediate relief as she has associated abdominal discomfort currently due to not having a bowel movement in about 5 days.  After completing MiraLAX  bowel prep, recommended taking 2 Linzess  72 mcg capsules each morning and letting me know after 1 week if this is working well for her so I can send an updated prescription of Linzess  145 mcg.  If increased dose of Linzess  is not helpful, we can increase further to 290 mcg daily.   Plan:  Barium pill esophagram Stop Nexium  Start Voquezna 20 mg daily.  Samples provided.  Requested progress report in 1 week. Follow a GERD diet:  Avoid fried, fatty, greasy, spicy, citrus foods. Avoid caffeine and carbonated beverages. Avoid chocolate. Try eating 4-6 small  meals a day rather than 3 large meals. Do not eat within 3 hours of laying down. Prop head of bed up on wood or bricks to create a 6 inch incline. Complete MiraLAX  bowel prep.  Separate instructions were provided. After completing MiraLAX  bowel prep, start taking 2 Linzess  72 mcg capsules in the morning, 30 minutes before breakfast.  Patient will let me know after 1 week if this dose is working well for her so I can send in an updated prescription.  Can increase further if needed. Follow-up in 8 weeks or sooner if needed.   Josette Centers, PA-C [facility] 04/29/2024

## 2024-04-29 ENCOUNTER — Ambulatory Visit: Admitting: Gastroenterology

## 2024-04-29 ENCOUNTER — Encounter: Payer: Self-pay | Admitting: Gastroenterology

## 2024-04-29 ENCOUNTER — Encounter: Payer: Self-pay | Admitting: *Deleted

## 2024-04-29 VITALS — BP 135/72 | HR 96 | Temp 97.9°F | Ht 62.0 in | Wt 190.0 lb

## 2024-04-29 DIAGNOSIS — R1310 Dysphagia, unspecified: Secondary | ICD-10-CM

## 2024-04-29 DIAGNOSIS — K5909 Other constipation: Secondary | ICD-10-CM

## 2024-04-29 DIAGNOSIS — K5900 Constipation, unspecified: Secondary | ICD-10-CM

## 2024-04-29 DIAGNOSIS — K219 Gastro-esophageal reflux disease without esophagitis: Secondary | ICD-10-CM

## 2024-04-29 NOTE — Patient Instructions (Signed)
 For your swallowing trouble:  Will get you scheduled for a swallow evaluation at [facility].  For reflux:  Stop Nexium  Start Voquezna 20 mg daily.  We are providing you with samples today.  Please call next week to let us  know how the medication works for you.  Follow a GERD diet:  Avoid fried, fatty, greasy, spicy, citrus foods. Avoid caffeine and carbonated beverages. Avoid chocolate. Try eating 4-6 small meals a day rather than 3 large meals. Do not eat within 3 hours of laying down. Prop head of bed up on wood or bricks to create a 6 inch incline.   For constipation: Complete a MiraLAX  bowel prep.  Instructions provided below. After you complete the MiraLAX  bowel prep, start taking 2 Linzess  72 mcg in the morning, 30 minutes before breakfast.  If this works well, let me know and I will send an updated prescription for you.  If this does not work well, we can try an even higher dose of Linzess .   We will plan to see you back in about 8 weeks or sooner if needed.  Josette Centers, PA-C [facility]   MIRALAX  PREP INSTRUCTION SHEET Purchase:  MIRALAX  238 gram bottle,  1 box of DULCOLAX (All over the counter medications)  1 Day prior to starting prep:  START CLEAR LIQUID DIET AFTER YOUR LUNCH MEAL  Day of prep  CLEAR LIQUIDS ALL DAY-  At 10:00 AM, take 2 DULCOLAX 5mg  tablets  At 12:00 PM, Mix 5 teaspoons of Miralax  in any 4-6 ounces of CLEAR LIQUIDS (Gatorade) every hour for 5 hours until passing clear, watery stools. Be sure to drink 4 ounces of clear liquid 30 minutes after each dose of Miralax .   At 3:00 PM, take 2 Dulcolax 5mg  tablets  If stools are not clear & watery by 6:00 PM, take 5 teaspoons of Miralax  every 30 minutes until stools are clear (no color)

## 2024-05-07 ENCOUNTER — Other Ambulatory Visit (HOSPITAL_COMMUNITY)

## 2024-05-10 ENCOUNTER — Ambulatory Visit (HOSPITAL_COMMUNITY)
Admission: RE | Admit: 2024-05-10 | Discharge: 2024-05-10 | Disposition: A | Discharge status: Home / Self Care | Source: Ambulatory Visit | Attending: Gastroenterology | Admitting: Gastroenterology

## 2024-05-10 ENCOUNTER — Ambulatory Visit: Payer: Self-pay | Admitting: Gastroenterology

## 2024-05-10 ENCOUNTER — Other Ambulatory Visit: Payer: Self-pay | Admitting: Gastroenterology

## 2024-05-10 DIAGNOSIS — K219 Gastro-esophageal reflux disease without esophagitis: Secondary | ICD-10-CM

## 2024-05-10 DIAGNOSIS — R1310 Dysphagia, unspecified: Secondary | ICD-10-CM | POA: Diagnosis not present

## 2024-05-10 DIAGNOSIS — K449 Diaphragmatic hernia without obstruction or gangrene: Secondary | ICD-10-CM | POA: Diagnosis not present

## 2024-05-10 DIAGNOSIS — K2270 Barrett's esophagus without dysplasia: Secondary | ICD-10-CM | POA: Diagnosis not present

## 2024-05-10 MED ORDER — VOQUEZNA 20 MG PO TABS: 20.0000 mg | ORAL_TABLET | Freq: Every day | ORAL | 11 refills | Status: AC

## 2024-05-11 ENCOUNTER — Encounter: Payer: Self-pay | Admitting: Rheumatology

## 2024-05-11 ENCOUNTER — Ambulatory Visit: Payer: Medicare HMO | Attending: Rheumatology | Admitting: Rheumatology

## 2024-05-11 VITALS — BP 97/66 | HR 99 | Resp 14 | Ht 62.0 in | Wt 188.0 lb

## 2024-05-11 DIAGNOSIS — I739 Peripheral vascular disease, unspecified: Secondary | ICD-10-CM

## 2024-05-11 DIAGNOSIS — R768 Other specified abnormal immunological findings in serum: Secondary | ICD-10-CM | POA: Diagnosis not present

## 2024-05-11 DIAGNOSIS — M542 Cervicalgia: Secondary | ICD-10-CM

## 2024-05-11 DIAGNOSIS — M79671 Pain in right foot: Secondary | ICD-10-CM | POA: Diagnosis not present

## 2024-05-11 DIAGNOSIS — M3500 Sicca syndrome, unspecified: Secondary | ICD-10-CM | POA: Diagnosis not present

## 2024-05-11 DIAGNOSIS — M25551 Pain in right hip: Secondary | ICD-10-CM

## 2024-05-11 DIAGNOSIS — J4540 Moderate persistent asthma, uncomplicated: Secondary | ICD-10-CM

## 2024-05-11 DIAGNOSIS — M79672 Pain in left foot: Secondary | ICD-10-CM

## 2024-05-11 DIAGNOSIS — M810 Age-related osteoporosis without current pathological fracture: Secondary | ICD-10-CM

## 2024-05-11 DIAGNOSIS — M19041 Primary osteoarthritis, right hand: Secondary | ICD-10-CM | POA: Diagnosis not present

## 2024-05-11 DIAGNOSIS — I671 Cerebral aneurysm, nonruptured: Secondary | ICD-10-CM

## 2024-05-11 DIAGNOSIS — G8929 Other chronic pain: Secondary | ICD-10-CM

## 2024-05-11 DIAGNOSIS — E1142 Type 2 diabetes mellitus with diabetic polyneuropathy: Secondary | ICD-10-CM

## 2024-05-11 DIAGNOSIS — M25532 Pain in left wrist: Secondary | ICD-10-CM

## 2024-05-11 DIAGNOSIS — G4709 Other insomnia: Secondary | ICD-10-CM

## 2024-05-11 DIAGNOSIS — I10 Essential (primary) hypertension: Secondary | ICD-10-CM

## 2024-05-11 DIAGNOSIS — Z87891 Personal history of nicotine dependence: Secondary | ICD-10-CM

## 2024-05-11 DIAGNOSIS — M1712 Unilateral primary osteoarthritis, left knee: Secondary | ICD-10-CM | POA: Diagnosis not present

## 2024-05-11 DIAGNOSIS — K449 Diaphragmatic hernia without obstruction or gangrene: Secondary | ICD-10-CM

## 2024-05-11 DIAGNOSIS — K2270 Barrett's esophagus without dysplasia: Secondary | ICD-10-CM

## 2024-05-11 DIAGNOSIS — G4739 Other sleep apnea: Secondary | ICD-10-CM

## 2024-05-11 DIAGNOSIS — R5382 Chronic fatigue, unspecified: Secondary | ICD-10-CM

## 2024-05-11 DIAGNOSIS — Z860100 Personal history of colon polyps, unspecified: Secondary | ICD-10-CM

## 2024-05-11 DIAGNOSIS — M25531 Pain in right wrist: Secondary | ICD-10-CM

## 2024-05-11 DIAGNOSIS — M7552 Bursitis of left shoulder: Secondary | ICD-10-CM | POA: Diagnosis not present

## 2024-05-11 DIAGNOSIS — K219 Gastro-esophageal reflux disease without esophagitis: Secondary | ICD-10-CM

## 2024-05-11 DIAGNOSIS — G43009 Migraine without aura, not intractable, without status migrainosus: Secondary | ICD-10-CM

## 2024-05-11 DIAGNOSIS — M19042 Primary osteoarthritis, left hand: Secondary | ICD-10-CM

## 2024-05-11 DIAGNOSIS — K900 Celiac disease: Secondary | ICD-10-CM

## 2024-05-11 DIAGNOSIS — M2550 Pain in unspecified joint: Secondary | ICD-10-CM

## 2024-05-11 DIAGNOSIS — E1140 Type 2 diabetes mellitus with diabetic neuropathy, unspecified: Secondary | ICD-10-CM

## 2024-05-11 DIAGNOSIS — L239 Allergic contact dermatitis, unspecified cause: Secondary | ICD-10-CM

## 2024-05-11 DIAGNOSIS — Z96651 Presence of right artificial knee joint: Secondary | ICD-10-CM | POA: Diagnosis not present

## 2024-05-11 DIAGNOSIS — M25552 Pain in left hip: Secondary | ICD-10-CM

## 2024-05-11 DIAGNOSIS — M19011 Primary osteoarthritis, right shoulder: Secondary | ICD-10-CM

## 2024-05-11 DIAGNOSIS — M47816 Spondylosis without myelopathy or radiculopathy, lumbar region: Secondary | ICD-10-CM

## 2024-05-11 DIAGNOSIS — E782 Mixed hyperlipidemia: Secondary | ICD-10-CM

## 2024-05-11 NOTE — Patient Instructions (Signed)
 Exercises for Chronic Knee Pain Chronic knee pain is pain that lasts longer than 3 months. For most people with chronic knee pain, exercise and weight loss is an important part of treatment. Your health care provider may want you to focus on: Making the muscles that support your knee stronger. This can take pressure off your knee and reduce pain. Preventing knee stiffness. How far you can move your knee, keeping it there or making it farther. Losing weight (if this applies) to take pressure off your knee, lower your risk for injury, and make it easier for you to exercise. Your provider will help you make an exercise program that fits your needs and physical abilities. Below are simple, low-impact exercises you can do at home. Ask your provider or physical therapist how often you should do your exercise program and how many times to repeat each exercise. General safety tips  Get your provider's approval before doing any exercises. Start slowly and stop any time you feel pain. Do not exercise if your knee pain is flaring up. Warm up first. Stretching a cold muscle can cause an injury. Do 5-10 minutes of easy movement or light stretching before beginning your exercises. Do 5-10 minutes of low-impact activity (like walking or cycling) before starting strengthening exercises. Contact your provider any time you have pain during or after exercising. Exercise can cause discomfort but should not be painful. It is normal to be a little stiff or sore after exercising. Stretching and range-of-motion exercises Front thigh stretch  Stand up straight and support your body by holding on to a chair or resting one hand on a wall. With your legs straight and close together, bend one knee to lift your heel up toward your butt. Using one hand for support, grab your ankle with your free hand. Pull your foot up closer toward your butt to feel the stretch in front of your thigh. Hold the stretch for 30  seconds. Repeat __________ times. Complete this exercise __________ times a day. Back thigh stretch  Sit on the floor with your back straight and your legs out straight in front of you. Place the palms of your hands on the floor and slide them toward your feet as you bend at the hip. Try to touch your nose to your knees and feel the stretch in the back of your thighs. Hold for 30 seconds. Repeat __________ times. Complete this exercise __________ times a day. Calf stretch  Stand facing a wall. Place the palms of your hands flat against the wall, arms extended, and lean slightly against the wall. Get into a lunge position with one leg bent at the knee and the other leg stretched out straight behind you. Keep both feet facing the wall and increase the bend in your knee while keeping the heel of the other leg flat on the ground. You should feel the stretch in your calf. Hold for 30 seconds. Repeat __________ times. Complete this exercise __________ times a day. Strengthening exercises Straight leg lift  Lie on your back with one knee bent and the other leg out straight. Slowly lift the straight leg without bending the knee. Lift until your foot is about 12 inches (30 cm) off the floor. Hold for 3-5 seconds and slowly lower your leg. Repeat __________ times. Complete this exercise __________ times a day. Single leg dip  Stand between two chairs and put both hands on the backs of the chairs for support. Extend one leg out straight with your body  weight resting on the heel of the standing leg. Slowly bend your standing knee to dip your body to the level that is comfortable for you. Hold for 3-5 seconds. Repeat __________ times. Complete this exercise __________ times a day. Hamstring curls  Stand straight, knees close together, facing the back of a chair. Hold on to the back of a chair with both hands. Keep one leg straight. Bend the other knee while bringing the heel up toward the butt  until the knee is bent at a 90-degree angle (right angle). Hold for 3-5 seconds. Repeat __________ times. Complete this exercise __________ times a day. Wall squat  Stand straight with your back, hips, and head against a wall. Step forward one foot at a time with your back still against the wall. Your feet should be 2 feet (61 cm) from the wall at shoulder width. Keeping your back, hips, and head against the wall, slide down the wall to as close to a sitting position as you can get. Hold for 5-10 seconds, then slowly slide back up. Repeat __________ times. Complete this exercise __________ times a day. Step-ups  Stand in front of a sturdy platform or stool that is about 6 inches (15 cm) high. Slowly step up with your left / right foot, keeping your knee in line with your hip and foot. Do not let your knee bend so far that you cannot see your toes. Hold on to a chair for balance, but do not use it for support. Slowly unlock your knee and lower yourself to the starting position. Repeat __________ times. Complete this exercise __________ times a day. Contact a health care provider if: Your exercises cause pain. Your pain is worse after you exercise. Your pain prevents you from doing your exercises. This information is not intended to replace advice given to you by your health care provider. Make sure you discuss any questions you have with your health care provider. Document Revised: 09/17/2022 Document Reviewed: 09/17/2022 [employer] Patient Education  2024 [employer]Shoulder Exercises Ask your health care provider which exercises are safe for you. Do exercises exactly as told by your health care provider and adjust them as directed. It is normal to feel mild stretching, pulling, tightness, or discomfort as you do these exercises. Stop right away if you feel sudden pain or your pain gets worse. Do not begin these exercises until told by your health care provider. Stretching  exercises External rotation and abduction This exercise is sometimes called corner stretch. The exercise rotates your arm outward (external rotation) and moves your arm out from your body (abduction). Stand in a doorway with one of your feet slightly in front of the other. This is called a staggered stance. If you cannot reach your forearms to the door frame, stand facing a corner of a room. Choose one of the following positions as told by your health care provider: Place your hands and forearms on the door frame above your head. Place your hands and forearms on the door frame at the height of your head. Place your hands on the door frame at the height of your elbows. Slowly move your weight onto your front foot until you feel a stretch across your chest and in the front of your shoulders. Keep your head and chest upright and keep your abdominal muscles tight. Hold for __________ seconds. To release the stretch, shift your weight to your back foot. Repeat __________ times. Complete this exercise __________ times a day. Extension, standing  Stand  and hold a broomstick, a cane, or a similar object behind your back. Your hands should be a little wider than shoulder-width apart. Your palms should face away from your back. Keeping your elbows straight and your shoulder muscles relaxed, move the stick away from your body until you feel a stretch in your shoulders (extension). Avoid shrugging your shoulders while you move the stick. Keep your shoulder blades tucked down toward the middle of your back. Hold for __________ seconds. Slowly return to the starting position. Repeat __________ times. Complete this exercise __________ times a day. Range-of-motion exercises Pendulum  Stand near a wall or a surface that you can hold onto for balance. Bend at the waist and let your left / right arm hang straight down. Use your other arm to support you. Keep your back straight and do not lock your  knees. Relax your left / right arm and shoulder muscles, and move your hips and your trunk so your left / right arm swings freely. Your arm should swing because of the motion of your body, not because you are using your arm or shoulder muscles. Keep moving your hips and trunk so your arm swings in the following directions, as told by your health care provider: Side to side. Forward and backward. In clockwise and counterclockwise circles. Continue each motion for __________ seconds, or for as long as told by your health care provider. Slowly return to the starting position. Repeat __________ times. Complete this exercise __________ times a day. Shoulder flexion, standing  Stand and hold a broomstick, a cane, or a similar object. Place your hands a little more than shoulder-width apart on the object. Your left / right hand should be palm-up, and your other hand should be palm-down. Keep your elbow straight and your shoulder muscles relaxed. Push the stick up with your healthy arm to raise your left / right arm in front of your body, and then over your head until you feel a stretch in your shoulder (flexion). Avoid shrugging your shoulder while you raise your arm. Keep your shoulder blade tucked down toward the middle of your back. Hold for __________ seconds. Slowly return to the starting position. Repeat __________ times. Complete this exercise __________ times a day. Shoulder abduction, standing  Stand and hold a broomstick, a cane, or a similar object. Place your hands a little more than shoulder-width apart on the object. Your left / right hand should be palm-up, and your other hand should be palm-down. Keep your elbow straight and your shoulder muscles relaxed. Push the object across your body toward your left / right side. Raise your left / right arm to the side of your body (abduction) until you feel a stretch in your shoulder. Do not raise your arm above shoulder height unless your health  care provider tells you to do that. If directed, raise your arm over your head. Avoid shrugging your shoulder while you raise your arm. Keep your shoulder blade tucked down toward the middle of your back. Hold for __________ seconds. Slowly return to the starting position. Repeat __________ times. Complete this exercise __________ times a day. Internal rotation  Place your left / right hand behind your back, palm-up. Use your other hand to dangle an exercise band, a broomstick, or a similar object over your shoulder. Grasp the band with your left / right hand so you are holding on to both ends. Gently pull up on the band until you feel a stretch in the front of your left /  right shoulder. The movement of your arm toward the center of your body is called internal rotation. Avoid shrugging your shoulder while you raise your arm. Keep your shoulder blade tucked down toward the middle of your back. Hold for __________ seconds. Release the stretch by letting go of the band and lowering your hands. Repeat __________ times. Complete this exercise __________ times a day. Strengthening exercises External rotation  Sit in a stable chair without armrests. Secure an exercise band to a stable object at elbow height on your left / right side. Place a soft object, such as a folded towel or a small pillow, between your left / right upper arm and your body to move your elbow about 4 inches (10 cm) away from your side. Hold the end of the exercise band so it is tight and there is no slack. Keeping your elbow pressed against the soft object, slowly move your forearm out, away from your abdomen (external rotation). Keep your body steady so only your forearm moves. Hold for __________ seconds. Slowly return to the starting position. Repeat __________ times. Complete this exercise __________ times a day. Shoulder abduction  Sit in a stable chair without armrests, or stand up. Hold a __________ lb / kg weight  in your left / right hand, or hold an exercise band with both hands. Start with your arms straight down and your left / right palm facing in, toward your body. Slowly lift your left / right hand out to your side (abduction). Do not lift your hand above shoulder height unless your health care provider tells you that this is safe. Keep your arms straight. Avoid shrugging your shoulder while you do this movement. Keep your shoulder blade tucked down toward the middle of your back. Hold for __________ seconds. Slowly lower your arm, and return to the starting position. Repeat __________ times. Complete this exercise __________ times a day. Shoulder extension  Sit in a stable chair without armrests, or stand up. Secure an exercise band to a stable object in front of you so it is at shoulder height. Hold one end of the exercise band in each hand. Straighten your elbows and lift your hands up to shoulder height. Squeeze your shoulder blades together as you pull your hands down to the sides of your thighs (extension). Stop when your hands are straight down by your sides. Do not let your hands go behind your body. Hold for __________ seconds. Slowly return to the starting position. Repeat __________ times. Complete this exercise __________ times a day. Shoulder row  Sit in a stable chair without armrests, or stand up. Secure an exercise band to a stable object in front of you so it is at chest height. Hold one end of the exercise band in each hand. Position your palms so that your thumbs are facing the ceiling (neutral position). Bend each of your elbows to a 90-degree angle (right angle) and keep your upper arms at your sides. Step back or move the chair back until the band is tight and there is no slack. Slowly pull your elbows back behind you. Hold for __________ seconds. Slowly return to the starting position. Repeat __________ times. Complete this exercise __________ times a day. Shoulder  press-ups  Sit in a stable chair that has armrests. Sit upright, with your feet flat on the floor. Put your hands on the armrests so your elbows are bent and your fingers are pointing forward. Your hands should be about even with the sides of  your body. Push down on the armrests and use your arms to lift yourself off the chair. Straighten your elbows and lift yourself up as much as you comfortably can. Move your shoulder blades down, and avoid letting your shoulders move up toward your ears. Keep your feet on the ground. As you get stronger, your feet should support less of your body weight as you lift yourself up. Hold for __________ seconds. Slowly lower yourself back into the chair. Repeat __________ times. Complete this exercise __________ times a day. Wall push-ups  Stand so you are facing a stable wall. Your feet should be about one arm-length away from the wall. Lean forward and place your palms on the wall at shoulder height. Keep your feet flat on the floor as you bend your elbows and lean forward toward the wall. Hold for __________ seconds. Straighten your elbows to push yourself back to the starting position. Repeat __________ times. Complete this exercise __________ times a day. This information is not intended to replace advice given to you by your health care provider. Make sure you discuss any questions you have with your health care provider. Document Revised: 10/23/2021 Document Reviewed: 10/23/2021 [employer] Patient Education  2024 [employer]Cervical Strain and Sprain Rehab Ask your health care provider which exercises are safe for you. Do exercises exactly as told by your health care provider and adjust them as directed. It is normal to feel mild stretching, pulling, tightness, or discomfort as you do these exercises. Stop right away if you feel sudden pain or your pain gets worse. Do not begin these exercises until told by your health care provider. Stretching and  range-of-motion exercises Cervical side bending  Using good posture, sit on a stable chair or stand up. Without moving your shoulders, slowly tilt your left / right ear to your shoulder until you feel a stretch in the neck muscles on the opposite side. You should be looking straight ahead. Hold for __________ seconds. Repeat with the other side of your neck. Repeat __________ times. Complete this exercise __________ times a day. Cervical rotation  Using good posture, sit on a stable chair or stand up. Slowly turn your head to the side as if you are looking over your left / right shoulder. Keep your eyes level with the ground. Stop when you feel a stretch along the side and the back of your neck. Hold for __________ seconds. Repeat this by turning to your other side. Repeat __________ times. Complete this exercise __________ times a day. Thoracic extension and pectoral stretch  Roll a towel or a small blanket so it is about 4 inches (10 cm) in diameter. Lie down on your back on a firm surface. Put the towel in the middle of your back across your spine. It should not be under your shoulder blades. Put your hands behind your head and let your elbows fall out to your sides. Hold for __________ seconds. Repeat __________ times. Complete this exercise __________ times a day. Strengthening exercises Upper cervical flexion  Lie on your back with a thin pillow behind your head or a small, rolled-up towel under your neck. Gently tuck your chin toward your chest and nod your head down to look toward your feet. Do not lift your head off the pillow. Hold for __________ seconds. Release the tension slowly. Relax your neck muscles completely before you repeat this exercise. Repeat __________ times. Complete this exercise __________ times a day. Cervical extension  Stand about 6 inches (15 cm) away  from a wall, with your back facing the wall. Place a soft object, about 6-8 inches (15-20 cm) in  diameter, between the back of your head and the wall. A soft object could be a small pillow, a ball, or a folded towel. Gently tilt your head back and press into the soft object. Keep your jaw and forehead relaxed. Hold for __________ seconds. Release the tension slowly. Relax your neck muscles completely before you repeat this exercise. Repeat __________ times. Complete this exercise __________ times a day. Posture and body mechanics Body mechanics refer to the movements and positions of your body while you do your daily activities. Posture is part of body mechanics. Good posture and healthy body mechanics can help to relieve stress in your body's tissues and joints. Good posture means that your spine is in its natural S-curve position (your spine is neutral), your shoulders are pulled back slightly, and your head is not tipped forward. The following are general guidelines for using improved posture and body mechanics in your everyday activities. Sitting  When sitting, keep your spine neutral and keep your feet flat on the floor. Use a footrest, if needed, and keep your thighs parallel to the floor. Avoid rounding your shoulders. Avoid tilting your head forward. When working at a desk or a computer, keep your desk at a height where your hands are slightly lower than your elbows. Slide your chair under your desk so you are close enough to maintain good posture. When working at a computer, place your monitor at a height where you are looking straight ahead and you do not have to tilt your head forward or downward to look at the screen. Standing  When standing, keep your spine neutral and keep your feet about hip-width apart. Keep a slight bend in your knees. Your ears, shoulders, and hips should line up. When you do a task in which you stand in one place for a long time, place one foot up on a stable object that is 2-4 inches (5-10 cm) high, such as a footstool. This helps keep your spine  neutral. Resting When lying down and resting, avoid positions that are most painful for you. Try to support your neck in a neutral position. You can use a contour pillow or a small rolled-up towel. Your pillow should support your neck but not push on it. This information is not intended to replace advice given to you by your health care provider. Make sure you discuss any questions you have with your health care provider. Document Revised: 01/06/2023 Document Reviewed: 03/25/2022 [employer] Patient Education  2024 [employer]Hand Exercises Hand exercises can be helpful for almost anyone. They can strengthen your hands and improve flexibility and movement. The exercises can also increase blood flow to the hands. These results can make your work and daily tasks easier for you. Hand exercises can be especially helpful for people who have joint pain from arthritis or nerve damage from using their hands over and over. These exercises can also help people who injure a hand. Exercises Most of these hand exercises are gentle stretching and motion exercises. It is usually safe to do them often throughout the day. Warming up your hands before exercise may help reduce stiffness. You can do this with gentle massage or by placing your hands in warm water for 10-15 minutes. It is normal to feel some stretching, pulling, tightness, or mild discomfort when you begin new exercises. In time, this will improve. Remember to always be careful  and stop right away if you feel sudden, very bad pain or your pain gets worse. You want to get better and be safe. Ask your health care provider which exercises are safe for you. Do exercises exactly as told by your provider and adjust them as told. Do not begin these exercises until told by your provider. Knuckle bend or claw fist  Stand or sit with your arm, hand, and all five fingers pointed straight up. Make sure to keep your wrist straight. Gently bend your fingers down  toward your palm until the tips of your fingers are touching your palm. Keep your big knuckle straight and only bend the small knuckles in your fingers. Hold this position for 10 seconds. Straighten your fingers back to your starting position. Repeat this exercise 5-10 times with each hand. Full finger fist  Stand or sit with your arm, hand, and all five fingers pointed straight up. Make sure to keep your wrist straight. Gently bend your fingers into your palm until the tips of your fingers are touching the middle of your palm. Hold this position for 10 seconds. Extend your fingers back to your starting position, stretching every joint fully. Repeat this exercise 5-10 times with each hand. Straight fist  Stand or sit with your arm, hand, and all five fingers pointed straight up. Make sure to keep your wrist straight. Gently bend your fingers at the big knuckle, where your fingers meet your hand, and at the middle knuckle. Keep the knuckle at the tips of your fingers straight and try to touch the bottom of your palm. Hold this position for 10 seconds. Extend your fingers back to your starting position, stretching every joint fully. Repeat this exercise 5-10 times with each hand. Tabletop  Stand or sit with your arm, hand, and all five fingers pointed straight up. Make sure to keep your wrist straight. Gently bend your fingers at the big knuckle, where your fingers meet your hand, as far down as you can. Keep the small knuckles in your fingers straight. Think of forming a tabletop with your fingers. Hold this position for 10 seconds. Extend your fingers back to your starting position, stretching every joint fully. Repeat this exercise 5-10 times with each hand. Finger spread  Place your hand flat on a table with your palm facing down. Make sure your wrist stays straight. Spread your fingers and thumb apart from each other as far as you can until you feel a gentle stretch. Hold this position  for 10 seconds. Bring your fingers and thumb tight together again. Hold this position for 10 seconds. Repeat this exercise 5-10 times with each hand. Making circles  Stand or sit with your arm, hand, and all five fingers pointed straight up. Make sure to keep your wrist straight. Make a circle by touching the tip of your thumb to the tip of your index finger. Hold for 10 seconds. Then open your hand wide. Repeat this motion with your thumb and each of your fingers. Repeat this exercise 5-10 times with each hand. Thumb motion  Sit with your forearm resting on a table and your wrist straight. Your thumb should be facing up toward the ceiling. Keep your fingers relaxed as you move your thumb. Lift your thumb up as high as you can toward the ceiling. Hold for 10 seconds. Bend your thumb across your palm as far as you can, reaching the tip of your thumb for the small finger (pinkie) side of your palm. Hold for  10 seconds. Repeat this exercise 5-10 times with each hand. Grip strengthening  Hold a stress ball or other soft ball in the middle of your hand. Slowly increase the pressure, squeezing the ball as much as you can without causing pain. Think of bringing the tips of your fingers into the middle of your palm. All of your finger joints should bend when doing this exercise. Hold your squeeze for 10 seconds, then relax. Repeat this exercise 5-10 times with each hand. Contact a health care provider if: Your hand pain or discomfort gets much worse when you do an exercise. Your hand pain or discomfort does not improve within 2 hours after you exercise. If you have either of these problems, stop doing these exercises right away. Do not do them again unless your provider says that you can. Get help right away if: You develop sudden, severe hand pain or swelling. If this happens, stop doing these exercises right away. Do not do them again unless your provider says that you can. This information is  not intended to replace advice given to you by your health care provider. Make sure you discuss any questions you have with your health care provider. Document Revised: 09/17/2022 Document Reviewed: 09/17/2022 Elsevier Patient Education  2024 [employer].

## 2024-05-16 ENCOUNTER — Other Ambulatory Visit: Payer: Self-pay | Admitting: Internal Medicine

## 2024-05-25 ENCOUNTER — Other Ambulatory Visit: Payer: Self-pay | Admitting: Internal Medicine

## 2024-05-25 ENCOUNTER — Other Ambulatory Visit: Payer: Self-pay | Admitting: Nurse Practitioner

## 2024-05-25 DIAGNOSIS — F331 Major depressive disorder, recurrent, moderate: Secondary | ICD-10-CM

## 2024-05-25 DIAGNOSIS — K5904 Chronic idiopathic constipation: Secondary | ICD-10-CM

## 2024-05-27 DIAGNOSIS — R400 Somnolence: Secondary | ICD-10-CM | POA: Diagnosis not present

## 2024-05-27 DIAGNOSIS — G4733 Obstructive sleep apnea (adult) (pediatric): Secondary | ICD-10-CM | POA: Diagnosis not present

## 2024-05-28 ENCOUNTER — Encounter: Payer: Self-pay | Admitting: Gastroenterology

## 2024-05-29 ENCOUNTER — Other Ambulatory Visit: Payer: Self-pay | Admitting: Internal Medicine

## 2024-05-29 DIAGNOSIS — F331 Major depressive disorder, recurrent, moderate: Secondary | ICD-10-CM

## 2024-06-15 ENCOUNTER — Encounter (HOSPITAL_COMMUNITY): Payer: Self-pay

## 2024-06-16 ENCOUNTER — Other Ambulatory Visit: Payer: Self-pay | Admitting: Internal Medicine

## 2024-06-16 ENCOUNTER — Ambulatory Visit (HOSPITAL_COMMUNITY)
Admission: RE | Admit: 2024-06-16 | Discharge: 2024-06-16 | Disposition: A | Discharge status: Home / Self Care | Source: Ambulatory Visit | Attending: Internal Medicine | Admitting: Internal Medicine

## 2024-06-16 DIAGNOSIS — I517 Cardiomegaly: Secondary | ICD-10-CM

## 2024-06-16 MED ORDER — GADOBUTROL 1 MMOL/ML IV SOLN
10.0000 mL | Freq: Once | INTRAVENOUS | Status: AC | PRN
  Administered 2024-06-16: 10 mL via INTRAVENOUS

## 2024-06-19 ENCOUNTER — Other Ambulatory Visit: Payer: Self-pay | Admitting: Internal Medicine

## 2024-06-19 DIAGNOSIS — M7552 Bursitis of left shoulder: Secondary | ICD-10-CM

## 2024-06-21 ENCOUNTER — Ambulatory Visit: Payer: Self-pay | Admitting: Internal Medicine

## 2024-06-21 DIAGNOSIS — I422 Other hypertrophic cardiomyopathy: Secondary | ICD-10-CM

## 2024-06-25 NOTE — Progress Notes (Signed)
 Monique Warren                                          MRN: [id number]   06/25/2024   The [facility] Quality Team Specialist reviewed this patient medical record for the purposes of chart review for care gap closure. The following were reviewed: chart review for care gap closure-kidney health evaluation for diabetes:eGFR  and uACR.    [facility] Quality Team

## 2024-06-26 DIAGNOSIS — R400 Somnolence: Secondary | ICD-10-CM | POA: Diagnosis not present

## 2024-06-26 DIAGNOSIS — G4733 Obstructive sleep apnea (adult) (pediatric): Secondary | ICD-10-CM | POA: Diagnosis not present

## 2024-07-05 ENCOUNTER — Encounter: Payer: Self-pay | Admitting: Internal Medicine

## 2024-07-05 ENCOUNTER — Ambulatory Visit: Admitting: Internal Medicine

## 2024-07-05 VITALS — BP 136/87 | HR 107 | Ht 62.0 in | Wt 186.0 lb

## 2024-07-05 DIAGNOSIS — F331 Major depressive disorder, recurrent, moderate: Secondary | ICD-10-CM

## 2024-07-05 DIAGNOSIS — E1140 Type 2 diabetes mellitus with diabetic neuropathy, unspecified: Secondary | ICD-10-CM | POA: Diagnosis not present

## 2024-07-05 DIAGNOSIS — E782 Mixed hyperlipidemia: Secondary | ICD-10-CM

## 2024-07-05 DIAGNOSIS — I421 Obstructive hypertrophic cardiomyopathy: Secondary | ICD-10-CM | POA: Diagnosis not present

## 2024-07-05 DIAGNOSIS — K5904 Chronic idiopathic constipation: Secondary | ICD-10-CM | POA: Diagnosis not present

## 2024-07-05 DIAGNOSIS — Z7985 Long-term (current) use of injectable non-insulin antidiabetic drugs: Secondary | ICD-10-CM

## 2024-07-05 DIAGNOSIS — I422 Other hypertrophic cardiomyopathy: Secondary | ICD-10-CM | POA: Insufficient documentation

## 2024-07-05 DIAGNOSIS — Z23 Encounter for immunization: Secondary | ICD-10-CM

## 2024-07-05 DIAGNOSIS — I10 Essential (primary) hypertension: Secondary | ICD-10-CM | POA: Diagnosis not present

## 2024-07-05 DIAGNOSIS — E559 Vitamin D deficiency, unspecified: Secondary | ICD-10-CM | POA: Diagnosis not present

## 2024-07-05 DIAGNOSIS — Z0001 Encounter for general adult medical examination with abnormal findings: Secondary | ICD-10-CM | POA: Diagnosis not present

## 2024-07-05 NOTE — Assessment & Plan Note (Signed)
 BP Readings from Last 1 Encounters:  07/05/24 136/87   Well-controlled with Cardizem  240 mg QD Had to stop lisinopril  due to hypotension in the past Counseled for compliance with the medications Advised DASH diet and moderate exercise/walking, at least 150 mins/week

## 2024-07-05 NOTE — Assessment & Plan Note (Signed)
On atorvastatin 40 mg QD

## 2024-07-05 NOTE — Progress Notes (Signed)
 Established Patient Office Visit  Subjective:  Patient ID: Monique Warren, female    DOB: 02/29/1951  Age: 73 y.o. MRN: [id number]  CC:  Chief Complaint  Patient presents with   Annual Exam    HPI Monique Warren is a 73 y.o. female with past medical history of hypertension, cerebral artery aneurysm s/p clip, migraine, asthma, OSA, celiac disease, GERD, type II DM, osteoporosis, depression with anxiety and obesity who presents for f/u of her chronic medical conditions.  HTN: BP is wnl today. Takes diltiazem  240 mg QD regularly. Patient denies headache, chest pain, dyspnea or palpitations.  Her lisinopril  had been stopped due to hypotensive episodes.  HOCM: She had cardiac MRI done, which was consistent with HOCM.  She has been referred to Dr Tellis for further management now.  She still reports progressive dyspnea on exertion and fatigue.  Type II DM: Her HbA1C was 5.9 in 07/25. She has been taking Ozempic  2 mg qw.  Her blood glucose has been overall better controlled, averages around 140 on CGM. She denies any polyuria or polydipsia.  MDD: She takes Cymbalta  60 mg daily.  She was placed on Effexor  instead of Wellbutrin .  She has been tolerating it better.  She reports improvement in her apathy and concentration with it. Denies any SI or HI currently.  Migraine: Takes Aimovig  and feels better now. Takes zolmitriptan as needed for breakthrough headache.  Cerebral artery aneurysm status post clip placement: She has not seen Neurosurgeon since moving to [place]. Her last MRI was around 5 years ago.  She also complains of chronic left shoulder pain.  She has seen Dr. Margrette for it. She has had subacromial steroid injection for bursitis, which did not provide relief for more than a week.  Chronic constipation: She has chronic constipation despite using stool softeners.  She has tried Colace, Senokot, and MiraLAX  without much relief.  She has responded better to Linzess .  She is  trying to maintain adequate fluid intake.  Denies melena or hematochezia.  Past Medical History:  Diagnosis Date   Allergy    Anxiety    Arthritis    Asthma    Cataract    Both eyes, but have been removed   Celiac disease    Depression    Diabetes mellitus, type II (HCC)    Diabetic neuropathy (HCC)    Diastolic dysfunction    Grade 1 with preserved EF   Dysrhythmia    GERD (gastroesophageal reflux disease)    Heart murmur    Hiatal hernia    Sliding   Hyperlipidemia    Hypertension    Migraine    Obstructive sleep apnea    Osteoporosis    Sleep apnea    Vertigo     Past Surgical History:  Procedure Laterality Date   ABDOMINAL HYSTERECTOMY  09/29/2003   APPENDECTOMY     BALLOON DILATION N/A 02/08/2022   Procedure: BALLOON DILATION;  Surgeon: Cindie Carlin POUR, DO;  [facility];  Service: Endoscopy;  Laterality: N/A;   BIOPSY  02/08/2022   Procedure: BIOPSY;  Surgeon: Cindie Carlin POUR, DO;  [facility];  Service: Endoscopy;;   BIOPSY  07/11/2023   Procedure: BIOPSY;  Surgeon: Cindie Carlin POUR, DO;  [facility];  Service: Endoscopy;;   BRAIN SURGERY     BREAST BIOPSY Left    CARPAL TUNNEL RELEASE     CATARACT EXTRACTION Bilateral    October and November 2016  CESAREAN SECTION     COLONOSCOPY  09/2017   Ozell GORMAN Colla, MD in [place]; 5 mm tubular adenoma in the descending colon s/p resected, mild sigmoid diverticulosis, internal hemorrhoids.  Recommended repeat colonoscopy in 5 years.   COLONOSCOPY WITH PROPOFOL  N/A 07/11/2023   Surgeon: Cindie Carlin POUR, DO; Nonbleeding internal hemorrhoids, diverticulosis in the sigmoid colon, 5 mm polyp in the transverse colon resected and retrieved, 8 mm polyp in the sigmoid colon resected and retrieved, single nonbleeding angiodysplastic lesion.  Pathology with 1 tubular adenoma and 1 hyperplastic polyp.  Recommended 7-year surveillance.   CRANIOTOMY FOR ANEURYSM / VERTEBROBASILAR / CAROTID  CIRCULATION Right 10/24/2015   ESOPHAGOGASTRODUODENOSCOPY  09/2017   [name] ; irregular Z-line s/p biopsy, decreased LES tone, 4 cm hiatal hernia, erythema in gastric antrum biopsied, normal examined duodenum biopsied.  Esophageal biopsy suggestive of reflux, no Barrett's, gastric biopsy with nonspecific chronic gastritis with intestinal metaplasia, duodenal biopsy with increased intraepithelial lymphocytes without blunting of villi, nonspecific.   ESOPHAGOGASTRODUODENOSCOPY (EGD) WITH PROPOFOL  N/A 02/08/2022   Surgeon: Cindie Carlin POUR, DO; 2 cm hiatal hernia, short segment Barrett's esophagus without dysplasia, mild Schatzki's ring dilated, gastritis with biopsies benign, normal examined duodenum.  Repeat EGD in 5 years.   ESOPHAGOGASTRODUODENOSCOPY (EGD) WITH PROPOFOL  N/A 07/11/2023   Surgeon: Cindie Carlin POUR, DO;  cm hiatal hernia, mucosal changes secondary to short segment Barrett's esophagus biopsied, esophageal mucosal changes suspicious for EOE biopsied and dilated, gastritis biopsied. Gastric biopsies benign, all esophageal biopsies benign.   EYE SURGERY     JOINT REPLACEMENT     KNEE ARTHROSCOPY WITH MEDIAL MENISECTOMY Left 04/09/2022   Procedure: KNEE ARTHROSCOPY WITH PARTIAL MEDIAL MENISCECTOMY, LATERAL MENISCAL DEBRIDEMENT;  Surgeon: Margrette Taft BRAVO, MD;  Location: [facility];  Service: Orthopedics;  Laterality: Left;   KNEE ARTHROSCOPY WITH MENISCAL REPAIR Left 04/09/2022   Procedure: KNEE ARTHROSCOPY WITH MEDIAL MENISCAL REPAIR;  Surgeon: Margrette Taft BRAVO, MD;  Location: [facility];  Service: Orthopedics;  Laterality: Left;   LAPAROSCOPY     POLYPECTOMY  07/11/2023   Procedure: POLYPECTOMY;  Surgeon: Cindie Carlin POUR, DO;  [facility];  Service: Endoscopy;;   TOTAL KNEE ARTHROPLASTY Right 12/06/2022   Procedure: TOTAL KNEE ARTHROPLASTY;  Surgeon: Margrette Taft BRAVO, MD;  Location: [facility];  Service: Orthopedics;  Laterality: Right;   TUBAL LIGATION     UTERINE  FIBROID SURGERY     WRIST SURGERY Bilateral     Family History  Problem Relation Age of Onset   Breast cancer Mother    Thyroid disease Mother    Stroke Mother    Arthritis Mother    Depression Mother    Hyperlipidemia Mother    Hypertension Father    Hyperlipidemia Father    Heart attack Father    Heart failure Father    Alcohol abuse Father    Heart disease Father    Early death Brother    Breast cancer Paternal Grandmother    ADD / ADHD Daughter    Cancer - Colon Neg Hx    Gastric cancer Neg Hx    Esophageal cancer Neg Hx    Liver cancer Neg Hx    Autoimmune disease Neg Hx     Social History   Socioeconomic History   Marital status: Married    Spouse name: Not on file   Number of children: Not on file   Years of education: Not on file   Highest education level: GED or equivalent  Occupational History  Not on file  Tobacco Use   Smoking status: Former    Current packs/day: 0.50    Types: Cigarettes    Passive exposure: Past   Smokeless tobacco: Never  Vaping Use   Vaping status: Never Used  Substance and Sexual Activity   Alcohol use: Not Currently    Comment: rarely   Drug use: Never   Sexual activity: Not on file  Other Topics Concern   Not on file  Social History Narrative   Not on file   Social Drivers of Health   Financial Resource Strain: Low Risk  (07/01/2024)   Overall Financial Resource Strain (CARDIA)    Difficulty of Paying Living Expenses: Not very hard  Food Insecurity: No Food Insecurity (07/01/2024)   Hunger Vital Sign    Worried About Running Out of Food in the Last Year: Never true    Ran Out of Food in the Last Year: Never true  Transportation Needs: No Transportation Needs (07/01/2024)   PRAPARE - Administrator, Civil Service (Medical): No    Lack of Transportation (Non-Medical): No  Physical Activity: Inactive (07/01/2024)   Exercise Vital Sign    Days of Exercise per Week: 0 days    Minutes of Exercise per  Session: Not on file  Stress: Stress Concern Present (07/01/2024)   [employer] of Occupational Health - Occupational Stress Questionnaire    Feeling of Stress: To some extent  Social Connections: Moderately Integrated (07/01/2024)   Social Connection and Isolation Panel    Frequency of Communication with Friends and Family: More than three times a week    Frequency of Social Gatherings with Friends and Family: More than three times a week    Attends Religious Services: More than 4 times per year    Active Member of [employer] or Organizations: No    Attends Engineer, structural: Not on file    Marital Status: Married  Catering manager Violence: Not At Risk (04/21/2024)   Humiliation, Afraid, Rape, and Kick questionnaire    Fear of Current or Ex-Partner: No    Emotionally Abused: No    Physically Abused: No    Sexually Abused: No    Outpatient Medications Prior to Visit  Medication Sig Dispense Refill   acetaminophen  (TYLENOL ) 500 MG tablet Take 1 tablet (500 mg total) by mouth every 6 (six) hours as needed for moderate pain. 30 tablet 0   AIMOVIG  140 MG/ML SOAJ INJECT 1 PEN UNDER THE SKIN 1 TIME MONTHLY 1 mL 11   albuterol  (VENTOLIN  HFA) 108 (90 Base) MCG/ACT inhaler USE 2 INHALATIONS ORALLY   EVERY 6 HOURS AS NEEDED FORWHEEZING OR SHORTNESS OF   BREATH 18 g 10   atorvastatin  (LIPITOR) 40 MG tablet TAKE 1 TABLET AT BEDTIME 90 tablet 3   Blood Glucose Monitoring Suppl (ONE TOUCH ULTRA 2) w/Device KIT USE TO CHECK BLOOD GLUCOSE 2 TIMES A DAY 1 kit 0   Cholecalciferol  (VITAMIN D3) 125 MCG (5000 UT) capsule Take 5,000 Units by mouth daily.     diltiazem  (CARDIZEM  SR) 120 MG 12 hr capsule Take 2 capsules (240 mg total) by mouth daily. 180 capsule 3   DULoxetine  (CYMBALTA ) 60 MG capsule TAKE 1 CAPSULE DAILY 90 capsule 3   Estradiol  10 MCG TABS vaginal tablet Place 10 mcg vaginally every Monday. (Patient not taking: Reported on 05/11/2024)     Fluticasone -Salmeterol,sensor, (AIRDUO  DIGIHALER) 232-14 MCG/ACT AEPB Inhale 1 Inhalation into the lungs 2 (two) times daily. USE 1  INHALATION TWICE A DAY Strength: 232-14 MCG/ACT 1 each 11   gabapentin  (NEURONTIN ) 300 MG capsule TAKE 1 CAPSULE AT BEDTIME 90 capsule 3   Lancets (ONETOUCH DELICA PLUS LANCET33G) MISC Use to check glucose once daily 100 each 6   LINZESS  72 MCG capsule TAKE 1 CAPSULE DAILY BEFOREBREAKFAST 90 capsule 1   meclizine  (ANTIVERT ) 25 MG tablet Take 1 tablet (25 mg total) by mouth 3 (three) times daily as needed for dizziness. 30 tablet 0   methocarbamol  (ROBAXIN ) 500 MG tablet Take 500 mg by mouth 4 (four) times daily.     Multiple Vitamin (MULTIVITAMIN WITH MINERALS) TABS tablet Take 1 tablet by mouth daily.     ONETOUCH ULTRA test strip Use to monitor glucose once daily 100 each 6   OZEMPIC , 2 MG/DOSE, 8 MG/3ML SOPN INJECT 2MG  ONCE A WEEK AS  DIRECTED 6 mL 3   PROLIA  60 MG/ML SOSY injection INJECT 60 MG UNDER THE SKIN EVERY 6 MONTHS 1 mL 1   promethazine  (PHENERGAN ) 12.5 MG tablet Take 1 tablet (12.5 mg total) by mouth every 6 (six) hours as needed for nausea or vomiting. 30 tablet 0   traMADol  (ULTRAM ) 50 MG tablet TAKE 1 TABLET BY MOUTH EVERY 12 HOURS AS NEEDED 20 tablet 0   triamcinolone  cream (KENALOG ) 0.1 % Apply 1 Application topically 2 (two) times daily. (Patient not taking: Reported on 05/11/2024) 30 g 0   venlafaxine  (EFFEXOR ) 75 MG tablet TAKE 1 TABLET TWICE A DAY 180 tablet 1   Vonoprazan Fumarate  (VOQUEZNA ) 20 MG TABS Take 20 mg by mouth daily. (Patient not taking: Reported on 05/11/2024) 30 tablet 11   zolmitriptan (ZOMIG) 5 MG tablet Take 5 mg by mouth daily as needed for migraine.     zaleplon  (SONATA ) 5 MG capsule Take 1 capsule (5 mg total) by mouth at bedtime as needed for sleep. (Patient not taking: Reported on 05/11/2024) 30 capsule 0   Facility-Administered Medications Prior to Visit  Medication Dose Route Frequency Provider Last Rate Last Admin   bupivacaine -meloxicam  ER (ZYNRELEF )  injection 400 mg  400 mg Infiltration Once Margrette Taft BRAVO, MD       bupivacaine -meloxicam  ER (ZYNRELEF ) injection 400 mg  400 mg Infiltration Once Margrette Taft BRAVO, MD       denosumab  (PROLIA ) injection 60 mg  60 mg Subcutaneous Q6 months Arrielle Mcginn K, MD   60 mg at 04/07/24 1435    Allergies  Allergen Reactions   Cefuroxime Anaphylaxis, Hives, Shortness Of Breath, Swelling and Anxiety   Codeine Hives, Shortness Of Breath, Nausea And Vomiting, Swelling and Anxiety   Morphine  Anaphylaxis   Sulfa Antibiotics Anaphylaxis, Hives, Nausea And Vomiting and Swelling    Swollen throat   Sulfamethoxazole-Trimethoprim Shortness Of Breath, Swelling and Anxiety    Throat swelling    Gluten Meal     Severe reflux - celiac disease    ROS Review of Systems  Constitutional:  Positive for fatigue. Negative for chills and fever.  HENT:  Negative for congestion and sore throat.   Eyes:  Positive for visual disturbance. Negative for pain and discharge.  Respiratory:  Negative for cough and shortness of breath.   Cardiovascular:  Negative for chest pain and palpitations.  Gastrointestinal:  Positive for constipation. Negative for abdominal pain, diarrhea, nausea and vomiting.  Endocrine: Negative for polydipsia and polyuria.  Genitourinary:  Negative for dysuria and hematuria.  Musculoskeletal:  Positive for arthralgias (Left knee, left shoulder, elbow and hand), back pain and neck pain.  Negative for neck stiffness.  Skin:  Negative for rash.  Neurological:  Positive for weakness (Right hand). Negative for dizziness.  Psychiatric/Behavioral:  Positive for sleep disturbance. Negative for agitation and behavioral problems.       Objective:    Physical Exam Vitals reviewed.  Constitutional:      General: She is not in acute distress.    Appearance: She is obese. She is not diaphoretic.  HENT:     Head: Normocephalic and atraumatic.     Nose: Congestion present.     Mouth/Throat:      Mouth: Mucous membranes are moist.  Eyes:     General: No scleral icterus.    Extraocular Movements: Extraocular movements intact.  Cardiovascular:     Rate and Rhythm: Normal rate and regular rhythm.     Pulses: Normal pulses.     Heart sounds: Murmur (Systolic over right upper sternal border) heard.  Pulmonary:     Breath sounds: Normal breath sounds. No wheezing or rales.  Abdominal:     Palpations: Abdomen is soft.     Tenderness: There is no abdominal tenderness.  Musculoskeletal:     Left shoulder: Tenderness present. No swelling. Decreased range of motion.     Cervical back: Neck supple. No tenderness.     Right lower leg: No edema.     Left lower leg: No edema.     Comments: S/p right TKA, incision C/D/I  Skin:    General: Skin is warm.     Findings: No rash.  Neurological:     General: No focal deficit present.     Mental Status: She is alert and oriented to person, place, and time.     Cranial Nerves: No cranial nerve deficit.     Sensory: No sensory deficit.     Motor: No weakness.  Psychiatric:        Mood and Affect: Mood normal.        Behavior: Behavior normal.     BP 136/87   Pulse (!) 107   Ht 5' 2 (1.575 m)   Wt 186 lb (84.4 kg)   SpO2 97%   BMI 34.02 kg/m  Wt Readings from Last 3 Encounters:  07/05/24 186 lb (84.4 kg)  05/11/24 188 lb (85.3 kg)  04/29/24 190 lb (86.2 kg)    Lab Results  Component Value Date   TSH 1.520 06/16/2023   Lab Results  Component Value Date   WBC 7.4 06/16/2023   HGB 14.8 06/16/2023   HCT 44.2 06/16/2023   MCV 94 06/16/2023   PLT 328 06/16/2023   Lab Results  Component Value Date   NA 142 06/16/2023   K 4.6 06/16/2023   CO2 22 06/16/2023   GLUCOSE 102 (H) 06/16/2023   BUN 14 06/16/2023   CREATININE 0.87 06/16/2023   BILITOT 0.3 06/16/2023   ALKPHOS 94 06/16/2023   AST 19 06/16/2023   ALT 22 06/16/2023   PROT 6.4 06/16/2023   ALBUMIN 4.1 06/16/2023   CALCIUM  9.0 06/16/2023   ANIONGAP 8 12/07/2022    EGFR 71 06/16/2023   Lab Results  Component Value Date   CHOL 202 (H) 06/16/2023   Lab Results  Component Value Date   HDL 51 06/16/2023   Lab Results  Component Value Date   LDLCALC 127 (H) 06/16/2023   Lab Results  Component Value Date   TRIG 137 06/16/2023   Lab Results  Component Value Date   CHOLHDL 4.0 06/16/2023   Lab  Results  Component Value Date   HGBA1C 5.9 (A) 04/06/2024      Assessment & Plan:   Problem List Items Addressed This Visit       Cardiovascular and Mediastinum   HTN (hypertension)   BP Readings from Last 1 Encounters:  07/05/24 136/87   Well-controlled with Cardizem  240 mg QD Had to stop lisinopril  due to hypotension in the past Counseled for compliance with the medications Advised DASH diet and moderate exercise/walking, at least 150 mins/week      HOCM (hypertrophic obstructive cardiomyopathy) (HCC)   Has fatigue and dyspnea on exertion Has been evaluated by cardiology Planned to see HOCM clinic in [facility]        Endocrine   Type 2 diabetes mellitus with diabetic neuropathy, unspecified (HCC)   Lab Results  Component Value Date   HGBA1C 5.9 (A) 04/06/2024   Associated with HTN, GERD and depression Well controlled now On Ozempic  2 mg qw, followed by Endocrinology On statin and ACEi Diabetic eye exam: Referred to Ophthalmology On Cymbalta  for neuropathy On gabapentin  300 mg nightly for chronic pain        Other   MDD (major depressive disorder), recurrent episode (Chronic)   Better controlled since starting Effexor , she prefers to stay on same dose for now On Cymbalta  (for back pain) as well Switched from Wellbutrin  to Effexor  in the past as she was having resistant depression with Wellbutrin  300 mg dose      Hyperlipidemia   On atorvastatin  40 mg QD      Encounter for general adult medical examination with abnormal findings - Primary   Physical exam as documented. Fasting blood tests today.      Other Visit  Diagnoses       Encounter for immunization       Relevant Orders   Flu vaccine HIGH DOSE PF(Fluzone Trivalent) (Completed)        No orders of the defined types were placed in this encounter.   Follow-up: Return in about 4 months (around 11/05/2024) for MDD and migraine.    Suzzane MARLA Blanch, MD

## 2024-07-05 NOTE — Assessment & Plan Note (Signed)
 Better controlled since starting Effexor , she prefers to stay on same dose for now On Cymbalta  (for back pain) as well Switched from Wellbutrin  to Effexor  in the past as she was having resistant depression with Wellbutrin  300 mg dose

## 2024-07-05 NOTE — Assessment & Plan Note (Signed)
 Has fatigue and dyspnea on exertion Has been evaluated by cardiology Planned to see HOCM clinic in [facility]

## 2024-07-05 NOTE — Assessment & Plan Note (Addendum)
 Physical exam as documented. Fasting blood tests today.

## 2024-07-05 NOTE — Patient Instructions (Signed)
 Please continue to take medications as prescribed.  Please continue to follow low carb diet and perform moderate exercise/walking as tolerated.

## 2024-07-05 NOTE — Assessment & Plan Note (Signed)
 Lab Results  Component Value Date   HGBA1C 5.9 (A) 04/06/2024   Associated with HTN, GERD and depression Well controlled now On Ozempic  2 mg qw, followed by Endocrinology On statin and ACEi Diabetic eye exam: Referred to Ophthalmology On Cymbalta  for neuropathy On gabapentin  300 mg nightly for chronic pain

## 2024-07-06 ENCOUNTER — Other Ambulatory Visit: Payer: Self-pay | Admitting: Internal Medicine

## 2024-07-06 ENCOUNTER — Ambulatory Visit: Payer: Self-pay | Admitting: Internal Medicine

## 2024-07-06 DIAGNOSIS — E782 Mixed hyperlipidemia: Secondary | ICD-10-CM

## 2024-07-06 LAB — CBC WITH DIFFERENTIAL/PLATELET
Basophils Absolute: 0 x10E3/uL (ref 0.0–0.2)
Basos: 0 %
EOS (ABSOLUTE): 0.1 x10E3/uL (ref 0.0–0.4)
Eos: 1 %
Hematocrit: 46.8 % — ABNORMAL HIGH (ref 34.0–46.6)
Hemoglobin: 15.9 g/dL (ref 11.1–15.9)
Immature Grans (Abs): 0 x10E3/uL (ref 0.0–0.1)
Immature Granulocytes: 0 %
Lymphocytes Absolute: 1.7 x10E3/uL (ref 0.7–3.1)
Lymphs: 27 %
MCH: 32.1 pg (ref 26.6–33.0)
MCHC: 34 g/dL (ref 31.5–35.7)
MCV: 94 fL (ref 79–97)
Monocytes Absolute: 0.4 x10E3/uL (ref 0.1–0.9)
Monocytes: 6 %
Neutrophils Absolute: 4.2 x10E3/uL (ref 1.4–7.0)
Neutrophils: 66 %
Platelets: 267 x10E3/uL (ref 150–450)
RBC: 4.96 x10E6/uL (ref 3.77–5.28)
RDW: 12.7 % (ref 11.7–15.4)
WBC: 6.4 x10E3/uL (ref 3.4–10.8)

## 2024-07-06 LAB — LIPID PANEL
Chol/HDL Ratio: 4.2 ratio (ref 0.0–4.4)
Cholesterol, Total: 189 mg/dL (ref 100–199)
HDL: 45 mg/dL (ref >39)
LDL Chol Calc (NIH): 120 mg/dL — ABNORMAL HIGH (ref 0–99)
Triglycerides: 133 mg/dL (ref 0–149)
VLDL Cholesterol Cal: 24 mg/dL (ref 5–40)

## 2024-07-06 LAB — CMP14+EGFR
ALT: 28 IU/L (ref 0–32)
AST: 30 IU/L (ref 0–40)
Albumin: 4.2 g/dL (ref 3.8–4.8)
Alkaline Phosphatase: 99 IU/L (ref 49–135)
BUN/Creatinine Ratio: 22 (ref 12–28)
BUN: 13 mg/dL (ref 8–27)
Bilirubin Total: 0.5 mg/dL (ref 0.0–1.2)
CO2: 18 mmol/L — ABNORMAL LOW (ref 20–29)
Calcium: 8.6 mg/dL — ABNORMAL LOW (ref 8.7–10.3)
Chloride: 107 mmol/L — ABNORMAL HIGH (ref 96–106)
Creatinine, Ser: 0.6 mg/dL (ref 0.57–1.00)
Globulin, Total: 1.7 g/dL (ref 1.5–4.5)
Glucose: 135 mg/dL — ABNORMAL HIGH (ref 70–99)
Potassium: 4.5 mmol/L (ref 3.5–5.2)
Sodium: 140 mmol/L (ref 134–144)
Total Protein: 5.9 g/dL — ABNORMAL LOW (ref 6.0–8.5)
eGFR: 95 mL/min/1.73 (ref >59)

## 2024-07-06 LAB — VITAMIN D 25 HYDROXY (VIT D DEFICIENCY, FRACTURES): Vit D, 25-Hydroxy: 27.7 ng/mL — ABNORMAL LOW (ref 30.0–100.0)

## 2024-07-06 LAB — TSH: TSH: 2.14 u[IU]/mL (ref 0.450–4.500)

## 2024-07-06 MED ORDER — ATORVASTATIN CALCIUM 80 MG PO TABS: 80.0000 mg | ORAL_TABLET | Freq: Every day | ORAL | 1 refills | Status: AC

## 2024-07-19 ENCOUNTER — Ambulatory Visit: Attending: Internal Medicine

## 2024-07-19 ENCOUNTER — Ambulatory Visit: Admitting: Genetic Counselor

## 2024-07-19 ENCOUNTER — Encounter: Payer: Self-pay | Admitting: Radiology

## 2024-07-19 ENCOUNTER — Ambulatory Visit: Attending: Internal Medicine | Admitting: Internal Medicine

## 2024-07-19 ENCOUNTER — Other Ambulatory Visit: Payer: Self-pay

## 2024-07-19 VITALS — BP 120/76 | HR 82 | Ht 62.0 in | Wt 186.0 lb

## 2024-07-19 DIAGNOSIS — I422 Other hypertrophic cardiomyopathy: Secondary | ICD-10-CM | POA: Diagnosis not present

## 2024-07-19 DIAGNOSIS — R002 Palpitations: Secondary | ICD-10-CM | POA: Diagnosis not present

## 2024-07-19 DIAGNOSIS — I10 Essential (primary) hypertension: Secondary | ICD-10-CM | POA: Diagnosis not present

## 2024-07-19 DIAGNOSIS — Q2381 Bicuspid aortic valve: Secondary | ICD-10-CM | POA: Diagnosis not present

## 2024-07-19 MED ORDER — METOPROLOL SUCCINATE ER 25 MG PO TB24: 25.0000 mg | ORAL_TABLET | Freq: Every evening | ORAL | 3 refills | Status: DC

## 2024-07-19 NOTE — Progress Notes (Unsigned)
 Enrolled for [employer] to mail a ZIO XT long term holter monitor to the patients address on file.

## 2024-07-19 NOTE — Progress Notes (Signed)
 Cardiology Office Note:  .    Date:  07/19/2024  ID:  DAILEY ALBERSON, DOB 1951/03/24, MRN [id number] PCP: Tobie Suzzane POUR, MD   HeartCare Providers Cardiologist:  Vina Gull, MD     CC: symptoms Consulted for the evaluation of oHCM at the behest of Dr. Gull  History of Present Illness: .    Monique Warren is a 73 y.o. female with obstructive hypertrophic cardiomyopathy and a bicuspid aortic valve who presents with chest discomfort and dizziness.  She experiences a 'heavy' feeling in her chest, which she initially thought was related to her asthma. There is no sharp chest pain, but the heaviness is persistent. Palpitations are frequent, especially at night, disrupting her sleep and keeping her awake until early morning. She uses a rescue inhaler for asthma but has not had a recent asthma attack.  She reports significant fatigue, needing to return to bed shortly after waking. Dizziness occurs upon standing, which she distinguishes from previous vertigo experienced after brain surgery for a cerebral aneurysm in 2017. The current dizziness is different from past vertigo episodes.  She has a history of syncope, with the last episode occurring last year, prompting her to stop driving. She experiences frequent headaches, though they have not escalated to migraines. She takes medication for migraines and uses a shot for breakthrough pain.  She has a history of a bicuspid aortic valve and obstructive hypertrophic cardiomyopathy. She is currently on diltiazem  240 mg daily, which initially helped but now seems less effective. Metoprolol  was previously tried but discontinued due to low blood pressure.  Her family history includes her father, who had heart problems and died from blockages after open heart surgery. Her daughter, a paramedic, developed heart issues after receiving a COVID shot. She has three children and several grandchildren, with one daughter having heart problems.  None  with known HCM or BAV.  Discussed the use of AI scribe software for clinical note transcription with the patient, who gave verbal consent to proceed.   Relevant histories: .  Social  - Employment: Company Secretary  - Living Situation: Lives in [place], [place] , retired there. - Has three children and grandchildren. Retired in [place], [place] . 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 Family history of heart problems.  ROS: As per HPI.   Studies Reviewed: .     Cardiac Studies & Procedures   ______________________________________________________________________________________________   STRESS TESTS  MYOCARDIAL PERFUSION IMAGING 04/25/2017   ECHOCARDIOGRAM  ECHOCARDIOGRAM COMPLETE 04/06/2024  Narrative ECHOCARDIOGRAM REPORT    Patient Name:   Monique Warren Date of Exam: 04/06/2024 Medical Rec #:  [id number]         Height:       62.0 in Accession #:    [id number]        Weight:       190.8 lb Date of Birth:  11-03-1950          BSA:          1.874 m Patient Age:    72 years          BP:           140/88 mmHg Patient Gender: F                 HR:           80 bpm. Exam Location:  [facility]  Procedure: 2D Echo, Cardiac Doppler and Color Doppler (Both Spectral and Color Flow Doppler were utilized during procedure).  Indications:    Murmur R01.1  History:        Patient has prior history of Echocardiogram examinations, most recent 10/11/2021. Risk Factors:Hypertension, Diabetes, Dyslipidemia and Sleep Apnea. Hx of COVID-19.  Sonographer:    Aida Pizza RCS Referring Phys: [id number] PAULA V ROSS  IMPRESSIONS   1. LV is severely hypoertrophied, more prominent than on echo from 2023. Narrow LVOT. Turbulent flow through LV/LVOT WIth Valsalva maneuver, peak gradient through LV/LVOT increases to 86 mm Hg (4.63 m/sec) Overall consistent with HOCM . Left ventricular ejection fraction, by estimation, is 65 to 70%. The left ventricle has normal function. The left ventricle has  no regional wall motion abnormalities. There is severe asymmetric left ventricular hypertrophy. Left ventricular diastolic parameters are consistent with Grade I diastolic dysfunction (impaired relaxation). Elevated left atrial pressure. 2. Right ventricular systolic function is normal. The right ventricular size is normal. 3. The mitral valve is normal in structure. Mild mitral valve regurgitation. 4. AV is thickened, probable trileaflet Difficult to see well Mildly restricted motion. . The aortic valve was not well visualized. Aortic valve regurgitation is not visualized. Aortic valve sclerosis/calcification is present, without any evidence of aortic stenosis. 5. The inferior vena cava is normal in size with greater than 50% respiratory variability, suggesting right atrial pressure of 3 mmHg.  Comparison(s): The left ventricular function is unchanged.  FINDINGS Left Ventricle: LV is severely hypoertrophied, more prominent than on echo from 2023. Narrow LVOT. Turbulent flow through LV/LVOT WIth Valsalva maneuver, peak gradient through LV/LVOT increases to 86 mm Hg (4.63 m/sec) Overall consistent with HOCM. Left ventricular ejection fraction, by estimation, is 65 to 70%. The left ventricle has normal function. The left ventricle has no regional wall motion abnormalities. The left ventricular internal cavity size was small. There is severe asymmetric left ventricular hypertrophy. Left ventricular diastolic parameters are consistent with Grade I diastolic dysfunction (impaired relaxation). Elevated left atrial pressure.  Right Ventricle: The right ventricular size is normal. Right vetricular wall thickness was not assessed. Right ventricular systolic function is normal.  Left Atrium: Left atrial size was normal in size.  Right Atrium: Right atrial size was normal in size.  Pericardium: There is no evidence of pericardial effusion.  Mitral Valve: The mitral valve is normal in structure. Mild  mitral valve regurgitation.  Tricuspid Valve: The tricuspid valve is grossly normal. Tricuspid valve regurgitation is trivial.  Aortic Valve: AV is thickened, probable trileaflet Difficult to see well Mildly restricted motion. The aortic valve was not well visualized. Aortic valve regurgitation is not visualized. Aortic valve sclerosis/calcification is present, without any evidence of aortic stenosis. Aortic valve mean gradient measures 30.0 mmHg. Aortic valve peak gradient measures 58.7 mmHg.  Pulmonic Valve: The pulmonic valve was not well visualized. Pulmonic valve regurgitation is not visualized. No evidence of pulmonic stenosis.  Aorta: The aortic root is normal in size and structure.  Venous: The inferior vena cava is normal in size with greater than 50% respiratory variability, suggesting right atrial pressure of 3 mmHg.  IAS/Shunts: No atrial level shunt detected by color flow Doppler.   LEFT VENTRICLE PLAX 2D LVIDd:         3.10 cm   Diastology LVIDs:         2.30 cm   LV e' medial:    3.26 cm/s LV PW:         1.78 cm   LV E/e' medial:  24.3 LV IVS:        2.17 cm  LV e' lateral:   4.13 cm/s LVOT diam:     1.80 cm   LV E/e' lateral: 19.2 LVOT Area:     2.54 cm   RIGHT VENTRICLE RV S prime:     12.10 cm/s TAPSE (M-mode): 1.6 cm  LEFT ATRIUM             Index        RIGHT ATRIUM          Index LA diam:        3.50 cm 1.87 cm/m   RA Area:     8.61 cm LA Vol (A2C):   48.2 ml 25.72 ml/m  RA Volume:   15.80 ml 8.43 ml/m LA Vol (A4C):   44.9 ml 23.96 ml/m LA Biplane Vol: 47.8 ml 25.51 ml/m AORTIC VALVE AV Vmax:      383.00 cm/s AV Vmean:     251.000 cm/s AV VTI:       0.737 m AV Peak Grad: 58.7 mmHg AV Mean Grad: 30.0 mmHg  AORTA Ao Root diam: 3.10 cm  MITRAL VALVE MV Area (PHT): 2.27 cm     SHUNTS MV Decel Time: 335 msec     Systemic Diam: 1.80 cm MV E velocity: 79.25 cm/s MV A velocity: 119.00 cm/s MV E/A ratio:  0.67  Vina Gull MD Electronically  signed by Vina Gull MD Signature Date/Time: 04/06/2024/4:02:55 PM    Final      CT SCANS  CT CORONARY MORPH W/CTA COR W/SCORE 12/04/2021  Addendum 12/04/2021  2:41 PM ADDENDUM REPORT: 12/04/2021 14:38  EXAM: OVER-READ INTERPRETATION  CT CHEST  The following report is an over-read performed by radiologist Dr. Marcey Diones [facility] on 12/04/2021. This over-read does not include interpretation of cardiac or coronary anatomy or pathology. The coronary CTA interpretation by the cardiologist is attached.  COMPARISON:  None.  FINDINGS: No significant noncardiac vascular findings. Visualized mediastinum and hilar regions demonstrate no lymphadenopathy or masses. Hiatal hernia contains fat and some of the proximal stomach. Visualized lungs show no evidence of pulmonary edema, consolidation, pneumothorax, nodule or pleural fluid. Visualized liver demonstrates evidence of steatosis. Visualized bony structures are unremarkable.  IMPRESSION: 1. Hiatal hernia. 2. Hepatic steatosis.   Electronically Signed By: Marcey Moan M.D. On: 12/04/2021 14:38  Narrative CLINICAL DATA:  Chest pain  EXAM: Cardiac CTA  MEDICATIONS: Sub lingual nitro. 4mg  and lopressor  100mg  Cardizem  120 mg  TECHNIQUE: The patient was scanned on a Siemens Force 192 slice scanner. Gantry rotation speed was 250 msecs. Collimation was .6 mm. A 100 kV prospective scan was triggered in the ascending thoracic aorta at 140 HU's Full mA was used between 35% and 75% of the R-R interval. Average HR during the scan was 56 bpm. The 3D data set was interpreted on a dedicated work station using MPR, MIP and VRT modes. A total of 80 cc of contrast was used.  FINDINGS: Non-cardiac: See separate report from [facility]. No significant findings on limited lung and soft tissue windows.  Calcium  Score: No calcium  noted  Coronary Arteries: Right dominant with no anomalies  LM: Normal  side by side LCX/LAD ostia  IM: Small vessel normal  LAD: Normal  D1: Normal  D2: Normal  D3: Normal  Circumflex: Normal  OM1: Normal  OM2: Normal  RCA: Normal  PDA: Normal  PLA: Normal  IMPRESSION: 1. Calcium  score 0  2.  CAD RADS 0 normal right dominant coronary arteries  3.  Normal ascending thoracic aorta  3.3 cm  Maude Emmer  Electronically Signed: By: Maude Emmer M.D. On: 12/04/2021 14:08   CARDIAC MRI  MR CARDIAC MORPHOLOGY W WO CONTRAST 06/16/2024  Narrative CLINICAL DATA:  Hypertrophic Cardiomyopathy  EXAM: CARDIAC MRI  TECHNIQUE: The patient was scanned on a 1.5 Tesla Siemens magnet. A dedicated cardiac coil was used. Functional imaging was done using Fiesta sequences. 2,3, and 4 chamber views were done to assess for RWMA's. Modified Simpson's rule using a short axis stack was used to calculate an ejection fraction on a dedicated work [employer]. The patient received 10 cc of Gadavist . After 10 minutes inversion recovery sequences were used to assess for infiltration and scar tissue.  CONTRAST:  Gadavist   FINDINGS: Moderate LAE. Normal RA. No PFO/ASD. Lipomatous hypertrophy of the atrial septum. Normal RV size and function. Normal TV/PV. Sievers type 0 bicuspid AV. AVA by planimetry 1.8 cm2 consistent with mild aortic valvular stenosis. No significant AR. Normal ascending thoracic aorta 3.4 cm. Mitral valve is thickened with SAM Mild appearing MR with regurgitant fraction 13% consistent with mild MR. There is significant turbulence in the LVOT consistent with sub valvular gradient. Suggest echo correlation. Severe septal hypertrophy 22 mm compared to posterior wall 11 mm. Findings consistent with hypertrophic cardiomyopathy  Quantitative LVEF: 61% (EDV 105 cc, ESV 41 cc SV 64 cc) Estimated cardiac output using flow analysis 2.3 L/min Flow analysis curve shows bifid waveform consistent with LVOT  gradient  Quantitative RVEF: 67% (EDV 88 cc ESV 29 cc SV 59 cc )  Delayed enhancement images with gadolinium show no infarct, infiltration, or scarring. Normal myocardial nulling Not consistent with amyloid  Parametric Measures: Using Hct of 44 non current  T1: Mildly elevated 1031 msec  ZRC:Fpoiob elevated 34%  T2: Normal 48 msec  IMPRESSION: 1. Severe septal hypertrophy 22 mm with SAM and LVOT gradient. Findings consistent with hypertrophic cardiomyopathy  2. Since MRI done 01/14/22 myocardial mass has increased from 55g/m2 to 72g/m2  3. Despite severe thickening delayed enhancement with gadolinium shows no uptake  4. Images not suggestive of amyloid with normal nulling of the myocardium  5.  Mild MR with SAM Regurgitant fraction 13%  6. Sievers type 0 bicuspid AV with no AR and mild AS AVA by planimetry 1.8 cm2  7. Mildly elevated T1/ECV consistent with hypertrophic cardiomyopathy Normal T2 suggesting no myocarditis  8. Decreased cardiac output 2.3 L/min with bifid waveform also consistent with outflow tract gradient Suggest echo correlation  9.  Moderate LAE  10.  Lipomatous hypertrophy of atrial septum  11.  Normal RV size and function RVEF 67%  Maude Emmer   Electronically Signed By: Maude Emmer M.D. On: 06/16/2024 17:07   ______________________________________________________________________________________________      Physical Exam:    VS:  BP 120/76   Pulse 82   Ht 5' 2 (1.575 m)   Wt 186 lb (84.4 kg)   SpO2 96%   BMI 34.02 kg/m    Wt Readings from Last 3 Encounters:  07/19/24 186 lb (84.4 kg)  07/05/24 186 lb (84.4 kg)  05/11/24 188 lb (85.3 kg)    Gen: no distress   Neck: No JVD Cardiac: No Rubs or Gallops, harsh systolic murmur, RRR +2 radial pulses Respiratory: Clear to auscultation bilaterally, normal effort, normal  respiratory rate GI: Soft, nontender, non-distended  MS: No  edema;  moves all extremities Integument: Skin  feels warm Neuro:  At time of evaluation, alert and oriented to person/place/time/situation  Psych: Normal  affect, patient feels ok     ASSESSMENT AND PLAN: .    Hypertrophic Cardiomyopathy  - Reverse Curve Variant - peak gradient 86 mm Hg on Valsalva  - With Siever's 0 bicuspid valve and no AS, no AI;  - suspicion of Fabry's/Danon/Noonan's or other mimics of HCM: low - Gene variant: Pending - NYHA III  - Non HCM Contributors to disease/status  Bicuspid Aortic Valve She has a bicuspid aortic valve without significant aortic stenosis, aortic regurgitation, or an aortic aneurysm. This condition is not contributing to her current symptoms. Screening her children for bicuspid aortic valve is important due to its hereditary nature. - Advised screening echocardiograms for her children to check for bicuspid aortic valve (can be done in [place] )  Hypertension - She has hypertension managed with diltiazem . Her medication regimen was reviewed, and potential adjustments were discussed based on her current symptoms and treatment plan for hypertrophic cardiomyopathy (BB therapy)  Obstructive Sleep Apnea She has obstructive sleep apnea, relevant to her overall cardiovascular health and her fatigue; I do not suspect complete resolution of all of her sx with HCM thearpy  Family history, Discussed family screening  - offered her genetic testing; she has deferred clinical genetic testing visit; I will reach out to Dr. Delinda about possible genetic testing based on her testing from [facility]  SCD  Assessment - heart monitor pending for her palpitations  Atrial fibrillation Assessment  - HCM-AF score 17 - Atrial arrhythmia management: Heart monitoring  Medication symptom plan - She has obstructive hypertrophic cardiomyopathy characterized by a thickened heart muscle obstructing blood flow, causing dizziness upon standing, chest heaviness, fatigue, shortness of breath, and palpitations. This  condition likely contributes to her heart murmur and symptoms. Treatment options discussed include medications, septal myectomy, and alcohol septal ablation. She prefers minimizing medication use and was informed about potential surgical intervention if symptoms persist. Metoprolol  was chosen to alleviate nocturnal palpitations and improve sleep. If medication is ineffective, septal myectomy is considered a more definitive treatment. Alcohol septal ablation was discussed but deemed less suitable for her. - Prescribe metoprolol  succinate 25 mg PO nightly. - Order a two-week non-live heart monitor. - Schedule a transesophageal echocardiogram (TEE) in December. - Prepare for potential septal myectomy if symptoms do not improve with medication (would add same day cath - hx of cerebral aneurysm s/p clip (not immediate surgical barrier)   F/u with [facility] if sx resolve Otherwise plan for SRT  Time:   I have spent a total of 66 minutes with the patient reviewing notes, imaging, EKGs, labs, and examining the patient as well as establishing an assessment and plan that was discussed personally with the patient. Discussed disease state education , using shared decision making tools and cardiac modeling , and reviewing medication management vs SRT. Reaching out to our genetics collaborators for [employer] genetic testing    Stanly Leavens, MD FASE [facility] Cardiologist [facility]  [address], [address] [place], [place] [address] [phone]  4:02 PM

## 2024-07-19 NOTE — Patient Instructions (Addendum)
 Medication Instructions:  Your physician has recommended you make the following change in your medication:   1) START taking Toprol  XL (metoprolol  succinate) 25 mg once every night   *If you need a refill on your cardiac medications before your next appointment, please call your pharmacy*  Lab Work: BMET and CBC - please have these done anytime after November 20th at any [facility] location.   Testing/Procedures: Event Monitor  Your physician has recommended that you wear an event monitor. Event monitors are medical devices that record the heart's electrical activity. Doctors most often us  these monitors to diagnose arrhythmias. Arrhythmias are problems with the speed or rhythm of the heartbeat. The monitor is a small, portable device. You can wear one while you do your normal daily activities. This is usually used to diagnose what is causing palpitations/syncope (passing out).  Transesophageal Echocardiogram Your physician has requested that you have a TEE. During a TEE, sound waves are used to create images of your heart. It provides your doctor with information about the size and shape of your heart and how well your heart's chambers and valves are working. In this test, a transducer is attached to the end of a flexible tube that's guided down your throat and into your esophagus (the tube leading from you mouth to your stomach) to get a more detailed image of your heart. You are not awake for the procedure. Please see the instruction sheet given to you today. For further information please visit [email or web address]  Follow-Up: At [facility], you and your health needs are our priority.  As part of our continuing mission to provide you with exceptional heart care, our providers are all part of one team.  This team includes your primary Cardiologist (physician) and Advanced Practice Providers or APPs (Physician Assistants and Nurse Practitioners) who all work together to provide you with  the care you need, when you need it.  Your next appointment:   January 2026  Provider:   Dr. Santo GEOFFRY HEWS- Long Term Monitor Instructions  Your physician has requested you wear a ZIO patch monitor for 14 days.  This is a single patch monitor. [employer] supplies one patch monitor per enrollment. Additional stickers are not available. Please do not apply patch if you will be having a Nuclear Stress Test,  Echocardiogram, Cardiac CT, MRI, or Chest Xray during the period you would be wearing the  monitor. The patch cannot be worn during these tests. You cannot remove and re-apply the  ZIO XT patch monitor.  Your ZIO patch monitor will be mailed 3 day USPS to your address on file. It may take 3-5 days  to receive your monitor after you have been enrolled.  Once you have received your monitor, please review the enclosed instructions. Your monitor  has already been registered assigning a specific monitor serial # to you.  Billing and Patient Assistance Program Information  We have supplied [employer] with any of your insurance information on file for billing purposes. [employer] offers a sliding scale Patient Assistance Program for patients that do not have  insurance, or whose insurance does not completely cover the cost of the ZIO monitor.  You must apply for the Patient Assistance Program to qualify for this discounted rate.  To apply, please call [employer] at [phone], select option 4, select option 2, ask to apply for  Patient Assistance Program. Meredeth will ask your household income, and how many people  are in your household. They will quote your out-of-pocket  cost based on that information.  [employer] will also be able to set up a 22-month, interest-free payment plan if needed.  Applying the monitor   Shave hair from upper left chest.  Hold abrader disc by orange tab. Rub abrader in 40 strokes over the upper left chest as  indicated in your monitor instructions.  Clean area  with 4 enclosed alcohol pads. Let dry.  Apply patch as indicated in monitor instructions. Patch will be placed under collarbone on left  side of chest with arrow pointing upward.  Rub patch adhesive wings for 2 minutes. Remove white label marked 1. Remove the white  label marked 2. Rub patch adhesive wings for 2 additional minutes.  While looking in a mirror, press and release button in center of patch. A small green light will  flash 3-4 times. This will be your only indicator that the monitor has been turned on.  Do not shower for the first 24 hours. You may shower after the first 24 hours.  Press the button if you feel a symptom. You will hear a small click. Record Date, Time and  Symptom in the Patient Logbook.  When you are ready to remove the patch, follow instructions on the last 2 pages of Patient  Logbook. Stick patch monitor onto the last page of Patient Logbook.  Place Patient Logbook in the blue and white box. Use locking tab on box and tape box closed  securely. The blue and white box has prepaid postage on it. Please place it in the mailbox as  soon as possible. Your physician should have your test results approximately 7 days after the  monitor has been mailed back to [facility].  Call [facility] Customer Care at [phone] if you have questions regarding  your ZIO XT patch monitor. Call them immediately if you see an orange light blinking on your  monitor.  If your monitor falls off in less than 4 days, contact our Monitor department at [phone].  If your monitor becomes loose or falls off after 4 days call [employer] at [phone] for  suggestions on securing your monitor

## 2024-07-27 DIAGNOSIS — R400 Somnolence: Secondary | ICD-10-CM | POA: Diagnosis not present

## 2024-07-28 ENCOUNTER — Encounter: Payer: Self-pay | Admitting: Internal Medicine

## 2024-08-09 ENCOUNTER — Ambulatory Visit: Admitting: Nurse Practitioner

## 2024-08-10 ENCOUNTER — Encounter: Payer: Self-pay | Admitting: Nurse Practitioner

## 2024-08-10 ENCOUNTER — Ambulatory Visit: Admitting: Nurse Practitioner

## 2024-08-10 VITALS — BP 118/80 | HR 87 | Ht 62.0 in | Wt 185.2 lb

## 2024-08-10 DIAGNOSIS — Z7985 Long-term (current) use of injectable non-insulin antidiabetic drugs: Secondary | ICD-10-CM | POA: Diagnosis not present

## 2024-08-10 DIAGNOSIS — E559 Vitamin D deficiency, unspecified: Secondary | ICD-10-CM | POA: Diagnosis not present

## 2024-08-10 DIAGNOSIS — E782 Mixed hyperlipidemia: Secondary | ICD-10-CM | POA: Diagnosis not present

## 2024-08-10 DIAGNOSIS — I10 Essential (primary) hypertension: Secondary | ICD-10-CM | POA: Diagnosis not present

## 2024-08-10 DIAGNOSIS — E1140 Type 2 diabetes mellitus with diabetic neuropathy, unspecified: Secondary | ICD-10-CM | POA: Diagnosis not present

## 2024-08-10 LAB — POCT GLYCOSYLATED HEMOGLOBIN (HGB A1C): Hemoglobin A1C: 5.9 % — AB (ref 4.0–5.6)

## 2024-08-10 MED ORDER — OZEMPIC (2 MG/DOSE) 8 MG/3ML ~~LOC~~ SOPN: 2.0000 mg | PEN_INJECTOR | SUBCUTANEOUS | 3 refills | Status: AC

## 2024-08-10 MED ORDER — ONETOUCH ULTRA VI STRP: ORAL_STRIP | 6 refills | Status: AC

## 2024-08-10 NOTE — Progress Notes (Signed)
 Endocrinology Follow Up Note       08/10/2024, 12:00 PM   Subjective:    Patient ID: Monique Warren, female    DOB: 01/13/1951.  Monique Warren is being seen in follow up after being seen in consultation for management of currently uncontrolled symptomatic diabetes requested by  Monique Suzzane POUR, MD.   Past Medical History:  Diagnosis Date  . Allergy   . Anxiety   . Arthritis   . Asthma   . Cataract    Both eyes, but have been removed  . Celiac disease   . Depression   . Diabetes mellitus, type II (HCC)   . Diabetic neuropathy (HCC)   . Diastolic dysfunction    Grade 1 with preserved EF  . Dysrhythmia   . GERD (gastroesophageal reflux disease)   . Heart murmur   . Hiatal hernia    Sliding  . Hyperlipidemia   . Hypertension   . Migraine   . Obstructive sleep apnea   . Osteoporosis   . Sleep apnea   . Vertigo     Past Surgical History:  Procedure Laterality Date  . ABDOMINAL HYSTERECTOMY  09/29/2003  . APPENDECTOMY    . BALLOON DILATION N/A 02/08/2022   Procedure: BALLOON DILATION;  Surgeon: Monique Carlin POUR, Warren;  [facility];  Service: Endoscopy;  Laterality: N/A;  . BIOPSY  02/08/2022   Procedure: BIOPSY;  Surgeon: Monique Carlin POUR, Warren;  [facility];  Service: Endoscopy;;  . BIOPSY  07/11/2023   Procedure: BIOPSY;  Surgeon: Monique Carlin POUR, Warren;  [facility];  Service: Endoscopy;;  . BRAIN SURGERY    . BREAST BIOPSY Left   . CARPAL TUNNEL RELEASE    . CATARACT EXTRACTION Bilateral    October and November 2016  . CESAREAN SECTION    . COLONOSCOPY  09/2017   Monique GORMAN Colla, MD in [place]; 5 mm tubular adenoma in the descending colon s/p resected, mild sigmoid diverticulosis, internal hemorrhoids.  Recommended repeat colonoscopy in 5 years.  . COLONOSCOPY WITH Warren  N/A 07/11/2023   Surgeon: Monique Carlin POUR, Warren; Nonbleeding internal hemorrhoids,  diverticulosis in the sigmoid colon, 5 mm polyp in the transverse colon resected and retrieved, 8 mm polyp in the sigmoid colon resected and retrieved, single nonbleeding angiodysplastic lesion.  Pathology with 1 tubular adenoma and 1 hyperplastic polyp.  Recommended 7-year surveillance.  . CRANIOTOMY FOR ANEURYSM / VERTEBROBASILAR / CAROTID CIRCULATION Right 10/24/2015  . ESOPHAGOGASTRODUODENOSCOPY  09/2017   [name] ; irregular Z-line s/p biopsy, decreased LES tone, 4 cm hiatal hernia, erythema in gastric antrum biopsied, normal examined duodenum biopsied.  Esophageal biopsy suggestive of reflux, no Barrett's, gastric biopsy with nonspecific chronic gastritis with intestinal metaplasia, duodenal biopsy with increased intraepithelial lymphocytes without blunting of villi, nonspecific.  Monique Warren  N/A 02/08/2022   Surgeon: Monique Carlin POUR, Warren; 2 cm hiatal hernia, short segment Barrett's esophagus without dysplasia, mild Schatzki's ring dilated, gastritis with biopsies benign, normal examined duodenum.  Repeat EGD in 5 years.  . ESOPHAGOGASTRODUODENOSCOPY (EGD) WITH Warren  N/A 07/11/2023   Surgeon: Monique Warren;  cm hiatal hernia, mucosal changes secondary to short  segment Barrett's esophagus biopsied, esophageal mucosal changes suspicious for EOE biopsied and dilated, gastritis biopsied. Gastric biopsies benign, all esophageal biopsies benign.  Monique EYE SURGERY    . JOINT REPLACEMENT    . KNEE ARTHROSCOPY WITH MEDIAL MENISECTOMY Left 04/09/2022   Procedure: KNEE ARTHROSCOPY WITH PARTIAL MEDIAL MENISCECTOMY, LATERAL MENISCAL DEBRIDEMENT;  Surgeon: Monique Taft BRAVO, MD;  Location: [facility];  Service: Orthopedics;  Laterality: Left;  . KNEE ARTHROSCOPY WITH MENISCAL REPAIR Left 04/09/2022   Procedure: KNEE ARTHROSCOPY WITH MEDIAL MENISCAL REPAIR;  Surgeon: Monique Taft BRAVO, MD;  Location: [facility];  Service: Orthopedics;  Laterality: Left;  . LAPAROSCOPY     . POLYPECTOMY  07/11/2023   Procedure: POLYPECTOMY;  Surgeon: Monique Carlin POUR, Warren;  [facility];  Service: Endoscopy;;  . TOTAL KNEE ARTHROPLASTY Right 12/06/2022   Procedure: TOTAL KNEE ARTHROPLASTY;  Surgeon: Monique Taft BRAVO, MD;  Location: [facility];  Service: Orthopedics;  Laterality: Right;  . TUBAL LIGATION    . UTERINE FIBROID SURGERY    . WRIST SURGERY Bilateral     Social History   Socioeconomic History  . Marital status: Married    Spouse name: Not on file  . Number of children: Not on file  . Years of education: Not on file  . Highest education level: GED or equivalent  Occupational History  . Not on file  Tobacco Use  . Smoking status: Former    Current packs/day: 0.50    Types: Cigarettes    Passive exposure: Past  . Smokeless tobacco: Never  Vaping Use  . Vaping status: Never Used  Substance and Sexual Activity  . Alcohol use: Not Currently    Comment: rarely  . Drug use: Never  . Sexual activity: Not on file  Other Topics Concern  . Not on file  Social History Narrative  . Not on file   Social Drivers of Health   Financial Resource Strain: Low Risk  (07/01/2024)   Overall Financial Resource Strain (CARDIA)   . Difficulty of Paying Living Expenses: Not very hard  Food Insecurity: No Food Insecurity (07/01/2024)   Hunger Vital Sign   . Worried About Programme Researcher, Broadcasting/film/video in the Last Year: Never true   . Ran Out of Food in the Last Year: Never true  Transportation Needs: No Transportation Needs (07/01/2024)   PRAPARE - Transportation   . Lack of Transportation (Medical): No   . Lack of Transportation (Non-Medical): No  Physical Activity: Inactive (07/01/2024)   Exercise Vital Sign   . Days of Exercise per Week: 0 days   . Minutes of Exercise per Session: Not on file  Stress: Stress Concern Present (07/01/2024)   [employer] of Occupational Health - Occupational Stress Questionnaire   . Feeling of Stress: To some extent  Social  Connections: Moderately Integrated (07/01/2024)   Social Connection and Isolation Panel   . Frequency of Communication with Friends and Family: More than three times a week   . Frequency of Social Gatherings with Friends and Family: More than three times a week   . Attends Religious Services: More than 4 times per year   . Active Member of Clubs or Organizations: No   . Attends Banker Meetings: Not on file   . Marital Status: Married    Family History  Problem Relation Age of Onset  . Breast cancer Mother   . Thyroid disease Mother   . Stroke Mother   . Arthritis Mother   .  Depression Mother   . Hyperlipidemia Mother   . Hypertension Father   . Hyperlipidemia Father   . Heart attack Father   . Heart failure Father   . Alcohol abuse Father   . Heart disease Father   . Early death Brother   . Breast cancer Paternal Grandmother   . ADD / ADHD Daughter   . Cancer - Colon Neg Hx   . Gastric cancer Neg Hx   . Esophageal cancer Neg Hx   . Liver cancer Neg Hx   . Autoimmune disease Neg Hx     Outpatient Encounter Medications as of 08/10/2024  Medication Sig  . acetaminophen  (TYLENOL ) 500 MG tablet Take 1 tablet (500 mg total) by mouth every 6 (six) hours as needed for moderate pain.  . AIMOVIG  140 MG/ML SOAJ INJECT 1 PEN UNDER THE SKIN 1 TIME MONTHLY  . albuterol  (VENTOLIN  HFA) 108 (90 Base) MCG/ACT inhaler USE 2 INHALATIONS ORALLY   EVERY 6 HOURS AS NEEDED FORWHEEZING OR SHORTNESS OF   BREATH  . atorvastatin  (LIPITOR) 80 MG tablet Take 1 tablet (80 mg total) by mouth at bedtime.  . Blood Glucose Monitoring Suppl (ONE TOUCH ULTRA 2) w/Device KIT USE TO CHECK BLOOD GLUCOSE 2 TIMES A DAY  . Cholecalciferol  (VITAMIN D3) 125 MCG (5000 UT) capsule Take 5,000 Units by mouth daily.  . diltiazem  (CARDIZEM  SR) 120 MG 12 hr capsule Take 2 capsules (240 mg total) by mouth daily.  . DULoxetine  (CYMBALTA ) 60 MG capsule TAKE 1 CAPSULE DAILY  . Estradiol  10 MCG TABS vaginal tablet  Place 10 mcg vaginally every Monday.  . famotidine  (PEPCID ) 40 MG tablet Take 40 mg by mouth daily.  . Fluticasone -Salmeterol,sensor, (AIRDUO DIGIHALER ) 232-14 MCG/ACT AEPB Inhale 1 Inhalation into the lungs 2 (two) times daily. USE 1 INHALATION TWICE A DAY Strength: 232-14 MCG/ACT  . gabapentin  (NEURONTIN ) 300 MG capsule TAKE 1 CAPSULE AT BEDTIME  . Lancets (ONETOUCH DELICA PLUS LANCET33G) MISC Use to check glucose once daily  . LINZESS  72 MCG capsule TAKE 1 CAPSULE DAILY BEFOREBREAKFAST  . meclizine  (ANTIVERT ) 25 MG tablet Take 1 tablet (25 mg total) by mouth 3 (three) times daily as needed for dizziness.  . methocarbamol  (ROBAXIN ) 500 MG tablet Take 500 mg by mouth 4 (four) times daily.  . metoprolol  succinate (TOPROL  XL) 25 MG 24 hr tablet Take 1 tablet (25 mg total) by mouth at bedtime.  . Multiple Vitamin (MULTIVITAMIN WITH MINERALS) TABS tablet Take 1 tablet by mouth daily.  . PROLIA  60 MG/ML SOSY injection INJECT 60 MG UNDER THE SKIN EVERY 6 MONTHS  . promethazine  (PHENERGAN ) 12.5 MG tablet Take 1 tablet (12.5 mg total) by mouth every 6 (six) hours as needed for nausea or vomiting.  . traMADol  (ULTRAM ) 50 MG tablet TAKE 1 TABLET BY MOUTH EVERY 12 HOURS AS NEEDED  . triamcinolone  cream (KENALOG ) 0.1 % Apply 1 Application topically 2 (two) times daily.  . venlafaxine  (EFFEXOR ) 75 MG tablet TAKE 1 TABLET TWICE A DAY  . Vonoprazan Fumarate  (VOQUEZNA ) 20 MG TABS Take 20 mg by mouth daily.  Monique zolmitriptan (ZOMIG) 5 MG tablet Take 5 mg by mouth daily as needed for migraine.  . [DISCONTINUED] ONETOUCH ULTRA test strip Use to monitor glucose once daily  . [DISCONTINUED] OZEMPIC , 2 MG/DOSE, 8 MG/3ML SOPN INJECT 2MG  ONCE A WEEK AS  DIRECTED  . ONETOUCH ULTRA test strip Use to monitor glucose once daily  . Semaglutide , 2 MG/DOSE, (OZEMPIC , 2 MG/DOSE,) 8 MG/3ML SOPN Inject 2  mg into the skin once a week.   Facility-Administered Encounter Medications as of 08/10/2024  Medication  .  bupivacaine -meloxicam  ER (ZYNRELEF ) injection 400 mg  . bupivacaine -meloxicam  ER (ZYNRELEF ) injection 400 mg  . denosumab  (PROLIA ) injection 60 mg    ALLERGIES: Allergies  Allergen Reactions  . Cefuroxime Anaphylaxis, Hives, Shortness Of Breath, Swelling and Anxiety  . Codeine Hives, Shortness Of Breath, Nausea And Vomiting, Swelling and Anxiety  . Morphine  Anaphylaxis  . Sulfa Antibiotics Anaphylaxis, Hives, Nausea And Vomiting and Swelling    Swollen throat  . Sulfamethoxazole-Trimethoprim Shortness Of Breath, Swelling and Anxiety    Throat swelling   . Gluten Meal     Severe reflux - celiac disease    VACCINATION STATUS: Immunization History  Administered Date(s) Administered  . Fluad Quad(high Dose 65+) 07/02/2021, 06/05/2022  . Fluad Trivalent(High Dose 65+) 06/16/2023  . INFLUENZA, HIGH DOSE SEASONAL PF 09/22/2018, 07/05/2024  . Influenza Split 07/26/2014  . Influenza-Unspecified 07/26/2014  . PFIZER(Purple Top)SARS-COV-2 Vaccination 12/01/2019, 12/29/2019  . Pneumococcal Conjugate-13 08/02/2019  . Pneumococcal Polysaccharide-23 07/28/2020  . Tdap 09/30/2016  . Zoster Recombinant(Shingrix) 06/03/2018, 11/10/2019    Diabetes She presents for her follow-up diabetic visit. She has type 2 diabetes mellitus. Onset time: Diagnosed at approx age of 63. Her disease course has been improving. There are no hypoglycemic associated symptoms. Associated symptoms include foot paresthesias. Pertinent negatives for diabetes include no polyuria and no weight loss. There are no hypoglycemic complications. Symptoms are stable. Diabetic complications include nephropathy and peripheral neuropathy. Risk factors for coronary artery disease include diabetes mellitus, dyslipidemia, family history, hypertension, obesity, post-menopausal and sedentary lifestyle. Current diabetic treatments: Ozempic  only. She is compliant with treatment all of the time. Her weight is fluctuating minimally. She is  following a generally healthy diet. When asked about meal planning, she reported none. She has not had a previous visit with a dietitian. She participates in exercise intermittently. Her home blood glucose trend is decreasing steadily. Her breakfast blood glucose range is generally 130-140 mg/dl. (She presents today with her meter showing at goal glycemic profile overall.  Her POCT A1c today is 5.9%,unchanged from last visit.  She is tolerating the Ozempic  well with mild constipation, relived by Linzess .  She notes she is having some cardiac workup done soon.) An ACE inhibitor/angiotensin II receptor blocker is being taken. She does not see a podiatrist.Eye exam is current (has eye exam later today).     Review of systems  Constitutional: + stable body weight, current Body mass index is 33.87 kg/m., no fatigue, no subjective hyperthermia, no subjective hypothermia Eyes: no blurry vision, no xerophthalmia ENT: no sore throat, no nodules palpated in throat, no dysphagia/odynophagia, no hoarseness Cardiovascular: no chest pain, no shortness of breath, no palpitations, no leg swelling Respiratory: no cough, no shortness of breath Gastrointestinal: no nausea/vomiting/diarrhea, + constipation Musculoskeletal: + diffuse muscle/joint aches Skin: no rashes, no hyperemia Neurological: no tremors, + numbness/tingling to BLE, no dizziness Psychiatric: no depression, no anxiety  Objective:     BP 118/80 (BP Location: Left Arm, Patient Position: Sitting, Cuff Size: Large)   Pulse 87   Ht 5' 2 (1.575 m)   Wt 185 lb 3.2 oz (84 kg)   BMI 33.87 kg/m   Wt Readings from Last 3 Encounters:  08/10/24 185 lb 3.2 oz (84 kg)  07/19/24 186 lb (84.4 kg)  07/05/24 186 lb (84.4 kg)     BP Readings from Last 3 Encounters:  08/10/24 118/80  07/19/24 120/76  07/05/24 136/87  Physical Exam- Limited  Constitutional:  Body mass index is 33.87 kg/m. , not in acute distress, normal state of mind Eyes:   EOMI, no exophthalmos Musculoskeletal: no gross deformities, strength intact in all four extremities, no gross restriction of joint movements Skin:  no rashes, no hyperemia Neurological: no tremor with outstretched hands    Diabetic Foot Exam - Simple   No data filed     CMP ( most recent) CMP     Component Value Date/Time   NA 140 07/05/2024 1424   K 4.5 07/05/2024 1424   CL 107 (H) 07/05/2024 1424   CO2 18 (L) 07/05/2024 1424   GLUCOSE 135 (H) 07/05/2024 1424   GLUCOSE 181 (H) 12/07/2022 0557   BUN 13 07/05/2024 1424   CREATININE 0.60 07/05/2024 1424   CALCIUM  8.6 (L) 07/05/2024 1424   PROT 5.9 (L) 07/05/2024 1424   ALBUMIN 4.2 07/05/2024 1424   AST 30 07/05/2024 1424   ALT 28 07/05/2024 1424   ALKPHOS 99 07/05/2024 1424   BILITOT 0.5 07/05/2024 1424   GFRNONAA >60 12/07/2022 0557     Diabetic Labs (most recent): Lab Results  Component Value Date   HGBA1C 5.9 (A) 08/10/2024   HGBA1C 5.9 (A) 04/06/2024   HGBA1C 6.6 (A) 12/16/2023     Lipid Panel ( most recent) Lipid Panel     Component Value Date/Time   CHOL 189 07/05/2024 1424   TRIG 133 07/05/2024 1424   HDL 45 07/05/2024 1424   CHOLHDL 4.2 07/05/2024 1424   LDLCALC 120 (H) 07/05/2024 1424   LABVLDL 24 07/05/2024 1424      Lab Results  Component Value Date   TSH 2.140 07/05/2024   TSH 1.520 06/16/2023   TSH 1.780 06/05/2022   TSH 2.830 10/02/2021   FREET4 1.08 10/02/2021           Assessment & Plan:   1) Type 2 diabetes mellitus with diabetic neuropathy, without long-term current use of insulin (HCC)  She presents today with her meter showing at goal glycemic profile overall.  Her POCT A1c today is 5.9%,unchanged from last visit.  She is tolerating the Ozempic  well with mild constipation, relived by Linzess .  She notes she is having some cardiac workup done soon.  - Monique Warren has currently uncontrolled symptomatic type 2 DM since 73 years of age.  -Recent labs reviewed.  - I  had a long discussion with her about the progressive nature of diabetes and the pathology behind its complications. -her diabetes is complicated by mild CKD, peripheral neuropathy and she remains at a high risk for more acute and chronic complications which include CAD, CVA, CKD, retinopathy, and neuropathy. These are all discussed in detail with her.  - Nutritional counseling repeated/built upon at each appointment.  - The patient admits there is a room for improvement in their diet and drink choices. -  Suggestion is made for the patient to avoid simple carbohydrates from their diet including Cakes, Sweet Desserts / Pastries, Ice Cream, Soda (diet and regular), Sweet Tea, Candies, Chips, Cookies, Sweet Pastries, Store Bought Juices, Alcohol in Excess of 1-2 drinks a day, Artificial Sweeteners, Coffee Creamer, and Sugar-free Products. This will help patient to have stable blood glucose profile and potentially avoid unintended weight gain.   - I encouraged the patient to switch to unprocessed or minimally processed complex starch and increased protein intake (animal or plant source), fruits, and vegetables.   - Patient is advised to stick to a routine mealtimes  to eat 3 meals a day and avoid unnecessary snacks (to snack only to correct hypoglycemia).  - I have approached her with the following individualized plan to manage her diabetes and patient agrees:   -She can continue her Ozempic  2 mg SQ weekly given her excellent control.  -she is advised to check glucose once daily, before breakfast and to call the clinic if she has readings less than 70 or above 300 for 3 tests in a row.  She did not care for the Dexcom CGM, says it alarmed to much for her.  She prefers to Warren traditional fingersticks.  - She does not tolerate Metformin due to GI upset (r/t her Celiac disease) and has allergy to sulfa meds.  - Specific targets for  A1c; LDL, HDL, and Triglycerides were discussed with the patient.  2)  Blood Pressure /Hypertension:  her blood pressure is controlled to target.   she is advised to continue her current medications including Lisinopril  20 mg p.o. daily with breakfast.  3) Lipids/Hyperlipidemia:    Her most recent lipid panel from 07/05/24 shows uncontrolled LDL of 120 (improving).  Her PCP counseled her on this and advised she restart it which she has.  She is advised to continue Lipitor 80 mg po daily at bedtime.  Side effects and precautions discussed with her.    4)  Weight/Diet:  her Body mass index is 33.87 kg/m.  -  clearly complicating her diabetes care.   she is a candidate for weight loss. I discussed with her the fact that loss of 5 - 10% of her  current body weight will have the most impact on her diabetes management.  Exercise, and detailed carbohydrates information provided  -  detailed on discharge instructions.  5) Chronic Care/Health Maintenance: -she is on ACEI/ARB and Statin medications and is encouraged to initiate and continue to follow up with Ophthalmology, Dentist, Podiatrist at least yearly or according to recommendations, and advised to stay away from smoking. I have recommended yearly flu vaccine and pneumonia vaccine at least every 5 years; moderate intensity exercise for up to 150 minutes weekly; and sleep for at least 7 hours a day.  - she is advised to maintain close follow up with Monique Suzzane POUR, MD for primary care needs, as well as her other providers for optimal and coordinated care.      I spent  26 minutes in the care of the patient today including review of labs from CMP, Lipids, Thyroid Function, Hematology (current and previous including abstractions from other facilities); face-to-face time discussing  her blood glucose readings/logs, discussing hypoglycemia and hyperglycemia episodes and symptoms, medications doses, her options of short and long term treatment based on the latest standards of care / guidelines;  discussion about  incorporating lifestyle medicine;  and documenting the encounter. Risk reduction counseling performed per USPSTF guidelines to reduce obesity and cardiovascular risk factors.     Please refer to Patient Instructions for Blood Glucose Monitoring and Insulin/Medications Dosing Guide  in media tab for additional information. Please  also refer to  Patient Self Inventory in the Media  tab for reviewed elements of pertinent patient history.  Monique Warren participated in the discussions, expressed understanding, and voiced agreement with the above plans.  All questions were answered to her satisfaction. she is encouraged to contact clinic should she have any questions or concerns prior to her return visit.     Follow up plan: - Return in about 6 months (around 02/07/2025)  for Diabetes F/U with A1c in office, No previsit labs.  Benton Rio, [facility] [address] [place], [place] [address] Phone: [phone] Fax: [phone]  08/10/2024, 12:00 PM

## 2024-08-16 DIAGNOSIS — I422 Other hypertrophic cardiomyopathy: Secondary | ICD-10-CM | POA: Diagnosis not present

## 2024-08-16 DIAGNOSIS — R002 Palpitations: Secondary | ICD-10-CM | POA: Diagnosis not present

## 2024-08-17 DIAGNOSIS — I422 Other hypertrophic cardiomyopathy: Secondary | ICD-10-CM | POA: Diagnosis not present

## 2024-08-17 DIAGNOSIS — R002 Palpitations: Secondary | ICD-10-CM

## 2024-08-18 ENCOUNTER — Ambulatory Visit: Payer: Self-pay | Admitting: Internal Medicine

## 2024-08-20 DIAGNOSIS — R002 Palpitations: Secondary | ICD-10-CM | POA: Diagnosis not present

## 2024-08-20 DIAGNOSIS — I422 Other hypertrophic cardiomyopathy: Secondary | ICD-10-CM | POA: Diagnosis not present

## 2024-08-21 LAB — BASIC METABOLIC PANEL WITH GFR
BUN/Creatinine Ratio: 17 (ref 12–28)
BUN: 13 mg/dL (ref 8–27)
CO2: 20 mmol/L (ref 20–29)
Calcium: 9.2 mg/dL (ref 8.7–10.3)
Chloride: 106 mmol/L (ref 96–106)
Creatinine, Ser: 0.75 mg/dL (ref 0.57–1.00)
Glucose: 161 mg/dL — ABNORMAL HIGH (ref 70–99)
Potassium: 4.5 mmol/L (ref 3.5–5.2)
Sodium: 140 mmol/L (ref 134–144)
eGFR: 85 mL/min/1.73 (ref >59)

## 2024-08-21 LAB — CBC
Hematocrit: 48 % — ABNORMAL HIGH (ref 34.0–46.6)
Hemoglobin: 16 g/dL — ABNORMAL HIGH (ref 11.1–15.9)
MCH: 32.1 pg (ref 26.6–33.0)
MCHC: 33.3 g/dL (ref 31.5–35.7)
MCV: 96 fL (ref 79–97)
Platelets: 278 x10E3/uL (ref 150–450)
RBC: 4.99 x10E6/uL (ref 3.77–5.28)
RDW: 12.9 % (ref 11.7–15.4)
WBC: 8.4 x10E3/uL (ref 3.4–10.8)

## 2024-08-26 DIAGNOSIS — G4733 Obstructive sleep apnea (adult) (pediatric): Secondary | ICD-10-CM | POA: Diagnosis not present

## 2024-08-26 DIAGNOSIS — R400 Somnolence: Secondary | ICD-10-CM | POA: Diagnosis not present

## 2024-08-27 ENCOUNTER — Other Ambulatory Visit: Payer: Self-pay | Admitting: Internal Medicine

## 2024-08-27 ENCOUNTER — Other Ambulatory Visit: Payer: Self-pay | Admitting: Nurse Practitioner

## 2024-08-27 DIAGNOSIS — E1140 Type 2 diabetes mellitus with diabetic neuropathy, unspecified: Secondary | ICD-10-CM

## 2024-09-01 NOTE — Progress Notes (Signed)
 Pt called for pre procedure instructions. Arrival time 0700 NPO after midnight explained Instructed to take am meds with sip of water and confirmed blood thinner consistency Instructed pt need for ride home tomorrow and have responsible adult with them for 24 hrs post procedure.

## 2024-09-02 ENCOUNTER — Ambulatory Visit (HOSPITAL_COMMUNITY): Admitting: Anesthesiology

## 2024-09-02 ENCOUNTER — Ambulatory Visit (HOSPITAL_COMMUNITY)
Admission: RE | Admit: 2024-09-02 | Discharge: 2024-09-02 | Disposition: A | Discharge status: Home / Self Care | Attending: Internal Medicine | Admitting: Internal Medicine

## 2024-09-02 ENCOUNTER — Other Ambulatory Visit: Payer: Self-pay

## 2024-09-02 ENCOUNTER — Ambulatory Visit (HOSPITAL_COMMUNITY)
Admission: RE | Admit: 2024-09-02 | Discharge: 2024-09-02 | Disposition: A | Source: Ambulatory Visit | Attending: Internal Medicine | Admitting: Internal Medicine

## 2024-09-02 ENCOUNTER — Encounter (HOSPITAL_COMMUNITY): Payer: Self-pay | Admitting: Internal Medicine

## 2024-09-02 ENCOUNTER — Encounter (HOSPITAL_COMMUNITY): Admission: RE | Disposition: A | Payer: Self-pay | Attending: Internal Medicine

## 2024-09-02 DIAGNOSIS — E1151 Type 2 diabetes mellitus with diabetic peripheral angiopathy without gangrene: Secondary | ICD-10-CM | POA: Insufficient documentation

## 2024-09-02 DIAGNOSIS — I080 Rheumatic disorders of both mitral and aortic valves: Secondary | ICD-10-CM | POA: Insufficient documentation

## 2024-09-02 DIAGNOSIS — I509 Heart failure, unspecified: Secondary | ICD-10-CM

## 2024-09-02 DIAGNOSIS — Z7984 Long term (current) use of oral hypoglycemic drugs: Secondary | ICD-10-CM | POA: Insufficient documentation

## 2024-09-02 DIAGNOSIS — I340 Nonrheumatic mitral (valve) insufficiency: Secondary | ICD-10-CM

## 2024-09-02 DIAGNOSIS — K219 Gastro-esophageal reflux disease without esophagitis: Secondary | ICD-10-CM | POA: Insufficient documentation

## 2024-09-02 DIAGNOSIS — Z87891 Personal history of nicotine dependence: Secondary | ICD-10-CM | POA: Insufficient documentation

## 2024-09-02 DIAGNOSIS — Z79899 Other long term (current) drug therapy: Secondary | ICD-10-CM | POA: Insufficient documentation

## 2024-09-02 DIAGNOSIS — G4733 Obstructive sleep apnea (adult) (pediatric): Secondary | ICD-10-CM | POA: Diagnosis not present

## 2024-09-02 DIAGNOSIS — I421 Obstructive hypertrophic cardiomyopathy: Secondary | ICD-10-CM | POA: Insufficient documentation

## 2024-09-02 DIAGNOSIS — Q2381 Bicuspid aortic valve: Secondary | ICD-10-CM | POA: Diagnosis not present

## 2024-09-02 DIAGNOSIS — I110 Hypertensive heart disease with heart failure: Secondary | ICD-10-CM

## 2024-09-02 HISTORY — PX: TRANSESOPHAGEAL ECHOCARDIOGRAM (CATH LAB): EP1270

## 2024-09-02 LAB — ECHO TEE
AV Mean grad: 145 mmHg
AV Peak grad: 249 mmHg
Ao pk vel: 7.89 m/s

## 2024-09-02 SURGERY — TRANSESOPHAGEAL ECHOCARDIOGRAM (TEE) (CATHLAB)
Anesthesia: Monitor Anesthesia Care

## 2024-09-02 MED ORDER — IPRATROPIUM-ALBUTEROL 0.5-2.5 (3) MG/3ML IN SOLN: 3.0000 mL | Freq: Once | RESPIRATORY_TRACT | Status: AC

## 2024-09-02 MED ORDER — SODIUM CHLORIDE 0.9 % IV SOLN: INTRAVENOUS | Status: DC

## 2024-09-02 MED ORDER — PHENYLEPHRINE HCL (PRESSORS) 10 MG/ML IV SOLN
INTRAVENOUS | Status: DC | PRN
  Administered 2024-09-02 (×2): 160 ug via INTRAVENOUS
  Administered 2024-09-02: 09:00:00 80 ug via INTRAVENOUS

## 2024-09-02 MED ORDER — IPRATROPIUM-ALBUTEROL 0.5-2.5 (3) MG/3ML IN SOLN
RESPIRATORY_TRACT | Status: AC
  Administered 2024-09-02: 08:00:00 3 mL via RESPIRATORY_TRACT
  Filled 2024-09-02: qty 3

## 2024-09-02 MED ORDER — PHENYLEPHRINE HCL-NACL 20-0.9 MG/250ML-% IV SOLN
INTRAVENOUS | Status: DC | PRN
  Administered 2024-09-02: 09:00:00 20 ug/min via INTRAVENOUS

## 2024-09-02 MED ORDER — PROPOFOL 500 MG/50ML IV EMUL
INTRAVENOUS | Status: DC | PRN
  Administered 2024-09-02: 09:00:00 150 ug/kg/min via INTRAVENOUS

## 2024-09-02 NOTE — Anesthesia Postprocedure Evaluation (Signed)
 Anesthesia Post Note  Patient: Monique Warren  Procedure(s) Performed: TRANSESOPHAGEAL ECHOCARDIOGRAM     Patient location during evaluation: Cath Lab Anesthesia Type: MAC Level of consciousness: awake and alert Pain management: pain level controlled Vital Signs Assessment: post-procedure vital signs reviewed and stable Respiratory status: spontaneous breathing, nonlabored ventilation, respiratory function stable and patient connected to nasal cannula oxygen Cardiovascular status: stable and blood pressure returned to baseline Postop Assessment: no apparent nausea or vomiting Anesthetic complications: no   There were no known notable events for this encounter.  Last Vitals:  Vitals:   09/02/24 0945 09/02/24 0950  BP: (!) 118/59 129/84  Pulse: 96 97  Resp: 17 (!) 24  Temp:    SpO2: 95% 96%    Last Pain:  Vitals:   09/02/24 0732  TempSrc:   PainSc: 5                  [name]

## 2024-09-02 NOTE — H&P (Signed)
 Cardiology History and Physical    Patient ID: Monique Warren; MRN: [id number]; DOB: January 11, 1951   Admission date: 09/02/2024  Primary Care Provider: Tobie Suzzane POUR, MD Primary Cardiologist: Dr. Okey  Chief Complaint:  Shortness of Breath  History of Present Illness:   Monique Warren is a 73 year old with obstructive hypertrophic cardiomyopathy and a bicuspid aortic valve who presents with palpitations and chest heaviness.  She experiences palpitations and a sensation of chest heaviness despite being on AV nodal therapy. She also reports dizziness upon standing and has had episodes of near syncope.  Her past cardiac workup includes a cardiac CT in 2023, which showed no coronary artery disease with a zero calcium  score. An echocardiogram was performed in July 2025. She has been previously attempted on metoprolol  and dual AV nodal therapy, but these treatments have not been effective in managing her symptoms.  Her family history is significant as her children are undergoing screening for hypertrophic cardiomyopathy and bicuspid aortic valve, given her diagnosis.  Allergies:   Allergies[1]  Social History:   Social History   Socioeconomic History   Marital status: Married    Spouse name: Not on file   Number of children: Not on file   Years of education: Not on file   Highest education level: GED or equivalent  Occupational History   Not on file  Tobacco Use   Smoking status: Former    Current packs/day: 0.50    Types: Cigarettes    Passive exposure: Past   Smokeless tobacco: Never  Vaping Use   Vaping status: Never Used  Substance and Sexual Activity   Alcohol use: Not Currently    Comment: rarely   Drug use: Never   Sexual activity: Not on file  Other Topics Concern   Not on file  Social History Narrative   Not on file   Social Drivers of Health   Tobacco Use: Medium Risk (08/10/2024)   Patient History    Smoking Tobacco Use: Former    Smokeless Tobacco Use:  Never    Passive Exposure: Past  Physicist, Medical Strain: Low Risk (07/01/2024)   Overall Financial Resource Strain (CARDIA)    Difficulty of Paying Living Expenses: Not very hard  Food Insecurity: No Food Insecurity (07/01/2024)   Epic    Worried About Programme Researcher, Broadcasting/film/video in the Last Year: Never true    Ran Out of Food in the Last Year: Never true  Transportation Needs: No Transportation Needs (07/01/2024)   Epic    Lack of Transportation (Medical): No    Lack of Transportation (Non-Medical): No  Physical Activity: Inactive (07/01/2024)   Exercise Vital Sign    Days of Exercise per Week: 0 days    Minutes of Exercise per Session: Not on file  Stress: Stress Concern Present (07/01/2024)   [employer] of Occupational Health - Occupational Stress Questionnaire    Feeling of Stress: To some extent  Social Connections: Moderately Integrated (07/01/2024)   Social Connection and Isolation Panel    Frequency of Communication with Friends and Family: More than three times a week    Frequency of Social Gatherings with Friends and Family: More than three times a week    Attends Religious Services: More than 4 times per year    Active Member of [employer] or Organizations: No    Attends Banker Meetings: Not on file    Marital Status: Married  Intimate Partner Violence: Not At Risk (04/21/2024)  Epic    Fear of Current or Ex-Partner: No    Emotionally Abused: No    Physically Abused: No    Sexually Abused: No  Depression (PHQ2-9): High Risk (03/01/2024)   Depression (PHQ2-9)    PHQ-2 Score: 17  Alcohol Screen: Low Risk (07/01/2024)   Alcohol Screen    Last Alcohol Screening Score (AUDIT): 1  Housing: Low Risk (07/01/2024)   Epic    Unable to Pay for Housing in the Last Year: No    Number of Times Moved in the Last Year: 0    Homeless in the Last Year: No  Utilities: Not At Risk (04/21/2024)   Epic    Threatened with loss of utilities: No  Health Literacy: Adequate  Health Literacy (04/21/2024)   B1300 Health Literacy    Frequency of need for help with medical instructions: Never    Family History:   The patient's family history includes ADD / ADHD in her daughter; Alcohol abuse in her father; Arthritis in her mother; Breast cancer in her mother and paternal grandmother; Depression in her mother; Early death in her brother; Heart attack in her father; Heart disease in her father; Heart failure in her father; Hyperlipidemia in her father and mother; Hypertension in her father; Stroke in her mother; Thyroid disease in her mother. There is no history of Cancer - Colon, Gastric cancer, Esophageal cancer, Liver cancer, or Autoimmune disease.    ROS:  Please see the history of present illness.   Physical Exam/Data:   Vitals:   09/02/24 0719 09/02/24 0732  BP: (!) 137/100 (!) 119/104  Pulse: (!) 107 (!) 101  Resp: 16 13  Temp: (!) 97.5 F (36.4 C)   TempSrc: Temporal   SpO2: 96% 97%  Weight: 81.6 kg 81.6 kg  Height: 5' 2 (1.575 m) 5' 2 (1.575 m)   No intake or output data in the 24 hours ending 09/02/24 0816 Filed Weights   09/02/24 0719 09/02/24 0732  Weight: 81.6 kg 81.6 kg   Body mass index is 32.92 kg/m.   Gen: no distress, obese Cardiac: No Rubs or Gallops, harsh systolic Murmur, resting tachycardia Respiratory: Clear to auscultation bilaterally, normal effort, normal  respiratory rate GI: Soft, nontender, non-distended  MS: No  edema;  moves all extremities Integument: Skin feels warm Neuro:  At time of evaluation, alert and oriented to person/place/time/situation  Psych: Normal affect, patient feels fair    Relevant CV Studies:  Cardiac Studies & Procedures   ______________________________________________________________________________________________   STRESS TESTS  MYOCARDIAL PERFUSION IMAGING 04/25/2017   ECHOCARDIOGRAM  ECHOCARDIOGRAM COMPLETE 04/06/2024  Narrative ECHOCARDIOGRAM REPORT    Patient Name:   Monique Warren Date of Exam: 04/06/2024 Medical Rec #:  [id number]         Height:       62.0 in Accession #:    [id number]        Weight:       190.8 lb Date of Birth:  [birth year]          BSA:          1.874 m Patient Age:    72 years          BP:           140/88 mmHg Patient Gender: F                 HR:           80 bpm. Exam Location:  [facility]  Procedure: 2D Echo, Cardiac Doppler and Color Doppler (Both Spectral and Color Flow Doppler were utilized during procedure).  Indications:    Murmur R01.1  History:        Patient has prior history of Echocardiogram examinations, most recent 10/11/2021. Risk Factors:Hypertension, Diabetes, Dyslipidemia and Sleep Apnea. Hx of COVID-19.  Sonographer:    Aida Pizza RCS Referring Phys: [id number] PAULA V ROSS  IMPRESSIONS   1. LV is severely hypoertrophied, more prominent than on echo from 2023. Narrow LVOT. Turbulent flow through LV/LVOT WIth Valsalva maneuver, peak gradient through LV/LVOT increases to 86 mm Hg (4.63 m/sec) Overall consistent with HOCM . Left ventricular ejection fraction, by estimation, is 65 to 70%. The left ventricle has normal function. The left ventricle has no regional wall motion abnormalities. There is severe asymmetric left ventricular hypertrophy. Left ventricular diastolic parameters are consistent with Grade I diastolic dysfunction (impaired relaxation). Elevated left atrial pressure. 2. Right ventricular systolic function is normal. The right ventricular size is normal. 3. The mitral valve is normal in structure. Mild mitral valve regurgitation. 4. AV is thickened, probable trileaflet Difficult to see well Mildly restricted motion. . The aortic valve was not well visualized. Aortic valve regurgitation is not visualized. Aortic valve sclerosis/calcification is present, without any evidence of aortic stenosis. 5. The inferior vena cava is normal in size with greater than 50% respiratory variability, suggesting right atrial  pressure of 3 mmHg.  Comparison(s): The left ventricular function is unchanged.  FINDINGS Left Ventricle: LV is severely hypoertrophied, more prominent than on echo from 2023. Narrow LVOT. Turbulent flow through LV/LVOT WIth Valsalva maneuver, peak gradient through LV/LVOT increases to 86 mm Hg (4.63 m/sec) Overall consistent with HOCM. Left ventricular ejection fraction, by estimation, is 65 to 70%. The left ventricle has normal function. The left ventricle has no regional wall motion abnormalities. The left ventricular internal cavity size was small. There is severe asymmetric left ventricular hypertrophy. Left ventricular diastolic parameters are consistent with Grade I diastolic dysfunction (impaired relaxation). Elevated left atrial pressure.  Right Ventricle: The right ventricular size is normal. Right vetricular wall thickness was not assessed. Right ventricular systolic function is normal.  Left Atrium: Left atrial size was normal in size.  Right Atrium: Right atrial size was normal in size.  Pericardium: There is no evidence of pericardial effusion.  Mitral Valve: The mitral valve is normal in structure. Mild mitral valve regurgitation.  Tricuspid Valve: The tricuspid valve is grossly normal. Tricuspid valve regurgitation is trivial.  Aortic Valve: AV is thickened, probable trileaflet Difficult to see well Mildly restricted motion. The aortic valve was not well visualized. Aortic valve regurgitation is not visualized. Aortic valve sclerosis/calcification is present, without any evidence of aortic stenosis. Aortic valve mean gradient measures 30.0 mmHg. Aortic valve peak gradient measures 58.7 mmHg.  Pulmonic Valve: The pulmonic valve was not well visualized. Pulmonic valve regurgitation is not visualized. No evidence of pulmonic stenosis.  Aorta: The aortic root is normal in size and structure.  Venous: The inferior vena cava is normal in size with greater than 50% respiratory  variability, suggesting right atrial pressure of 3 mmHg.  IAS/Shunts: No atrial level shunt detected by color flow Doppler.   LEFT VENTRICLE PLAX 2D LVIDd:         3.10 cm   Diastology LVIDs:         2.30 cm   LV e' medial:    3.26 cm/s LV PW:         1.78 cm  LV E/e' medial:  24.3 LV IVS:        2.17 cm   LV e' lateral:   4.13 cm/s LVOT diam:     1.80 cm   LV E/e' lateral: 19.2 LVOT Area:     2.54 cm   RIGHT VENTRICLE RV S prime:     12.10 cm/s TAPSE (M-mode): 1.6 cm  LEFT ATRIUM             Index        RIGHT ATRIUM          Index LA diam:        3.50 cm 1.87 cm/m   RA Area:     8.61 cm LA Vol (A2C):   48.2 ml 25.72 ml/m  RA Volume:   15.80 ml 8.43 ml/m LA Vol (A4C):   44.9 ml 23.96 ml/m LA Biplane Vol: 47.8 ml 25.51 ml/m AORTIC VALVE AV Vmax:      383.00 cm/s AV Vmean:     251.000 cm/s AV VTI:       0.737 m AV Peak Grad: 58.7 mmHg AV Mean Grad: 30.0 mmHg  AORTA Ao Root diam: 3.10 cm  MITRAL VALVE MV Area (PHT): 2.27 cm     SHUNTS MV Decel Time: 335 msec     Systemic Diam: 1.80 cm MV E velocity: 79.25 cm/s MV A velocity: 119.00 cm/s MV E/A ratio:  0.67  Vina Gull MD Electronically signed by Vina Gull MD Signature Date/Time: 04/06/2024/4:02:55 PM    Final    MONITORS  LONG TERM MONITOR (3-14 DAYS) 08/17/2024  Narrative   Patient had a minimum heart rate of 55 bpm, maximum heart rate of 169 bpm, and average heart rate of 75 bpm. Predominant underlying rhythm was sinus rhythm. Rare, P-SVT, lasting 8 beats at longest. Three pauses, 4 seconds at longest. Isolated PACs were rare (<1.0%). Isolated PVCs were rare (<1.0%). Three nocturnal, asymptomatic pauses Triggered and diary events associated with sinus rhythm.  No malignant arrhythmias of HCM.  Given pauses, unable to further titrate AV nodal therapy.   CT SCANS  CT CORONARY MORPH W/CTA COR W/SCORE 12/04/2021  Addendum 12/04/2021  2:41 PM ADDENDUM REPORT: 12/04/2021 14:38  EXAM: OVER-READ  INTERPRETATION  CT CHEST  The following report is an over-read performed by radiologist Dr. Marcey Diones [facility] on 12/04/2021. This over-read does not include interpretation of cardiac or coronary anatomy or pathology. The coronary CTA interpretation by the cardiologist is attached.  COMPARISON:  None.  FINDINGS: No significant noncardiac vascular findings. Visualized mediastinum and hilar regions demonstrate no lymphadenopathy or masses. Hiatal hernia contains fat and some of the proximal stomach. Visualized lungs show no evidence of pulmonary edema, consolidation, pneumothorax, nodule or pleural fluid. Visualized liver demonstrates evidence of steatosis. Visualized bony structures are unremarkable.  IMPRESSION: 1. Hiatal hernia. 2. Hepatic steatosis.   Electronically Signed By: Marcey Moan M.D. On: 12/04/2021 14:38  Narrative CLINICAL DATA:  Chest pain  EXAM: Cardiac CTA  MEDICATIONS: Sub lingual nitro. 4mg  and lopressor  100mg  Cardizem  120 mg  TECHNIQUE: The patient was scanned on a Siemens Force 192 slice scanner. Gantry rotation speed was 250 msecs. Collimation was .6 mm. A 100 kV prospective scan was triggered in the ascending thoracic aorta at 140 HU's Full mA was used between 35% and 75% of the R-R interval. Average HR during the scan was 56 bpm. The 3D data set was interpreted on a dedicated work station using MPR, MIP and VRT modes. A  total of 80 cc of contrast was used.  FINDINGS: Non-cardiac: See separate report from [facility]. No significant findings on limited lung and soft tissue windows.  Calcium  Score: No calcium  noted  Coronary Arteries: Right dominant with no anomalies  LM: Normal side by side LCX/LAD ostia  IM: Small vessel normal  LAD: Normal  D1: Normal  D2: Normal  D3: Normal  Circumflex: Normal  OM1: Normal  OM2: Normal  RCA: Normal  PDA: Normal  PLA: Normal  IMPRESSION: 1. Calcium   score 0  2.  CAD RADS 0 normal right dominant coronary arteries  3.  Normal ascending thoracic aorta 3.3 cm  Maude Emmer  Electronically Signed: By: Maude Emmer M.D. On: 12/04/2021 14:08   CARDIAC MRI  MR CARDIAC MORPHOLOGY W WO CONTRAST 06/16/2024  Narrative CLINICAL DATA:  Hypertrophic Cardiomyopathy  EXAM: CARDIAC MRI  TECHNIQUE: The patient was scanned on a 1.5 Tesla Siemens magnet. A dedicated cardiac coil was used. Functional imaging was done using Fiesta sequences. 2,3, and 4 chamber views were done to assess for RWMA's. Modified Simpson's rule using a short axis stack was used to calculate an ejection fraction on a dedicated work [employer]. The patient received 10 cc of Gadavist . After 10 minutes inversion recovery sequences were used to assess for infiltration and scar tissue.  CONTRAST:  Gadavist   FINDINGS: Moderate LAE. Normal RA. No PFO/ASD. Lipomatous hypertrophy of the atrial septum. Normal RV size and function. Normal TV/PV. Sievers type 0 bicuspid AV. AVA by planimetry 1.8 cm2 consistent with mild aortic valvular stenosis. No significant AR. Normal ascending thoracic aorta 3.4 cm. Mitral valve is thickened with SAM Mild appearing MR with regurgitant fraction 13% consistent with mild MR. There is significant turbulence in the LVOT consistent with sub valvular gradient. Suggest echo correlation. Severe septal hypertrophy 22 mm compared to posterior wall 11 mm. Findings consistent with hypertrophic cardiomyopathy  Quantitative LVEF: 61% (EDV 105 cc, ESV 41 cc SV 64 cc) Estimated cardiac output using flow analysis 2.3 L/min Flow analysis curve shows bifid waveform consistent with LVOT gradient  Quantitative RVEF: 67% (EDV 88 cc ESV 29 cc SV 59 cc )  Delayed enhancement images with gadolinium show no infarct, infiltration, or scarring. Normal myocardial nulling Not consistent with amyloid  Parametric Measures: Using Hct of 44  non current  T1: Mildly elevated 1031 msec  ZRC:Fpoiob elevated 34%  T2: Normal 48 msec  IMPRESSION: 1. Severe septal hypertrophy 22 mm with SAM and LVOT gradient. Findings consistent with hypertrophic cardiomyopathy  2. Since MRI done 01/14/22 myocardial mass has increased from 55g/m2 to 72g/m2  3. Despite severe thickening delayed enhancement with gadolinium shows no uptake  4. Images not suggestive of amyloid with normal nulling of the myocardium  5.  Mild MR with SAM Regurgitant fraction 13%  6. Sievers type 0 bicuspid AV with no AR and mild AS AVA by planimetry 1.8 cm2  7. Mildly elevated T1/ECV consistent with hypertrophic cardiomyopathy Normal T2 suggesting no myocarditis  8. Decreased cardiac output 2.3 L/min with bifid waveform also consistent with outflow tract gradient Suggest echo correlation  9.  Moderate LAE  10.  Lipomatous hypertrophy of atrial septum  11.  Normal RV size and function RVEF 67%  Maude Emmer   Electronically Signed By: Maude Emmer M.D. On: 06/16/2024 17:07   ______________________________________________________________________________________________       Assessment and Plan:    Mitral Regurgitation After careful review of history and examination, the risks and benefits  of transesophageal echocardiogram have been explained including risks of esophageal damage, perforation (1:10,000 risk), bleeding, pharyngeal hematoma as well as other potential complications associated with conscious sedation including aspiration, arrhythmia, respiratory failure and death. Alternatives to treatment were discussed, questions were answered. Patient is willing to proceed. Discussed one missing tooth, no other loose teeth.  Obstructive hypertrophic cardiomyopathy with bicuspid aortic valve Obstructive hypertrophic cardiomyopathy with associated bicuspid aortic valve. Symptoms include palpitations, chest heaviness, dizziness, and near syncope.  Previous attempts at medical management with metoprolol  and dual AV nodal therapy were unsuccessful. Cardiac myosin inhibitors were not trialed due to lack of efficacy of previous medications. Echocardiogram showed a peak gradient of 86 mmHg. No significant aortic stenosis or regurgitation. Previous cardiac CT showed no coronary artery disease with a zero calcium  score. Surgical intervention is being considered due to the failure of medical management. - Performed echocardiogram to assess valve morphology and HCM. - Will refer to [facility] for consideration of septal myectomy, mitral valve repair, or papillary muscle plication. - Will discuss surgical options with a surgeon at [facility], including potential need for additional testing if recommended by the surgeon.  Preprocedural evaluation for septal myectomy Preprocedural evaluation for septal myectomy due to obstructive hypertrophic cardiomyopathy. The procedure is complex and requires careful planning. She prefers referral to the best center rather than the closest. The procedure is not expected to be performed in 2025, with anticipation of surgery in 2026. The goal is to ensure the best possible outcome by referring to a center of excellence.  Will Call Miquel post TEE and discuss with patient Screening is still pending   For questions or updates, please contact [facility] Please consult [email or web address] for contact info under Cardiology/STEMI.   Stanly Leavens, MD FASE [facility] Cardiologist [facility]  [address] [place], [address] [place], [place] [address] [phone]  8:16 AM      [1]  Allergies Allergen Reactions   Cefuroxime Anaphylaxis, Hives, Shortness Of Breath, Swelling and Anxiety   Codeine Hives, Shortness Of Breath, Nausea And Vomiting, Swelling and Anxiety   Morphine  Anaphylaxis   Sulfa Antibiotics Anaphylaxis, Hives, Nausea And Vomiting and Swelling    Swollen throat   Sulfamethoxazole-Trimethoprim Shortness Of  Breath, Swelling and Anxiety    Throat swelling    Gluten Meal     Severe reflux - celiac disease

## 2024-09-02 NOTE — H&P (Addendum)
 Cardiology History and Physical    Patient ID: Monique Warren; MRN: [id number]; DOB: [birth year]   Admission date: 09/02/2024  Primary Care Provider: Tobie Suzzane POUR, MD Primary Cardiologist: Dr. Okey  Chief Complaint:  Shortness of Breath  History of Present Illness:   Monique Warren is a 73 year old with obstructive hypertrophic cardiomyopathy and a bicuspid aortic valve who presents with palpitations and chest heaviness.  She experiences palpitations and a sensation of chest heaviness despite being on AV nodal therapy. She also reports dizziness upon standing and has had episodes of near syncope.  Her past cardiac workup includes a cardiac CT in 2023, which showed no coronary artery disease with a zero calcium  score. An echocardiogram was performed in July 2025. She has been previously attempted on metoprolol  and dual AV nodal therapy, but these treatments have not been effective in managing her symptoms.  Her family history is significant as her children are undergoing screening for hypertrophic cardiomyopathy and bicuspid aortic valve, given her diagnosis.  Allergies:   Allergies[1]  Social History:   Social History   Socioeconomic History   Marital status: Married    Spouse name: Not on file   Number of children: Not on file   Years of education: Not on file   Highest education level: GED or equivalent  Occupational History   Not on file  Tobacco Use   Smoking status: Former    Current packs/day: 0.50    Types: Cigarettes    Passive exposure: Past   Smokeless tobacco: Never  Vaping Use   Vaping status: Never Used  Substance and Sexual Activity   Alcohol use: Not Currently    Comment: rarely   Drug use: Never   Sexual activity: Not on file  Other Topics Concern   Not on file  Social History Narrative   Not on file   Social Drivers of Health   Tobacco Use: Medium Risk (08/10/2024)   Patient History    Smoking Tobacco Use: Former    Smokeless Tobacco Use:  Never    Passive Exposure: Past  Physicist, Medical Strain: Low Risk (07/01/2024)   Overall Financial Resource Strain (CARDIA)    Difficulty of Paying Living Expenses: Not very hard  Food Insecurity: No Food Insecurity (07/01/2024)   Epic    Worried About Programme Researcher, Broadcasting/film/video in the Last Year: Never true    Ran Out of Food in the Last Year: Never true  Transportation Needs: No Transportation Needs (07/01/2024)   Epic    Lack of Transportation (Medical): No    Lack of Transportation (Non-Medical): No  Physical Activity: Inactive (07/01/2024)   Exercise Vital Sign    Days of Exercise per Week: 0 days    Minutes of Exercise per Session: Not on file  Stress: Stress Concern Present (07/01/2024)   [employer] of Occupational Health - Occupational Stress Questionnaire    Feeling of Stress: To some extent  Social Connections: Moderately Integrated (07/01/2024)   Social Connection and Isolation Panel    Frequency of Communication with Friends and Family: More than three times a week    Frequency of Social Gatherings with Friends and Family: More than three times a week    Attends Religious Services: More than 4 times per year    Active Member of [employer] or Organizations: No    Attends Banker Meetings: Not on file    Marital Status: Married  Intimate Partner Violence: Not At Risk (04/21/2024)  Epic    Fear of Current or Ex-Partner: No    Emotionally Abused: No    Physically Abused: No    Sexually Abused: No  Depression (PHQ2-9): High Risk (03/01/2024)   Depression (PHQ2-9)    PHQ-2 Score: 17  Alcohol Screen: Low Risk (07/01/2024)   Alcohol Screen    Last Alcohol Screening Score (AUDIT): 1  Housing: Low Risk (07/01/2024)   Epic    Unable to Pay for Housing in the Last Year: No    Number of Times Moved in the Last Year: 0    Homeless in the Last Year: No  Utilities: Not At Risk (04/21/2024)   Epic    Threatened with loss of utilities: No  Health Literacy: Adequate  Health Literacy (04/21/2024)   B1300 Health Literacy    Frequency of need for help with medical instructions: Never    Family History:   The patient's family history includes ADD / ADHD in her daughter; Alcohol abuse in her father; Arthritis in her mother; Breast cancer in her mother and paternal grandmother; Depression in her mother; Early death in her brother; Heart attack in her father; Heart disease in her father; Heart failure in her father; Hyperlipidemia in her father and mother; Hypertension in her father; Stroke in her mother; Thyroid disease in her mother. There is no history of Cancer - Colon, Gastric cancer, Esophageal cancer, Liver cancer, or Autoimmune disease.    ROS:  Please see the history of present illness.   Physical Exam/Data:   Vitals:   09/02/24 0719 09/02/24 0732  BP: (!) 137/100 (!) 119/104  Pulse: (!) 107 (!) 101  Resp: 16 13  Temp: (!) 97.5 F (36.4 C)   TempSrc: Temporal   SpO2: 96% 97%  Weight: 81.6 kg 81.6 kg  Height: 5' 2 (1.575 m) 5' 2 (1.575 m)   No intake or output data in the 24 hours ending 09/02/24 0818 Filed Weights   09/02/24 0719 09/02/24 0732  Weight: 81.6 kg 81.6 kg   Body mass index is 32.92 kg/m.   Gen: no distress, obese Cardiac: No Rubs or Gallops, harsh systolic Murmur, resting tachycardia Respiratory: Clear to auscultation bilaterally, normal effort, normal  respiratory rate GI: Soft, nontender, non-distended  MS: No  edema;  moves all extremities Integument: Skin feels warm Neuro:  At time of evaluation, alert and oriented to person/place/time/situation  Psych: Normal affect, patient feels fair    Relevant CV Studies:  Cardiac Studies & Procedures   ______________________________________________________________________________________________   STRESS TESTS  MYOCARDIAL PERFUSION IMAGING 04/25/2017   ECHOCARDIOGRAM  ECHOCARDIOGRAM COMPLETE 04/06/2024  Narrative ECHOCARDIOGRAM REPORT    Patient Name:   Monique Warren Date of Exam: 04/06/2024 Medical Rec #:  [id number]         Height:       62.0 in Accession #:    [id number]        Weight:       190.8 lb Date of Birth:  29-Apr-1951          BSA:          1.874 m Patient Age:    72 years          BP:           140/88 mmHg Patient Gender: F                 HR:           80 bpm. Exam Location:  [facility]  Procedure: 2D Echo, Cardiac Doppler and Color Doppler (Both Spectral and Color Flow Doppler were utilized during procedure).  Indications:    Murmur R01.1  History:        Patient has prior history of Echocardiogram examinations, most recent 10/11/2021. Risk Factors:Hypertension, Diabetes, Dyslipidemia and Sleep Apnea. Hx of COVID-19.  Sonographer:    Aida Pizza RCS Referring Phys: [id number] PAULA V ROSS  IMPRESSIONS   1. LV is severely hypoertrophied, more prominent than on echo from 2023. Narrow LVOT. Turbulent flow through LV/LVOT WIth Valsalva maneuver, peak gradient through LV/LVOT increases to 86 mm Hg (4.63 m/sec) Overall consistent with HOCM . Left ventricular ejection fraction, by estimation, is 65 to 70%. The left ventricle has normal function. The left ventricle has no regional wall motion abnormalities. There is severe asymmetric left ventricular hypertrophy. Left ventricular diastolic parameters are consistent with Grade I diastolic dysfunction (impaired relaxation). Elevated left atrial pressure. 2. Right ventricular systolic function is normal. The right ventricular size is normal. 3. The mitral valve is normal in structure. Mild mitral valve regurgitation. 4. AV is thickened, probable trileaflet Difficult to see well Mildly restricted motion. . The aortic valve was not well visualized. Aortic valve regurgitation is not visualized. Aortic valve sclerosis/calcification is present, without any evidence of aortic stenosis. 5. The inferior vena cava is normal in size with greater than 50% respiratory variability, suggesting right atrial  pressure of 3 mmHg.  Comparison(s): The left ventricular function is unchanged.  FINDINGS Left Ventricle: LV is severely hypoertrophied, more prominent than on echo from 2023. Narrow LVOT. Turbulent flow through LV/LVOT WIth Valsalva maneuver, peak gradient through LV/LVOT increases to 86 mm Hg (4.63 m/sec) Overall consistent with HOCM. Left ventricular ejection fraction, by estimation, is 65 to 70%. The left ventricle has normal function. The left ventricle has no regional wall motion abnormalities. The left ventricular internal cavity size was small. There is severe asymmetric left ventricular hypertrophy. Left ventricular diastolic parameters are consistent with Grade I diastolic dysfunction (impaired relaxation). Elevated left atrial pressure.  Right Ventricle: The right ventricular size is normal. Right vetricular wall thickness was not assessed. Right ventricular systolic function is normal.  Left Atrium: Left atrial size was normal in size.  Right Atrium: Right atrial size was normal in size.  Pericardium: There is no evidence of pericardial effusion.  Mitral Valve: The mitral valve is normal in structure. Mild mitral valve regurgitation.  Tricuspid Valve: The tricuspid valve is grossly normal. Tricuspid valve regurgitation is trivial.  Aortic Valve: AV is thickened, probable trileaflet Difficult to see well Mildly restricted motion. The aortic valve was not well visualized. Aortic valve regurgitation is not visualized. Aortic valve sclerosis/calcification is present, without any evidence of aortic stenosis. Aortic valve mean gradient measures 30.0 mmHg. Aortic valve peak gradient measures 58.7 mmHg.  Pulmonic Valve: The pulmonic valve was not well visualized. Pulmonic valve regurgitation is not visualized. No evidence of pulmonic stenosis.  Aorta: The aortic root is normal in size and structure.  Venous: The inferior vena cava is normal in size with greater than 50% respiratory  variability, suggesting right atrial pressure of 3 mmHg.  IAS/Shunts: No atrial level shunt detected by color flow Doppler.   LEFT VENTRICLE PLAX 2D LVIDd:         3.10 cm   Diastology LVIDs:         2.30 cm   LV e' medial:    3.26 cm/s LV PW:         1.78 cm  LV E/e' medial:  24.3 LV IVS:        2.17 cm   LV e' lateral:   4.13 cm/s LVOT diam:     1.80 cm   LV E/e' lateral: 19.2 LVOT Area:     2.54 cm   RIGHT VENTRICLE RV S prime:     12.10 cm/s TAPSE (M-mode): 1.6 cm  LEFT ATRIUM             Index        RIGHT ATRIUM          Index LA diam:        3.50 cm 1.87 cm/m   RA Area:     8.61 cm LA Vol (A2C):   48.2 ml 25.72 ml/m  RA Volume:   15.80 ml 8.43 ml/m LA Vol (A4C):   44.9 ml 23.96 ml/m LA Biplane Vol: 47.8 ml 25.51 ml/m AORTIC VALVE AV Vmax:      383.00 cm/s AV Vmean:     251.000 cm/s AV VTI:       0.737 m AV Peak Grad: 58.7 mmHg AV Mean Grad: 30.0 mmHg  AORTA Ao Root diam: 3.10 cm  MITRAL VALVE MV Area (PHT): 2.27 cm     SHUNTS MV Decel Time: 335 msec     Systemic Diam: 1.80 cm MV E velocity: 79.25 cm/s MV A velocity: 119.00 cm/s MV E/A ratio:  0.67  Vina Gull MD Electronically signed by Vina Gull MD Signature Date/Time: 04/06/2024/4:02:55 PM    Final    MONITORS  LONG TERM MONITOR (3-14 DAYS) 08/17/2024  Narrative   Patient had a minimum heart rate of 55 bpm, maximum heart rate of 169 bpm, and average heart rate of 75 bpm. Predominant underlying rhythm was sinus rhythm. Rare, P-SVT, lasting 8 beats at longest. Three pauses, 4 seconds at longest. Isolated PACs were rare (<1.0%). Isolated PVCs were rare (<1.0%). Three nocturnal, asymptomatic pauses Triggered and diary events associated with sinus rhythm.  No malignant arrhythmias of HCM.  Given pauses, unable to further titrate AV nodal therapy.   CT SCANS  CT CORONARY MORPH W/CTA COR W/SCORE 12/04/2021  Addendum 12/04/2021  2:41 PM ADDENDUM REPORT: 12/04/2021 14:38  EXAM: OVER-READ  INTERPRETATION  CT CHEST  The following report is an over-read performed by radiologist Dr. Marcey Diones [facility] on 12/04/2021. This over-read does not include interpretation of cardiac or coronary anatomy or pathology. The coronary CTA interpretation by the cardiologist is attached.  COMPARISON:  None.  FINDINGS: No significant noncardiac vascular findings. Visualized mediastinum and hilar regions demonstrate no lymphadenopathy or masses. Hiatal hernia contains fat and some of the proximal stomach. Visualized lungs show no evidence of pulmonary edema, consolidation, pneumothorax, nodule or pleural fluid. Visualized liver demonstrates evidence of steatosis. Visualized bony structures are unremarkable.  IMPRESSION: 1. Hiatal hernia. 2. Hepatic steatosis.   Electronically Signed By: Marcey Moan M.D. On: 12/04/2021 14:38  Narrative CLINICAL DATA:  Chest pain  EXAM: Cardiac CTA  MEDICATIONS: Sub lingual nitro. 4mg  and lopressor  100mg  Cardizem  120 mg  TECHNIQUE: The patient was scanned on a Siemens Force 192 slice scanner. Gantry rotation speed was 250 msecs. Collimation was .6 mm. A 100 kV prospective scan was triggered in the ascending thoracic aorta at 140 HU's Full mA was used between 35% and 75% of the R-R interval. Average HR during the scan was 56 bpm. The 3D data set was interpreted on a dedicated work station using MPR, MIP and VRT modes. A  total of 80 cc of contrast was used.  FINDINGS: Non-cardiac: See separate report from [facility]. No significant findings on limited lung and soft tissue windows.  Calcium  Score: No calcium  noted  Coronary Arteries: Right dominant with no anomalies  LM: Normal side by side LCX/LAD ostia  IM: Small vessel normal  LAD: Normal  D1: Normal  D2: Normal  D3: Normal  Circumflex: Normal  OM1: Normal  OM2: Normal  RCA: Normal  PDA: Normal  PLA: Normal  IMPRESSION: 1. Calcium   score 0  2.  CAD RADS 0 normal right dominant coronary arteries  3.  Normal ascending thoracic aorta 3.3 cm  Maude Emmer  Electronically Signed: By: Maude Emmer M.D. On: 12/04/2021 14:08   CARDIAC MRI  MR CARDIAC MORPHOLOGY W WO CONTRAST 06/16/2024  Narrative CLINICAL DATA:  Hypertrophic Cardiomyopathy  EXAM: CARDIAC MRI  TECHNIQUE: The patient was scanned on a 1.5 Tesla Siemens magnet. A dedicated cardiac coil was used. Functional imaging was done using Fiesta sequences. 2,3, and 4 chamber views were done to assess for RWMA's. Modified Simpson's rule using a short axis stack was used to calculate an ejection fraction on a dedicated work [employer]. The patient received 10 cc of Gadavist . After 10 minutes inversion recovery sequences were used to assess for infiltration and scar tissue.  CONTRAST:  Gadavist   FINDINGS: Moderate LAE. Normal RA. No PFO/ASD. Lipomatous hypertrophy of the atrial septum. Normal RV size and function. Normal TV/PV. Sievers type 0 bicuspid AV. AVA by planimetry 1.8 cm2 consistent with mild aortic valvular stenosis. No significant AR. Normal ascending thoracic aorta 3.4 cm. Mitral valve is thickened with SAM Mild appearing MR with regurgitant fraction 13% consistent with mild MR. There is significant turbulence in the LVOT consistent with sub valvular gradient. Suggest echo correlation. Severe septal hypertrophy 22 mm compared to posterior wall 11 mm. Findings consistent with hypertrophic cardiomyopathy  Quantitative LVEF: 61% (EDV 105 cc, ESV 41 cc SV 64 cc) Estimated cardiac output using flow analysis 2.3 L/min Flow analysis curve shows bifid waveform consistent with LVOT gradient  Quantitative RVEF: 67% (EDV 88 cc ESV 29 cc SV 59 cc )  Delayed enhancement images with gadolinium show no infarct, infiltration, or scarring. Normal myocardial nulling Not consistent with amyloid  Parametric Measures: Using Hct of 44  non current  T1: Mildly elevated 1031 msec  ZRC:Fpoiob elevated 34%  T2: Normal 48 msec  IMPRESSION: 1. Severe septal hypertrophy 22 mm with SAM and LVOT gradient. Findings consistent with hypertrophic cardiomyopathy  2. Since MRI done 01/14/22 myocardial mass has increased from 55g/m2 to 72g/m2  3. Despite severe thickening delayed enhancement with gadolinium shows no uptake  4. Images not suggestive of amyloid with normal nulling of the myocardium  5.  Mild MR with SAM Regurgitant fraction 13%  6. Sievers type 0 bicuspid AV with no AR and mild AS AVA by planimetry 1.8 cm2  7. Mildly elevated T1/ECV consistent with hypertrophic cardiomyopathy Normal T2 suggesting no myocarditis  8. Decreased cardiac output 2.3 L/min with bifid waveform also consistent with outflow tract gradient Suggest echo correlation  9.  Moderate LAE  10.  Lipomatous hypertrophy of atrial septum  11.  Normal RV size and function RVEF 67%  Maude Emmer   Electronically Signed By: Maude Emmer M.D. On: 06/16/2024 17:07   ______________________________________________________________________________________________       Assessment and Plan:    Mitral Regurgitation After careful review of history and examination, the risks and benefits  of transesophageal echocardiogram have been explained including risks of esophageal damage, perforation (1:10,000 risk), bleeding, pharyngeal hematoma as well as other potential complications associated with conscious sedation including aspiration, arrhythmia, respiratory failure and death. Alternatives to treatment were discussed, questions were answered. Patient is willing to proceed. Discussed one missing tooth, no other loose teeth.  Obstructive hypertrophic cardiomyopathy with bicuspid aortic valve Obstructive hypertrophic cardiomyopathy with associated bicuspid aortic valve. Symptoms include palpitations, chest heaviness, dizziness, and near syncope.  Previous attempts at medical management with metoprolol  and dual AV nodal therapy were unsuccessful. Cardiac myosin inhibitors were not trialed due to lack of efficacy of previous medications. Echocardiogram showed a peak gradient of 86 mmHg. No significant aortic stenosis or regurgitation. Previous cardiac CT showed no coronary artery disease with a zero calcium  score. Surgical intervention is being considered due to the failure of medical management. - Performed echocardiogram to assess valve morphology and HCM. - Will refer to [facility] for consideration of septal myectomy, mitral valve repair, or papillary muscle plication. - Will discuss surgical options with a surgeon at [facility], including potential need for additional testing if recommended by the surgeon.  Preprocedural evaluation for septal myectomy Preprocedural evaluation for septal myectomy due to obstructive hypertrophic cardiomyopathy. The procedure is complex and requires careful planning. She prefers referral to the best center rather than the closest. The procedure is not expected to be performed in 2025, with anticipation of surgery in 2026. The goal is to ensure the best possible outcome by referring to a center of excellence.  Will Call Miquel post TEE and discuss with patient Screening is still pending for her children.   NYHA Class II No AF or NSVT   For questions or updates, please contact [facility] Please consult [email or web address] for contact info under Cardiology/STEMI.   Stanly Leavens, MD FASE [facility] Cardiologist [facility]  [address] [place], [address] [place], [place] [address] [phone]  8:18 AM  Care reviewed with Patient and husband Miquel. Severe obstructive HCM with a desire to not engage in additional medical therapy and NYHA III symptoms.  Discussed [facility] options near her place of living.  Will proceed with referral to [facility] for Myectomy and consideration or MVR.  Stanly Leavens, MD FASE  [facility] Cardiologist [facility]  [address] [place], [place] [address] [phone]  10:20 AM       [1]  Allergies Allergen Reactions   Cefuroxime Anaphylaxis, Hives, Shortness Of Breath, Swelling and Anxiety   Codeine Hives, Shortness Of Breath, Nausea And Vomiting, Swelling and Anxiety   Morphine  Anaphylaxis   Sulfa Antibiotics Anaphylaxis, Hives, Nausea And Vomiting and Swelling    Swollen throat   Sulfamethoxazole-Trimethoprim Shortness Of Breath, Swelling and Anxiety    Throat swelling    Gluten Meal     Severe reflux - celiac disease

## 2024-09-02 NOTE — Discharge Instructions (Signed)
 Transesophageal Echocardiogram  Transesophageal echocardiogram, or TEE, is a test that uses sound waves to make pictures of your heart. TEE is done using a small ultrasound probe. The probe is passed down your esophagus, which is the part of your body that moves food from your mouth to your stomach. Because your heart is near your esophagus, the TEE will give clear pictures of your heart. Your health care provider can use a TEE: To see how different parts of your heart are working. To check for problems with your heart, such as infection, blood clots, or growths. You may feel the probe in your throat, but the test usually doesn't cause pain or affect your breathing. Tell a health care provider about: Any allergies you have. All medicines you are taking. These include vitamins, herbs, eye drops, creams, and over-the-counter medicines. Any problems you or family members have had with anesthesia. Any bleeding problems you have. Any surgeries you have had. Any medical conditions you have. Any trouble with swallowing. Whether you have or have had a blockage of the esophagus. Whether you're pregnant or may be pregnant. What are the risks? Your provider will talk with you about risks. These may include: Damage to nearby structures or organs. A tear of the esophagus. Fast or uneven heartbeats. A hoarse voice or trouble swallowing. Bleeding. What happens before the procedure? Medicines Ask about changing or stopping: Any medicines you take. Any vitamins, herbs, or supplements you take. Do not take aspirin or ibuprofen unless you're told to. General instructions Follow instructions about what you may eat and drink. You will need to take out any dentures or dental retainers. Ask if you'll be staying overnight in the hospital. If you'll be going home right after the test, plan to have a responsible adult: Drive you home from the hospital or clinic. You won't be allowed to drive. Stay with you  for the time you are told. What happens during the procedure?  An IV will be put into a vein in your hand or arm. You will be given: A sedative. This helps you relax. Anesthesia. This keeps you from feeling pain. It will be sprayed, or you'll gargle it, to numb the back of your throat. You may be asked to lie on your left side. A bite block will be put in your mouth. This keeps you from biting the probe. The tip of the probe will be placed into the back of your mouth. You'll be asked to swallow. Once the probe is in place, your provider will take pictures of your heart. The probe and bite block will be taken out after the test is done. The procedure may vary among providers and hospitals.  What can I expect after the procedure? You will be watched closely until you leave. This includes checking your blood pressure, heart rate, breathing rate, and blood oxygen level. Your throat may feel numb or sore. This will get better over time. You will not be allowed to eat or drink until the numbness has gone away. Ask when your test results will be ready and how to get them. You may need to call or meet with your provider to discuss your results.  This information is not intended to replace advice given to you by your health care provider. Make sure you discuss any questions you have with your health care provider.  Document Revised: 06/05/2023 Document Reviewed: 11/13/2022 Elsevier Patient Education  2024 [employer].

## 2024-09-02 NOTE — Transfer of Care (Signed)
 Immediate Anesthesia Transfer of Care Note  Patient: Monique Warren  Procedure(s) Performed: TRANSESOPHAGEAL ECHOCARDIOGRAM  Patient Location: Cath Lab  Anesthesia Type:MAC  Level of Consciousness: awake, alert , and oriented    Airway & Oxygen Therapy: Patient Spontanous Breathing and Patient connected to nasal cannula oxygen   Post-op Assessment: Report given to RN and Post -op Vital signs reviewed and stable  Post vital signs: Reviewed and stable  Last Vitals:  Vitals Value Taken Time  BP 128/60 09/02/24 09:24  Temp 37 0924  Pulse 99 09/02/24 09:24  Resp 20 09/02/24 09:24  SpO2 95 % 09/02/24 09:24  Vitals shown include unfiled device data.  Last Pain:  Vitals:   09/02/24 0732  TempSrc:   PainSc: 5          Complications: No notable events documented.

## 2024-09-02 NOTE — Anesthesia Preprocedure Evaluation (Signed)
 Anesthesia Evaluation  Patient identified by MRN, date of birth, ID band Patient awake    Reviewed: Allergy & Precautions, NPO status , Patient's Chart, lab work & pertinent test results  Airway Mallampati: II  TM Distance: >3 FB Neck ROM: Full    Dental  (+) Dental Advisory Given, Missing, Chipped,    Pulmonary asthma , sleep apnea and Continuous Positive Airway Pressure Ventilation , neg COPD, former smoker    + decreased breath sounds      Cardiovascular Exercise Tolerance: Good hypertension, Pt. on medications + Peripheral Vascular Disease and +CHF  + dysrhythmias + Valvular Problems/Murmurs  Rhythm:Regular Rate:Normal + Systolic murmurs . LV is severely hypoertrophied, more prominent than on echo from 2023.  Narrow LVOT. Turbulent flow through LV/LVOT WIth Valsalva maneuver, peak  gradient through LV/LVOT increases to 86 mm Hg (4.63 m/sec) Overall  consistent with HOCM      . Left ventricular ejection fraction, by estimation, is 65 to 70%. The  left ventricle has normal function. The left ventricle has no regional  wall motion abnormalities. There is severe asymmetric left ventricular  hypertrophy. Left ventricular  diastolic parameters are consistent with Grade I diastolic dysfunction  (impaired relaxation). Elevated left atrial pressure.   2. Right ventricular systolic function is normal. The right ventricular  size is normal.   3. The mitral valve is normal in structure. Mild mitral valve  regurgitation.   4. AV is thickened, probable trileaflet Difficult to see well Mildly  restricted motion. . The aortic valve was not well visualized. Aortic  valve regurgitation is not visualized. Aortic valve  sclerosis/calcification is present, without any evidence of  aortic stenosis.   5. The inferior vena cava is normal in size with greater than 50%  respiratory variability, suggesting right atrial pressure of 3 mmHg.      Neuro/Psych  Headaches PSYCHIATRIC DISORDERS Anxiety Depression    CRANIOTOMY FOR ANEURYSM / VERTEBROBASILAR / CAROTID CIRCULATION - 10/24/15 diabetic neuropathy. Vertigo  Neuromuscular disease    GI/Hepatic Neg liver ROS, hiatal hernia,GERD  Medicated and Controlled,,  Endo/Other  diabetes, Well Controlled, Type 2, Oral Hypoglycemic Agents    Renal/GU negative Renal ROS  negative genitourinary   Musculoskeletal  (+) Arthritis , Osteoarthritis,    Abdominal  (+) + obese  Peds negative pediatric ROS (+)  Hematology negative hematology ROS (+)   Anesthesia Other Findings Past Medical History: No date: Allergy No date: Anxiety No date: Arthritis No date: Asthma No date: Cataract     Comment:  Both eyes, but have been removed No date: Celiac disease No date: Depression No date: Diabetes mellitus, type II (HCC) No date: Diabetic neuropathy (HCC) No date: Diastolic dysfunction     Comment:  Grade 1 with preserved EF No date: Dysrhythmia No date: GERD (gastroesophageal reflux disease) No date: Heart murmur No date: Hiatal hernia     Comment:  Sliding No date: Hyperlipidemia No date: Hypertension No date: Migraine No date: Obstructive sleep apnea No date: Osteoporosis No date: Sleep apnea No date: Vertigo   Reproductive/Obstetrics negative OB ROS                              Anesthesia Physical Anesthesia Plan  ASA: 3  Anesthesia Plan: MAC   Post-op Pain Management: Minimal or no pain anticipated   Induction: Intravenous  PONV Risk Score and Plan: 2 and Propofol  infusion  Airway Management Planned: Nasal Cannula and Natural Airway  Additional Equipment: None  Intra-op Plan:   Post-operative Plan:   Informed Consent: I have reviewed the patients History and Physical, chart, labs and discussed the procedure including the risks, benefits and alternatives for the proposed anesthesia with the patient or authorized representative who  has indicated his/her understanding and acceptance.     Dental advisory given  Plan Discussed with: CRNA  Anesthesia Plan Comments: (Discussed risks of anesthesia with patient, including possibility of difficulty with spontaneous ventilation under anesthesia necessitating airway intervention, PONV, and rare risks such as cardiac or respiratory or neurological events, and allergic reactions. Discussed the role of CRNA in patient's perioperative care. Patient understands.)         Anesthesia Quick Evaluation

## 2024-09-02 NOTE — Procedures (Signed)
° ° °  TRANSESOPHAGEAL ECHOCARDIOGRAM   NAME:  Monique Warren    MRN: [id number] DOB:  1951-07-04    ADMIT DATE: 09/02/2024  INDICATIONS: MR  PROCEDURE:   Informed consent was obtained prior to the procedure. The risks, benefits and alternatives for the procedure were discussed and the patient comprehended these risks.  Risks include, but are not limited to, cough, sore throat, vomiting, nausea, somnolence, esophageal and stomach trauma or perforation, bleeding, low blood pressure, aspiration, pneumonia, infection, trauma to the teeth and death.    Procedural time out performed. The oropharynx was anesthetized with topical 1% benzocaine.    Anesthesia was administered by Dr. Boone and team.    The patient's heart rate, blood pressure, and oxygen saturation are monitored continuously during the procedure.    The transesophageal probe was inserted in the esophagus and stomach without difficulty and multiple views were obtained.   COMPLICATIONS:    There were no immediate complications.  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  Full report to follow. Further management per primary team.   Stanly Leavens, MD [place]  [facility]  10:21 AM

## 2024-09-02 NOTE — Progress Notes (Signed)
°  Echocardiogram Echocardiogram Transesophageal has been performed.  Monique Warren, RDCS 09/02/2024, 9:27 AM

## 2024-09-07 ENCOUNTER — Telehealth: Payer: Self-pay | Admitting: Internal Medicine

## 2024-09-07 NOTE — Telephone Encounter (Signed)
 Called patient to let her know her prolia  shot came in we will keep refrigerated for her, she said she is not due for this til May or June 2026.  It is in [place] office refrigerator.

## 2024-09-23 NOTE — Progress Notes (Addendum)
 Cardiothoracic Surgery Consult   Date of Service: 09/24/2024    Time: 2:11 PM   Referring Provider:  Okey Moccasin [name] , MD   Primary Care Physician:  Tobie Suzzane Massy, MD   Chief Complaint: HOCM/BAVS/MR  History of Present Illness: Monique Warren is a 74 y.o. female former-smoker with PMH significant for HTN, HLD, cerebral aneurysm s/p clip, asthma, syncope (remote) OSA(on CPAP), GERD, esophagitis, T2DM, anxiety, celiac disease, depression, HOCM and BAVS. She presents for evaluation of HOCM/BAVS/MR.  Pt has been followed by a local cardiologist for known bicuspid aortic valve and HOCM. She reports a 'heavy' feeling in her chest along with dizziness, and fatigue. She also had one remote episode of syncope last year. HOCM has been managed with diltiazem , which seemed to help some, but now seems less effective. She presents today for surgical evaluation.  The patient presents with symptoms of dyspnea, fatigue, and near-syncope. The symptoms are aggravated by attempting to perform usual household chores. Patient denies near-syncope and syncope.  Coronary artery disease risk factors include: advanced age (older than 46 for men, 54 for women), diabetes mellitus, dyslipidemia, hypertension, and smoking/ tobacco exposure. Patient's current NYHA Functional Heart Failure Classification is: III.  She is seen today in the clinic as a new patient.  Past Medical History:  Past Medical History:  Diagnosis Date   Anxiety    Aortic stenosis due to bicuspid aortic valve (HHS-HCC) 09/24/2024   Arthritis    Asthma (HHS-HCC)    Barrett's esophagus 09/27/2015   Benign paroxysmal positional vertigo 09/25/2019   Bicuspid aortic valve (HHS-HCC)    Carpal tunnel syndrome of right wrist 05/16/2021   Celiac disease (HHS-HCC)    Cerebral aneurysm (HHS-HCC)    CHF (congestive heart failure), NYHA class III, chronic, diastolic (CMS/HHS-HCC) 09/24/2024   Chronic fatigue 10/02/2022   Complex  sleep apnea syndrome 09/25/2019   Depression    Diabetes type 2, controlled (CMS/HHS-HCC) 09/16/2008   Diet controlled     Esophagitis    Gastroesophageal reflux disease 01/16/2022   GERD (gastroesophageal reflux disease)    Hiatal hernia    History of COVID-19 09/24/2024   HLD (hyperlipidemia)    HOCM (hypertrophic obstructive cardiomyopathy) (CMS/HHS-HCC)    HTN (hypertension)    Migraine without aura or status migrainosus 09/27/2015   Nonrheumatic mitral (valve) insufficiency 09/24/2024   OSA (obstructive sleep apnea)    Sliding hiatal hernia 1950/10/03   T2DM (type 2 diabetes mellitus) (CMS/HHS-HCC)    Vertigo       Past Surgical History:  Past Surgical History:  Procedure Laterality Date   ABDOMINAL HYSTERECTOMY     APPENDECTOMY     ARTHROPLASTY TOTAL KNEE Right    CATARACT EXTRACTION Bilateral    ENDOSCOPIC CARPAL TUNNEL RELEASE     KNEE ARTHROSCOPY Left      Family History:   Family History  Problem Relation Name Age of Onset   Breast cancer Mother     Stroke Mother     Thyroid disease Mother     Myocardial Infarction (Heart attack) Father     Hyperlipidemia (Elevated cholesterol) Father     High blood pressure (Hypertension) Father         Social History:   Social History   Socioeconomic History   Marital status: Married  Tobacco Use   Smoking status: Former    Types: Cigarettes   Smokeless tobacco: Never  Substance and Sexual Activity   Alcohol use: Yes    Alcohol/week: 6.0 - 12.0 standard drinks  of alcohol    Types: 6 - 12 Standard drinks or equivalent per week   Drug use: Never   Social Drivers of Corporate Investment Banker Strain: Low Risk (07/01/2024)   Received from [facility]   Overall Financial Resource Strain (CARDIA)    How hard is it for you to pay for the very basics like food, housing, medical care, and heating?: Not very hard  Food Insecurity: No Food Insecurity (07/01/2024)   Received from  [facility]   Hunger Vital Sign    Within the past 12 months, you worried that your food would run out before you got the money to buy more.: Never true    Within the past 12 months, the food you bought just didn't last and you didn't have money to get more.: Never true  Transportation Needs: No Transportation Needs (07/01/2024)   Received from [facility] - Transportation    In the past 12 months, has lack of transportation kept you from medical appointments or from getting medications?: No    In the past 12 months, has lack of transportation kept you from meetings, work, or from getting things needed for daily living?: No     Allergies  Allergen Reactions   Cefuroxime Anaphylaxis, Anxiety, Hives, Shortness Of Breath and Swelling    Throat almost closed off  Throat  Throat  Throat  Throat  Throat almost closed off   Codeine Anxiety, Hives, Nausea And Vomiting, Shortness Of Breath and Swelling    throat  Throat  Throat  Throat  throat   Sulfa (Sulfonamide Antibiotics) Hives, Itching, Shortness Of Breath and Swelling    Swollen throat  Throat  Throat     Medications: Prior to Admission medications  Medication Sig Taking? Last Dose  AIMOVIG  AUTOINJECTOR 140 mg/mL AtIn INJECT 1 PEN UNDER THE SKIN 1 TIME MONTHLY Yes Taking  albuterol  MDI, PROVENTIL , VENTOLIN , PROAIR , HFA 90 mcg/actuation inhaler USE 2 INHALATIONS ORALLY   EVERY 6 HOURS AS NEEDED FORWHEEZING OR SHORTNESS OF   BREATH Yes PRN Not Currently Taking  atorvastatin  (LIPITOR) 80 MG tablet Take 80 mg by mouth once daily Yes Taking  cholecalciferol  (VITAMIN D3) 5,000 unit capsule Take 5,000 Units by mouth once daily Yes Taking  clindamycin  (CLEOCIN ) 150 MG capsule Take 150 mg by mouth 3 (three) times daily Prior to dental work Yes PRN Not Currently Taking  denosumab  (PROLIA ) 60 mg/mL inj syringe Inject 60 mg subcutaneously Yes Taking  dilTIAZem  (CARDIZEM  SR) 120 MG SR capsule Take 240 mg by mouth once  daily Yes Taking  famotidine  (PEPCID ) 40 MG tablet Take 40 mg by mouth once daily Yes Taking  gabapentin  (NEURONTIN ) 300 MG capsule Take 300 mg by mouth at bedtime Yes Taking  linaCLOtide  (LINZESS ) 72 mcg capsule Take 72 mcg by mouth once daily Yes Taking  meclizine  (ANTIVERT ) 25 mg tablet Take 25 mg by mouth 3 (three) times daily as needed for Dizziness Yes PRN Not Currently Taking  methocarbamoL  (ROBAXIN ) 500 MG tablet Take 500 mg by mouth 4 (four) times daily Yes PRN Not Currently Taking  metoprolol  SUCCinate (TOPROL -XL) 25 MG XL tablet Take 25 mg by mouth at bedtime Yes Taking  promethazine  (PHENERGAN ) 12.5 MG tablet Take 12.5 mg by mouth Yes PRN Not Currently Taking  RABEprazole  (ACIPHEX ) 20 mg EC tablet Take 20 mg by mouth once daily Yes Taking  venlafaxine  (EFFEXOR ) 75 MG tablet Take 75 mg by mouth 2 (two) times daily Yes Taking  ZOLMitriptan (ZOMIG) 5 MG tablet Take 5 mg by mouth Yes PRN Not Currently Taking  atorvastatin  (LIPITOR) 40 MG tablet Take 40 mg by mouth at bedtime        Review of Systems:  Pertinent items in the HPI   STS Review of Systems:  Review of Systems (STS data):    Neuro:  Cerebrovascular Disease: No. Prior CVA: No.   if yes. none TIA: No. Carotid stenosis:  None. Prior Carotid surgery and/or Stenting: No.  Cardiovascular:  Heart Failure: Yes. History of MI: No. Arrhythmia: No. History of Cardiac Interventions: No. Family Hx Premature CAD: No. Hypertension: Yes. Hyperlipidemia: Yes. Syncope: No. Pre-Syncope: Yes. Endocarditis: No.  Respiratory:  Chronic Lung Disease: No. type: Home Oxygen: No.  if yes: None Sleep apnea: Yes. Pneumonia history: No. Inhaler use: Yes.  GI:  Liver Disease:No. History of GIB: No.  Renal:  Renal Insufficiency: No. Dialysis: No.  Endocrine:  Immunocompromised: No. Diabetes: Yes.  if yes, Oral.   Heme:  History of DVT: No. History of PE: No.  Refuses blood products:  No. Home antiplatelet or  anticoagulation use: No. History of thrombocytopenia or anemia: No.  Extremities:  Peripheral Artery Disease: No. History of varicose veins: No. Claudication symptoms: No.  Neuropathy: Yes.  Other:  Cancer within 5 years: No. Mediastinal Radiation: No.  Obesity: Body mass index is 33.91 kg/m.    Physical Exam: Vitals:   09/24/24 1255  BP: 117/61  Pulse: 80  Resp: 16   Ht:157.5 cm (5' 2) Wt:84.1 kg (185 lb 6.5 oz) BSA:@BSA   General appearance: alert, appears stated age, and cooperative Neurologic: Alert and oriented X 3, normal strength and tone. HEENT: Head: Normocephalic, atraumatic, without obvious abnormality. Neck: no JVD and trachea midline Lungs: clear to auscultation bilaterally and normal chest wall Heart: regular rate and rhythm and grade IV/VI systolic murmur present Abdomen: soft, non-tender; bowel sounds normal; no masses,  no organomegaly Extremities: extremities normal, atraumatic, no cyanosis or edema, no varicose veins or vein stripping scars Pulses: 2+ and symmetric Skin: Skin color, texture, turgor normal. No rashes or lesions  Data:      Imaging reviewed personally by Dr. Virgia.   TEE 09/02/2024 outside facility study images uploaded to [facility] image demonstrating severe asymmetric septal hypertrophy:     1. Severe LVOT Obstruction - LVOT Gradient 145 mm Hg (Valsalva). Maximal thickess 22 mm (DG images). Left ventricular ejection fraction, by estimation, is 70 to 75%. The left ventricle has hyperdynamic function. There is severe asymmetric left  ventricular hypertrophy of the septal segment.   2. Right ventricular systolic function is normal. The right ventricular size is normal.   3. Left atrial size was moderately dilated. No left atrial/left atrial appendage thrombus was detected.   4. Right atrial size was mild to moderately dilated.   5. Mitral regurgitation is SAM related. Abnormal thickening of the leaflet tips Moderate mitral  regurgitation with systolic blunting of the pulmonary vein signal. 3D MPR MVA 3.92 cm2. The mitral valve is abnormal. Moderate mitral valve regurgitation. No   evidence of mitral stenosis. The mean mitral valve gradient is 4.0 mmHg.   6. Siever's 1 Bicuspid Valve. The aortic valve is bicuspid. Aortic valve regurgitation is not visualized.   7. 3D performed of the mitral valve and demonstrates 3d MVA 3.92 cm2      AVA 1.90 cm2.   cMRI 06/16/2024: Outside facility study images on Power Share (order entered 09/24/2024 to push to [facility] Image  Viewer)  MRI image demonstrating the presence of septal hypertrophy:     Moderate LAE. Normal RA. No PFO/ASD. Lipomatous hypertrophy of the  atrial septum. Normal RV size and function. Normal TV/PV. Sievers  type 0 bicuspid AV. AVA by planimetry 1.8 cm2 consistent with mild  aortic valvular stenosis. No significant AR. Normal ascending  thoracic aorta 3.4 cm. Mitral valve is thickened with SAM Mild  appearing MR with regurgitant fraction 13% consistent with mild MR.  There is significant turbulence in the LVOT consistent with sub  valvular gradient. Suggest echo correlation. Severe septal  hypertrophy 22 mm compared to posterior wall 11 mm. Findings  consistent with hypertrophic cardiomyopathy   Quantitative LVEF: 61% (EDV 105 cc, ESV 41 cc SV 64 cc) Estimated  cardiac output using flow analysis 2.3 L/min Flow analysis curve  shows bifid waveform consistent with LVOT gradient   Quantitative RVEF: 67% (EDV 88 cc ESV 29 cc SV 59 cc )   Delayed enhancement images with gadolinium show no infarct,  infiltration, or scarring. Normal myocardial nulling Not consistent  with amyloid   Parametric Measures: Using Hct of 44 non current   T1: Mildly elevated 1031 msec   ZRC:Fpoiob elevated 34%   T2: Normal 48 msec   IMPRESSION:  1. Severe septal hypertrophy 22 mm with SAM and LVOT gradient.  Findings consistent with hypertrophic cardiomyopathy   2. Since  MRI done 01/14/22 myocardial mass has increased from 55g/m2  to 72g/m2   3. Despite severe thickening delayed enhancement with gadolinium  shows no uptake   4. Images not suggestive of amyloid with normal nulling of the  myocardium   5. Mild MR with SAM Regurgitant fraction 13%   6. Sievers type 0 bicuspid AV with no AR and mild AS AVA by  planimetry 1.8 cm2   7. Mildly elevated T1/ECV consistent with hypertrophic  cardiomyopathy Normal T2 suggesting no myocarditis   8. Decreased cardiac output 2.3 L/min with bifid waveform also  consistent with outflow tract gradient Suggest echo correlation   9.  Moderate LAE   10.  Lipomatous hypertrophy of atrial septum   11.  Normal RV size and function RVEF 67%    [facility] PFTs:  Please note reference set change as of August 25th 2020    Latest Ref Rng & Units 09/24/2024    1:44 PM  Pulmonary Function Test (PFT)  FVC Pre L 2.81  P  FVC_%PRED % 2.49  P   113 %  P  FVC_Z-SCORE  0.71  P  FEV1 Pre L 2.11  P  FEV1_%PRED % 1.94  P   109 %  P  FEV1/FVC Pre % 75.19  P  FEV1_Z-SCORE  0.50  P  FEF25-75% Pre L/s 1.64  P  FEF25-75%_%PRED % 1.62  P   101 %  P  FEF25-75%_Z-SCORE  0.03  P  TLC Pre L 4.62  P  TLC_%PRED % 100  P  RV Pre L 1.81  P  RV_%PRED % 97  P  DLCO Pre ml/(min*mmHg) 16.32  P    P Preliminary result      Assessment / Impression:  74 y.o. female who presents for evaluation of HOCM/BAVS/MR in the setting of NYHA Class III symptoms.   Plan:   Dr. Virgia and I have undertaken extensive discussion in the thoracic surgery clinic today with the patient and her husband to describe CT surgery review of diagnostic studies noted in detail above confirming severe hypertrophic obstructive cardiomyopathy with associated  NYHA III symptoms such that Dr. Virgia has recommended proceeding with surgical septal myectomy with possible mitral valve repair/replacement with goal of surgical intervention to provide symptom relief and  longevity benefit.  It is explained to the patient that the procedure is carried out through a standard median sternotomy approach and includes utilization of cardiopulmonary bypass technology.  Surgical scheduling will be coordinated through Dr. Chyrl office.     **Plan discussed with the patient and her husband today:  1. Continue your medical care with your established primary care provider and/or cardiologist.  2. Contact Dr. Chyrl office at Phone: [phone] and Fax: [phone] directly with any additional questions or concerns.  3.  Complete chest wall ECHO study today in Clinic 2K  4. After completing your ECHO study, then return to clinic to have bloodwork drawn.  5.  You may leave the [facility] campus after completing your heart echo and your lab work.  6.  Dr. Virgia will review your [facility] ECHO.  7.  Scheduling future surgery with Dr. Virgia will be arranged through Dr. Chyrl office.  8.  Dr. Chyrl office would arrange for you to be admitted to [facility] upon completion of [facility] Right + Left heart cath in preparation for surgical septal myectomy surgery.  9.  You have been provided preoperative chlorhexidine  skin wipes and associated instructions for use which will begin at the house 2 nights before date of surgery once this is scheduled through Dr. Chyrl office.  Getting completed today: -PFTs -TTE in 2K -Lab draw: thyroid function panel   TBA after right+ left heart cath (ordered)   CAD: aspirin : not presribed, beta blocker: currently taking, and statin/anti-lipid: currently taking   CHARLIE CHRISTELLA BREW, PA  Cardiothoracic Surgery  09/24/2024 2:11 PM    Addendum Created 09/29/2024   [facility] Chest Wall ECHO completed 09/24/2024: results reviewed and noted below:  CONCLUSION ------------------------------------------------------------------------------- NORMAL LEFT VENTRICULAR SYSTOLIC FUNCTION WITH SEVERE LVH ESTIMATED EF: >55%, CALC EF(2D):  65% ELEVATED LA PRESSURES WITH DIASTOLIC DYSFUNCTION (GRADE 2) HYPERCONTRACTILE RIGHT VENTRICULAR FUNCTION VALVULAR REGURGITATION: No AR, MODERATE MR, TRIVIAL PR, TRIVIAL TR                        NO VALVULAR STENOSIS                                                                        ------------------------------------------------------------------------------------------                                                                                           SAM with DYNAMIC LVOT GRADIENT AT REST ( , 5.70m/s) THAT INCREASES WITH VALSALVA     (166mmHg, 6.32m/s).  THE BASAL SEPTAL WALL MEASURES 1.9 CM                                                                                                                                               MODERATE MR                                                                                 POOR SOUND TRANSMISSION    NO PRIOR ECHO FOR COMPARISON                                                                                             ECHOCARDIOGRAPHIC DESCRIPTIONS ----------------------------------------------------------- AORTIC ROOT         Asc Ao Size: Normal                                Dissection: INDETERMINATE FOR DISSECTION                                                AORTIC VALVE            Leaflets: BICUSPID                                Mobility: Fully Mobile                     Morphology: MILDLY THICKENED                                                                           AR: No AR  AS: No AS                               AV Mass: No Masses                      LEFT VENTRICLE                Size: SMALL                                                                                     LVH: SEVERE LVH                            Contraction: Normal                                Closest EF: >55%                                    Calc. EF: 65%(2D)                             LV Mass: No Masses                          Dias. FxClass: RELAXATION ABNORMALITY (GRADE 2) CORRESPONDS TO PSEUDONORMAL                WALL MOTION                            Basal             Mid               Apical               Anterior Septum: Normal            Normal            Normal                 Anterior Wall: Normal            Normal            Normal                  Lateral Wall: Normal            Normal            Normal                Posterior Wall: Normal            Normal                                   Inferior Wall: Normal  Normal            Normal               Inferior Septum: Normal            Normal                               Rest Rest Score Index: 1.00     MITRAL VALVE            Leaflets: Normal                                  Mobility: Fully Mobile                     Morphology: Normal                                                                                     SAM: LEAFLET SYSTOLIC ANTERIOR MOTION                                  MR: MODERATE MR                                   MS: No MS                             MV masses: No Masses                      LEFT ATRIUM                Size: MILDLY ENLARGED                                                                     LA masses: No Masses                      MAIN PA                Size: Normal                                  Diameter: 2 cm                      PULMONIC VALVE            Leaflets: UNKNOWN  Mobility: Fully Mobile                     Morphology: Normal                                                            PR: TRIVIAL PR                                    PS: No PS                             PV masses: No Masses                      RIGHT VENTRICLE                 Size: SMALL                                  Free Wall: HYPERCONTRACTILE                Contraction: HYPERCONTRACTILE                                               TAPSE: 1.4 cm                                                     RV masses: No Masses                      TRICUSPID VALVE            Leaflets: Normal                                  Mobility: Fully Mobile                     Morphology: Normal                                        TR: TRIVIAL TR                                    TS: No TS                             TV masses: No Masses                      RIGHT ATRIUM  Size: SMALL                                  RA masses: No Masses                      PERICARDIUM               Fluid: No Effusion           INFERIOR VENA CAVA                Size: Not Seen                           Resp.Collapse: Not seen                                                                OTHER SPECIAL PROCEDURES -----------------------------------------------------------------             Contrast: Definity  SHOWS ENHANCED LV BORDERS                                      RESTING ECHOCARDIOGRAPHIC MEASUREMENTS --------------------------------------------------- AORTA Measurements            Values    Units     Normal Range                               Aorta Sin: 3         cm        [2.4 - 3.6]                                Asc.Aorta: 2.9       cm        [1.9 - 3.5]                           Asc. Aorta BSA: 1.6       cm/m2     [1.0 - 2.2]                     LEFT VENTRICLE                  LVIDd: 3.3       cm        [3.8 - 5.2]                                    LVIDs: 1.9       cm        [2.2 - 3.5]                                LVIDd/BSA: 1.8       cm/m2  SWT: 1.9       cm        [0.6 - 0.9]                                      PWT: 1.4       cm        [0.6 - 0.9]                             LV EF MOD BP: 65         %                                                LV EDV MOD BP: 99        mL                                                      LVEDVi: 53        mL/m2     [29 - 61]                              LV ESV MOD BP: 35        mL                                                      LVESVi: 19        mL/m2     [8 - 24]                        DIASTOLIC FUNCTION          MV Pk. E Vel.: 87        cm/s                                             MV Pk. A Vel.: 118.4     cm/s                                                    MV E/A: 0.7                                                                MV DT: 252       msec  MV Med E' Vel.: 2.4       cm/s                                               MV Med E/e': 36                                                         MV Lat E'Vel.: 3.7       cm/s                                               MV Lat E/e': 23.8                                                         MV Avg E/e': 29        cm/s                                      LEFT ATRIUM                LA Diam: 3.9       cm        [2.7 - 3.8]                                  LA Area: 24.4      cm2       [<= 20]                                    LA Volume: 68        ml        [18 - 58]                                       LAVi: 37        ml/m2     [16 - 34]                       RIGHT VENTRICLE               RV TAPSE: 1.4       cm                                                  RV S' Vel.: 8.2       cm/s  RV Base: 2.4       cm        [2.5 - 4.1]                                   RV Mid: 1.6       cm        [1.9 - 3.5]                     RIGHT ATRIUM                RA Area: 7.7       cm2       [ <= 20]                                   RA Volume: 14        mL                                                        RAVi: 7         mL/m2     [15 - 27]                       Pressures, Gradients, and  DOPPLER ECHO --------------------------------------------------- Aortic Valve              LVOT Diam: 2.2       cm                                                   LVOT Area: 3.7       cm2                                       Mitral Valve          MV Pk. E Vel.: 87        cm/s                                             MV Pk. A Vel.: 118.4     cm/s                                                    MV E/A: 0.7                                                     MV Inflow E Vel.: 87        cm/s  MV Annulus E'Vel.: 2.4       cm/s                                                 E/E'Ratio: [id number] [place], [place] 09/29/2024 2:35 PM

## 2024-10-01 ENCOUNTER — Ambulatory Visit: Attending: Internal Medicine | Admitting: Internal Medicine

## 2024-10-01 VITALS — BP 92/68 | HR 98 | Ht 62.0 in | Wt 186.7 lb

## 2024-10-01 DIAGNOSIS — I421 Obstructive hypertrophic cardiomyopathy: Secondary | ICD-10-CM

## 2024-10-01 DIAGNOSIS — R002 Palpitations: Secondary | ICD-10-CM

## 2024-10-01 DIAGNOSIS — I422 Other hypertrophic cardiomyopathy: Secondary | ICD-10-CM

## 2024-10-01 DIAGNOSIS — H8110 Benign paroxysmal vertigo, unspecified ear: Secondary | ICD-10-CM

## 2024-10-01 DIAGNOSIS — R079 Chest pain, unspecified: Secondary | ICD-10-CM

## 2024-10-01 MED ORDER — DILTIAZEM HCL ER 120 MG PO CP12: 240.0000 mg | ORAL_CAPSULE | Freq: Every day | ORAL | 3 refills | Status: AC

## 2024-10-01 MED ORDER — METOPROLOL SUCCINATE ER 25 MG PO TB24: 25.0000 mg | ORAL_TABLET | Freq: Every evening | ORAL | 3 refills | Status: AC

## 2024-10-01 NOTE — Patient Instructions (Signed)
 Medication Instructions:  Your physician recommends that you continue on your current medications as directed. Please refer to the Current Medication list given to you today.  *If you need a refill on your cardiac medications before your next appointment, please call your pharmacy*  Lab Work: NONE  If you have labs (blood work) drawn today and your tests are completely normal, you will receive your results only by: MyChart Message (if you have MyChart) OR A paper copy in the mail If you have any lab test that is abnormal or we need to change your treatment, we will call you to review the results.  Testing/Procedures: NONE   Follow-Up: At [facility], you and your health needs are our priority.  As part of our continuing mission to provide you with exceptional heart care, our providers are all part of one team.  This team includes your primary Cardiologist (physician) and Advanced Practice Providers or APPs (Physician Assistants and Nurse Practitioners) who all work together to provide you with the care you need, when you need it.  Your next appointment:   4 month(s)  Provider:   Orren Fabry, PA-C         Other Instructions

## 2024-10-01 NOTE — Progress Notes (Signed)
 " Cardiology Office Note:  .    Date:  10/01/2024  ID:  Monique Warren, DOB [birth year], MRN [id number] PCP: Tobie Suzzane POUR, MD  [facility] Providers Cardiologist:  Vina Gull, MD     CC: f/u prior to myectomy  History of Present Illness: .    Monique Warren is a 74 y.o. female with hypertrophic cardiomyopathy who presents for preoperative evaluation for a myectomy and potential valve replacement. She has been in discussions with Dr. Virgia regarding the surgical procedure.  A transesophageal echocardiogram was performed.  She wants to improve her quality of life through surgery, stating she is 'tired of feeling like crap all the time.' She prefers to minimize medication use and is considering the benefits of a bioprosthetic valve to avoid lifelong anticoagulation with Coumadin.  She has a bicuspid aortic valve, but there are no current plans for aortic valve replacement. She has previously undergone brain surgery at [facility].  Discussed the use of AI scribe software for clinical note transcription with the patient, who gave verbal consent to proceed.  Relevant histories: .  Social  - Employment: Company Secretary  - Living Situation: Lives in [place], [place] , retired there. - Has three children and grandchildren. 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 Family history of heart problems. - Daughter has deferred screening 2025: Established with me ROS: As per HPI.   Studies Reviewed: .     Cardiac Studies & Procedures   ______________________________________________________________________________________________   STRESS TESTS  MYOCARDIAL PERFUSION IMAGING 04/25/2017   ECHOCARDIOGRAM  ECHOCARDIOGRAM COMPLETE 04/06/2024  Narrative ECHOCARDIOGRAM REPORT    Patient Name:   Monique Warren Date of Exam: 04/06/2024 Medical Rec #:  [id number]         Height:       62.0 in Accession #:    [id number]        Weight:       190.8  lb Date of Birth:  Dec 03, 1950          BSA:          1.874 m Patient Age:    72 years          BP:           140/88 mmHg Patient Gender: F                 HR:           80 bpm. Exam Location:  [facility]  Procedure: 2D Echo, Cardiac Doppler and Color Doppler (Both Spectral and Color Flow Doppler were utilized during procedure).  Indications:    Murmur R01.1  History:        Patient has prior history of Echocardiogram examinations, most recent 10/11/2021. Risk Factors:Hypertension, Diabetes, Dyslipidemia and Sleep Apnea. Hx of COVID-19.  Sonographer:    Aida Pizza RCS Referring Phys: [id number] PAULA V ROSS  IMPRESSIONS   1. LV is severely hypoertrophied, more prominent than on echo from 2023. Narrow LVOT. Turbulent flow through LV/LVOT WIth Valsalva maneuver, peak gradient through LV/LVOT increases to 86 mm Hg (4.63 m/sec) Overall consistent with HOCM . Left ventricular ejection fraction, by estimation, is 65 to 70%. The left ventricle has normal function. The left ventricle has no regional wall motion abnormalities. There is severe asymmetric left ventricular hypertrophy. Left ventricular diastolic parameters are consistent with Grade I diastolic dysfunction (impaired relaxation). Elevated left atrial pressure. 2. Right ventricular systolic function is normal. The right ventricular size  is normal. 3. The mitral valve is normal in structure. Mild mitral valve regurgitation. 4. AV is thickened, probable trileaflet Difficult to see well Mildly restricted motion. . The aortic valve was not well visualized. Aortic valve regurgitation is not visualized. Aortic valve sclerosis/calcification is present, without any evidence of aortic stenosis. 5. The inferior vena cava is normal in size with greater than 50% respiratory variability, suggesting right atrial pressure of 3 mmHg.  Comparison(s): The left ventricular function is unchanged.  FINDINGS Left Ventricle: LV is severely hypoertrophied, more  prominent than on echo from 2023. Narrow LVOT. Turbulent flow through LV/LVOT WIth Valsalva maneuver, peak gradient through LV/LVOT increases to 86 mm Hg (4.63 m/sec) Overall consistent with HOCM. Left ventricular ejection fraction, by estimation, is 65 to 70%. The left ventricle has normal function. The left ventricle has no regional wall motion abnormalities. The left ventricular internal cavity size was small. There is severe asymmetric left ventricular hypertrophy. Left ventricular diastolic parameters are consistent with Grade I diastolic dysfunction (impaired relaxation). Elevated left atrial pressure.  Right Ventricle: The right ventricular size is normal. Right vetricular wall thickness was not assessed. Right ventricular systolic function is normal.  Left Atrium: Left atrial size was normal in size.  Right Atrium: Right atrial size was normal in size.  Pericardium: There is no evidence of pericardial effusion.  Mitral Valve: The mitral valve is normal in structure. Mild mitral valve regurgitation.  Tricuspid Valve: The tricuspid valve is grossly normal. Tricuspid valve regurgitation is trivial.  Aortic Valve: AV is thickened, probable trileaflet Difficult to see well Mildly restricted motion. The aortic valve was not well visualized. Aortic valve regurgitation is not visualized. Aortic valve sclerosis/calcification is present, without any evidence of aortic stenosis. Aortic valve mean gradient measures 30.0 mmHg. Aortic valve peak gradient measures 58.7 mmHg.  Pulmonic Valve: The pulmonic valve was not well visualized. Pulmonic valve regurgitation is not visualized. No evidence of pulmonic stenosis.  Aorta: The aortic root is normal in size and structure.  Venous: The inferior vena cava is normal in size with greater than 50% respiratory variability, suggesting right atrial pressure of 3 mmHg.  IAS/Shunts: No atrial level shunt detected by color flow Doppler.   LEFT  VENTRICLE PLAX 2D LVIDd:         3.10 cm   Diastology LVIDs:         2.30 cm   LV e' medial:    3.26 cm/s LV PW:         1.78 cm   LV E/e' medial:  24.3 LV IVS:        2.17 cm   LV e' lateral:   4.13 cm/s LVOT diam:     1.80 cm   LV E/e' lateral: 19.2 LVOT Area:     2.54 cm   RIGHT VENTRICLE RV S prime:     12.10 cm/s TAPSE (M-mode): 1.6 cm  LEFT ATRIUM             Index        RIGHT ATRIUM          Index LA diam:        3.50 cm 1.87 cm/m   RA Area:     8.61 cm LA Vol (A2C):   48.2 ml 25.72 ml/m  RA Volume:   15.80 ml 8.43 ml/m LA Vol (A4C):   44.9 ml 23.96 ml/m LA Biplane Vol: 47.8 ml 25.51 ml/m AORTIC VALVE AV Vmax:      383.00 cm/s  AV Vmean:     251.000 cm/s AV VTI:       0.737 m AV Peak Grad: 58.7 mmHg AV Mean Grad: 30.0 mmHg  AORTA Ao Root diam: 3.10 cm  MITRAL VALVE MV Area (PHT): 2.27 cm     SHUNTS MV Decel Time: 335 msec     Systemic Diam: 1.80 cm MV E velocity: 79.25 cm/s MV A velocity: 119.00 cm/s MV E/A ratio:  0.67  Vina Gull MD Electronically signed by Vina Gull MD Signature Date/Time: 04/06/2024/4:02:55 PM    Final   TEE  ECHO TEE 09/02/2024  Narrative TRANSESOPHOGEAL ECHO REPORT    Patient Name:   Monique Warren Date of Exam: 09/02/2024 Medical Rec #:  [id number]         Height:       62.0 in Accession #:    [id number]        Weight:       180.0 lb Date of Birth:  [birth year]          BSA:          1.828 m Patient Age:    73 years          BP:           119/60 mmHg Patient Gender: F                 HR:           104 bpm. Exam Location:  Inpatient  Procedure: Transesophageal Echo, Cardiac Doppler, Color Doppler, 3D Echo and Saline Contrast Bubble Study (Both Spectral and Color Flow Doppler were utilized during procedure).  Indications:     Bicupsid Aortic Valve HOCM  History:         Patient has prior history of Echocardiogram examinations, most recent 04/06/2024. HOCM, Aortic Valve Disease, Signs/Symptoms:Murmur and  Dizziness/Lightheadedness; Risk Factors:Hypertension and Diabetes.  Sonographer:     Koleen Popper RDCS Referring Phys:  [id number] [facility] A SANTO Diagnosing Phys: Stanly Santo MD  PROCEDURE: After discussion of the risks and benefits of a TEE, an informed consent was obtained from the patient. The transesophogeal probe was passed without difficulty through the esophogus of the patient. Imaged were obtained with the patient in a left lateral decubitus position. Sedation performed by different physician. The patient was monitored while under deep sedation. Anesthestetic sedation was provided intravenously by Anesthesiology: 292mg  of Propofol . Image quality was good. The patient's vital signs; including heart rate, blood pressure, and oxygen saturation; remained stable throughout the procedure. The patient developed no complications during the procedure.  IMPRESSIONS   1. Severe LVOT Obstruction - LVOT Gradient 145 mm Hg (Valsalva). Maximal thickess 22 mm (DG images). Left ventricular ejection fraction, by estimation, is 70 to 75%. The left ventricle has hyperdynamic function. There is severe asymmetric left ventricular hypertrophy of the septal segment. 2. Right ventricular systolic function is normal. The right ventricular size is normal. 3. Left atrial size was moderately dilated. No left atrial/left atrial appendage thrombus was detected. 4. Right atrial size was mild to moderately dilated. 5. Mitral regurgitation is SAM related. Abnormal thickening of the leaflet tips Moderate mitral regurgitation with systolic blunting of the pulmonary vein signal. 3D MPR MVA 3.92 cm2. The mitral valve is abnormal. Moderate mitral valve regurgitation. No evidence of mitral stenosis. The mean mitral valve gradient is 4.0 mmHg. 6. Siever's 1 Bicuspid Valve. The aortic valve is bicuspid. Aortic valve regurgitation is not visualized. 7. 3D performed of the mitral valve and demonstrates  3d MVA 3.92  cm2 AVA 1.90 cm2.  FINDINGS Left Ventricle: Severe LVOT Obstruction - LVOT Gradient 145 mm Hg (Valsalva). Maximal thickess 22 mm (DG images). Left ventricular ejection fraction, by estimation, is 70 to 75%. The left ventricle has hyperdynamic function. The left ventricular internal cavity size was normal in size. There is severe asymmetric left ventricular hypertrophy of the septal segment.  Right Ventricle: The right ventricular size is normal. No increase in right ventricular wall thickness. Right ventricular systolic function is normal.  Left Atrium: Left atrial size was moderately dilated. No left atrial/left atrial appendage thrombus was detected.  Right Atrium: Right atrial size was mild to moderately dilated.  Pericardium: There is no evidence of pericardial effusion.  Mitral Valve: Mitral regurgitation is SAM related. Abnormal thickening of the leaflet tips Moderate mitral regurgitation with systolic blunting of the pulmonary vein signal. 3D MPR MVA 3.92 cm2. The mitral valve is abnormal. Moderate mitral valve regurgitation. No evidence of mitral valve stenosis. The mean mitral valve gradient is 4.0 mmHg.  Tricuspid Valve: The tricuspid valve is normal in structure. Tricuspid valve regurgitation is not demonstrated.  Aortic Valve: Siever's 1 Bicuspid Valve. The aortic valve is bicuspid. Aortic valve regurgitation is not visualized. Aortic valve mean gradient measures 145.0 mmHg. Aortic valve peak gradient measures 249.0 mmHg.  Pulmonic Valve: The pulmonic valve was not well visualized. Pulmonic valve regurgitation is not visualized.  Aorta: The aortic root and ascending aorta are structurally normal, with no evidence of dilitation.  IAS/Shunts: The interatrial septum appears to be lipomatous. No atrial level shunt detected by color flow Doppler. Agitated saline contrast was given intravenously to evaluate for intracardiac shunting.  Additional Comments: Spectral Doppler  performed.  LEFT VENTRICLE PLAX 2D LVIDd:         4.40 cm LV PW:         1.60 cm LV IVS:        1.70 cm   AORTIC VALVE AV Vmax:      789.00 cm/s AV Vmean:     549.000 cm/s AV VTI:       1.730 m AV Peak Grad: 249.0 mmHg AV Mean Grad: 145.0 mmHg  MITRAL VALVE MV Mean grad: 4.0 mmHg  Stanly Leavens MD Electronically signed by Stanly Leavens MD Signature Date/Time: 09/02/2024/9:22:29 PM    Final  MONITORS  LONG TERM MONITOR (3-14 DAYS) 08/17/2024  Narrative   Patient had a minimum heart rate of 55 bpm, maximum heart rate of 169 bpm, and average heart rate of 75 bpm. Predominant underlying rhythm was sinus rhythm. Rare, P-SVT, lasting 8 beats at longest. Three pauses, 4 seconds at longest. Isolated PACs were rare (<1.0%). Isolated PVCs were rare (<1.0%). Three nocturnal, asymptomatic pauses Triggered and diary events associated with sinus rhythm.  No malignant arrhythmias of HCM.  Given pauses, unable to further titrate AV Warren therapy.   CT SCANS  CT CORONARY MORPH W/CTA COR W/SCORE 12/04/2021  Addendum 12/04/2021  2:41 PM ADDENDUM REPORT: 12/04/2021 14:38  EXAM: OVER-READ INTERPRETATION  CT CHEST  The following report is an over-read performed by radiologist Dr. Marcey Diones [facility] on 12/04/2021. This over-read does not include interpretation of cardiac or coronary anatomy or pathology. The coronary CTA interpretation by the cardiologist is attached.  COMPARISON:  None.  FINDINGS: No significant noncardiac vascular findings. Visualized mediastinum and hilar regions demonstrate no lymphadenopathy or masses. Hiatal hernia contains fat and some of the proximal stomach. Visualized lungs show no evidence of pulmonary edema, consolidation,  pneumothorax, nodule or pleural fluid. Visualized liver demonstrates evidence of steatosis. Visualized bony structures are unremarkable.  IMPRESSION: 1. Hiatal hernia. 2. Hepatic  steatosis.   Electronically Signed By: Marcey Moan M.D. On: 12/04/2021 14:38  Narrative CLINICAL DATA:  Chest pain  EXAM: Cardiac CTA  MEDICATIONS: Sub lingual nitro. 4mg  and lopressor  100mg  Cardizem  120 mg  TECHNIQUE: The patient was scanned on a Siemens Force 192 slice scanner. Gantry rotation speed was 250 msecs. Collimation was .6 mm. A 100 kV prospective scan was triggered in the ascending thoracic aorta at 140 HU's Full mA was used between 35% and 75% of the R-R interval. Average HR during the scan was 56 bpm. The 3D data set was interpreted on a dedicated work station using MPR, MIP and VRT modes. A total of 80 cc of contrast was used.  FINDINGS: Non-cardiac: See separate report from [facility]. No significant findings on limited lung and soft tissue windows.  Calcium  Score: No calcium  noted  Coronary Arteries: Right dominant with no anomalies  LM: Normal side by side LCX/LAD ostia  IM: Small vessel normal  LAD: Normal  D1: Normal  D2: Normal  D3: Normal  Circumflex: Normal  OM1: Normal  OM2: Normal  RCA: Normal  PDA: Normal  PLA: Normal  IMPRESSION: 1. Calcium  score 0  2.  CAD RADS 0 normal right dominant coronary arteries  3.  Normal ascending thoracic aorta 3.3 cm  Maude Emmer  Electronically Signed: By: Maude Emmer M.D. On: 12/04/2021 14:08   CARDIAC MRI  MR CARDIAC MORPHOLOGY W WO CONTRAST 06/16/2024  Narrative CLINICAL DATA:  Hypertrophic Cardiomyopathy  EXAM: CARDIAC MRI  TECHNIQUE: The patient was scanned on a 1.5 Tesla Siemens magnet. A dedicated cardiac coil was used. Functional imaging was done using Fiesta sequences. 2,3, and 4 chamber views were done to assess for RWMA's. Modified Simpson's rule using a short axis stack was used to calculate an ejection fraction on a dedicated work [employer]. The patient received 10 cc of Gadavist . After 10 minutes inversion recovery  sequences were used to assess for infiltration and scar tissue.  CONTRAST:  Gadavist   FINDINGS: Moderate LAE. Normal RA. No PFO/ASD. Lipomatous hypertrophy of the atrial septum. Normal RV size and function. Normal TV/PV. Sievers type 0 bicuspid AV. AVA by planimetry 1.8 cm2 consistent with mild aortic valvular stenosis. No significant AR. Normal ascending thoracic aorta 3.4 cm. Mitral valve is thickened with SAM Mild appearing MR with regurgitant fraction 13% consistent with mild MR. There is significant turbulence in the LVOT consistent with sub valvular gradient. Suggest echo correlation. Severe septal hypertrophy 22 mm compared to posterior wall 11 mm. Findings consistent with hypertrophic cardiomyopathy  Quantitative LVEF: 61% (EDV 105 cc, ESV 41 cc SV 64 cc) Estimated cardiac output using flow analysis 2.3 L/min Flow analysis curve shows bifid waveform consistent with LVOT gradient  Quantitative RVEF: 67% (EDV 88 cc ESV 29 cc SV 59 cc )  Delayed enhancement images with gadolinium show no infarct, infiltration, or scarring. Normal myocardial nulling Not consistent with amyloid  Parametric Measures: Using Hct of 44 non current  T1: Mildly elevated 1031 msec  ZRC:Fpoiob elevated 34%  T2: Normal 48 msec  IMPRESSION: 1. Severe septal hypertrophy 22 mm with SAM and LVOT gradient. Findings consistent with hypertrophic cardiomyopathy  2. Since MRI done 01/14/22 myocardial mass has increased from 55g/m2 to 72g/m2  3. Despite severe thickening delayed enhancement with gadolinium shows no uptake  4. Images not suggestive  of amyloid with normal nulling of the myocardium  5.  Mild MR with SAM Regurgitant fraction 13%  6. Sievers type 0 bicuspid AV with no AR and mild AS AVA by planimetry 1.8 cm2  7. Mildly elevated T1/ECV consistent with hypertrophic cardiomyopathy Normal T2 suggesting no myocarditis  8. Decreased cardiac output 2.3 L/min with bifid waveform  also consistent with outflow tract gradient Suggest echo correlation  9.  Moderate LAE  10.  Lipomatous hypertrophy of atrial septum  11.  Normal RV size and function RVEF 67%  Maude Emmer   Electronically Signed By: Maude Emmer M.D. On: 06/16/2024 17:07   ______________________________________________________________________________________________        Physical Exam:    VS:  BP 92/68 (BP Location: Right Arm)   Pulse 98   Ht 5' 2 (1.575 m)   Wt 186 lb 11.2 oz (84.7 kg)   SpO2 97%   BMI 34.15 kg/m    Wt Readings from Last 3 Encounters:  10/01/24 186 lb 11.2 oz (84.7 kg)  09/02/24 180 lb (81.6 kg)  08/10/24 185 lb 3.2 oz (84 kg)    Gen: no distress, Cardiac: No Rubs or Gallops, harsh systolic Murmur, RRR +2 radial pulses Respiratory: Clear to auscultation bilaterally, normal effort, normal  respiratory rate GI: Soft, nontender, non-distended  MS: No  edema;  moves all extremities Integument: Skin feels warm Neuro:  At time of evaluation, alert and oriented to person/place/time/situation  Psych: Normal affect, patient feels ok     ASSESSMENT AND PLAN: .    Hypertrophic Cardiomyopathy  - Reverse Curve Variant - peak gradient 145 mm Hg on Valsalva, Septal thickness 22 mm  - With Siever's 0 bicuspid valve and no AS, no AI but with Mixed mitral stenosis and regurgitation  - suspicion of Fabry's/Danon/Noonan's or other mimics of HCM: low - Gene variant: Deferred - NYHA III   - Non HCM Contributors to disease/status   Bicuspid Aortic Valve - Advised screening echocardiograms for her children to check for bicuspid aortic valve (can be done in [place] )   Hypertension - continue current AV Warren therapy for now   Obstructive Sleep Apnea She has obstructive sleep apnea, will need therapy at sometime  Family history, Discussed family screening  - offered her genetic testing; she has deferred clinical genetic testing visit   SCD  Assessment Risk of SCD  at 5 years 2.35 (%) Recommendation: Based on the absence of risk factors, this patient does not have an indication for an ICD (Class 3 - No Benefit)   Atrial fibrillation Assessment  - HCM-AF score 17 - Atrial arrhythmia management: Repeat assessment in 2028 unless post operative AF   Medication symptom plan Requiring surgical intervention. Discussed the benefits of myectomy to improve symptoms and reduce medication burden. Risks include low risk of death, potential need for pacemaker due to complete heart block, and 10-15% risk of postoperative atrial fibrillation. Shared decision-making focused on symptom improvement and quality of life. Surgery will not extend life expectancy but will improve symptoms and reduce medication burden. - Will proceed with myectomy as planned by Dr. Virgia; will ned bioprosthetic MVR but not AVR; if needed could get TAVR int he future - Will coordinate postoperative follow-up with Tessa or myself - Will arrange cardiac rehabilitation in [place] post-surgery.  Longitudinal care: The evaluation and management services provided today reflect the complexity inherent in caring for this patient, including the ongoing longitudinal relationship and management of multiple chronic conditions and/or the need for care  coordination. The visit required a comprehensive assessment and management plan tailored to the patient's unique needs Time was spent addressing not only the acute concerns but also the broader context of the patient's health, including preventive care, chronic disease management, and care coordination as appropriate.  Complex longitudinal is necessary for conditions including: complex HCM care  Stanly Leavens, MD FASE [facility] Cardiologist [facility]  [address] [place], [address] [place], [place] [address] [phone]  2:38 PM  "

## 2024-10-21 ENCOUNTER — Ambulatory Visit: Admitting: Internal Medicine

## 2024-11-09 ENCOUNTER — Ambulatory Visit: Admitting: Internal Medicine

## 2025-01-26 ENCOUNTER — Ambulatory Visit: Admitting: Physician Assistant

## 2025-02-08 ENCOUNTER — Ambulatory Visit: Admitting: Nurse Practitioner

## 2025-04-26 ENCOUNTER — Ambulatory Visit

## 2025-05-11 ENCOUNTER — Ambulatory Visit: Admitting: Rheumatology
